# Patient Record
Sex: Male | Born: 1940 | Race: White | Hispanic: No | Marital: Married | Smoking: Former smoker
Health system: Southern US, Community
[De-identification: ages and names within clinical notes are randomized; demographics above are authoritative.]

## PROBLEM LIST (undated history)

## (undated) DIAGNOSIS — H5440 Blindness, one eye, unspecified eye: Secondary | ICD-10-CM

## (undated) DIAGNOSIS — R32 Unspecified urinary incontinence: Secondary | ICD-10-CM

## (undated) DIAGNOSIS — I639 Cerebral infarction, unspecified: Secondary | ICD-10-CM

## (undated) DIAGNOSIS — H269 Unspecified cataract: Secondary | ICD-10-CM

## (undated) DIAGNOSIS — H3530 Unspecified macular degeneration: Secondary | ICD-10-CM

## (undated) DIAGNOSIS — I701 Atherosclerosis of renal artery: Secondary | ICD-10-CM

## (undated) DIAGNOSIS — K219 Gastro-esophageal reflux disease without esophagitis: Secondary | ICD-10-CM

## (undated) DIAGNOSIS — I10 Essential (primary) hypertension: Secondary | ICD-10-CM

## (undated) DIAGNOSIS — C61 Malignant neoplasm of prostate: Secondary | ICD-10-CM

## (undated) DIAGNOSIS — I739 Peripheral vascular disease, unspecified: Secondary | ICD-10-CM

## (undated) DIAGNOSIS — H409 Unspecified glaucoma: Secondary | ICD-10-CM

## (undated) DIAGNOSIS — N189 Chronic kidney disease, unspecified: Secondary | ICD-10-CM

## (undated) DIAGNOSIS — N209 Urinary calculus, unspecified: Secondary | ICD-10-CM

## (undated) DIAGNOSIS — R011 Cardiac murmur, unspecified: Secondary | ICD-10-CM

## (undated) DIAGNOSIS — J449 Chronic obstructive pulmonary disease, unspecified: Secondary | ICD-10-CM

## (undated) DIAGNOSIS — Z923 Personal history of irradiation: Secondary | ICD-10-CM

## (undated) DIAGNOSIS — I671 Cerebral aneurysm, nonruptured: Principal | ICD-10-CM

## (undated) DIAGNOSIS — K635 Polyp of colon: Secondary | ICD-10-CM

## (undated) DIAGNOSIS — Z8614 Personal history of Methicillin resistant Staphylococcus aureus infection: Secondary | ICD-10-CM

## (undated) DIAGNOSIS — E7800 Pure hypercholesterolemia, unspecified: Secondary | ICD-10-CM

## (undated) DIAGNOSIS — D649 Anemia, unspecified: Secondary | ICD-10-CM

## (undated) DIAGNOSIS — I6529 Occlusion and stenosis of unspecified carotid artery: Secondary | ICD-10-CM

## (undated) DIAGNOSIS — I700 Atherosclerosis of aorta: Secondary | ICD-10-CM

## (undated) DIAGNOSIS — M1990 Unspecified osteoarthritis, unspecified site: Secondary | ICD-10-CM

## (undated) DIAGNOSIS — I708 Atherosclerosis of other arteries: Secondary | ICD-10-CM

## (undated) HISTORY — DX: Cerebral aneurysm, nonruptured: I67.1

## (undated) HISTORY — PX: PENILE PROSTHESIS  REMOVAL: SHX2202

## (undated) HISTORY — DX: Personal history of irradiation: Z92.3

## (undated) HISTORY — DX: Occlusion and stenosis of unspecified carotid artery: I65.29

## (undated) HISTORY — DX: Unspecified cataract: H26.9

## (undated) HISTORY — DX: Essential (primary) hypertension: I10

## (undated) HISTORY — PX: COLONOSCOPY W/ BIOPSIES: SHX1374

## (undated) HISTORY — DX: Pure hypercholesterolemia, unspecified: E78.00

## (undated) HISTORY — DX: Atherosclerosis of aorta: I70.0

## (undated) HISTORY — PX: PROSTATE BIOPSY: SHX241

## (undated) HISTORY — DX: Chronic obstructive pulmonary disease, unspecified: J44.9

## (undated) HISTORY — PX: OTHER SURGICAL HISTORY: SHX169

## (undated) HISTORY — PX: HERNIA REPAIR: SHX51

## (undated) HISTORY — DX: Polyp of colon: K63.5

## (undated) HISTORY — DX: Malignant neoplasm of prostate: C61

## (undated) HISTORY — PX: PENILE PROSTHESIS IMPLANT: SHX240

## (undated) HISTORY — PX: COLONOSCOPY: SHX174

## (undated) HISTORY — DX: Atherosclerosis of other arteries: I70.8

---

## 1997-07-02 ENCOUNTER — Emergency Department (HOSPITAL_COMMUNITY): Admission: EM | Admit: 1997-07-02 | Discharge: 1997-07-02 | Payer: Self-pay | Admitting: Emergency Medicine

## 2001-02-26 ENCOUNTER — Ambulatory Visit (HOSPITAL_COMMUNITY): Admission: RE | Admit: 2001-02-26 | Discharge: 2001-02-26 | Payer: Self-pay | Admitting: General Surgery

## 2002-01-30 DIAGNOSIS — C61 Malignant neoplasm of prostate: Secondary | ICD-10-CM

## 2002-01-30 HISTORY — DX: Malignant neoplasm of prostate: C61

## 2002-05-20 ENCOUNTER — Ambulatory Visit (HOSPITAL_COMMUNITY): Admission: RE | Admit: 2002-05-20 | Discharge: 2002-05-20 | Payer: Self-pay | Admitting: *Deleted

## 2002-05-20 ENCOUNTER — Encounter (INDEPENDENT_AMBULATORY_CARE_PROVIDER_SITE_OTHER): Payer: Self-pay | Admitting: *Deleted

## 2002-06-20 ENCOUNTER — Ambulatory Visit (HOSPITAL_COMMUNITY): Admission: RE | Admit: 2002-06-20 | Discharge: 2002-06-20 | Payer: Self-pay | Admitting: Urology

## 2002-06-20 ENCOUNTER — Encounter: Payer: Self-pay | Admitting: Urology

## 2002-07-07 ENCOUNTER — Encounter: Payer: Self-pay | Admitting: Urology

## 2002-07-15 ENCOUNTER — Inpatient Hospital Stay (HOSPITAL_COMMUNITY): Admission: RE | Admit: 2002-07-15 | Discharge: 2002-07-18 | Payer: Self-pay | Admitting: Urology

## 2002-07-15 ENCOUNTER — Encounter (INDEPENDENT_AMBULATORY_CARE_PROVIDER_SITE_OTHER): Payer: Self-pay | Admitting: Specialist

## 2002-07-15 HISTORY — PX: OTHER SURGICAL HISTORY: SHX169

## 2002-08-13 DIAGNOSIS — K635 Polyp of colon: Secondary | ICD-10-CM

## 2002-08-13 HISTORY — DX: Polyp of colon: K63.5

## 2002-10-10 ENCOUNTER — Observation Stay (HOSPITAL_COMMUNITY): Admission: RE | Admit: 2002-10-10 | Discharge: 2002-10-11 | Payer: Self-pay | Admitting: Urology

## 2002-10-10 ENCOUNTER — Encounter (INDEPENDENT_AMBULATORY_CARE_PROVIDER_SITE_OTHER): Payer: Self-pay | Admitting: *Deleted

## 2003-02-13 ENCOUNTER — Observation Stay (HOSPITAL_COMMUNITY): Admission: RE | Admit: 2003-02-13 | Discharge: 2003-02-14 | Payer: Self-pay | Admitting: Urology

## 2003-04-10 ENCOUNTER — Observation Stay (HOSPITAL_COMMUNITY): Admission: RE | Admit: 2003-04-10 | Discharge: 2003-04-11 | Payer: Self-pay | Admitting: Urology

## 2003-04-13 ENCOUNTER — Inpatient Hospital Stay (HOSPITAL_COMMUNITY): Admission: EM | Admit: 2003-04-13 | Discharge: 2003-04-25 | Payer: Self-pay | Admitting: *Deleted

## 2003-04-14 ENCOUNTER — Encounter (INDEPENDENT_AMBULATORY_CARE_PROVIDER_SITE_OTHER): Payer: Self-pay | Admitting: Specialist

## 2003-11-24 ENCOUNTER — Observation Stay (HOSPITAL_COMMUNITY): Admission: AD | Admit: 2003-11-24 | Discharge: 2003-11-25 | Payer: Self-pay | Admitting: Urology

## 2003-11-24 ENCOUNTER — Ambulatory Visit (HOSPITAL_BASED_OUTPATIENT_CLINIC_OR_DEPARTMENT_OTHER): Admission: RE | Admit: 2003-11-24 | Discharge: 2003-11-24 | Payer: Self-pay | Admitting: Urology

## 2003-12-15 ENCOUNTER — Inpatient Hospital Stay (HOSPITAL_COMMUNITY): Admission: EM | Admit: 2003-12-15 | Discharge: 2003-12-27 | Payer: Self-pay | Admitting: Emergency Medicine

## 2003-12-28 ENCOUNTER — Encounter (HOSPITAL_BASED_OUTPATIENT_CLINIC_OR_DEPARTMENT_OTHER): Admission: RE | Admit: 2003-12-28 | Discharge: 2004-01-01 | Payer: Self-pay | Admitting: Internal Medicine

## 2004-05-10 ENCOUNTER — Ambulatory Visit (HOSPITAL_COMMUNITY): Admission: RE | Admit: 2004-05-10 | Discharge: 2004-05-10 | Payer: Self-pay | Admitting: Urology

## 2004-05-10 ENCOUNTER — Ambulatory Visit (HOSPITAL_BASED_OUTPATIENT_CLINIC_OR_DEPARTMENT_OTHER): Admission: RE | Admit: 2004-05-10 | Discharge: 2004-05-10 | Payer: Self-pay | Admitting: Urology

## 2004-06-15 ENCOUNTER — Ambulatory Visit (HOSPITAL_COMMUNITY): Admission: RE | Admit: 2004-06-15 | Discharge: 2004-06-15 | Payer: Self-pay | Admitting: Urology

## 2004-06-15 ENCOUNTER — Ambulatory Visit (HOSPITAL_BASED_OUTPATIENT_CLINIC_OR_DEPARTMENT_OTHER): Admission: RE | Admit: 2004-06-15 | Discharge: 2004-06-15 | Payer: Self-pay | Admitting: Urology

## 2004-08-12 ENCOUNTER — Ambulatory Visit: Payer: Self-pay | Admitting: Internal Medicine

## 2004-09-06 ENCOUNTER — Ambulatory Visit: Payer: Self-pay | Admitting: Internal Medicine

## 2004-09-27 ENCOUNTER — Ambulatory Visit: Payer: Self-pay | Admitting: Internal Medicine

## 2005-08-25 ENCOUNTER — Encounter (INDEPENDENT_AMBULATORY_CARE_PROVIDER_SITE_OTHER): Payer: Self-pay | Admitting: *Deleted

## 2005-08-25 ENCOUNTER — Ambulatory Visit (HOSPITAL_COMMUNITY): Admission: RE | Admit: 2005-08-25 | Discharge: 2005-08-25 | Payer: Self-pay | Admitting: *Deleted

## 2005-10-25 ENCOUNTER — Encounter (INDEPENDENT_AMBULATORY_CARE_PROVIDER_SITE_OTHER): Payer: Self-pay | Admitting: Specialist

## 2005-10-25 ENCOUNTER — Ambulatory Visit (HOSPITAL_COMMUNITY): Admission: RE | Admit: 2005-10-25 | Discharge: 2005-10-25 | Payer: Self-pay | Admitting: *Deleted

## 2006-04-26 ENCOUNTER — Ambulatory Visit: Payer: Self-pay | Admitting: *Deleted

## 2006-08-02 ENCOUNTER — Encounter (HOSPITAL_COMMUNITY): Admission: RE | Admit: 2006-08-02 | Discharge: 2006-10-08 | Payer: Self-pay | Admitting: Urology

## 2006-10-22 ENCOUNTER — Ambulatory Visit: Payer: Self-pay | Admitting: Vascular Surgery

## 2007-04-18 ENCOUNTER — Ambulatory Visit: Payer: Self-pay | Admitting: *Deleted

## 2007-06-06 ENCOUNTER — Ambulatory Visit: Payer: Self-pay | Admitting: *Deleted

## 2007-09-20 ENCOUNTER — Ambulatory Visit (HOSPITAL_COMMUNITY): Admission: RE | Admit: 2007-09-20 | Discharge: 2007-09-20 | Payer: Self-pay | Admitting: *Deleted

## 2009-08-09 ENCOUNTER — Ambulatory Visit (HOSPITAL_COMMUNITY): Admission: RE | Admit: 2009-08-09 | Discharge: 2009-08-09 | Payer: Self-pay | Admitting: Internal Medicine

## 2009-09-07 ENCOUNTER — Ambulatory Visit: Payer: Self-pay | Admitting: Vascular Surgery

## 2010-02-23 HISTORY — PX: TRANSTHORACIC ECHOCARDIOGRAM: SHX275

## 2010-06-14 NOTE — Procedures (Signed)
CAROTID DUPLEX EXAM   INDICATION:  Followup of known carotid artery disease.  Patient has  known right ICA occlusion and known low flow state in left ICA.  He is  asymptomatic at this time.   HISTORY:  Diabetes:  No.  Cardiac:  No.  Hypertension:  Yes.  Smoking:  No.  Previous Surgery:  No.  CV History:  Amaurosis Fugax No, Paresthesias No, Hemiparesis No                                       RIGHT             LEFT  Brachial systolic pressure:         120               126  Brachial Doppler waveforms:         WNL               WNL  Vertebral direction of flow:        Antegrade         Antegrade  DUPLEX VELOCITIES (cm/sec)  CCA peak systolic                   50                74  ECA peak systolic                   72                57  ICA peak systolic                   Occluded          23  ICA end diastolic                   Occluded          7  PLAQUE MORPHOLOGY:                  Occluded          Mixed  PLAQUE AMOUNT:                      Occluded          Moderate-severe  PLAQUE LOCATION:                    ICA               Bifurcation, ICA   IMPRESSION:  1. Known right ICA occlusion.  2. Stable left ICA with known low flow state and low diastolic flow      consistent with distal occlusive arterial disease.  3. Patent ECAs.  4. Bilateral antegrade flow in vertebral arteries.  5. Study essentially unchanged from that on 10/22/2006.   ___________________________________________  P. Drucie Opitz, M.D.   PB/MEDQ  D:  04/18/2007  T:  04/18/2007  Job:  [id number]   cc:   Freeman Caldron. Pauline Aus, M.D.

## 2010-06-14 NOTE — Assessment & Plan Note (Signed)
OFFICE VISIT   Lance Dillon, Lance Dillon  DOB:  1940/07/19                                       09/07/2009  CHART#:[id number]   The patient was referred by Dr. Alyson Ingles for evaluation lower extremity  claudication symptoms.  He has been seen in the past by Dr. Amedeo Plenty, as  recently as 2 years ago, at which time his ABI was 0.91 on the right,  0.76 on the left.  He was  having no significant symptoms at that time.  Currently he has developed calf claudication symptoms primarily on the  right side which occur at about one block.  He is able to walk several  blocks before stopping.  He has no numbness, history of infection,  nonhealing ulcers or rest pain.  He states it is not particularly  limiting him at the present time and has minimal symptoms in the left  leg.   CHRONIC MEDICAL PROBLEMS:  1. Hypertension.  2. Hyperlipidemia.  3. Prostate cancer.  4. Known chronic right ICA occlusion, asymptomatic.  5. Negative for coronary artery disease, diabetes or stroke.   SOCIAL HISTORY:  He is married, has 2 children.  Has not smoked  cigarettes since 2004, does not use alcohol.   FAMILY HISTORY:  Positive for coronary artery disease in his mother,  stroke in his father.  Negative for diabetes.   REVIEW OF SYSTEMS:  Positive for joint pain, muscle pain, decrease in  visual acuity.  No chest pain, productive cough, asthma, wheezing.  No  neurologic symptoms.  All other systems are negative.   PHYSICAL EXAMINATION:  Blood pressure 137/82, heart rate 81, temperature  98.  General:  He is a well-developed, well-nourished male in no  apparent distress, alert and oriented x3.  HEENT:  Exam normal for age.  EOMs intact.  Lungs:  Clear to auscultation.  No rhonchi or wheezing.  Cardiovascular:  Regular rhythm, no murmurs.  No carotid bruits are  heard.  Abdomen:  Soft, nontender with no masses.  Musculoskeletal:  Free of major deformities.  Lower extremity exam reveals 3+  femoral  pulses bilaterally with a 1 to 2+ popliteal on the left.  No distal  pulses.  Right leg has absent popliteal and distal pulses.   Lower extremity arterial Dopplers were done at [facility] on June 29.  I have reviewed those.  ABI is 0.53 on the right  and 0.74 on the left, in the claudication range.   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  He does not have a limb loss situation at the present  time and his left leg is relatively asymptomatic.  I do not think any  further treatment should be done and I have encouraged him to walk as  much as possible help stimulate collateral flow.  He does have a known  right ICA occlusion and has not had his left carotid looked at in a  couple of years so I have scheduled that for 1 year from now unless he  develops any neurologic symptoms in the interim.     Nelda Severe Kellie Simmering, M.D.  Electronically Signed   JDL/MEDQ  D:  09/07/2009  T:  09/08/2009  Job:  [id number]

## 2010-06-14 NOTE — Procedures (Signed)
CAROTID DUPLEX EXAM   INDICATION:  Followup of known cerebrovascular disease.  He has a known  right ICA occlusion, which was indicated in March 2008.  The left ICA  has a known low flow state with possible distal occlusive disease.  He  is asymptomatic at this time.   HISTORY:  Diabetes:  No.  Cardiac:  No.  Hypertension:  Yes.  Smoking:  Quit 4 years ago.  Previous Surgery:  No.  CV History:  Amaurosis Fugax:  No, Paresthesias: No, Hemiparesis:  No.   INTERPRETING:  Dr. Amedeo Plenty.                                        RIGHT             LEFT  Brachial systolic pressure:         122               127  Brachial Doppler waveforms:         Within normal limits                Within normal limits  Vertebral direction of flow:        Antegrade         Antegrade  DUPLEX VELOCITIES (cm/sec)  CCA peak systolic                   56                63  ECA peak systolic                   87                45  ICA peak systolic                   Occluded          18  ICA end diastolic                   Occluded          5  PLAQUE MORPHOLOGY:                  Occlusion         Mixed  PLAQUE AMOUNT:                      Occlusion         Moderate-severe  PLAQUE LOCATION:                    ICA               Bifurcation/ICA   IMPRESSION:  Known right occluded internal carotid artery.  Stable left  internal carotid artery with know low flow state as well as low end-  diastolic flow, consistent with distal occlusive disease.  Bilateral  vertebral arteries demonstrate antegrade flow.  Bilateral external  carotid arteries within normal limits.   ___________________________________________  P. Cameron Sprang, M.D.   AR/MEDQ  D:  10/22/2006  T:  10/23/2006  Job:  [id number]

## 2010-06-14 NOTE — Assessment & Plan Note (Signed)
OFFICE VISIT   ARN, TREIBER  DOB:  1940-02-04                                       06/06/2007  CHART#:[id number]   The patient is seen today because of an abnormal lower extremity  arterial Doppler.  His ankle brachial indices measured 0.91 on the  right, 0.76 on the left.   On specific questioning the patient denies any claudication type  symptoms.  He is able to walk as much as desired.  Does occasionally get  some stumbling and discomfort which he attributes to his back.  No  history of night pain or rest pain.   His BP is 148/80, pulse 89 per minute, respirations 18 per minute.  His  lower extremities on the right reveal 1 to 2+ dorsalis pedis and  posterior tibial.  Left revealed no palpable dorsalis pedis or posterior  tibial but a well-perfused foot.   The patient is essentially asymptomatic from a vascular point of view  regarding his peripheral vascular disease.  This is mild on the right  and mild to moderate on the left.  No further workup for intervention  required at this time.   Dorothea Glassman, M.D.  Electronically Signed   PGH/MEDQ  D:  06/06/2007  T:  06/07/2007  Job:  [id number]   cc:   Freeman Caldron. Pauline Aus, M.D.

## 2010-06-14 NOTE — Op Note (Signed)
Lance Dillon, Lance Dillon                ACCOUNT NO.:  [id number]   MEDICAL RECORD NO.:  [id number]          PATIENT TYPE:  AMB   LOCATION:  ENDO                         FACILITY:  [facility]   PHYSICIAN:  Waverly Ferrari, M.D.    DATE OF BIRTH:  1940-04-08   DATE OF PROCEDURE:  09/20/2007  DATE OF DISCHARGE:                               OPERATIVE REPORT   PROCEDURE:  Colonoscopy.   INDICATIONS:  Colon polyps.   ANESTHESIA:  Fentanyl 50 mcg, Versed 5 mg.   PROCEDURE IN DETAIL:  With the patient mildly sedated in the left  lateral decubitus position a rectal examination was performed.  Prostate  was not present.  Subsequently the Pentax videoscopic colonoscope was  inserted in the rectum and passed under direct vision to the cecum,  identified by ileocecal valve and appendiceal orifice, both of which  were photographed.  From this point the colonoscope was slowly withdrawn  taking circumferential views of colonic mucosa stopping only in the  rectum which appeared normal on direct and showed hemorrhoids on  retroflexed view.  The endoscope was straightened and withdrawn.  The  patient's vital signs, pulse oximeter remained stable.  The patient  tolerated the procedure well without apparent complications.   FINDINGS:  Internal hemorrhoids otherwise unremarkable examination.   PLAN:  Repeat examination in 5 years.           ______________________________  Waverly Ferrari, M.D.     GMO/MEDQ  D:  09/20/2007  T:  09/20/2007  Job:  [id number]

## 2010-06-17 NOTE — Discharge Summary (Signed)
   NAME:  Lance Dillon, Lance Dillon                          ACCOUNT NO.:  [id number]   MEDICAL RECORD NO.:  [id number]                   PATIENT TYPE:  INP   LOCATION:  [id number]                                 FACILITY:  [facility]   PHYSICIAN:  Domingo Pulse, M.D.               DATE OF BIRTH:  [birth year]   DATE OF ADMISSION:  07/15/2002  DATE OF DISCHARGE:  07/18/2002                                 DISCHARGE SUMMARY   DISCHARGE DIAGNOSES:  1. Carcinoma of prostate.  2. Chronic obstructive pulmonary disease.  3. Left inguinal hernia.  4. Hypertension.  5. Macular degeneration.  6. History of tobacco use.   HISTORY:  This 70 year old male developed an elevated PSA and was found to  have a Gleason score of 7, involving 78% of the prostate on the right and a  Gleason score of 7, involving 40% of the biopsy on the left.  He also was  found to have a left inguinal hernia.  CT scan and bone scan were negative.  Patient was advised as to the possible benefits of surgery versus radiation  and elected to undergo a radical prostatectomy; he was admitted following  that procedure.  At the time of surgery, he also underwent a left inguinal  repair through a retropubic approach.   HISTORIES:  The patient's family history, social history, and review of  systems are all carefully __________ in the initial history and physical.   HOSPITAL COURSE:  The patient's postoperative course was unremarkable.  He  tolerated the procedure without any difficulty.  His postoperative  hematocrit was 27.4.  Electrolytes were all normal.  He had very minimal J-P  drainage.  He had fairly prompt return of his abdominal function.  At the  time of discharge, the patient's J-P drain was pulled back, and that did  really very nicely stop the drainage.  He was sent home with the J-P drain  in place as well as a Foley catheter and will return to see me in the office  in one week's time for staple removal.  Additionally, he was sent  home with  pain medications as well as antibiotic coverage.                                               Domingo Pulse, M.D.    RJE/MEDQ  D:  07/30/2002  T:  07/30/2002  Job:  [id number]   cc:   Freeman Caldron. Pauline Aus, M.D.  [address] [place]  [place] [address]  Fax: [place] Melvyn Novas, M.D. [facility]

## 2010-06-17 NOTE — Op Note (Signed)
[facility]  Patient:    Lance Dillon, Lance Dillon Visit Number: [id number] MRN: [id number]          Service Type: DSU Location: DAY Attending Physician:  Maxwell Marion Dictated by:   Lew Dawes Rosana Hoes, M.D. Proc. Date: 02/26/01 Admit Date:  02/26/2001                             Operative Report  PREOPERATIVE DIAGNOSIS:  Left inguinal hernia.  POSTOPERATIVE DIAGNOSIS:  Left direct inguinal hernia.  PROCEDURE:  Repair of left direct inguinal hernia with mesh.  SURGEON:  Timothy E. Rosana Hoes, M.D.  ASSISTANT:  Mr. Hinton Rao.  ANESTHESIA:  General.  INDICATION:  Mr. Sistare is 40 healthy, hard working, has developed an increasingly symptomatic left inguinal hernia, and he wishes to have a repair after careful discussion.  DESCRIPTION OF PROCEDURE:  He was evaluated by anesthesia, taken to the operating room and placed supine, general endotracheal anesthesia administered.  The left groin had been shaved, and it was now prepped and draped in the usual fashion.  Marcaine 0.25% with epinephrine was injected around and about the area prior incision and dissection.  A curvilinear incision made over the left groin, subcutaneous tissue dissected, bleeding points cauterized, external oblique identified, dissected free, opened in line with its fibers through the external ring, care taken to reflect the ilioinguinal nerve medially.  The cord structures were dissected from the floor of the canal, skeletonized, and no indirect component was seen.  The direct hernia was large.  The floor was redundant and weak.  This was dissected from the cord structures.  A piece of Prolene mesh was cut and fashioned to fit the floor of the canal and sewn in place with a 2-0 running Prolene up to and around the internal ring.  This completed the repair.  The cord structures exited through the mesh and were otherwise anatomic.  They were re-placed in their normal position.  The  external oblique was closed with a running 2-0 Vicryl.  The deep subcu closed with 3-0 Monocryl and the skin closed with 3-0 Monocryl subcuticular.  Steri-Strips applied.  Dry sterile dressing applied.  Sponge, sharp, and instrument counts correct.  He tolerated it well and was removed to the recovery room.  Written and verbal instructions were given him and his wife, and he will be seen and followed as an outpatient. Dictated by:   Lew Dawes Rosana Hoes, M.D. Attending Physician:  Maxwell Marion DD:  02/26/01 TD:  02/26/01 Job: [id number]

## 2010-06-17 NOTE — Op Note (Signed)
Lance, Dillon                ACCOUNT NO.:  [id number]   MEDICAL RECORD NO.:  [id number]          PATIENT TYPE:  AMB   LOCATION:  [facility]                         FACILITY:  [facility]   PHYSICIAN:  Domingo Pulse, M.D.  DATE OF BIRTH:  07-05-1940   DATE OF PROCEDURE:  11/24/2003  DATE OF DISCHARGE:                                 OPERATIVE REPORT   PREOPERATIVE DIAGNOSIS:  Malfunction of penile prosthesis; urinary  incontinence, status post placement of an artificial urinary sphincter.   POSTOPERATIVE DIAGNOSIS:  Malfunction of penile prosthesis; urinary  incontinence, status post placement of an artificial urinary sphincter.   PROCEDURE:  1.  Revision of artificial urinary sphincter.  2.  Replacement of penile prosthesis reservoir.   ATTENDING SURGEON:  Alona Bene, MD   RESIDENT SURGEON:  Westley Foots, MD   ANESTHESIA:  General endotracheal anesthesia.   COMPLICATIONS:  None.   INDICATIONS FOR PROCEDURE:  [name] is a 70 year old male, status post  placement of a three-piece penile prosthesis in the recent past as well as  placement of an artificial urinary sphincter.  Following placement of his  prosthesis, the patient had a bowel herniation with incarcerated intestine  requiring reoperation.  At the time of reoperation, his reservoir was  removed.  In addition, the patient had an artificial urinary sphincter  placed; however, he has continued to leak intermittently.  [name]  presents for revision of his urinary sphincter as well as replacement of the  penile prosthesis reservoir.   DESCRIPTION OF THE PROCEDURE IN DETAIL:  The patient was brought to the  operating room and following induction of general endotracheal anesthesia,  was placed in the dorsal lithotomy position and prepped and draped in the  usual sterile fashion.  A lower midline abdominal incision was performed and  the dissection carried down into the space of Retzius.  Once the space of  Retzius had  been entered, the tubing from the previously placed prosthesis  reservoir was identified and dissected free of all surrounding tissues.  A  rubber-shodded hemostat was subsequently placed on the tubing and the cut  portion of the tubing from the previous surgery removed.  At this point, the  tubing from the previously placed artificial urinary sphincter was  identified in the retropubic space to the left of the prosthesis tubing.  This tubing was followed down until the pump for the artificial urinary  sphincter was identified and brought into the suprapubic incision.  At this  point in the operation, dissection was carried down on the left side of the  wound to the level of the fascia and the fascia opened.  A new pocket was  created in the left lower quadrant for placement of a new prosthesis  reservoir.  A 65 mL reservoir was subsequently placed into the space of  Retzius and the fascia closed over the reservoir using a running 2-0 Vicryl  suture.  At this point, the suprapubic wound was packed with a lap sponge,  and attention was turned to the perineum.  A midline perineal incision was  made and  dissection carried down to the level of the bulbar urethra and the  previously placed artificial urinary sphincter.  The sphincter was  identified and repositioned slightly more distal than where it had  originally been placed.  The urethra proximal to the old sphincter was  subsequently dissected away from the overlying corporal bodies, and a second  4 cm artificial urinary sphincter was placed around the bulbar urethra.  The  tubing from this new sphincter was tunneled up through the subcutaneous  tissues and brought out into the suprapubic wound.  A test of the urethra  was made by injecting sterile irrigation, retrograde through the urethra to  rule out any leaks.  This test was negative.  At this point, the fascia was  closed over the posterior urethra with a running 2-0 Vicryl suture, and  the  skin reapproximated with a running 2-0 Vicryl suture.  Attention was again  turned to the suprapubic incision.  The lap sponge was removed and all  tubing tested and trimmed for the final connections.  The new reservoir  tubing was connected to the previously placed tubing for the penile  prosthesis.  The two pieces of tubing coming from the two separate  artificial urinary sphincters were then connected to the sphincter pump via  a Y connector.  Once all tubing had been connected and both prosthetic  devices tested, the fascia in the suprapubic incision was reapproximated  using a running 2-0 Vicryl suture and the skin reapproximated using staples.  At this point, the wounds were dressed and the patient allowed to awaken.  The patient tolerated the procedure well, and there no complications.  Please note that Dr. Alona Bene was present for the entire case and  participated in all aspects of the procedure.      JP/MEDQ  D:  11/24/2003  T:  11/24/2003  Job:  [id number]

## 2010-06-17 NOTE — Discharge Summary (Signed)
Lance Dillon, Lance Dillon                ACCOUNT NO.:  [id number]   MEDICAL RECORD NO.:  [id number]          PATIENT TYPE:  INP   LOCATION:  [id number]                         FACILITY:  [facility]   PHYSICIAN:  Aquilla Hacker, M.D. DATE OF BIRTH:  08/29/1940   DATE OF ADMISSION:  12/15/2003  DATE OF DISCHARGE:  12/27/2003                                 DISCHARGE SUMMARY   FINAL DIAGNOSES:  1.  Sepsis secondary to infected penile prostheses and infected artificial      genitourinary sphincter.  2.  Abscess with MSSA.  3.  Urinary tract infection with MSSA and Enterococcus.   PROCEDURES:  Removal of infected penile prostheses and artificial  genitourinary sphincter.   CHIEF COMPLAINT:  Fever, nausea, and emesis.   HISTORY OF PRESENT ILLNESS:  Lance Dillon is a 70 year old gentleman with a  past medical history of hypertension, COPD, radical prostatectomy, and  penile prostheses placement.  He was seen earlier in the day by his primary  care physician.  He was referred to the emergency room after it was  discovered in his primary care physician's office that he was febrile and  had leukocytosis.  He had complained of nausea and emesis which began the  previous day.  He denied having any other complaints at the time of  admission.   PAST MEDICAL HISTORY:  1.  Hypertension.  2.  COPD.   PAST SURGICAL HISTORY:  1.  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  2.  Insertion of an AMS 800 artificial GO sphincter and 700 CX penile      prostheses in March 2005.  3.  Incarcerated right inguinal hernia repair April 14, 2003.  4.  Repair of malfunction of the penile prostheses, urinary incontinence,      placement of artificial urinary sphincter November 24, 2003.   FAMILY HISTORY:  Positive for hypertension, CVA in the father.   SOCIAL HISTORY:  Cigarettes positive.  The patient stopped one year ago.  Alcohol:  The patient denies.  Street drugs:  The patient denies.   HOSPITAL COURSE:  #1 - FEVER:  The patient was admitted into the hospital  and placed on broad-spectrum antibiotics.  Initially he had some decrease in  his fever.  However, his spikes went up as high as 102 degrees.  The patient  was evaluated by Dr. Amalia Hailey in the emergency room who stated that he noted  that the patient's scrotal area appeared to be warm, but not fluctuant and  initially it was difficult to tell whether or not the patient had a true  abscess.  However, the patient did appear to have pustules in his legs and  arms etiology of which was not clear.  A CT scan was obtained which showed  that the patient had a 59 x 32 mm fluid collection anterior to the left  pubic bone.  Gas bubbles are concerning for an abscess.  The fluid was noted  to extend into the left scrotum which may be infected or reactive fluid.  There was also noted that there was no bowel obstruction or  free fluid in  the pelvis.  The patient became more toxic appearing requiring admission  into the intensive care unit.  Urology decided that the patient needed to  have his device removed and the abscess needed to be drained and cultures  sent.  Urology went on to state that given the patient's overall degree of  illness in their opinion the best option would be to remove the patient's  device and treat his erectile dysfunction and incontinence at a later date.  The patient's operation took place on December 17, 2003.  It was noted that  by the day following the operation the patient appeared to be less toxic,  although he still remained febrile.  He was placed on Zosyn and IV  vancomycin over the course of his hospitalization.  Cultures from his  surgical wound were sent off to the laboratory and they came back positive  for gram-positive cocci in pairs.  No anaerobes were isolated.  Likewise,  the patient had blood cultures sent off all of which were negative.  On  October 15 he had also a urine culture sent off  which came back positive for  Staphylococcus aureus and also for Enterococcus both of which were both  sensitive to vancomycin.  The following day as the patient's clinical  condition appeared to improve, it was noted by November 20 that the  patient's wound site to be looking better and that there was less drainage  from his wound site.  However, by the 20th the patient's liver enzymes were  noted also to have started to increase.  There was a question as to whether  or not Zosyn contributed to that increase in the liver function tests.  Therefore, the decision was made to discontinue Zosyn.  On November 21  urology noted that the patient's wound appeared to be dirty.  All the drains  were removed by the time and the wound had to be reopened.  As the patient's  condition continued to improve urology recommended that the patient start  whirlpool once a day and by November 22 it was stated by urology that the  patient was okay to be discharged home.  There appeared to be a problem with  regards to arrange the whirlpool and patient ended up receiving pulse  lavage.  During the patient's hospitalization he was noted to have what  appeared to be a fungal infection within his groin/intertriginous area and  Diflucan 100 mg IV q.24h. was subsequently started.  Likewise, when the  Zosyn was discontinued clindamycin 300 mg IV q.8h. was initiated.  By the  time of discharge the patient's groin was noted to be doing well and it was  noted by the urologist that this healing/wound site was granulating well.  The second issue was diarrhea.  Later during the course of the patient's  hospitalization he complained of diarrhea.  There was some concern that  because the patient was on antibiotics he may have developed C. difficile.  Stool was sent off for studies and it was negative for C. difficile.  It was  also negative for white blood cells and the Gram stain was reported as being negative.  By the time  the patient was discharged he stated that his  diarrhea had resolved.   #2 - CHRONIC OBSTRUCTIVE PULMONARY DISEASE:  This remained stable throughout  the course of the patient's hospitalization.  He received breathing  treatments p.r.n., however.   #3 - MALNUTRITION:  It was noted  that the patient had very little appetite  throughout the course of his hospitalization.  He was encouraged to increase  his p.o. intake.   #4 - URINARY TRACT INFECTION:  Again, the patient's urine sample sent off on  November 15 was reported on November 18 to have grown Staph aureus and  Enterococcus species.  He was started on IV antibiotics and the day prior to  his discharge a repeat UA was sent to the laboratory for analysis the final  results of which were no wbc's were seen, no squamous epithelial cells seen,  no organisms were seen and that was the final report.  Likewise, the  patient's nitrites and leukocytes were negative.   The decision was made to discharge the patient home.  The patient's  condition at the time of discharge was significantly improved.  At the time  of his discharge he had no complaints and stated that he was ready to go  home.  His vitals at the time of discharge, his temperature was 98.0, heart  rate 82, respirations 16, blood pressure 129/91, SPO2 96 on room air.  His  genitourinary examination consisted of observing his suprapubic surgical  wound site which appeared to be clean.  There did not appear to be any  purulent material/discharge coming from that area.  Likewise, there was no  erythema present.  His wound site was packed in gauze, however.  Likewise,  his intertriginous region appeared to be clean and there was no obvious sign  of a yeast infection at that time.  Decision was made to discharge the  patient home.  The patient was discharged home on the following medications.   DISCHARGE MEDICATIONS:  1.  Xopenex 0.63 mg MDI two puffs q.6h.  2.  Levofloxacin 750 mg  p.o. daily.  3.  Multivitamin one tablet daily.  4.  Norvasc 5 mg one tablet daily.  5.  Diflucan 100 mg one tablet p.o. daily.  6.  Atrovent 0.5 mg MDI two puffs q.6h.  7.  Altace 10 mg one tablet p.o. daily.  8.  Ambien 10 mg one tablet p.o. q.h.s. p.r.n. for sleeping.  9.  Phenergan 12.5 mg one tablet p.o. q.6h.  10. Darvocet 100/650 one tablet p.o. q.4h. p.r.n. for pain.  11. Dilaudid 2 mg one tablet q.6h. p.r.n. for pain.   The patient was instructed to schedule an appointment to see Dr. Amalia Hailey on  Monday, December 28, 2003 for follow-up evaluation.  He was also instructed  to call to make this appointment.  In addition, he was instructed to  schedule an appointment to see his primary care physician, Dr. Concepcion Elk  within one to two weeks for follow-up evaluation.  He was told to take all  of his medications as prescribed, to keep his wound site clean, and to make  sure that he drinks plenty of fluids and liquids.     Orla  OR/MEDQ  D:  12/27/2003  T:  12/27/2003  Job:  [id number]   cc:   Freeman Caldron. Pauline Aus, M.D.  [address] [place]  [place] [address]  Fax: [phone]   Domingo Pulse, M.D.  [address] [place]  [place] [address]  Fax: [phone]

## 2010-06-17 NOTE — H&P (Signed)
Lance Dillon, Lance Dillon                ACCOUNT NO.:  [id number]   MEDICAL RECORD NO.:  [id number]          PATIENT TYPE:  INP   LOCATION:  0101                         FACILITY:  [facility]   PHYSICIAN:  Felicita Gage, MD     DATE OF BIRTH:  06/10/1940   DATE OF ADMISSION:  12/15/2003  DATE OF DISCHARGE:                                HISTORY & PHYSICAL   PRIMARY CARE PHYSICIAN:  Freeman Caldron. Pauline Aus, M.D.   CHIEF COMPLAINT:  Fever, nausea and vomiting X1 day.   HISTORY OF PRESENT ILLNESS:  The patient is a 70 year old Caucasian  gentleman transferred from his primary care physician's office after being  seen for fever, found to have leukocytosis.  The patient had nausea and  vomiting that started yesterday evening about 6 P.M.  One episode of  vomiting brown colored vomiting with no blood in the vomitus.  The patient  has had several surgeries this year starting with a cystoscopy and  transurethral resection of bladder neck contracture in January of 2005  followed by insertion of an AMS 800 artificial Geo sphincter and 700 CX  penile prosthesis in March of 2005.  The patient also had an incarcerated  right inguinal hernia repair on April 14, 2003 and was discharged on May 08, 2003.  The patient also had a urological surgical procedure on November 24, 2003 by Dr. Amalia Hailey for a malfunction of the penile prosthesis, urinary  continence, status post placement of artificial urinary sphincter.  The  patient denies any other symptoms other than the fever, nausea and vomiting.  He was concerned that it may be an infectious process secondary to his  multiple surgeries.  The patient had a follow up appointment with Dr. Amalia Hailey  later this week.   The patient did not have any episodes of vomiting this morning.  The patient  was able to tolerate p.o. medications in the emergency department.   PAST MEDICAL HISTORY:  1.  Hypertension.  2.  Chronic obstructive pulmonary disease.  3.  Status post radical  prostatectomy.  4.  Status post incarcerated hernia repair.  5.  Status post penile prosthesis placement.   FAMILY HISTORY:  Significant for hypertension, cerebrovascular accident in  father.   SOCIAL HISTORY:  Patient former smoker, quit one year ago.  No alcohol or  drug use.   MEDICATIONS:  1.  Caduet 10/20 one tab p.o. daily.  2.  Altace 10 mg p.o. daily.  3.  Fish oil.  4.  Multivitamin.  5.  Calcium supplement.   REVIEW OF SYMPTOMS:  Other than history of present illness the patient  denies any headaches, changes in vision, dysphagia, shortness of breath,  chest pain, abdominal pain, or any pain in his extremities.   PHYSICAL EXAMINATION:  VITAL SIGNS:  Temperature on admission 102.7,  subsequently 100.0, pulse 111, respirations 21, blood pressure 135/77.  GENERAL:  Elderly Caucasian man lying in bed in no acute distress.  Patient  was asleep, easily arousable.  Patient is alert and oriented X3.  Cooperative with the examination.  HEENT:  Normocephalic, atraumatic.  Pupils equal, round, reactive to light.  NECK:  Supple.  No lymphadenopathy, no jugular venous distention.  Oropharynx is noninflammatory.  LUNGS:  Clear to auscultation bilaterally.  No wheezes and no rales.  CVS:  S1, +S2, tachycardic.  No murmurs, rubs or gallops.  ABDOMEN:  Positive bowel sounds, soft, nontender, no masses, midline  surgical scar below the umbilicus, healing well.  GENITOURINARY:  Evidence of surgery on the scrotum.  No obvious signs of  infection along the surgical scar.  EXTREMITIES:  No cyanosis, clubbing or edema.  NEUROLOGICAL:  Nonfocal.   LABORATORY DATA:  Labs from Dr. Gertie Gowda office white blood cell count 19.6,  hemoglobin 12.8, hematocrit 36.4, platelet count 303,000 with neutrophils  87%, lymphs 5.3. BUN 26, creatinine 1.3, total bilirubin 1.6, alkaline  phosphatase 60, SGOT 27, SGPT 20.  Sodium 136, potassium 3.6, chloride 104,  cO2 27, calcium 9.1, total protein 6.1.    Chest x-ray is pending.   ASSESSMENT/PLAN:  70 year old Caucasian gentleman with history of multiple  genitourinary surgeries, transferred from primary care physician's office  with fever, leukocytosis, nausea and vomiting x1 day.   The patient will be admitted to the floor.   PROBLEM #1:  FEVER, LEUKOCYTOSIS:  Unclear etiology.  The patient will be  given Tylenol 650 mg p.o. q.4h. PRN fever.  The patient is also empirically  started on Zosyn and 3.375 mg intravenous q.6h.  (Patient is ALLERGIC TO  AVELOX).  CBC's will be repeated in the A.M.  Blood cultures X2 are pending.  Urinalysis is negative.  Urine cultures are pending.   PROBLEM #2:  HYPERTENSION:  The patient's Caduet and Altace have been  resumed.   PROBLEM #3:  HISTORY OF RECENT PROSTATE SURGERY:  Dr. Alona Bene of  Urology has been notified and will evaluate the patient this evening and  will give his recommendations.   PROBLEM #4:  NAUSEA AND VOMITING:  The patient is on Phenergan PRN for  nausea and vomiting.  He will be started on a clear liquid diet which will  be advanced as tolerated.   PROBLEM #5:  PROPHYLAXIS:  Lovenox 40 mg subcutaneously daily, Protonix 40  mg intravenously daily.   DISPOSITION:  After resolution of acute issues the patient will be  discharged with follow up with Dr. Concepcion Elk.      GDK/MEDQ  D:  12/15/2003  T:  12/15/2003  Job:  [id number]

## 2010-06-17 NOTE — Op Note (Signed)
NAMEERICSON, EBRON                ACCOUNT NO.:  [id number]   MEDICAL RECORD NO.:  [id number]          PATIENT TYPE:  INP   LOCATION:  0158                         [facility]AC[facility]  [facility]   PHYSICIAN:  Domingo Pulse, M.D.  DATE OF BIRTH:  13-May-1940   DATE OF PROCEDURE:  12/17/2003  DATE OF DISCHARGE:                                 OPERATIVE REPORT   PREOPERATIVE DIAGNOSIS:  Sepsis secondary to infected penile prosthesis and  infected artificial genitourinary sphincter.   POSTOPERATIVE DIAGNOSIS:  Sepsis secondary to infected penile prosthesis and  infected artificial genitourinary sphincter.   PROCEDURE:  Removal of infected penile prosthesis and artificial GU  sphincter.   SURGEON:  Domingo Pulse, M.D.   ASSISTANTMarland Kitchen  Hardin Negus.   ANESTHESIA:  General anesthesia.   COMPLICATIONS:  None.   DRAIN:  Multiple Penrose drains.   HISTORY:  This 70 year old male underwent radical prostatectomy with good  results.  The patient had 0 PSA.  It was noted preoperatively that the  patient had erectile dysfunction unresponsive to oral therapy and the long-  term plan was to insert a penile prosthesis.  When it was noted in the  postoperative period that the patient had some mild stress incontinence that  was somewhat bothersome to him, it was felt that he could undergo placement  of an artificial sphincter at the time of the implantation of the penile  prosthesis.   The patient underwent successful implantation of both devices through a  penis scrotal incision.  The patient had the reservoir for the artificial  sphincter on the left and the reservoir for the penile prosthesis on the  right.  He stayed in the hospital for 23 hours, was discharged the following  morning by the on call physician.  Over that weekend, the patient developed  some problems with nausea and presented to the office on the following  Monday.  He was found to have a bulge in the right groin.  This was thought  to  possibly be erosion of the reservoir through the inguinal ring.  The  patient was admitted to the hospital but began to feel worse.  A scan of  that area was performed and it was clear that this was not the reservoir but  was a strangulated hernia.  General surgery consultation was obtained and  the patient was taken to the operating room.   At the time of the surgery, it was clear that the patient had developed an  acutely strangulated hernia secondary to nausea and vomiting and needed to  have a bowel resection and removal of the reservoir for the IPP.  He  subsequently had the artificial sphincter activated which worked  [employer].   After complete resolution of that problem, the patient asked that the IPP be  turned back on and he was taken back to the operating room about two weeks  ago where he implantation of a new reservoir and at the same time had some  revision work to the artificial GU sphincter.  A second cuff was placed  around the urethra to improve his  continence control.   The patient received the usual preoperative and postoperative antibiotics  and appeared to have an uneventful course.  He went home and had very little  swelling and within a week to 10 days, was actually able to go out and play  golf without any complication, problems or pain.   Earlier this week, the patient presented to his primary care physician.  Dr.  Pauline Aus was unavailable so the patient was seen, in his absence, by Dr. Gareth Eagle.  The patient complained of the acute onset of fever.  He really did  not describe pain or swelling in the operative area but noted that he felt  quite ill and was febrile.  He was sent to the emergency room where he was  admitted by the Hospitalists.  I saw the patient in the emergency room and  felt that he perhaps had a little bit of swelling but really had a difficult  time getting the patient to not whether he had pain or not in the scrotal  area.  The area  was somewhat warm but not fluctuant and it was difficult to  determine if there was a true abscess in that area because the patient was  quite warm throughout his entire body.  What was remarkable on the  examination was the fact that he had pustules noted in the legs and arms  almost suggestive of an impetigo and it really was not at all clear to me  whether this was an infection in the device that spread throughout the body  or if perhaps the patient had developed an infection in the skin from some  other source that had been spread to the prostheses.   The patient was admitted and placed on  broad spectrum antibiotics.  He had  some decrease in his fever but still had temperature spikes as high as 102  degrees.  His white count was elevated at 14.5.  A CT scan was obtained  which showed gas in the tissue planes of the left side of the groin and it  was felt that the patient would require removal of the device.  This was  discussed with the patient and he reluctantly agreed.   The patient became more toxic appearing today and it is now felt that he  will require intensive care unit observation as well as a central line.  He  is to undergo removal of the device with drainage and culture.  He will not  have an attempt at salvage of the device which might be possible if there  was simple cellulitis but given his overall degree of illness, it is felt  that the best option is to remove the device and treat his erectile  dysfunction and incontinence at a later date.  The patient and his wife  understand the risks and benefits of the procedure and gave full improved  informed consent.   DESCRIPTION OF PROCEDURE:  After successful induction of general anesthesia,  the patient was placed in the dorsal lithotomy position, prepped with  Betadine and draped in the usual sterile fashion.  The lower abdominal incision was opened with electrocautery and frank purulent material was  obtained.  This  was cultured for aerobes and anaerobes.  The tubing from the  artificial GU sphincter was identified first.  This was traced back to the  reservoir which was then removed.  The connections to the two sphincters  were identified and the pump mechanism was  identified and elevated out of  the left hemiscrotum.  The tubing to the two cuffs in the perineum were  individually cut.  The penile prosthesis had been placed through a penile  scrotal incision so an incision was made on the underside to allow access to  the tubing.  Electrocautery was used to cut down on the tubing until the  corporal  bodies were identified.  The penile tubes and the rear tip  extenders were removed as was the pump.  The tubing up to the reservoir was  cut.  The tubing was then dissected out of the retroperitoneal and removed.  Attention was then directed to the perineum.  The two cuffs were identified.  The tablets were cut and the sphincters were removed.  The urethra had not  been injured in any way.  All three incisions were then carefully irrigated.  The skin was loosely approximated on each one with Vicryl sutures.  The  patient had Penrose drains placed in each of the reservoir spots  as well as in the pump areas and all the wounds were then packed with  Iodoform gauze.  The patient's Foley catheter was left to straight drain.  He was given scrotal support and fluffs.  He tolerated the procedure and was  taken to the recovery room in good condition.      RJE/MEDQ  D:  12/17/2003  T:  12/17/2003  Job:  [id number]   cc:   Freeman Caldron. Pauline Aus, M.D.  [address] [place]  [place] [address]  Fax: [place] Melvyn Novas, M.D. [facility]

## 2010-06-17 NOTE — Op Note (Signed)
NAMEANIS, Lance                ACCOUNT NO.:  [id number]   MEDICAL RECORD NO.:  [id number]          PATIENT TYPE:  AMB   LOCATION:  [facility]                         FACILITY:  [facility]   PHYSICIAN:  Domingo Pulse, M.D.  DATE OF BIRTH:  1940-09-23   DATE OF PROCEDURE:  DATE OF DISCHARGE:                                 OPERATIVE REPORT   PREOPERATIVE DIAGNOSES:  1.  Stress urinary incontinence.  2.  Status post radical retropubic prostatectomy.   POSTOPERATIVE DIAGNOSES:  1.  Stress urinary incontinence.  2.  Status post radical retropubic prostatectomy.   PROCEDURE:  1.  Cystoscopy.  2.  __________ injection.   SURGEON:  Domingo Pulse, M.D.   ANESTHESIA:  General.   COMPLICATIONS:  None.   DRAINS:  None.   BRIEF HISTORY:  This 70 year old male had a successful radical retropubic  prostatectomy insofar as cancer control is concerned, but did develop  increasing issues with stress incontinence.  In the early postoperative  period the patient had excellent control, but as time went on this got  worse.  Decision was eventually made to place penile prosthesis and an  artificial sphincter.  It should be noted that the patient had preoperative  erectile dysfunction.   The patient had an exceptionally complicated postoperative course.  Several  days postop he developed nausea and ended up developing an acute  incarcerated hernia.  This hernia involved the reservoir of the penile  prosthesis necessitating removal of the reservoir at the time of the  correction of the hernia.  The patient did strangulate and had a piece of  small bowel removed.  He fully recovered from this and eventually had a  reservoir replaced with what appeared to be good results.  The GU sphincter  had been left along and did function nicely during this period.   Several weeks postoperatively the patient developed an unusual skin rash  with purulent lesions across the legs and arms. He presented to the  emergency room with a fever and pain.  At first it did not appear that there  was any associated infection within the penile prosthesis, but as the  patient became more and more ill, it was clear that he likely had developed  a secondary infection in the prosthesis.  It turns out that this was quite  possibly MRSA and that the patient may have seated the prosthesis when he  developed this widespread infection in the legs and arms.  He had to have  both implants removed and was then successfully treated with antibiotic  therapy.  His incisions healed by second intent.   The patient has had a return of his stress incontinence.  He is clearly  concerned about this and he is also very worried about having a GU sphincter  placed.  He realizes that it could become infected either at the time of  surgery or in a delayed fashion as it occurred in the first place, and he is  very worried about the possible recurrence of the widespread staph  infection.  For that reason he has elected  to undergo a __________  implant.  He understands that there is no guarantee that this will make him dry and  that he may require additional injections.  He gave full, informed consent  for the procedure.   DESCRIPTION OF PROCEDURE:  After successful induction of general anesthesia  the patient was placed in the dorsal lithotomy position and prepped with  Betadine and draped in the usual sterile fashion.  Cystoscopy was performed  and urethra was visualized in its entirety and was found to be normal.  The  patient's sphincter mechanism is actually fairly reasonably intact with  fairly good coaptation of the mucosa.  The bladder was carefully inspected.  It was free of any tumor or stones.  Both ureters were normal in  configuration and location.  The patient was then injected with 2 __________  implants, 1 at the 3 o'clock position and 1 at the 9 o'clock position on  each side; 1.4 mL was injected for over 1 minute,  and then was  held in  place for 2 minutes allowing the implant to set up. The patient had little  if any drainage of the implant material.  The scope was withdrawn.  The  patient tolerated the procedure well and was taken to the recovery room in  good condition.      RJE/MEDQ  D:  05/10/2004  T:  05/10/2004  Job:  [id number]   cc:   Domingo Pulse, M.D.  [address] [place]  [place] [address]  Fax: [phone]

## 2010-06-17 NOTE — Op Note (Signed)
NAME:  ALYJAH, HIRSCHMAN                          ACCOUNT NO.:  [id number]   MEDICAL RECORD NO.:  [id number]                   PATIENT TYPE:  AMB   LOCATION:  DAY                                  FACILITY:  [facility]   PHYSICIAN:  Domingo Pulse, M.D.               DATE OF BIRTH:  [birth year]   DATE OF PROCEDURE:  10/10/2002  DATE OF DISCHARGE:                                 OPERATIVE REPORT   PREOPERATIVE DIAGNOSES:  1. Bladder neck contracture.  2. Status post radical retropubic prostatectomy.   POSTOPERATIVE DIAGNOSES:  1. Bladder neck contracture.  2. Status post radical retropubic prostatectomy.  3. 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   OPERATION/PROCEDURE:  1. Cystoscopy.  2. Transurethral resection of bladder tumor.  3. Transurethral resection bladder neck contracture.   SURGEON:  Domingo Pulse, M.D.   ANESTHESIA:  General.   COMPLICATIONS:  None.   DRAINS:  18-French Silastic Foley catheter.   BRIEF HISTORY:  This 70 year old male underwent radical retropubic  prostatectomy.  The patient had extensive disease preoperatively and for  that reason was not offered nerve-sparing option.  He was given a wide  excision in order to insure that all cancer was removed.  It should be noted  that the patient had no erections prior to the procedure.   The patient has had undetectable PSA since the procedure and initially did  quite well, but then he began to develop some problems where he felt like  could not empty his bladder completely.  He underwent evaluation in the  office and urethroscopy demonstrated a tight bladder neck contracture.  A  guide wire was passed through that bladder neck and the urethra was dilated  up to 24-French.  First the patient could urinate after the dilation but  then he began to develop problems with retention and to have the Foley  catheter reinserted.  The patient is now to undergo laser incision or TUR of  the bladder neck  contracture to hopefully eliminate some of this problems  with dysfunctional voiding and retention.  The patient is aware of the fact  that this might expose him to higher risk for incontinence.  He gave full  and informed consent.   DESCRIPTION OF PROCEDURE:  After the successful induction of general  anesthesia, the patient was placed in the dorsal lithotomy position, prepped  with Betadine and draped in the usual sterile fashion.  Cystoscopy was  performed.  The cystoscope was inserted and it was easily passed down the  urethra and negotiated to the bladder.  This was somewhat surprising since  the patient had only a pin hole opening at the time of the urethra dilation.  When the scope was inserted and the bladder neck was visualized, it was  clear that it was more wide open than it had been prior to the dilation.  It  was still slightly  fixed in that area and somewhat rigid but the torn  standard cystoscope sheath did pass through this without much difficulty.  Inspection with the 12-degree lens demonstrated a mucosal abnormality.  At  first this was thought to simply be a little irritation from the Foley  catheter, but it was clear that this mass represented a TCC.  The patient  does have a strong smoking history but has stopped smoking after the radical  prostatectomy.   The remainder of the bladder was inspected.  Trabeculation could be seen but  no other areas of suspicion for TCC for identified.  The cystoscope was  removed.  The resectoscope was inserted.  It was tight getting that sheath  through the bladder but it was negotiated through.  The area in question was  carefully resected.  Hemostasis was taken with the electrocautery.  All  chips were irrigated from the bladder.  A little bit of the scarred area at  the bladder neck was carefully resected but this was very minimal and mostly  done to just slightly open that area and to obtain some tissue for  pathologic evaluation  in order to look for recurrence.  The resection was  certainly not pulled back anywhere near the area of the external sphincter.  The operative sites were inspected and there was no bleeding. All chips had  been irrigated from the bladder.  The resectoscope was removed. There was an  excellent flow of irrigant with Crede maneuver and certainly there appears  to be no evidence of any residual obstruction.  The 18-French catheter was  inserted and irrigates normally.   The patient will be given 23-hour observation status although he will be  given the option to go home later today if stable.  He will be sent home on  Lorcet, Septra and Pyridium.                                                 Domingo Pulse, M.D.    RJE/MEDQ  D:  10/10/2002  T:  10/10/2002  Job:  [id number]   cc:   Freeman Caldron. Pauline Aus, M.D.  [address] [place]  [place] [address]  Fax: [place] Melvyn Novas, M.D. [facility]

## 2010-06-17 NOTE — Discharge Summary (Signed)
NAMEBRAE, Lance Dillon                ACCOUNT NO.:  [id number]   MEDICAL RECORD NO.:  [id number]          PATIENT TYPE:  INP   LOCATION:  [place]                         FACILITY:  [facility]   PHYSICIAN:  Jonna L. Gwynneth Aliment, M.D.DATE OF BIRTH:  [birth year]   DATE OF ADMISSION:  12/15/2003  DATE OF DISCHARGE:  12/23/2003                                 DISCHARGE SUMMARY   PRIMARY CARE PHYSICIAN:  Dr. Minna Antis.   UROLOGIST:  Dr. Amalia Hailey   Critical care medicine consultation.   DIAGNOSES:  1.  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  2.  Chronic obstructive pulmonary disease.  3.  Hypertension.  4.  Zosyn-induced hepatitis.  5.  Protein calorie malnutrition.  6.  Hypokalemia.  7.  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  8.  Cancer of the prostate.  9.  Intertriginous Candidiasis.   HISTORY:  The patient is a 70 year old white male, who developed 102 fever,  swelling in the scrotum, vomiting, nausea, difficulty with passing urine.  The patient had had a procedure on October 25 by Dr. Amalia Hailey for malfunction  of a penile prosthesis with urinary incontinence and placement of an  artificial urinary sphincter.  On October 25, he had revision of the urinary  sphincter, replacement of a reservoir.   Physician examination on admission showed a very swollen left scrotum,  tender, erythematous, left inguinal bulge, pustule in the left groin, and  also pustules on the skin as well.   HOSPITAL COURSE:  The patient was admitted, put on Zosyn, and vancomycin was  added.  CT scan was done to rule out Fournier's, and that was negative.  The  patient continued to remain febrile with nausea, chills, and increased  swelling and distention, began to develop pustules on the left thigh and  upper right limb which were probably septic emboli.  The patient was  transferred to ICU, increased his IV fluids, and the patient was brought to  the operating room where the  prosthesis and sphincter were removed.  At that  point, the patient's white count was 14.5.  He was still maintaining his  blood pressure.  By the following day, the patient was much improved,  afebrile, much less pain.  His blood pressures were holding much better.  He  was less tachycardic.  He had a mild hypokalemia which was replaced, and his  white count had dropped from 15 to 11.7.  Albumin was 2.5.  Culture was  growing out gram-positive cocci in pairs.  Urine culture showed MSSA and  Enterococcus.  Critical care signed off on November 18.  The patient had a  normal cortisol, lactose.  BNP was normal.  The area was packed with wet-to-  dry sterile saline dressings.  On November 20, it was noted that the patient  began to increase is liver function tests to about twice normal.  Zosyn was  stopped, and the patient was left just on vancomycin, and the liver function  tests after 48 hours began to drop.  The patient had persistent low alkaline  phosphatase, and he was put on supplementation, vitamins, and encouraged to  eat extra protein.  He was started on packing pulse hydrotherapy.  On  November 23, he developed some diarrhea, nausea, and vomiting which may have  been due to some of the narcotics but if the diarrhea continues, may need to  be checked for C. difficile.   DISPOSITION:   DISCHARGE MEDICATIONS:  1.  Altace 10 daily.  2.  Multivitamin daily.  3.  Vitamin C 500 daily.  4.  Zinc sulfate 220 daily.  5.  Protonix 40 daily.  6.  Norvasc 5 daily.  7.  Topical Nystatin cream to intertriginous Candidiasis.  8.  Augmentin 875 b.i.d.  9.  Pulse therapy 3 times daily.   Physical therapy.      JLB/MEDQ  D:  12/23/2003  T:  12/24/2003  Job:  [id number]

## 2010-06-17 NOTE — Op Note (Signed)
NAME:  GRYFFIN, MAGGART                          ACCOUNT NO.:  [id number]   MEDICAL RECORD NO.:  [id number]                   PATIENT TYPE:  INP   LOCATION:  [id number]                                 FACILITY:  [facility]   PHYSICIAN:  Darrelyn Hillock, M.D.         DATE OF BIRTH:  January 22, 1941   DATE OF PROCEDURE:  04/14/2003  DATE OF DISCHARGE:                                 OPERATIVE REPORT   PREOPERATIVE DIAGNOSIS:  Incarcerated right inguinal hernia.   POSTOPERATIVE DIAGNOSIS:  Incarcerated, strangulated right inguinal hernia.   PROCEDURE:  Right groin exploration and right inguinal hernia repair.  Removal of penile prosthesis reservoir by Dr. Amalia Hailey.  Exploratory  laparotomy.  Small bowel resection.   SURGEON:  Darrelyn Hillock, M.D.   ASSISTANT:  Domingo Pulse, M.D.   DESCRIPTION OF PROCEDURE:  Patient was taken to the operating room and  placed in a supine position.  After adequate general endotracheal anesthesia  was induced, the perineum and abdomen were prepped and draped in the normal  sterile fashion.  A right inguinal incision was made.  I dissected down to  the external oblique fascia.  This had a bluish hue to it, and it was  carefully opened.  I immediately identified infarcted bowel with a small  microperforation without obvious contamination or succus.  The penile  reservoir was in the internal ring, which is where the bowel was herniated  through.  I am reducing the reservoir.  The bowel contents were able to be  reduced into the abdominal cavity.  The large internal ring was then closed  by approximating the inguinal ligament at the transversalis fascia with  multiple interrupted #1 Novofil sutures.  The external oblique fascia was  then closed with a running 3-0 Vicryl, and skin was closed loosely with  staples.   A lower vertical midline incision was made.  The peritoneum was entered.  A  significant amount of proximal dilated small bowel was identified at  the  area of strangulation.  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  This segment measured  about 8 inches long and was removed using GIA stapling device.  A side-to-  side enteroenterostomy was made in the standard fashion using a GIA and PA  stapling device.  Mesentery was closed with 2-0 silk sutures.  The abdomen  was copiously irrigated.  An NG tube was positioned and in good placement.  The fascia was closed with running #1 Novofil, and the skin was closed  loosely with staples.  The patient tolerated the procedure well and went to  the PACU in good condition.                                               Darrelyn Hillock, M.D.   KRH/MEDQ  D:  04/15/2003  T:  04/15/2003  Job:  [id number]   cc:   Domingo Pulse, M.D.  [address] [place]  [place] [address]  Fax: [phone]

## 2010-06-17 NOTE — Op Note (Signed)
NAME:  Lance Dillon, Lance Dillon                          ACCOUNT NO.:  [id number]   MEDICAL RECORD NO.:  [id number]                   PATIENT TYPE:  AMB   LOCATION:  DAY                                  [facility]ACILITY:  [facility]   PHYSICIAN:  Domingo Pulse, M.D.               DATE OF BIRTH:  1940-06-23   DATE OF PROCEDURE:  04/10/2003  DATE OF DISCHARGE:                                 OPERATIVE REPORT   PREOPERATIVE DIAGNOSES:  1. Stress urinary incontinence.  2. Organic erectile dysfunction.   POSTOPERATIVE DIAGNOSES:  1. Stress urinary incontinence.  2. Organic erectile dysfunction.   OPERATION/PROCEDURE:  1. Insertion of AMS 800 artificial GU sphincter.  2. Insertion of AMS 700 CX penile prosthesis.   SURGEON:  Domingo Pulse, M.D.   ANESTHESIA:  General.   COMPLICATIONS:  None.   DRAINS:  16-French Foley catheter per urethra.   BRIEF HISTORY:  This is a 70 year old male who initially presented to my  office for evaluation of erectile dysfunction.  The patient was found to  have an elevated PSA so he underwent a biopsy and then underwent a radical  prostatectomy.  The patient is considered cancer-free at this point.  However, redeveloped a bladder neck contracture that required opening on an  outpatient basis.  The patient now has free flow of urine but, of course,  has developed urinary incontinence.  The patient had planned all along to  have a penile prosthesis placed once he has recovered from this prostate  surgery.  He is now going to undergo a combined procedure with the insertion  of the penile prosthesis via an insertion of the artificial sphincter.  He  understands the risks and benefits of the procedure and gave full informed  consent.  The patient has received appropriate preoperative instructions in  terms of risks and benefits of the procedure.  He has received preoperative  antibiotics including Ancef and gentamicin.  He had a Hibiclens shower  preoperatively and will  receive a full 10 minutes scrub with Betadine scrub  and paint.   DESCRIPTION OF PROCEDURE:  After successful induction of general anesthesia,  the patient was placed in the supine position.  He received a full 10-minute  scrub and paint.  The drapes were applied and a Vi-Drape was placed over the  entire operative field.  A small hole was made in the Vi-Drape, bringing the  penis and scrotum out on to the field.  A Foley catheter was inserted.  This  passed easily with no evidence whatsoever of any obstruction.  The bladder  was drained.  The Wilson retractor was used to elevate the penis in the  usual fashion.  A transverse high scrotal incision was made and retractors  were used to expose the operative field.   Dissection proceeded down to the urethra and then out laterally to the  corporeal body on each side.  Corporotomies were made bilaterally and  holding sutures of 2-0 Vicryl were placed.  Additional sutures were  preplaced in preparation for the closure.  The corporeal body was dilated on  each side up to 14 with the [facility] dilators.  The penis was sized and it  appeared that a 22 cm device would be appropriate.  The 18 cm device with 4  cm rear-tip was then prepared in the usual fashion.  The device was inserted  on each side using the [facility] inserter tool and the device was implanted.  It appeared that it seated perfectly with no wrinkling at the corporotomy  site and excellent filling of the glans.  The preplaced sutures were then  tied down.  The area had been irrigated.  We visualized the cycle one more  time and it appeared to be in excellent position.   Attention was then directed to the urethra.  Posterior dissection exposed  the urethra and the corporeal bodies in the more proximal position.  The  urethra was elevated and a window was made behind the urethra.  A __________  was passed behind the urethra and the sizing tape was passed behind the  urethra.  A size of 3.5  cm was determined.  The sphincter was then passed  behind the urethra and the device was snapped in place.  A right-angle clamp  could be passed between the urethra and the cuff, indicating that it was not  excessively tight.  The Wilson retractor was then taken down and attention  was directed to the right inguinal area.  With blunt dissection, an opening  was made through the transversalis fascia on the right-hand side and the  reservoir for the penile prosthesis was placed.  A total of 65 cm was placed  within that reservoir and there was no back pressure.  This was connected to  the pump tubing and pump was then placed down in the right hemiscrotum.  In  the left inguinal area, a small opening was made in the transversalis fascia  and the 7180 reservoir was inserted into that opening.  The reservoir was  then connected to the pump with a straight connector and the pump was  connected to the cuff with an angle connection.  The entire area was  irrigated.  The penile prosthesis was pumped up one more time and was  checked and appeared to function normally.  The GU sphincter was then  deactivated and remained deactivated, indicating that all connections have  been made appropriately.  The area was irrigated one more time.  The  incision was then closed in multiple layers, separating the components of  the artificial sphincter from the penile prosthesis as much as possible.  The pump from the GU sphincter was deep within the left hemiscrotum and the  pump for the penile prosthesis was deep within the right hemiscrotum.  The  skin was then closed with a running suture of 3-0 Prolene.  The patient had  a Tegaderm applied.  Coban was placed around the penis.  The Foley catheter  was left in place.  The patient had a pressure dressing applied, bringing  the penis up on the lower abdomen.  The patient also had ice applied and a  scrotal support.   The patient tolerated the procedure well and  was taken to the recovery room  in good condition.  Domingo Pulse, M.D.    RJE/MEDQ  D:  04/10/2003  T:  04/10/2003  Job:  [id number]   cc:   Freeman Caldron. Pauline Aus, M.D.  [address] [place]  [place] [address]  Fax: [phone]

## 2010-06-17 NOTE — Op Note (Signed)
Lance Dillon, Lance Dillon                ACCOUNT NO.:  [id number]   MEDICAL RECORD NO.:  [id number]          PATIENT TYPE:  AMB   LOCATION:  [facility]                         FACILITY:  [facility]   PHYSICIAN:  Domingo Pulse, M.D.  DATE OF BIRTH:  August 10, 1940   DATE OF PROCEDURE:  06/15/2004  DATE OF DISCHARGE:                                 OPERATIVE REPORT   SERVICE:  Urology.   PREOPERATIVE DIAGNOSES:  Stress urinary incontinence.   POSTOPERATIVE DIAGNOSES:  Stress urinary incontinence.   PROCEDURE:  Cystoscopy, Tegress injection and Coaptite injection.   SURGEON:  Domingo Pulse, M.D.   ANESTHESIA:  General.   COMPLICATIONS:  None.   DRAINS:  None.   BRIEF HISTORY:  This 70 year old male has stress urinary incontinence. The  patient had a radical prostatectomy with no evidence of any residual cancer.  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 After the bladder neck contracture was opened, he has had  stress incontinence. He had a GU sphincter placed which worked very nicely  for him but at the same time had a penile prosthesis inserted. The patient  developed some problems with postoperative nausea and when he was coughing  and straining developed a hernia. This was not picked up over the next few  days and when the patient finally presented to the office it was clear he  had a strangulated hernia. This necessitated removal of some of the  hardware. The patient subsequently had that put back in and this worked  until he developed a problem with a postoperative infection. The infection  source appeared to be from the skin as he was covered by a miliary type of  infection with multiple small areas on both the upper and lower extremities.  This appeared to be consistent with possible MRSA but certainly was some  form a staph infection. The patient has had obvious incontinence since the  sphincter was removed. He has healed up nicely from all  of his incisions but  he is very very reluctant to consider replacement of the GU sphincter  because of this problem with infections. The patient has had one Tegress  injection and he does appear to have had at least some improvement but  clearly has a long way to go until he will be dry. He gave full and informed  consent.   DESCRIPTION OF PROCEDURE:  After successful induction of general anesthesia,  the patient was placed in the dorsal lithotomy position, prepped with  Betadine and draped in the usual sterile fashion. Cystoscopy was performed  and the urethra was visualized in its entirety and was found to be normal.  The patient did have what appeared to be a pretty good Tegress implant on  the left hand side, the right side hand of the implant just did not appear  to take as well. We attempted to inject the Coaptite product from bard but  this really did not inject particularly well, it was very difficult to go  and one of the syringes just would not empty since  this material had  actually coalesced within the syringe. It did not appear that we had much  success with that so the patient was subsequently injected with the Tegress.  He had a series of injections around the bladder neck and within that  prostate area with two fully syringes. In each case, a half syringe was  injected at each site and this was done over one minute and was held in  place for a minute in order to allow this to set up. Obviously this will  continue to expand and we hope that the patient may get some control from  this. It did appear, however, at the end despite the fact that there was  some definite improvement, I do not believe that he will really have  adequate continence and it is likely he is going to have to consider having  either another sphincter or a male sling inserted. We will certainly not do  this anytime soon. We are going to wait and see how he does with this. We  will give the patient  doxicycline for his skin lesions and have him return  to see [place] in followup in 3-4 weeks.     _______________    [id number]  D:  06/15/2004  T:  06/15/2004  Job:  [id number]

## 2010-06-17 NOTE — Consult Note (Signed)
NAME:  Lance Dillon, Lance Dillon                          ACCOUNT NO.:  [id number]   MEDICAL RECORD NO.:  [id number]                   PATIENT TYPE:  INP   LOCATION:  [id number]                                 FACILITY:  [facility]   PHYSICIAN:  Darrelyn Hillock, M.D.         DATE OF BIRTH:  [birth year]   DATE OF CONSULTATION:  04/14/2003  DATE OF DISCHARGE:                                   CONSULTATION   REASON FOR CONSULTATION:  Incarcerated right inguinal hernia.   REFERRING PHYSICIAN:  Dr. Alona Bene.   HISTORY OF PRESENT ILLNESS:  The patient is a 70 year old white male with a  history of impotence, urinary incontinence, and prostate cancer.  The  patient underwent radical prostatectomy in the summer of last year.  He  presented last week for placement of a prostatic sphincter and penile  prosthesis.  After being discharged home, the patient developed some nausea,  vomiting, and coughing and was noted to have some swelling in the right  groin.  He was admitted yesterday, NG tube was placed but the patient's  symptoms continued to worsen.  CT scan revealed the presence of incarcerated  right inguinal hernia.   PAST MEDICAL HISTORY:  Significant for hypertension.   PAST SURGICAL HISTORY:  As above.   PHYSICAL EXAMINATION:  GENERAL:  He is an age-appropriate white male and  looking acutely ill in the preop holding suite.  He is pale.  NG tube is  placed.  GENERAL:  Heart rate is 95, blood pressure is 118/70, respiratory rate 16.  HEENT:  Benign.  Normocephalic, atraumatic.  Pupils are reactive and equal.  LUNGS:  Clear to auscultation and percussion x2.  HEART:  Regular rate and rhythm without murmurs, rubs, or gallops.  ABDOMEN:  Slightly firm and distended.  He has a warm, tender right inguinal  hernia.  GENITOURINARY:  A well-healed scrotal incision.  The penis is in an erectile  state.  EXTREMITIES:  No clubbing, cyanosis, or edema.   IMPRESSION:  Incarcerated probable strangulated  right inguinal hernia.   PLAN:  Right groin exploration with hernia repair and possible laparotomy  with bowel resection.  I discussed this at length with the patient and his  wife and they understand the probability that this will require exploratory  laparotomy and bowel resection.  They wish to proceed.                                               Darrelyn Hillock, M.D.    KRH/MEDQ  D:  04/15/2003  T:  04/16/2003  Job:  [id number]

## 2010-06-17 NOTE — H&P (Signed)
NAME:  Lance Dillon, Lance Dillon                          ACCOUNT NO.:  [id number]   MEDICAL RECORD NO.:  [id number]                   PATIENT TYPE:  INP   LOCATION:  [id number]                                 FACILITY:  [facility]   PHYSICIAN:  Domingo Pulse, M.D.               DATE OF BIRTH:  1940/09/15   DATE OF ADMISSION:  04/13/2003  DATE OF DISCHARGE:                                HISTORY & PHYSICAL   ADMISSION DIAGNOSES:  1. Dehydration.  2. Intractable nausea.  3. Small bowel obstruction versus postoperative ileus.   HISTORY:  This 70 year old male is status post radical prostatectomy.  The  patient had originally presented to the office with erectile dysfunction. As  part of the dilation, was found to have an elevated PSA which lead to the  detection of his cancer.  The patient is now felt to be cancer-free.  He did  develop a problem with a bladder neck contracture but had that successfully  treated.  The patient has always wanted a penile prosthesis inserted and  because he had developed some problems with incontinence after the bladder  neck contracture was treated, he underwent combined surgery for placement of  a penile prosthesis and an artificial GU sphincter.   His procedure was performed on March 11.  On postoperative day #1, he ate a  normal breakfast and was discharged by Dr. Jeffie Pollock.  The patient subsequently  developed nausea over the next few hours and was given Phenergan to try and  stop his nausea.  The patient informed me that the nausea became intractable  on Saturday and Sunday.  It was thought that this might be due to pain  medications so we discontinued all pain medication, but he had significant  retching through the next 24 hours.  The patient presented to the office  early in the morning on April 13, 2003, where he clearly had an ileus or  possible small bowel obstruction.  During the initial evaluation, we noted  the patient did have some swelling of the right groin  of undetermined  etiology.  Given the severity of the patient's nausea, it is possible that  this could be a new hernia, or possible extrusion of the patient's reservoir  due to excessive intra-abdominal pressure during his vomiting or, of course,  it could be a small hematoma.  The patient is being admitted for IV fluids,  dehydration, and additional antibiotic therapy because he is so recently  passed prosthetic surgery.   PAST MEDICAL HISTORY:  1. Remarkable, of course, for the recent radical prostatectomy.  2. He is known to have hypertension.  3. Mild COPD.   MEDICATIONS:  1. Zocor 20 mg daily.  2. Altace 10 mg daily.  3. Norvasc 10 mg daily.  4. Fish oil.  5. Multivitamins for the eyes.  6. Centrum Silver.  7. Calcium.  8. He had some recent __________ interactions for  which he was started     amoxicillin.   When the patient left the hospital after his surgery for the artificial  sphincter and penile prosthesis, he was sent home with Keflex and pain  medication, both of which he discontinued when he began to have  such  problems with his nausea.   PAST SURGICAL HISTORY:  1. Teeth extraction on April 06, 2003.  2. Bladder neck resection on February 13, 2003.  3. Radical prostatectomy was done last year in June.  4. He had a left inguinal hernia repair done in 2003 as well as additional     repair done at the time of the radial prostatectomy.  5. He had a left a ganglion cyst removed many years ago.   SOCIAL HISTORY:  The patient's social history is unremarkable.  He does not  use alcohol.  He stopped smoking in June 2004 when he underwent surgery for  his prostate cancer.   Family history and review of systems are noncontributory.   PHYSICAL EXAMINATION:  VITAL SIGNS:  Temperature 98.1, pulse 76,  respirations 16, blood pressure 150/90.  GENERAL:  The patient is a well-developed, but very dehydration male.  HEENT:  He had clearcut changes in mucosal surfaces.  His  eyes look  shrunken.  His mouth is very dry and parched.  Otherwise unremarkable.  Cranial nerves II-XII appear grossly intact.  LUNGS:  His respiratory effort showed occasional rale but no rales and  nothing suggestive of congestive failure.  The patient is known to have  required an incentive spirometer following his recent surgery and was sent  with instructions how to use that device.  HEART:  Regular rate and rhythm.  No murmurs, thrills, gallops, rubs,  heaves.  ABDOMEN:  Distended with diminished bowel sounds.  GENITALIA:  There is some postoperative swelling but actually no significant  hematoma, no drainage from the incision at the penoscrotal junction.  The  patient, as noted above, did have some unexplained swelling in the right  groin.  The penis looked fine with minimal bruising.  EXTREMITIES:  No cyanosis, clubbing or edema.   IMPRESSION:  1. Dehydration.  2. Small bowel obstruction.   PLAN:  Admit for IV fluid, NG, as well as supportive therapy.                                               Domingo Pulse, M.D.    RJE/MEDQ  D:  04/14/2003  T:  04/14/2003  Job:  [id number]

## 2010-06-17 NOTE — Op Note (Signed)
Lance Dillon, Lance Dillon                ACCOUNT NO.:  [id number]   MEDICAL RECORD NO.:  [id number]          PATIENT TYPE:  AMB   LOCATION:  ENDO                         FACILITY:  [facility]   PHYSICIAN:  Waverly Ferrari, M.D.    DATE OF BIRTH:  [birth year]   DATE OF PROCEDURE:  10/25/2005  DATE OF DISCHARGE:                                 OPERATIVE REPORT   PROCEDURE:  Colonoscopy with polypectomy.   INDICATIONS:  Colon polyps.   ANESTHESIA:  Fentanyl 70 mcg, Versed 5 mg.   PROCEDURE:  With the patient mildly sedated in the left lateral decubitus  position, a rectal examination was performed which was unremarkable.  The  prostate was not palpable. Subsequently, the Olympus videoscopic colonoscope  was inserted in the rectum and passed under direct vision with pressure  applied to the abdomen and we reached the cecum identified by the ileocecal  valve and base of the cecum, both of which were photographed. From this  point, the colonoscope was slowly withdrawn taking circumferential views of  the colonic mucosa stopping at approximately 20 cm from the anal verge at  which point a polyp that had been previously seen and biopsied was  photographed and removed using snare cautery technique at a setting of  20/200 blended current.  The tissue was suctioned into the scope and  retrieved for pathology.  The endoscope was then withdrawn to the rectum  which appeared normal on direct and showed hemorrhoids on retroflexed view.  The endoscope was straightened and withdrawn.  The patient's vital signs and  pulse oximeter remained stable.  The patient tolerated the procedure well  without apparent complications.   FINDINGS:  Small polyp 20 cm from the anal verge removed by snare cautery  technique, otherwise, unremarkable colonoscopic examination to the cecum.   PLAN:  Await biopsy report of the entire polyp.  The patient will call me  for results and follow-up with me as needed as an  outpatient.           ______________________________  Waverly Ferrari, M.D.     GMO/MEDQ  D:  10/25/2005  T:  10/26/2005  Job:  [id number]   cc:   Alona Bene, M.D.

## 2010-06-17 NOTE — Op Note (Signed)
NAME:  Lance Dillon, Lance Dillon                          ACCOUNT NO.:  [id number]   MEDICAL RECORD NO.:  [id number]                   PATIENT TYPE:  AMB   LOCATION:  DAY                                  [facility]ACILITY:  [facility]   PHYSICIAN:  Domingo Pulse, M.D.               DATE OF BIRTH:  1940/11/02   DATE OF PROCEDURE:  02/13/2003  DATE OF DISCHARGE:                                 OPERATIVE REPORT   PREOPERATIVE DIAGNOSIS:  Bladder neck contracture, status post radical  retropubic prostatectomy.   POSTOPERATIVE DIAGNOSIS:  Bladder neck contracture, status post radical  retropubic prostatectomy.   OPERATION/PROCEDURE:  1. Cystoscopy.  2. Transurethral resection bladder neck contracture.   SURGEON:  Domingo Pulse, M.D.   ANESTHESIA:  General.   COMPLICATIONS:  None.   DRAINS:  24-French Silastic catheter.   HISTORY:  This 70 year old male is status post radical retropubic  prostatectomy.  The patient has had problems with a postoperative bladder  neck contracture.  He has had this dilated but it tends to recur.  He is now  to undergo TUR.  He understands the risks and benefits of the procedure and  gave full and informed consent.   DESCRIPTION OF PROCEDURE:  After successful induction of general anesthesia,  the patient was placed in the dorsal lithotomy position, prepped with  Betadine and draped in the usual sterile fashion.  Cystoscopy was performed.  The urethra visualized in its entirety and was found to be normal.  The  patient was found to have a narrowed bladder neck that could not be bypassed  by the telescope.  A wire was passed through this and the patient was  dilated up to 30-French.  The resectoscope was then inserted and a Otis Brace cut with three deep cuts were made.  This was down into the fat and  very nicely opened up the bladder neck.  Sphincter mechanism actually  appeared to be reasonably intact.  The bladder itself was then inspected and  was free of any  tumor or stone.  Both ureteral orifices were normal in  configuration and location.  The patient had moderate trabeculation.  The  resectoscope was removed.  There was excellent flow.  The patient had a 81-  [place] catheter placed.  Silastic catheter was selected to cut down on  inflammation.  This will be left in for 23 hours.  The patient will  __________ 23 hours.  The catheter will be removed tomorrow.  Once the  bladder neck has been stabilized, arrangements will be made for the patient  to undergo penile prosthesis since he had __________ erectile dysfunction  either predating his surgery and he will undergo simultaneous placement of  an artificial GU sphincter.  Domingo Pulse, M.D.    RJE/MEDQ  D:  02/13/2003  T:  02/13/2003  Job:  [id number]

## 2010-06-17 NOTE — Op Note (Signed)
NAME:  Lance Dillon, Lance Dillon                          ACCOUNT NO.:  [id number]   MEDICAL RECORD NO.:  [id number]                   PATIENT TYPE:  INP   LOCATION:  [id number]                                 FACILITY:  [facility]   PHYSICIAN:  Domingo Pulse, M.D.               DATE OF BIRTH:  [birth year]   DATE OF PROCEDURE:  07/15/2002  DATE OF DISCHARGE:                                 OPERATIVE REPORT   PREOPERATIVE DIAGNOSES:  1. Carcinoma of the prostate.  2. Left inguinal hernia.   POSTOPERATIVE DIAGNOSES:  1. Carcinoma of the prostate.  2. Left inguinal hernia.   OPERATION/PROCEDURE:  1. Bilateral pelvic lymph node dissection.  2. Radical retropubic prostatectomy.  3. Left inguinal hernia repair.   SURGEON:  Domingo Pulse, M.D.   ASSISTANT:  Thana Farr. Karsten Ro, M.D.   ANESTHESIA:  General.   COMPLICATIONS:  None.   DRAINS:  A 20-French Silastic Foley catheter and a #10 flat Jackson-Pratt  drain.   HISTORY:  This 70 year old male presented to the office with two urologic  problems.  The first was microscopic hematuria.  The patient underwent  cystoscopy and a CT scan.  This evaluation was unremarkable with the  exception of a small inguinal hernia.  In addition, the patient had an  elevated PSA and underwent a prostatic ultrasound-guided biopsy.  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  He was found to have 70% of the prostate involved on the right  and 40% on the left.  The patient was staged with both a CT scan and bone  scan and they were negative.  He was given options for therapy including  watchful waiting, surgery, radiation therapy which would likely consist of a  combination of external beam and radiation seeds as well as less plausible  options such as cryosurgery, early hormone therapy or chemotherapy.  The  patient elected to undergo surgery because he does have a history of COPD.  He had preoperatively clearance  and was felt to be an acceptable risk for  radical prostatectomy.  He would like to have his hernia repaired at the  same time.  The patient  gave full and informed consent for the procedure.  He is aware of the fact that he is at risk for positive margins because of  the bilateral disease and for that reason a nerve-sparing modification will  not be utilized.  The patient gave full and informed consent.   DESCRIPTION OF PROCEDURE:  After successful induction of general anesthesia,  the patient was placed in the supine position, prepped with Betadine and  draped in the usual fashion.  A slight bump was placed under the hips in  order to elevate the pelvic contents.  A lower midline incision was made and  was carried out through Scarpa's fascia until the rectus  abdominis was  identified.  The rectus was split in the midline exposing the space of  Retzius.  On the left-hand side, the hernia sac was tracked and this did  cause an opening in the perineum later on in the case.  The bowel contents  were retracted superiorly and this opening in the perineum was closed with a  running suture of 3-0 Vicryl.  The space of Retzius developed bilaterally  and the standard lymph node dissection was performed.  Dissection took the  packet off of the external iliac vein exposing the obturator nerve on each  side.  Hemostasis was obtained with surgical clips.  The endopelvic fascia  was opened on each side exposing the puboprostatic ligaments.  These were  taken down bilaterally.  A Hohenfellner clamp was placed behind the dorsal  vein complex and two separate ties of #1 Vicryl were placed on the dorsal  vein complex.  A 0 chromic suture was placed on the dorsal veins to prevent  backbleeding.  The Hohenfellner was placed underneath the dorsal vein one  additional time.  The dorsal vein was transected exposing the apex of the  prostate.  The Hohenfellner was passed behind the urethra which was then   transected, exposing the Foley catheter.  The catheter was elevated and  transected exposing the posterior aspect of the urethra which was then also  divided.  The rectourethralis was divided, allow Denonvilliers fascia to be  entered.  The prostate was elevated exposing the lateral pedicles.  The  pedicles were taken down on each side with Weck clips.  The seminal vessels  were dissected free of surrounding tissue.  The vas and the seminal vessel  tips were then clipped with Weck clips.  The entire process mobilized up to  the bladder neck.  The bladder neck was transected.  Great care was taken to  avoid any prostatic tissue with the bladder. The bladder was then tailored a  little bit with one suture of 2-0 Vicryl.  At that point tip of the fifth  finger could be introduced into the bladder.  The mucosa was then everted  with 4-0 Vicryl sutures.   Attention was then directed to the hernia repair.  As mentioned earlier, the  perineal opening was then closed.  The defect at the ring was then closed on  the inside with three sutures of Ethibond.  The patient was carefully  inspected.  Adequate hemostasis was obtained with additional electrocautery  and clips.  The [place] sound was placed within the urethra.  Five double-  arm sutures of Monocryl were then placed at 12 o'clock, 2 o'clock, 5  o'clock, 7 o'clock, and 10 o'clock positions.  A Foley catheter was placed  up into the bladder.  The double-arm sutures were then placed through the  bladder neck at the appropriate locations.  The sutures were then stitched  down completing the anastomosis.  The catheter was irrigated and there was  no leakage.  The outflow was clear.  The patient had a #10 Blake drain  placed within the pelvis.  This was sutured in place with an Ethibond  suture.  The incision was then closed with a running suture of #1 PDS.  The incision was irrigated and then closed with surgical clips.  The patient  tolerated  the procedure well and was taken to the recovery room in good  condition.  Domingo Pulse, M.D.    RJE/MEDQ  D:  07/15/2002  T:  07/15/2002  Job:  [id number]   cc:   Freeman Caldron. Pauline Aus, M.D.  [address] [place]  [place] [address]  Fax: [place] Melvyn Novas, M.D. [facility]

## 2010-06-17 NOTE — Op Note (Signed)
   NAME:  Lance Dillon, Lance Dillon                          ACCOUNT NO.:  [id number]   MEDICAL RECORD NO.:  [id number]                   PATIENT TYPE:  AMB   LOCATION:  ENDO                                 FACILITY:  [facility]   PHYSICIAN:  Waverly Ferrari, M.D.                 DATE OF BIRTH:  1941-01-02   DATE OF PROCEDURE:  05/20/2002  DATE OF DISCHARGE:                                 OPERATIVE REPORT   PROCEDURE:  Colonoscopy.   INDICATIONS:  Colon polyp.   ANESTHESIA:  Demerol 50, Versed 5 mg.   DESCRIPTION OF PROCEDURE:  With the patient mildly sedated in the left  lateral decubitus position, the Olympus videoscopic colonoscope was inserted  in the rectum and passed under direct vision to the cecum, identified by the  ileocecal valve and appendiceal orifice, both of which were photographed.  From this point, the colonoscope was slowly withdrawn taking circumferential  views of the entire colonic mucosa, pulling back all the way to the rectum,  stopping only about 2-3 folds removed from the cecum at which point a small  polyp was seen, photographed. and removed using hot biopsy forceps  technique, setting of 20:20 blended current until we reached the rectum  which appeared normal on direct and showed hemorrhoid on retroflexed view.  The endoscope was straightened and withdrawn.  The patient's vital signs and  pulse oximeter remained stable.  The patient tolerated the procedure well  with no apparent complications.   FINDINGS:  Unremarkable colonoscopic examination other than internal  hemorrhoids an a small polyp of the right colon removed.   PLAN:  Await biopsy report.  The patient will call me for results and follow  up with me as an outpatient.                                               Waverly Ferrari, M.D.    GMO/MEDQ  D:  05/20/2002  T:  05/20/2002  Job:  [id number]

## 2010-06-17 NOTE — Discharge Summary (Signed)
NAME:  Lance Dillon, Lance Dillon                          ACCOUNT NO.:  [id number]   MEDICAL RECORD NO.:  [id number]                   PATIENT TYPE:  INP   LOCATION:  [id number]                                 FACILITY:  [facility]   PHYSICIAN:  Domingo Pulse, M.D.               DATE OF BIRTH:  09/17/1940   DATE OF ADMISSION:  04/13/2003  DATE OF DISCHARGE:  04/25/2003                                 DISCHARGE SUMMARY   DISCHARGE DIAGNOSES:  1. Right inguinal hernia.  2. Bowel obstruction.  3. Hypovolemia.  4. Chronic obstructive pulmonary disease.  5. Hypertension.  6. Persistent vomiting.  7. Recent history of prostatic malignancy.   HISTORY:  This 70 year old male is status post radical prostatectomy for  treatment of carcinoma of the prostate. The patient was noted to have  problems with erectile dysfunction both before and after that procedure. 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 The patient had always wanted to have a penile prosthesis inserted  and because of incontinence as to how the bladder neck contracture was  treated, he underwent combined surgery for placement of penile prosthesis  and an artificial GU sphincter on April 10, 2003.  On postoperative day #1,  the patient ate a normal breakfast and was discharged by Dr. Jeffie Pollock, who was  on call. Over the next few hours the patient had some problems with nausea  and was given Phenergan on the assumption that this was due to his pain  medication. The patient subsequently informed me that the nausea became  severe on Saturday and Sunday, and he discontinued all pain medications and  continued on significant retching through the 24 hours and presented to my  office on the morning of April 13, 2003. The patient's initial evaluation  showed a swollen abdomen that we thought was due to an ileus and noted  swelling of the right groin of an undetermined etiology. It was thought that  this could   possibly be a new hernia, but more likely was felt to be  hematoma or possibly the reservoir itself protruding up into the groin. The  patient certainly had swelling throughout the scrotal area secondary to the  recent surgery. The decision was made to admit him to the hospital, place an  NG tube, and evaluate him.   The patient is known to have hypertension and COPD. He has had radical  prostatectomy and subsequent reconstructive surgery which are the principal  surgeries he has had. At the time of admission, the patient was taking  Zocor, Altace, Norvasc, multivitamins, and calcium. The patient had been  sent home on Keflex and pain medications after sphincter surgery, but he  discontinued this when he developed nausea. The patient's previous surgeries  include teeth extraction in 2005, bladder neck surgery and radical  prostatectomy, previous left inguinal hernia repair, and left ganglion cyst.  The  patient does not use alcohol. He has now stopped since 2004.   On physical examination today the patient is very dehydrated. He had  diminished bowel sounds, abdominal distention, and he had postoperative  swelling, right and left, but a little bit more on the right. The initial  impression was dehydration with probable postoperative ileus or possible  small bowel obstruction. The patient was admitted for IV fluids and NG tube  placement.   The patient felt better with NG tube placement and was noted on his initial  vital signs to be afebrile and to have a normal white count on his  laboratory studies. At first the patient felt that the swelling that was  present in the groin had been there since surgery, but eventually he told me  that he felt that the swelling came up acutely during one of his episodes of  nausea. This raised the possibility that the reservoir had protruded out  through the inguinal canal and that was what was palpable. The decision was  made to perform an imaging study  to determine that this was the reservoir,  hematoma, or possibly a bowel hernia. The patient was sent down for an  ultrasound, but the radiologist felt that a CT was better. Unfortunately  they did not get to the CT until late in the day. When I had checked to find  out why this was not happening I had been told that the patient had emesis  and could not get the bowel contrast down, and I asked that this be done  without waiting for bowel contrast. I spoke with Dr. Vernard Gambles and asked that  he contact me as soon as results are available.   I was contacted late in the evening on April 14, 2003, and immediately  called Dr. Truitt Leep who arranged to see the patient because it was clear  that the patient developed a hernia with possible incarceration. She and I  took the patient to the ER on April 14, 2003, where he underwent groin  exploration and reduction of the incarcerated hernia. It was thought that  the patient's bowel was ready to perforate, and to that reason a session was  resected and I made the decision to remove the reservoir from his sphincter.  I felt that there was no gross contamination and felt perhaps no  contamination and thought that the remainder of the prostatic implant,  including tubing as well as the entire GU sphincter could be left in place.   The patient was started on T&A in order to try and improve his wound  healing. He was maintained on broad-spectrum antibiotic coverage and really  did very nicely. He almost immediately defervesced. He felt significantly  better and never developed a wound infection. He certainly had a slow and  prolonged course so far as resumption of bowel function is concerned. The  patient's NG tube was actually removed four to five days postoperatively. A  T&A was maintained throughout that time.  The patient never had more than a  low grade temperature of 100 or so.  The patient's scrotum was carefully followed and there were never any  signs of any infection. It looked just  like fine. Neosporin was placed to the old suture line and once again there  was never any evidence of infection in that area. The T&A was turned down.  The patient was given a clear liquid diet on April 21, 2003, and was rapidly  advanced to a  regular diet, and really felt significantly better. The labs  remained stable, the patient remained afebrile, and it was felt that the  patient could be discharged home. The plan was to have the patient fully  recover from this procedure and then activate his artificial GU sphincter.  Once the patient is using that he can have his reservoir replaced so that he  can begin to use the penile prosthesis as well. Once the patient had a good  bowel movement and had good gas passage he is felt to be ready for  discharge. He was sent home on April 25, 2003.  He will return to see me in  one week's time as well as follow up with Dr. Truitt Leep.                                               Domingo Pulse, M.D.    RJE/MEDQ  D:  05/08/2003  T:  05/08/2003  Job:  [id number]

## 2010-06-17 NOTE — H&P (Signed)
NAME:  Lance Dillon, HENDREN                          ACCOUNT NO.:  [id number]   MEDICAL RECORD NO.:  [id number]                   PATIENT TYPE:  INP   LOCATION:  [id number]                                 FACILITY:  [facility]   PHYSICIAN:  Domingo Pulse, M.D.               DATE OF BIRTH:  Dec 09, 1940   DATE OF ADMISSION:  07/15/2002  DATE OF DISCHARGE:                                HISTORY & PHYSICAL   ADMITTING DIAGNOSES:  1. Carcinoma of the prostate.  2. Left inguinal hernia.  3. Chronic obstructive pulmonary disease.  4. Hypertension.   HISTORY:  This 70 year old male developed an elevated PSA and was referred  for urologic evaluation.  In addition, the patient had microscopic  hematuria.  His PSA was rechecked.  He was found to have an elevated PSA of  8.6 with a free PSA of 15.3.  The biopsies demonstrated Gleason score 7 in  70% of the prostate on the right and a Gleason score of 7 in 40% of the  biopsy on the left.  The patient was staged further with a CT scan and bone  scan.  The only positive finding was a left inguinal hernia.  The patient  was given options for therapy and he elected to undergo radical  prostatectomy.  He is aware of the fact that he may require additional  therapy with radiation or chemotherapy as well as hormone therapy, again,  depending upon the results of his procedure.  He was cleared for surgery  both from a cardiac and a pulmonary standpoint.  Is to be admitted following  the radical retropubic prostatectomy.  In addition, he will undergo  intraoperative repair of his hernia.   PAST MEDICAL HISTORY:  1. Hypertension.  2. COPD.  3. Kidney stones.  4. 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   CURRENT MEDICATIONS:  1. Norvasc.  2. Altace.  3. Zocor.  4. Multivitamin.   PAST SURGICAL HISTORY:  1. Hernia repair.  2. Distant surgeries for kidney stones.   SOCIAL HISTORY:  Pertinent for cigarette use of one to one and a half pack  per day which he has  done for many years.  The patient has been told that  his pulmonary status is equivalent of a 70 year old man.  The patient does  not use alcohol.   FAMILY HISTORY:  Pertinent for hypertension, heart disease, and kidney  stones.  Multiple family members have died either of heart disease,  cerebrovascular accidents, or emphysema.   REVIEW OF SYSTEMS:  Constipation, erectile problems, slow urinary stream,  difficulty emptying, but otherwise is negative.  He denies shortness of  breath or significant problems with cough, although he is known to have mild  to moderate COPD.   PHYSICAL EXAMINATION:  GENERAL:  He is a well-developed, well-nourished male  in no acute distress.  HEENT:  Normocephalic, atraumatic.  Cranial nerves II-XII are grossly  intact.  NECK:  Supple without adenopathy or thyromegaly.  LUNGS:  Clear.  The patient's lungs do have fairly good air movement.  There  is occasional rhonchi.  HEART:  Regular rate and rhythm with no murmurs, thrills, gallops, rubs, or  heaves.  ABDOMEN:  Soft and nontender with no palpable masses, rebound, or guarding.  EXTREMITIES:  No clubbing, cyanosis, edema.  NEUROLOGIC:  Grossly intact.  GENITOURINARY:  Normal.  RECTAL:  As noted above and shows an enlarged prostate.  It is smooth,  however, without any obvious extension outside of the prostate itself.   IMPRESSION:  1. Carcinoma of the prostate.  2. Left inguinal hernia.  3. Chronic obstructive pulmonary disease.  4. Hypertension.   PLAN:  Admit following radical retropubic prostatectomy.                                               Domingo Pulse, M.D.    RJE/MEDQ  D:  07/15/2002  T:  07/15/2002  Job:  [id number]   cc:   Freeman Caldron. Pauline Aus, M.D.  [address] [place]  [place] [address]  Fax: [place]fer Melvyn Novas, M.D. [facility]

## 2010-06-17 NOTE — Discharge Summary (Signed)
NAME:  [name], Lance Dillon                          ACCOUNT NO.:  [id number]   MEDICAL RECORD NO.:  [id number]                   PATIENT TYPE:  INP   LOCATION:  [id number]                                 FACILITY:  [facility]   PHYSICIAN:  Domingo Pulse, M.D.               DATE OF BIRTH:  [birth year]   DATE OF ADMISSION:  07/15/2002  DATE OF DISCHARGE:  07/18/2002                                 DISCHARGE SUMMARY   ADMISSION DIAGNOSES:  1. Carcinoma of the prostate.  2. Chronic obstructive pulmonary disease.  3. Left inguinal hernia.  4. Hypertension.  5. Macular degeneration.  6. Tobacco use.   POSTOPERATIVE DIAGNOSES:  1. Carcinoma of the prostate.  2. Chronic obstructive pulmonary disease.  3. Left inguinal hernia.  4. Hypertension.  5. Macular degeneration.  6. Tobacco use.   PRINCIPAL PROCEDURE:  Radical prostatectomy with associated inguinal hernia  repair and bilateral lymph node dissection.   HISTORY:  This 70 year old male developed an elevated PSA and on biopsy was  found to have Gleason score of 7 and 70% approximately on the right and a  Gleason score of 7 and 40% of the biopsy on the left. The patient underwent  staging which was negative but was found to have a left inguinal hernia. The  patient was appraised as to all the possible options of therapy and elected  to undergo radical prostatectomy.   PAST MEDICAL HISTORY:  Hypertension, COPD, kidney stones and macular  degeneration in the right eye.   MEDICATIONS ON ADMISSION:  Norvasc, Altace, Zocor and multivitamins.   HOSPITAL COURSE:  The patient was taken to the operating room where he  underwent bilateral pelvic lymph node dissection, radical retropubic  prostatectomy and an intraoperative left inguinal hernia repair. The  patient's postoperative course was unremarkable. He was unable to ambulate  on postoperative day #1. He was rapidly advanced to a regular diet. His  postoperative laboratory status was acceptable  with normal electrolytes and  hematocrit of 27.4. The JP was pulled back a little bit to try to decrease  the outflow. The patient was sent home on  postoperative day #3 with the Jackson-Pratt drain in place as well as the  Foley catheter and was given instructions on how to use both of these. He  was sent home with Septra DS one daily, pain medication as needed as well as  all of his home medications. He will return to the office in one week for  staple removal.                                               Domingo Pulse, M.D.    RJE/MEDQ  D:  08/19/2002  T:  08/20/2002  Job:  [id number]   cc:  Freeman Caldron. Pauline Aus, M.D.  [address] [place]  [place] [address]  Fax: [place] Melvyn Novas, M.D. [facility]

## 2010-06-17 NOTE — Op Note (Signed)
[name], Lance Dillon                ACCOUNT NO.:  [id number]   MEDICAL RECORD NO.:  [id number]          PATIENT TYPE:  AMB   LOCATION:  ENDO                         FACILITY:  [facility]   PHYSICIAN:  Waverly Ferrari, M.D.    DATE OF BIRTH:  10/20/1940   DATE OF PROCEDURE:  08/25/2005  DATE OF DISCHARGE:                                 OPERATIVE REPORT   PROCEDURE:  Colonoscopy.   ENDOSCOPIST:  Waverly Ferrari, M.D.   INDICATIONS:  Colon polyp.   ANESTHESIA:  None given.   PROCEDURE INDICATIONS:  Firmness felt on rectal examination.   PROCEDURE:  With the patient in the left lateral decubitus position, a  rectal examination was performed and I felt a bony hardness in the rectum.  Subsequently, the Olympus videoscopic colonoscope was inserted into the  rectum, which appeared normal on direct view and showed small hemorrhoids on  retroflexed view, but no other abnormalities noted.  The endoscope was then  straightened and a small polyp was noted at approximately 20 cm from anal  verge; this was photographed and biopsied.  We then advanced the endoscope,  taking circumferential views of the colonic mucosa, with an attempt to do  colonoscopy.  At this time, we went to the hepatic flexure, at which point  the prep gave out; it had been excellent to that point, but then solid stool  was encountered which precluded doing a full colonoscopy to the cecum, so  therefore the colonoscope was then slowly withdrawn, taking circumferential  views of the remaining colonic mucosa, until we withdrew the endoscope.  The  patient's vital signs and pulse oximeter remained stable.  The patient  tolerated the procedure well without apparent complications.   FINDINGS:  Polyp at 20 cm, probably an incidental finding, no other  abnormalities noted on a subtotal colonoscopy.   PLAN:  We will await biopsy report and give consideration for full  colonoscopy, depending on biopsy results.     ______________________________  Waverly Ferrari, M.D.     GMO/MEDQ  D:  08/25/2005  T:  08/25/2005  Job:  [id number]   cc:   Domingo Pulse, M.D.  Fax: [phone]

## 2010-09-06 ENCOUNTER — Other Ambulatory Visit (INDEPENDENT_AMBULATORY_CARE_PROVIDER_SITE_OTHER): Payer: Medicare Other

## 2010-09-06 DIAGNOSIS — I6529 Occlusion and stenosis of unspecified carotid artery: Secondary | ICD-10-CM

## 2010-09-13 NOTE — Procedures (Unsigned)
CAROTID DUPLEX EXAM  INDICATION:  Followup carotid stenosis.  HISTORY: Diabetes:  No. Cardiac:  No. Hypertension:  Yes. Smoking:  Previous. Previous Surgery:  No carotid intervention. CV History:  Asymptomatic. Amaurosis Fugax No, Paresthesias No, Hemiparesis No                                      RIGHT             LEFT Brachial systolic pressure:         116               124 Brachial Doppler waveforms:         WNL               WNL Vertebral direction of flow:        Antegrade         Antegrade DUPLEX VELOCITIES (cm/sec) CCA peak systolic                   75                79 ECA peak systolic                   75                64 ICA peak systolic                   Occluded-P/23-M   [id number] ICA end diastolic                   Occluded-P/12-M   6-P/10-M PLAQUE MORPHOLOGY:                  Heterogeneous     Heterogeneous PLAQUE AMOUNT:                      Occlusive         Minimal PLAQUE LOCATION:                    Internal carotid artery             CCA, ICA, ECA  IMPRESSION: 1. 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 2. Left internal carotid artery low velocities were noted which is     consistent with distal arterial occlusive disease. 3. Patent bilateral external carotid arteries. 4. Patent antegrade bilateral vertebral arteries. 5. Essentially unchanged since previous study in 03/2007.      ___________________________________________ Jessy Oto. Fields, MD  SH/MEDQ  D:  09/06/2010  T:  09/06/2010  Job:  [id number]

## 2010-12-09 DIAGNOSIS — I10 Essential (primary) hypertension: Secondary | ICD-10-CM | POA: Insufficient documentation

## 2011-02-15 ENCOUNTER — Encounter: Payer: Self-pay | Admitting: Radiation Oncology

## 2011-02-15 NOTE — Progress Notes (Signed)
71 year old male  S/P radical retropubic prostatectomy on 07/15/2002.    TNM stage: cT1c N0 Mx  Gleason 3+4+7  Positive surgical margins  He has stress incontinence since his prostatectomy. Developed a bladder neck contracture postoperatively requiring transurethral incision. 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 He currently manages incontinence with [facility] clamp. 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 Patient has remained disease free until recently when his PSA gradually increased.    PSA September 2012 0.2 PSA January 2013 0.26  AX: Avelox No indication of radiation therapy in the past No indication of a pacemaker  Dr. Valere Dross previously treated patient's wife with radiation therapy.

## 2011-02-16 ENCOUNTER — Encounter: Payer: Self-pay | Admitting: Radiation Oncology

## 2011-02-16 ENCOUNTER — Ambulatory Visit
Admission: RE | Admit: 2011-02-16 | Discharge: 2011-02-16 | Disposition: A | Discharge status: Home / Self Care | Payer: Medicare Other | Source: Ambulatory Visit | Attending: Radiation Oncology | Admitting: Radiation Oncology

## 2011-02-16 VITALS — BP 127/74 | HR 98 | Temp 97.1°F | Resp 18 | Ht 69.0 in | Wt 181.1 lb

## 2011-02-16 DIAGNOSIS — E7800 Pure hypercholesterolemia, unspecified: Secondary | ICD-10-CM | POA: Insufficient documentation

## 2011-02-16 DIAGNOSIS — Z510 Encounter for antineoplastic radiation therapy: Secondary | ICD-10-CM | POA: Insufficient documentation

## 2011-02-16 DIAGNOSIS — Z7982 Long term (current) use of aspirin: Secondary | ICD-10-CM | POA: Insufficient documentation

## 2011-02-16 DIAGNOSIS — Z79899 Other long term (current) drug therapy: Secondary | ICD-10-CM | POA: Insufficient documentation

## 2011-02-16 DIAGNOSIS — Z8042 Family history of malignant neoplasm of prostate: Secondary | ICD-10-CM | POA: Insufficient documentation

## 2011-02-16 DIAGNOSIS — Z87891 Personal history of nicotine dependence: Secondary | ICD-10-CM | POA: Insufficient documentation

## 2011-02-16 DIAGNOSIS — C61 Malignant neoplasm of prostate: Secondary | ICD-10-CM | POA: Insufficient documentation

## 2011-02-16 DIAGNOSIS — I10 Essential (primary) hypertension: Secondary | ICD-10-CM | POA: Insufficient documentation

## 2011-02-16 HISTORY — DX: Unspecified macular degeneration: H35.30

## 2011-02-16 NOTE — Progress Notes (Signed)
Please see the Nurse Progress Note in the MD Initial Consult Encounter for this patient. 

## 2011-02-16 NOTE — Progress Notes (Signed)
Complete NUTRITION RISK SCREEN worksheet without concerns submitted to Ernestene Kiel, RD. Also, complete PATIENT MEASURE OF DISTRESS worksheet with a score of 0 submitted to social work.

## 2011-02-16 NOTE — Progress Notes (Signed)
Patient presents to the clinic today accompanied by his wife for an initial consultation with Dr. Valere Dross reference prostate ca. Patient is alert and oriented to person, place, and time. No distress noted. Steady gait noted. Pleasant affect noted. Patient denies pain at this time. IPSS score of 3 noted. Patient reports that it is difficulty to accurately complete the IPSS worksheet because of his use of the clamp on a daily basis. Patient denies nausea, vomiting, diarrhea, headache, blurred vision or dizziness. Patient has no complaints at this time. Reported all findings to Dr. Valere Dross.

## 2011-02-16 NOTE — Progress Notes (Signed)
[facility] NEW PATIENT EVALUATION  Name: Lance Dillon MRN: [id number]  Date: 02/16/2011  DOB: 1940-08-12  Status: outpatient   CC: Thressa Sheller, MD, MD  Dutch Gray, MD    REFERRING PHYSICIAN: Dutch Gray, MD   DIAGNOSIS: PSA recurrent carcinoma the prostate    HISTORY OF PRESENT ILLNESS:  Lance Dillon is a 71 y.o. male who is seen today for the courtesy of Dr. Alinda Money for discussion of possible salvage radiation therapy in the management of his PSA recurrent carcinoma the prostate. He underwent a retropubic radical prostatectomy by Dr. Amalia Hailey on 07/15/2002. He had Gleason 7 (3+4) involving both lobes with the apical margin focally involved by carcinoma. Remaining margins were not involved. Approximately 20% of the gland contained carcinoma. There was perineural invasion by tumor. 4 lymph nodes were free of metastatic disease. Seminal vesicles were not involved. He was given a pathologic stage of pT3. 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 He uses a Cunningham clamp. His PSA was 0.12 in March of 2012, rising to 0.2 by September 2012, and most recently 0.26 on 02/03/2011. He is doing well from a GI standpoint. He's had erectile dysfunction ever since his prostatectomy. He remains active and works daily.    PREVIOUS RADIATION THERAPY: No   PAST MEDICAL HISTORY:  has a past medical history of Hypercholesterolemia; Hypertension; Prostate cancer; and Macular degeneration.     PAST SURGICAL HISTORY:  Past Surgical History  Procedure Date  . Prostatectomy retropubic radical     with nerve sparing  . Penile prosthesis implant   . Penile prosthesis  removal   . Hernia repair   . Prostate biopsy      FAMILY HISTORY: family history includes Cancer in his brother. His  brother was diagnosed with prostate cancer at age 64 and is currently undergoing radiation therapy. His father died of a brain aneurysm at 79. His mother died of a heart attack in her early 78s.   SOCIAL HISTORY:  reports that he quit smoking about 9 years ago. His smoking use included Cigarettes. He has a 60 pack-year smoking history. He has never used smokeless tobacco. He reports that he does not drink alcohol or use illicit drugs. Married, 2 children. He works in Geophysicist/field seismologist.   ALLERGIES: Avelox   MEDICATIONS:  Current Outpatient Prescriptions  Medication Sig Dispense Refill  . amLODipine (NORVASC) 10 MG tablet Take 10 mg by mouth daily.      Marland Kitchen aspirin 81 MG tablet Take 81 mg by mouth daily.      . calcium-vitamin D 250-100 MG-UNIT per tablet Take 1 tablet by mouth 2 (two) times daily.      . fish oil-omega-3 fatty acids 1000 MG capsule Take 2 g by mouth daily.      Marland Kitchen losartan-hydrochlorothiazide (HYZAAR) 100-12.5 MG per tablet Take 1 tablet by mouth daily.      . Multiple Vitamin (MULTIVITAMIN) tablet Take 1 tablet by mouth daily.      . Multiple Vitamins-Minerals (ICAPS MV PO) Take by mouth.      . rosuvastatin (CRESTOR) 20 MG tablet Take 20 mg by mouth daily.          REVIEW OF SYSTEMS:  Pertinent items are noted in HPI.    PHYSICAL EXAM:  height is 5\' 9"  (1.753 m) and weight  is 181 lb 1.6 oz (82.146 kg). His oral temperature is 97.1 F (36.2 C). His blood pressure is 127/74 and his pulse is 98. His respiration is 18.   Head and neck exam: Grossly unremarkable. Nodes: Without palpable cervical or supraclavicular lymphadenopathy. Chest: Lungs clear. Back: Without spinal or CVA tenderness. Heart: Regular rate and rhythm. Abdomen: Lower midline abdominal scar, without masses or organomegaly. Genitalia: He has a Cunningham clamp. Rectal: There is some nodular scarring along the prostate bed, particularly along the right apical region. Extremities: Without edema.  Neurologic examination: Grossly nonfocal.    LABORATORY DATA:  No results found for this basename: WBC, HGB, HCT, MCV, PLT   No results found for this basename: NA, K, CL, CO2   No results found for this basename: ALT, AST, GGT, ALKPHOS, BILITOT   PSA 0.26 from 02/02/2010    IMPRESSION: Patient recurrent carcinoma the prostate. I explained to the patient and his wife that there are clinical predictors for having a local recurrence alone. These include a positive margin, disease free interval, slow PSA doubling time and favorable initial PSA/Gleason score. For the most part, he has all the predictors for having a local recurrence. I would certainly offer him potential salvage radiation therapy considering his good performance status. I discussed the potential acute and late toxicities of radiation therapy and he wishes to proceed as outlined. Consent was signed today.    PLAN: He'll return for treatment planning prior to his scheduled vacation cruise in mid February and then begin his radiation therapy later in February.   I spent 60 minutes minutes face to face with the patient and more than 50% of that time was spent in counseling and/or coordination of care.

## 2011-02-17 NOTE — Progress Notes (Signed)
Encounter addended by: Levy Pupa, RN on: 02/17/2011 11:46 AM<BR>     Documentation filed: Inpatient Patient Education, Charges VN

## 2011-03-13 ENCOUNTER — Ambulatory Visit
Admission: RE | Admit: 2011-03-13 | Discharge: 2011-03-13 | Disposition: A | Discharge status: Home / Self Care | Payer: Medicare Other | Source: Ambulatory Visit | Attending: Radiation Oncology | Admitting: Radiation Oncology

## 2011-03-13 DIAGNOSIS — C61 Malignant neoplasm of prostate: Secondary | ICD-10-CM

## 2011-03-13 NOTE — Progress Notes (Signed)
Met with patient to discuss RO billing.   Patient and wife concerned about cost.  Wanted to know cost of treatment and their approximate cost.  Told them I would get their OOP/deductible information together for their next visit. 03/14/11 10:17am  Spoke to [employer].  Patient's plan has $0 deductible with $3400 OOP max (currently met $108.79).  Coinsurance of 20% for diagnostic/radiology procedures; $0 for therapeutic radiology.  Notified patient of this information.

## 2011-03-13 NOTE — Progress Notes (Signed)
Simulation/treatment planning note: Lance Dillon was taken to the CT simulator. An alpha cradle was constructed for immobilization. A red rubber tube and was placed within the rectal vault. He was catheterized and contrast instilled into the bladder and urethra. He was then scanned. I contoured his high risk prostate tumor bed, CTV 6 600 and also his low risk prostate tumor bed, CTV 5445. Each of these will be expanded by 0.5 cm to create respective PTV's I prescribing 6000 60 cGy in 33 sessions to his high-risk prostate tumor bed and 5445 cGy to his his prostate bed. His bladder, rectum, femoral heads her to be contoured as well. He'll be treated with 6 MV photons, helical IMRT Tomotherapy.

## 2011-03-15 ENCOUNTER — Encounter: Payer: Self-pay | Admitting: *Deleted

## 2011-03-15 NOTE — Progress Notes (Signed)
02/16/11 I-PSS=[id number] score=3

## 2011-03-16 ENCOUNTER — Encounter: Payer: Self-pay | Admitting: Radiation Oncology

## 2011-03-16 NOTE — Progress Notes (Signed)
IMRT simulation/treatment planning note  The patient underwent IMRT simulation/treatment planning in the management of his PSA recurrent carcinoma the prostate. IMRT was chosen compared to conventional or 3-D conformal radiation therapy to reduce the risk for both acute and late bladder and rectal toxicity. Dose volume histograms were obtained for our target structures including a high-risk prostate bed and low his prostate bed in addition to structures including the rectum, bladder, and femoral heads. I am prescribing 6600 and her centigray in 33 sessions utilizing helical IMRT Tomotherapy. Please see the electronic medical record for specific dose volume histograms.

## 2011-03-22 ENCOUNTER — Encounter: Payer: Self-pay | Admitting: Radiation Oncology

## 2011-03-22 ENCOUNTER — Ambulatory Visit
Admission: RE | Admit: 2011-03-22 | Discharge: 2011-03-22 | Disposition: A | Discharge status: Home / Self Care | Payer: Medicare Other | Source: Ambulatory Visit | Attending: Radiation Oncology | Admitting: Radiation Oncology

## 2011-03-22 NOTE — Progress Notes (Signed)
Chart note: The patient underwent Tomotherapy segmentation today and he is being treated to 5.6 delivered field widths corresponding to one set of IMRT treatment devices [phone]).

## 2011-03-23 ENCOUNTER — Ambulatory Visit
Admission: RE | Admit: 2011-03-23 | Discharge: 2011-03-23 | Disposition: A | Discharge status: Home / Self Care | Payer: Medicare Other | Source: Ambulatory Visit | Attending: Radiation Oncology | Admitting: Radiation Oncology

## 2011-03-24 ENCOUNTER — Ambulatory Visit
Admission: RE | Admit: 2011-03-24 | Discharge: 2011-03-24 | Disposition: A | Discharge status: Home / Self Care | Payer: Medicare Other | Source: Ambulatory Visit | Attending: Radiation Oncology | Admitting: Radiation Oncology

## 2011-03-24 VITALS — Wt 180.2 lb

## 2011-03-24 DIAGNOSIS — C61 Malignant neoplasm of prostate: Secondary | ICD-10-CM

## 2011-03-24 NOTE — Progress Notes (Signed)
Post sim ed completed. Gave pt "Radiation and You" booklet, all questions answered.

## 2011-03-27 ENCOUNTER — Ambulatory Visit
Admission: RE | Admit: 2011-03-27 | Discharge: 2011-03-27 | Disposition: A | Discharge status: Home / Self Care | Payer: Medicare Other | Source: Ambulatory Visit | Attending: Radiation Oncology | Admitting: Radiation Oncology

## 2011-03-27 DIAGNOSIS — C61 Malignant neoplasm of prostate: Secondary | ICD-10-CM

## 2011-03-27 NOTE — Progress Notes (Signed)
Here for routine weekly under treat visit for prostate ca. Has completed  4 of 33 treatments. Denies any side effects.  Nocturia unchanged at least 3 times.

## 2011-03-27 NOTE — Progress Notes (Signed)
Weekly Management Note:  Site:Prostate bed Current Dose:  800  cGy Projected Dose: 6600  cGy  Narrative: The patient is seen today for routine under treatment assessment. CBCT/MVCT images/port films were reviewed. The chart was reviewed.   Bladder filling is only approximately 50%. It is less than satisfactory. New GU or GI difficulties.  Physical Examination: There were no vitals filed for this visit..  Weight:  . No change  Impression: Tolerating radiation therapy well. I encouraged him to improve his bladder filling.  Plan: Continue radiation therapy as planned.

## 2011-03-28 ENCOUNTER — Ambulatory Visit
Admission: RE | Admit: 2011-03-28 | Discharge: 2011-03-28 | Disposition: A | Discharge status: Home / Self Care | Payer: Medicare Other | Source: Ambulatory Visit | Attending: Radiation Oncology | Admitting: Radiation Oncology

## 2011-03-29 ENCOUNTER — Ambulatory Visit
Admission: RE | Admit: 2011-03-29 | Discharge: 2011-03-29 | Disposition: A | Discharge status: Home / Self Care | Payer: Medicare Other | Source: Ambulatory Visit | Attending: Radiation Oncology | Admitting: Radiation Oncology

## 2011-03-30 ENCOUNTER — Ambulatory Visit
Admission: RE | Admit: 2011-03-30 | Discharge: 2011-03-30 | Disposition: A | Discharge status: Home / Self Care | Payer: Medicare Other | Source: Ambulatory Visit | Attending: Radiation Oncology | Admitting: Radiation Oncology

## 2011-03-31 ENCOUNTER — Ambulatory Visit
Admission: RE | Admit: 2011-03-31 | Discharge: 2011-03-31 | Disposition: A | Discharge status: Home / Self Care | Payer: Medicare Other | Source: Ambulatory Visit | Attending: Radiation Oncology | Admitting: Radiation Oncology

## 2011-04-03 ENCOUNTER — Ambulatory Visit
Admission: RE | Admit: 2011-04-03 | Discharge: 2011-04-03 | Disposition: A | Discharge status: Home / Self Care | Payer: Medicare Other | Source: Ambulatory Visit | Attending: Radiation Oncology | Admitting: Radiation Oncology

## 2011-04-03 VITALS — Wt 179.9 lb

## 2011-04-03 DIAGNOSIS — C61 Malignant neoplasm of prostate: Secondary | ICD-10-CM

## 2011-04-03 NOTE — Progress Notes (Signed)
Weekly Management Note:  Site:Prosate bed Current Dose:  1800  cGy Projected Dose: 6600  cGy  Narrative: The patient is seen today for routine under treatment assessment. CBCT/MVCT images/port films were reviewed. The chart was reviewed. Bladder filling is satisfactory.  No significant GU or GI difficulties. He does report nocturia x3 which is his baseline.  Physical Examination: There were no vitals filed for this visit..  Weight: 179 lb 14.4 oz (81.602 kg). No change.  Impression: Tolerating radiation therapy well.  Plan: Continue radiation therapy as planned.

## 2011-04-03 NOTE — Progress Notes (Signed)
Here for put today with no c/o.  States appetite is good, no burning with urination, has a little urgency now and then, gets up about 3x a night

## 2011-04-04 ENCOUNTER — Ambulatory Visit
Admission: RE | Admit: 2011-04-04 | Discharge: 2011-04-04 | Disposition: A | Discharge status: Home / Self Care | Payer: Medicare Other | Source: Ambulatory Visit | Attending: Radiation Oncology | Admitting: Radiation Oncology

## 2011-04-05 ENCOUNTER — Ambulatory Visit
Admission: RE | Admit: 2011-04-05 | Discharge: 2011-04-05 | Disposition: A | Discharge status: Home / Self Care | Payer: Medicare Other | Source: Ambulatory Visit | Attending: Radiation Oncology | Admitting: Radiation Oncology

## 2011-04-06 ENCOUNTER — Ambulatory Visit: Payer: Medicare Other

## 2011-04-07 ENCOUNTER — Ambulatory Visit
Admission: RE | Admit: 2011-04-07 | Discharge: 2011-04-07 | Disposition: A | Discharge status: Home / Self Care | Payer: Medicare Other | Source: Ambulatory Visit | Attending: Radiation Oncology | Admitting: Radiation Oncology

## 2011-04-10 ENCOUNTER — Ambulatory Visit
Admission: RE | Admit: 2011-04-10 | Discharge: 2011-04-10 | Disposition: A | Discharge status: Home / Self Care | Payer: Medicare Other | Source: Ambulatory Visit | Attending: Radiation Oncology | Admitting: Radiation Oncology

## 2011-04-10 VITALS — Wt 179.9 lb

## 2011-04-10 DIAGNOSIS — C61 Malignant neoplasm of prostate: Secondary | ICD-10-CM

## 2011-04-10 NOTE — Progress Notes (Signed)
Here for routine weekly md visit for prostate cancer. Has completed 13 of 33 treatments. Denies pain or burning on urination. Has some mild fatigue.

## 2011-04-10 NOTE — Progress Notes (Signed)
Weekly Management Note:  Site:Prostate bed Current Dose:  2600  cGy Projected Dose: 6600  cGy  Narrative: The patient is seen today for routine under treatment assessment. CBCT/MVCT images/port films were reviewed. The chart was reviewed.   Bladder filling is satisfactory, but I think he can improve. I believe that he is making a good effort. No GU or GI difficulties.  Physical Examination: There were no vitals filed for this visit..  Weight: 179 lb 14.4 oz (81.602 kg). No change.  Impression: Tolerating radiation therapy well.  Plan: Continue radiation therapy as planned.

## 2011-04-11 ENCOUNTER — Ambulatory Visit
Admission: RE | Admit: 2011-04-11 | Discharge: 2011-04-11 | Disposition: A | Discharge status: Home / Self Care | Payer: Medicare Other | Source: Ambulatory Visit | Attending: Radiation Oncology | Admitting: Radiation Oncology

## 2011-04-12 ENCOUNTER — Ambulatory Visit
Admission: RE | Admit: 2011-04-12 | Discharge: 2011-04-12 | Disposition: A | Discharge status: Home / Self Care | Payer: Medicare Other | Source: Ambulatory Visit | Attending: Radiation Oncology | Admitting: Radiation Oncology

## 2011-04-13 ENCOUNTER — Ambulatory Visit
Admission: RE | Admit: 2011-04-13 | Discharge: 2011-04-13 | Disposition: A | Discharge status: Home / Self Care | Payer: Medicare Other | Source: Ambulatory Visit | Attending: Radiation Oncology | Admitting: Radiation Oncology

## 2011-04-14 ENCOUNTER — Ambulatory Visit
Admission: RE | Admit: 2011-04-14 | Discharge: 2011-04-14 | Disposition: A | Discharge status: Home / Self Care | Payer: Medicare Other | Source: Ambulatory Visit | Attending: Radiation Oncology | Admitting: Radiation Oncology

## 2011-04-17 ENCOUNTER — Ambulatory Visit
Admission: RE | Admit: 2011-04-17 | Discharge: 2011-04-17 | Disposition: A | Discharge status: Home / Self Care | Payer: Medicare Other | Source: Ambulatory Visit | Attending: Radiation Oncology | Admitting: Radiation Oncology

## 2011-04-18 ENCOUNTER — Ambulatory Visit
Admission: RE | Admit: 2011-04-18 | Discharge: 2011-04-18 | Disposition: A | Discharge status: Home / Self Care | Payer: Medicare Other | Source: Ambulatory Visit | Attending: Radiation Oncology | Admitting: Radiation Oncology

## 2011-04-18 VITALS — Wt 179.2 lb

## 2011-04-18 DIAGNOSIS — C61 Malignant neoplasm of prostate: Secondary | ICD-10-CM

## 2011-04-18 NOTE — Progress Notes (Signed)
Here for routine weekly under treat visit with MD.Denies pain. Sensation of having to void again after voiding. No burning on urination. No changes in bowel patterns.Fatigue at end of day only.

## 2011-04-18 NOTE — Progress Notes (Signed)
Weekly Management Note:  Site:Prostate bed Current Dose:  3800  cGy Projected Dose: 6600  cGy  Narrative: The patient is seen today for routine under treatment assessment. CBCT/MVCT images/port films were reviewed. The chart was reviewed.   Good bladder filling. No significant GU or GI problems.  Physical Examination: There were no vitals filed for this visit..  Weight: 179 lb 3.2 oz (81.285 kg). No change.  Impression: Tolerating radiation therapy well.  Plan: Continue radiation therapy as planned.

## 2011-04-19 ENCOUNTER — Ambulatory Visit
Admission: RE | Admit: 2011-04-19 | Discharge: 2011-04-19 | Disposition: A | Discharge status: Home / Self Care | Payer: Medicare Other | Source: Ambulatory Visit | Attending: Radiation Oncology | Admitting: Radiation Oncology

## 2011-04-20 ENCOUNTER — Ambulatory Visit
Admission: RE | Admit: 2011-04-20 | Discharge: 2011-04-20 | Disposition: A | Discharge status: Home / Self Care | Payer: Medicare Other | Source: Ambulatory Visit | Attending: Radiation Oncology | Admitting: Radiation Oncology

## 2011-04-21 ENCOUNTER — Ambulatory Visit
Admission: RE | Admit: 2011-04-21 | Discharge: 2011-04-21 | Disposition: A | Discharge status: Home / Self Care | Payer: Medicare Other | Source: Ambulatory Visit | Attending: Radiation Oncology | Admitting: Radiation Oncology

## 2011-04-24 ENCOUNTER — Ambulatory Visit
Admission: RE | Admit: 2011-04-24 | Discharge: 2011-04-24 | Disposition: A | Discharge status: Home / Self Care | Payer: Medicare Other | Source: Ambulatory Visit | Attending: Radiation Oncology | Admitting: Radiation Oncology

## 2011-04-24 ENCOUNTER — Encounter: Payer: Self-pay | Admitting: Radiation Oncology

## 2011-04-24 VITALS — Wt 179.8 lb

## 2011-04-24 DIAGNOSIS — C61 Malignant neoplasm of prostate: Secondary | ICD-10-CM

## 2011-04-24 NOTE — Progress Notes (Signed)
Pt reports fatigue in afternoons; takes nap. States BMs ocass soft. Also has feeling of "needing to continue to urinate after he's finished." Denies dysuria.

## 2011-04-24 NOTE — Progress Notes (Signed)
Weekly Management Note:  Site:Prostate bed Current Dose:  4600  cGy Projected Dose: 6600  cGy  Narrative: The patient is seen today for routine under treatment assessment. CBCT/MVCT images/port films were reviewed. The chart was reviewed.   Satisfactory bladder filling. He does have a sensation of incomplete voiding. His force of stream is excellent. No GI difficulties.  Physical Examination: There were no vitals filed for this visit..  Weight: 179 lb 12.8 oz (81.557 kg). No change.  Impression: Tolerating radiation therapy well.  Plan: Continue radiation therapy as planned.

## 2011-04-25 ENCOUNTER — Ambulatory Visit
Admission: RE | Admit: 2011-04-25 | Discharge: 2011-04-25 | Disposition: A | Discharge status: Home / Self Care | Payer: Medicare Other | Source: Ambulatory Visit | Attending: Radiation Oncology | Admitting: Radiation Oncology

## 2011-04-26 ENCOUNTER — Ambulatory Visit
Admission: RE | Admit: 2011-04-26 | Discharge: 2011-04-26 | Disposition: A | Discharge status: Home / Self Care | Payer: Medicare Other | Source: Ambulatory Visit | Attending: Radiation Oncology | Admitting: Radiation Oncology

## 2011-04-27 ENCOUNTER — Ambulatory Visit
Admission: RE | Admit: 2011-04-27 | Discharge: 2011-04-27 | Disposition: A | Discharge status: Home / Self Care | Payer: Medicare Other | Source: Ambulatory Visit | Attending: Radiation Oncology | Admitting: Radiation Oncology

## 2011-04-28 ENCOUNTER — Ambulatory Visit
Admission: RE | Admit: 2011-04-28 | Discharge: 2011-04-28 | Disposition: A | Discharge status: Home / Self Care | Payer: Medicare Other | Source: Ambulatory Visit | Attending: Radiation Oncology | Admitting: Radiation Oncology

## 2011-05-01 ENCOUNTER — Encounter: Payer: Self-pay | Admitting: Radiation Oncology

## 2011-05-01 ENCOUNTER — Ambulatory Visit
Admission: RE | Admit: 2011-05-01 | Discharge: 2011-05-01 | Disposition: A | Discharge status: Home / Self Care | Payer: Medicare Other | Source: Ambulatory Visit | Attending: Radiation Oncology | Admitting: Radiation Oncology

## 2011-05-01 DIAGNOSIS — C61 Malignant neoplasm of prostate: Secondary | ICD-10-CM

## 2011-05-01 NOTE — Progress Notes (Signed)
   Weekly Management Note, prostate cancer Current Dose:   5600 cGy  Projected Dose: 6600 cGy   Narrative:  The patient presents for routine under treatment assessment.  CBCT/MVCT images/Port film x-rays were reviewed.  The chart was checked. He is doing fairly well. He is a little bit of burning at the end of urination. He has itching in the upper pelvic/lower area. Nocturia 2-3 times a night consistent with base line  Physical Findings: Weight:  . He is in no acute distress. Skin in the lower abdomen/upper pelvis is slightly erythematous. Unclear if this is actually from the radiotherapy and may be due to a tight belt line  Impression:  The patient is tolerating radiotherapy.  Plan:  Continue radiotherapy as planned. I offered radiaplex for his skin but he is not interested in this. He reports that the itching is not that bothersome

## 2011-05-01 NOTE — Progress Notes (Signed)
HERE FOR PUT TODAY, HAS SOME ITCHING IN UPPER PELVIC AREA.  ENERGY IS GOOD BUT SEEMS TO DECREASE IN EVENING.  HAS A LITTLE BURNING AT END OF URINATION BUT NOT ALL THE TIME

## 2011-05-02 ENCOUNTER — Ambulatory Visit
Admission: RE | Admit: 2011-05-02 | Discharge: 2011-05-02 | Disposition: A | Discharge status: Home / Self Care | Payer: Medicare Other | Source: Ambulatory Visit | Attending: Radiation Oncology | Admitting: Radiation Oncology

## 2011-05-03 ENCOUNTER — Ambulatory Visit
Admission: RE | Admit: 2011-05-03 | Discharge: 2011-05-03 | Disposition: A | Discharge status: Home / Self Care | Payer: Medicare Other | Source: Ambulatory Visit | Attending: Radiation Oncology | Admitting: Radiation Oncology

## 2011-05-03 ENCOUNTER — Ambulatory Visit: Admission: RE | Admit: 2011-05-03 | Payer: Medicare Other | Source: Ambulatory Visit

## 2011-05-04 ENCOUNTER — Ambulatory Visit
Admission: RE | Admit: 2011-05-04 | Discharge: 2011-05-04 | Disposition: A | Discharge status: Home / Self Care | Payer: Medicare Other | Source: Ambulatory Visit | Attending: Radiation Oncology | Admitting: Radiation Oncology

## 2011-05-05 ENCOUNTER — Ambulatory Visit
Admission: RE | Admit: 2011-05-05 | Discharge: 2011-05-05 | Disposition: A | Discharge status: Home / Self Care | Payer: Medicare Other | Source: Ambulatory Visit | Attending: Radiation Oncology | Admitting: Radiation Oncology

## 2011-05-08 ENCOUNTER — Encounter: Payer: Self-pay | Admitting: Radiation Oncology

## 2011-05-08 ENCOUNTER — Ambulatory Visit
Admission: RE | Admit: 2011-05-08 | Discharge: 2011-05-08 | Disposition: A | Discharge status: Home / Self Care | Payer: Medicare Other | Source: Ambulatory Visit | Attending: Radiation Oncology | Admitting: Radiation Oncology

## 2011-05-08 ENCOUNTER — Telehealth: Payer: Self-pay | Admitting: Radiation Oncology

## 2011-05-08 VITALS — Wt 176.6 lb

## 2011-05-08 DIAGNOSIS — C61 Malignant neoplasm of prostate: Secondary | ICD-10-CM

## 2011-05-08 NOTE — Telephone Encounter (Signed)
Returned call of Inez Catalina, patient's wife. Per Dr. Charlton Amor order instructed Inez Catalina that her husband could now have the "shots in his eyes" for macular degeneration. Inez Catalina verbalized understanding.

## 2011-05-08 NOTE — Progress Notes (Signed)
Weekly Management Note:  Site: Prostate bed Current Dose:  6600  cGy Projected Dose: 6600  cGy  Narrative: The patient is seen today for routine under treatment assessment. CBCT/MVCT images/port films were reviewed. The chart was reviewed. Satisfactory bladder filling. No significant GU or GI difficulties.  Physical Examination: There were no vitals filed for this visit..  Weight: 176 lb 9.6 oz (80.105 kg). No change.  Impression: Tolerated radiation therapy well.  Plan: Radiation therapy completed. Followup visit in one month. He'll maintain his followup visits with Dr. Alinda Money along with PSA determinations.

## 2011-05-08 NOTE — Progress Notes (Signed)
[facility] End of Treatment Note  Name:Lance Dillon  Date: 05/08/2011 [id number] DOB:March 21, 1940   Status:outpatient    CC: Thressa Sheller, MD, MD  Dr. Raynelle Bring  REFERRING PHYSICIAN: Dr. Raynelle Bring    DIAGNOSIS: PSA recurrent carcinoma of the prostate   INDICATION FOR TREATMENT: Curative   TREATMENT DATES: 03/22/2011 through 05/08/2011                          SITE/DOSE:   High risk prostate bed 6600 cGy 33 sessions, low-risk prostate bed 5445 cGy 33 sessions                         BEAMS/ENERGY:   6 MV photons helical IMRT Tomotherapy. Daily image guidance with MV CT               NARRATIVE: The patient tolerated treatment well with no significant GU or GI toxicity during his course of therapy.                           PLAN: Routine followup in one month. Patient instructed to call if questions or worsening complaints in interim. He'll maintain his followup with Dr. Raynelle Bring.

## 2011-05-08 NOTE — Progress Notes (Signed)
Pt weight 176.6, completed 33/33 prostate rad txs, pt alert and oriented x 3, slight itchiness around abdomen, no other c/o, 1 month f/u appt given

## 2011-05-15 ENCOUNTER — Other Ambulatory Visit: Payer: Self-pay

## 2011-05-15 DIAGNOSIS — I70219 Atherosclerosis of native arteries of extremities with intermittent claudication, unspecified extremity: Secondary | ICD-10-CM

## 2011-06-05 ENCOUNTER — Encounter: Payer: Self-pay | Admitting: Radiation Oncology

## 2011-06-07 ENCOUNTER — Encounter: Payer: Self-pay | Admitting: Radiation Oncology

## 2011-06-07 ENCOUNTER — Ambulatory Visit
Admission: RE | Admit: 2011-06-07 | Discharge: 2011-06-07 | Disposition: A | Discharge status: Home / Self Care | Payer: Medicare Other | Source: Ambulatory Visit | Attending: Radiation Oncology | Admitting: Radiation Oncology

## 2011-06-07 VITALS — BP 137/85 | HR 81 | Resp 20 | Wt 172.6 lb

## 2011-06-07 DIAGNOSIS — C61 Malignant neoplasm of prostate: Secondary | ICD-10-CM

## 2011-06-07 NOTE — Progress Notes (Signed)
Pt denies bladder, bowels issues; still has some lingering fatigue in afternoons.  Does report some slight  feeling of urinary "pressure" after he voids.

## 2011-06-07 NOTE — Progress Notes (Signed)
Followup note:  [name] returns today approximately 1 month following completion of IMRT in the management of his PSA recurrent carcinoma the prostate. He is doing well from a GU and GI standpoint. He still uses his Lance Dillon clamp. He tells me he'll see Dr. Alinda Dillon for a followup visit and late May.  Physical examination: Not performed.  Impression: Satisfactory progress with no significant symptomatology from his radiation therapy.  Plan: He'll have a PSA later this month and then see Dr. Alinda Dillon for a followup visit on May 31. I've not scheduled [name] for a formal followup visit and I ask that Dr. Alinda Dillon keep me posted on his progress.

## 2011-06-12 ENCOUNTER — Encounter: Payer: Self-pay | Admitting: Vascular Surgery

## 2011-06-13 ENCOUNTER — Other Ambulatory Visit (INDEPENDENT_AMBULATORY_CARE_PROVIDER_SITE_OTHER): Payer: Medicare Other | Admitting: *Deleted

## 2011-06-13 ENCOUNTER — Ambulatory Visit (INDEPENDENT_AMBULATORY_CARE_PROVIDER_SITE_OTHER): Payer: Medicare Other | Admitting: Vascular Surgery

## 2011-06-13 ENCOUNTER — Encounter: Payer: Self-pay | Admitting: Vascular Surgery

## 2011-06-13 ENCOUNTER — Encounter (INDEPENDENT_AMBULATORY_CARE_PROVIDER_SITE_OTHER): Payer: Medicare Other | Admitting: Vascular Surgery

## 2011-06-13 ENCOUNTER — Encounter (HOSPITAL_COMMUNITY): Payer: Self-pay | Admitting: Pharmacy Technician

## 2011-06-13 ENCOUNTER — Other Ambulatory Visit: Payer: Self-pay

## 2011-06-13 ENCOUNTER — Ambulatory Visit (INDEPENDENT_AMBULATORY_CARE_PROVIDER_SITE_OTHER): Payer: Medicare Other | Admitting: *Deleted

## 2011-06-13 VITALS — BP 131/81 | HR 86 | Resp 18 | Ht 68.0 in | Wt 171.0 lb

## 2011-06-13 DIAGNOSIS — I6529 Occlusion and stenosis of unspecified carotid artery: Secondary | ICD-10-CM

## 2011-06-13 DIAGNOSIS — I70219 Atherosclerosis of native arteries of extremities with intermittent claudication, unspecified extremity: Secondary | ICD-10-CM

## 2011-06-13 DIAGNOSIS — M79609 Pain in unspecified limb: Secondary | ICD-10-CM

## 2011-06-13 NOTE — Progress Notes (Signed)
Subjective:     Patient ID: Lance Dillon, male   DOB: [birth year], 71 y.o.   MRN: [id number]  HPI this 71 year old male is known to our practice from previous evaluation for claudication in the right leg. 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 He is able to walk a couple of blocks before having to stop and rest. He has no history of rest pain, nonhealing ulcers, gangrene, or infection. He also has a known right ICA occlusion but no history of CVA. His left ICA has been followed with no significant stenosis.  Past Medical History  Diagnosis Date  . Hypercholesterolemia   . Hypertension   . Prostate cancer   . Macular degeneration   . Hx of radiation therapy 03/22/11 to 05/08/11    PSA recurrent carcinoma of prostate    History  Substance Use Topics  . Smoking status: Former Smoker -- 3.0 packs/day for 20 years    Types: Cigarettes    Quit date: 01/30/2002  . Smokeless tobacco: Never Used  . Alcohol Use: No    Family History  Problem Relation Age of Onset  . Cancer Brother 40    prostate    Allergies  Allergen Reactions  . Avelox (Moxifloxacin Hcl In Nacl)     diarrhea    Current outpatient prescriptions:amLODipine (NORVASC) 10 MG tablet, Take 10 mg by mouth daily., Disp: , Rfl: ;  aspirin 81 MG tablet, Take 81 mg by mouth daily., Disp: , Rfl: ;  calcium-vitamin D 250-100 MG-UNIT per tablet, Take 1 tablet by mouth 2 (two) times daily., Disp: , Rfl: ;  fish oil-omega-3 fatty acids 1000 MG capsule, Take 2 g by mouth daily., Disp: , Rfl:  losartan-hydrochlorothiazide (HYZAAR) 100-12.5 MG per tablet, Take 1 tablet by mouth daily., Disp: , Rfl: ;  Multiple Vitamin (MULTIVITAMIN) tablet, Take 1 tablet by mouth daily., Disp: , Rfl: ;  rosuvastatin (CRESTOR) 20 MG tablet, Take 10 mg by mouth daily. , Disp: , Rfl: ;  Multiple Vitamins-Minerals (ICAPS MV PO), Take by mouth., Disp: , Rfl:   BP 131/81  Pulse 86  Resp 18  Ht 5\' 8"   (1.727 m)  Wt 171 lb (77.565 kg)  BMI 26.00 kg/m2  Body mass index is 26.00 kg/(m^2).          Review of Systems denies chest pain, dyspnea on exertion, PND, orthopnea, hemoptysis-all systems negative and complete review of systems     Objective:   Physical Exam blood pressure 131/81 heart rate 86 respirations 18 Gen.-alert and oriented x3 in no apparent distress HEENT normal for age Lungs no rhonchi or wheezing Cardiovascular regular rhythm no murmurs carotid pulses 3+ palpable no bruits audible Abdomen soft nontender no palpable masses Musculoskeletal free of  major deformities Skin clear -no rashes Neurologic normal Lower extremities 3+ femoral pulse on the left 2+ on the right-absent popliteal and distal pulses in right-1-2+ left popliteal pulse palpable Both the well-perfused  Today I ordered lower extremity arterial Dopplers which are reviewed and interpreted. ABI on the right is 0.61 similar to 2 years ago. ABI on the left is 0.80.      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 Trula Slade on Tuesday, May 21

## 2011-06-20 ENCOUNTER — Telehealth: Payer: Self-pay | Admitting: Vascular Surgery

## 2011-06-20 ENCOUNTER — Encounter (HOSPITAL_COMMUNITY)
Admission: RE | Disposition: A | Discharge status: Home / Self Care | Payer: Self-pay | Source: Ambulatory Visit | Attending: Surgery

## 2011-06-20 ENCOUNTER — Ambulatory Visit (HOSPITAL_COMMUNITY)
Admission: RE | Admit: 2011-06-20 | Discharge: 2011-06-20 | Disposition: A | Discharge status: Home / Self Care | Payer: Medicare Other | Source: Ambulatory Visit | Attending: Surgery | Admitting: Surgery

## 2011-06-20 DIAGNOSIS — I70219 Atherosclerosis of native arteries of extremities with intermittent claudication, unspecified extremity: Secondary | ICD-10-CM | POA: Insufficient documentation

## 2011-06-20 DIAGNOSIS — I10 Essential (primary) hypertension: Secondary | ICD-10-CM | POA: Insufficient documentation

## 2011-06-20 DIAGNOSIS — H3530 Unspecified macular degeneration: Secondary | ICD-10-CM | POA: Insufficient documentation

## 2011-06-20 DIAGNOSIS — I701 Atherosclerosis of renal artery: Secondary | ICD-10-CM | POA: Insufficient documentation

## 2011-06-20 DIAGNOSIS — E7800 Pure hypercholesterolemia, unspecified: Secondary | ICD-10-CM | POA: Insufficient documentation

## 2011-06-20 DIAGNOSIS — Z8546 Personal history of malignant neoplasm of prostate: Secondary | ICD-10-CM | POA: Insufficient documentation

## 2011-06-20 HISTORY — PX: ABDOMINAL AORTAGRAM: SHX5454

## 2011-06-20 LAB — POCT I-STAT, CHEM 8
Creatinine, Ser: 0.9 mg/dL (ref 0.50–1.35)
Glucose, Bld: 103 mg/dL — ABNORMAL HIGH (ref 70–99)
Hemoglobin: 12.6 g/dL — ABNORMAL LOW (ref 13.0–17.0)
TCO2: 26 mmol/L (ref 0–100)

## 2011-06-20 SURGERY — ABDOMINAL AORTAGRAM
Anesthesia: LOCAL

## 2011-06-20 MED ORDER — MIDAZOLAM HCL 2 MG/2ML IJ SOLN
INTRAMUSCULAR | Status: AC
  Filled 2011-06-20: qty 2

## 2011-06-20 MED ORDER — LIDOCAINE HCL (PF) 1 % IJ SOLN
INTRAMUSCULAR | Status: AC
  Filled 2011-06-20: qty 30

## 2011-06-20 MED ORDER — SODIUM CHLORIDE 0.9 % IV SOLN
INTRAVENOUS | Status: DC
  Administered 2011-06-20: 06:00:00 via INTRAVENOUS

## 2011-06-20 MED ORDER — FENTANYL CITRATE 0.05 MG/ML IJ SOLN
INTRAMUSCULAR | Status: AC
  Filled 2011-06-20: qty 2

## 2011-06-20 MED ORDER — HEPARIN (PORCINE) IN NACL 2-0.9 UNIT/ML-% IJ SOLN
INTRAMUSCULAR | Status: AC
  Filled 2011-06-20: qty 1000

## 2011-06-20 NOTE — Op Note (Signed)
[facility]  Patient name: Lance Dillon MRN: [id number] DOB: Aug 24, 1940 Sex: male  06/20/2011 Pre-operative Diagnosis: Bilateral claudication Post-operative diagnosis:  Same Surgeon:  Eldridge Abrahams Procedure Performed:  1.  ultrasound access left femoral artery  2.  abdominal aortogram  3.  bilateral lower extremity runoff  4.  second order catheterization   Indications:  The patient comes in with lifestyle limiting claudication, right greater than left. Duplex revealed significant decrease in his ankle brachial indices. He comes in today for diagnostic images and possible intervention.  Procedure:  The patient was identified in the holding area and taken to room 8.  The patient was then placed supine on the table and prepped and draped in the usual sterile fashion.  A time out was called.  Ultrasound was used to evaluate the left common femoral artery.  It was patent .  A digital ultrasound image was acquired.  A micropuncture needle was used to access the left common femoral artery under ultrasound guidance.  An 018 wire was advanced without resistance and a micropuncture sheath was placed.  The 018 wire was removed and a benson wire was placed.  The micropuncture sheath was exchanged for a 5 french sheath.  An omniflush catheter was advanced over the wire to the level of L-1.  An abdominal angiogram was obtained.  Next, using the omniflush catheter and a benson wire, the aortic bifurcation was crossed and the catheter was placed into theright external iliac artery and right runoff was obtained.  left runoff was performed via retrograde sheath injections.  Findings:   Aortogram:  The visualized portions of the suprarenal abdominal aorta showed no significant disease. There is a plaque at the origin of the left renal artery creating approximately 70% stenosis. The right renal artery ostium is not well visualized but appears to be widely patent. No  significant stenosis is identified within the infrarenal abdominal aorta. No significant stenosis is identified within the bilateral common external and internal iliac arteries.  Right Lower Extremity:  The right common femoral artery is heavily calcified. There is a high grade, greater than 95% stenosis at the distal common femoral artery. The profunda femoral artery is patent. The superficial femoral artery is diffusely diseased throughout and heavily calcified with several areas of subtotal occlusion. There is reconstitution with a relatively normal contour of contrast in the above-knee popliteal artery however, this remains heavily calcified. The below knee popliteal artery is patent throughout it's course with single-vessel runoff via the peroneal artery.  Left Lower Extremity:  The left common femoral artery is calcified. The profunda femoral artery is patent throughout it's course. Left superficial femoral artery has multiple areas of subtotal occlusion/occlusion. It is heavily calcified. There is reconstitution of the superficial femoral artery at the level of the adductor canal. The above-knee popliteal artery is patent but remains heavily calcified. The below knee popliteal artery is patent throughout it's course with single-vessel runoff via the peroneal artery.  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 There is reconstitution of the above-knee popliteal artery however this is heavily calcified. There is single vessel runoff via the peroneal artery  #2  occluded left superficial femoral artery with reconstitution of the above-knee popliteal artery which is heavily calcified. The below knee popliteal artery is widely patent. There single-vessel runoff  via the peroneal artery.  #3  high-grade left renal  artery stenosis    V. Annamarie Major, M.D. [facility] Office: [phone] Pager:  [phone]

## 2011-06-20 NOTE — Telephone Encounter (Signed)
Message copied by Gena Fray on Tue Jun 20, 2011 12:33 PM ------      Message from: Alfonso Patten      Created: Tue Jun 20, 2011 10:39 AM                   ----- Message -----         From: Serafina Mitchell, MD         Sent: 06/20/2011   7:51 AM           To: Milus Mallick, RN            06/20/2011, the patient had the following procedures             1.  ultrasound access left femoral artery       2.  abdominal aortogram       3.  bilateral lower extremity runoff       4.  second order catheterization                        Please schedule the patient to come by to see Dr. Kellie Simmering within the next one to 2 weeks. He will need lower extremity vein mapping prior to his visit

## 2011-06-20 NOTE — H&P (View-Only) (Signed)
Subjective:     Patient ID: LEA ASA, male   DOB: May 28, 1940, 71 y.o.   MRN: [id number]  HPI this 71 year old male is known to our practice from previous evaluation for claudication in the right leg. 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 He is able to walk a couple of blocks before having to stop and rest. He has no history of rest pain, nonhealing ulcers, gangrene, or infection. He also has a known right ICA occlusion but no history of CVA. His left ICA has been followed with no significant stenosis.  Past Medical History  Diagnosis Date  . Hypercholesterolemia   . Hypertension   . Prostate cancer   . Macular degeneration   . Hx of radiation therapy 03/22/11 to 05/08/11    PSA recurrent carcinoma of prostate    History  Substance Use Topics  . Smoking status: Former Smoker -- 3.0 packs/day for 20 years    Types: Cigarettes    Quit date: 01/30/2002  . Smokeless tobacco: Never Used  . Alcohol Use: No    Family History  Problem Relation Age of Onset  . Cancer Brother 64    prostate    Allergies  Allergen Reactions  . Avelox (Moxifloxacin Hcl In Nacl)     diarrhea    Current outpatient prescriptions:amLODipine (NORVASC) 10 MG tablet, Take 10 mg by mouth daily., Disp: , Rfl: ;  aspirin 81 MG tablet, Take 81 mg by mouth daily., Disp: , Rfl: ;  calcium-vitamin D 250-100 MG-UNIT per tablet, Take 1 tablet by mouth 2 (two) times daily., Disp: , Rfl: ;  fish oil-omega-3 fatty acids 1000 MG capsule, Take 2 g by mouth daily., Disp: , Rfl:  losartan-hydrochlorothiazide (HYZAAR) 100-12.5 MG per tablet, Take 1 tablet by mouth daily., Disp: , Rfl: ;  Multiple Vitamin (MULTIVITAMIN) tablet, Take 1 tablet by mouth daily., Disp: , Rfl: ;  rosuvastatin (CRESTOR) 20 MG tablet, Take 10 mg by mouth daily. , Disp: , Rfl: ;  Multiple Vitamins-Minerals (ICAPS MV PO), Take by mouth., Disp: , Rfl:   BP 131/81  Pulse 86  Resp 18  Ht 5\' 8"   (1.727 m)  Wt 171 lb (77.565 kg)  BMI 26.00 kg/m2  Body mass index is 26.00 kg/(m^2).          Review of Systems denies chest pain, dyspnea on exertion, PND, orthopnea, hemoptysis-all systems negative and complete review of systems     Objective:   Physical Exam blood pressure 131/81 heart rate 86 respirations 18 Gen.-alert and oriented x3 in no apparent distress HEENT normal for age Lungs no rhonchi or wheezing Cardiovascular regular rhythm no murmurs carotid pulses 3+ palpable no bruits audible Abdomen soft nontender no palpable masses Musculoskeletal free of  major deformities Skin clear -no rashes Neurologic normal Lower extremities 3+ femoral pulse on the left 2+ on the right-absent popliteal and distal pulses in right-1-2+ left popliteal pulse palpable Both the well-perfused  Today I ordered lower extremity arterial Dopplers which are reviewed and interpreted. ABI on the right is 0.61 similar to 2 years ago. ABI on the left is 0.80.      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 Trula Slade on Tuesday, May 21

## 2011-06-20 NOTE — Discharge Instructions (Signed)

## 2011-06-20 NOTE — Interval H&P Note (Signed)
History and Physical Interval Note:  06/20/2011 6:58 AM  Lance Dillon  has presented today for surgery, with the diagnosis of PVD  The various methods of treatment have been discussed with the patient and family. After consideration of risks, benefits and other options for treatment, the patient has consented to  Procedure(s) (LRB): ABDOMINAL AORTAGRAM (N/A) as a surgical intervention .  The patients' history has been reviewed, patient examined, no change in status, stable for surgery.  I have reviewed the patients' chart and labs.  Questions were answered to the patient's satisfaction.     Lance Dillon, V. WELLS

## 2011-06-20 NOTE — Telephone Encounter (Signed)
Left detailed message on pt home phone regarding appointment on 06/27/11 @ 330pm for vein mapping and to see JDL. Asked that pt call back to confirm appt with schedulers at [phone]

## 2011-06-21 ENCOUNTER — Other Ambulatory Visit: Payer: Self-pay | Admitting: *Deleted

## 2011-06-21 DIAGNOSIS — I70219 Atherosclerosis of native arteries of extremities with intermittent claudication, unspecified extremity: Secondary | ICD-10-CM

## 2011-06-21 DIAGNOSIS — Z01810 Encounter for preprocedural cardiovascular examination: Secondary | ICD-10-CM

## 2011-06-23 ENCOUNTER — Encounter: Payer: Self-pay | Admitting: Vascular Surgery

## 2011-06-27 ENCOUNTER — Encounter: Payer: Self-pay | Admitting: Vascular Surgery

## 2011-06-27 ENCOUNTER — Encounter (INDEPENDENT_AMBULATORY_CARE_PROVIDER_SITE_OTHER): Payer: Medicare Other | Admitting: *Deleted

## 2011-06-27 ENCOUNTER — Ambulatory Visit (INDEPENDENT_AMBULATORY_CARE_PROVIDER_SITE_OTHER): Payer: Medicare Other | Admitting: Vascular Surgery

## 2011-06-27 VITALS — BP 129/81 | HR 82 | Temp 98.1°F | Ht 68.0 in | Wt 170.0 lb

## 2011-06-27 DIAGNOSIS — I70219 Atherosclerosis of native arteries of extremities with intermittent claudication, unspecified extremity: Secondary | ICD-10-CM

## 2011-06-27 DIAGNOSIS — Z01810 Encounter for preprocedural cardiovascular examination: Secondary | ICD-10-CM

## 2011-06-27 NOTE — Progress Notes (Signed)
Subjective:     Patient ID: Lance Dillon, male   DOB: [birth year], 71 y.o.   MRN: [id number]  HPI this 71 year old male patient returns today to discuss findings of an angiogram performed by Dr. Trula Slade one week ago patient has severe claudication in the right leg limiting him to walking about one quarter of a block. At that time he has severe heavy aching throbbing discomfort in the right calf and the stopped ambulating. He does not have true rest pain. He does occasionally feel tightness in both feet. He has no history of coronary artery disease and denies any chest pain, dyspnea on exertion, PND, orthopnea, or hemoptysis. His wife raised the question of possible coronary artery disease which is undiagnosed. He has an appointment to see Dr. Rollene Fare later this week.  Past Medical History  Diagnosis Date  . Hypercholesterolemia   . Hypertension   . Prostate cancer   . Macular degeneration   . Hx of radiation therapy 03/22/11 to 05/08/11    PSA recurrent carcinoma of prostate    History  Substance Use Topics  . Smoking status: Former Smoker -- 3.0 packs/day for 20 years    Types: Cigarettes    Quit date: 01/30/2002  . Smokeless tobacco: Never Used  . Alcohol Use: No    Family History  Problem Relation Age of Onset  . Cancer Brother 57    prostate    Allergies  Allergen Reactions  . Avelox (Moxifloxacin Hcl In Nacl)     diarrhea    Current outpatient prescriptions:acetaminophen (TYLENOL) 325 MG tablet, Take 650 mg by mouth every 6 (six) hours as needed. For pain, Disp: , Rfl: ;  amLODipine (NORVASC) 10 MG tablet, Take 10 mg by mouth daily., Disp: , Rfl: ;  aspirin 81 MG tablet, Take 81 mg by mouth daily., Disp: , Rfl: ;  Calcium Carbonate-Vitamin D (CALCIUM + D PO), Take 1 tablet by mouth daily., Disp: , Rfl:  docusate sodium (COLACE) 100 MG capsule, Take 100 mg by mouth at bedtime., Disp: , Rfl: ;  fish oil-omega-3 fatty acids 1000 MG capsule, Take 3 g by mouth daily. , Disp: ,  Rfl: ;  ibuprofen (ADVIL,MOTRIN) 200 MG tablet, Take 200 mg by mouth every 6 (six) hours as needed. For pain, Disp: , Rfl: ;  losartan-hydrochlorothiazide (HYZAAR) 100-12.5 MG per tablet, Take 1 tablet by mouth daily., Disp: , Rfl:  LUTEIN PO, Take 1 tablet by mouth daily., Disp: , Rfl: ;  Multiple Vitamin (MULITIVITAMIN WITH MINERALS) TABS, Take 1 tablet by mouth daily., Disp: , Rfl: ;  polyethylene glycol (MIRALAX / GLYCOLAX) packet, Take 17 g by mouth at bedtime., Disp: , Rfl: ;  rosuvastatin (CRESTOR) 10 MG tablet, Take 10 mg by mouth daily., Disp: , Rfl:   BP 129/81  Pulse 82  Temp(Src) 98.1 F (36.7 C) (Oral)  Ht 5\' 8"  (1.727 m)  Wt 170 lb (77.111 kg)  BMI 25.85 kg/m2  SpO2 100%  Body mass index is 25.85 kg/(m^2).         Review of Systems     Objective:   Physical Exam blood pressure 129/81 heart rate 82 respirations 16 General well-developed well-nourished male no apparent distress alert and oriented x3 HEENT normal for age Lungs no rhonchi or wheezing Cardiovascular regular rhythm no murmurs carotid pulses 3+ no audible bruits Abdomen soft nontender no masses palpable Right leg with 1-2+ femoral pulse no distal pulses. Left leg with 2-3+ femoral pulse no distal pulses. No active  ulceration or gangrene is noted.  Today I ordered vein mapping of both great saphenous veins in both appear to be adequate right slightly larger than left.  I reviewed the angiogram performed Dr. 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 Rollene Fare later this week      Plan:     #1 we'll schedule for right femoral endarterectomy and femoral-popliteal bypass using saphenous vein to below knee popliteal artery on Wednesday, June 19 #2 patient will get cardiology clearance from Dr. Rollene Fare #3 risks and benefits of this procedure as well as possible graft failure were discussed with patient and his wife and they would like to proceed with cardiac status is satisfactory

## 2011-07-01 DIAGNOSIS — I739 Peripheral vascular disease, unspecified: Secondary | ICD-10-CM

## 2011-07-01 HISTORY — DX: Peripheral vascular disease, unspecified: I73.9

## 2011-07-01 HISTORY — PX: NM MYOVIEW LTD: HXRAD82

## 2011-07-04 ENCOUNTER — Other Ambulatory Visit: Payer: Self-pay

## 2011-07-04 NOTE — Procedures (Unsigned)
CAROTID DUPLEX EXAM  INDICATION:  Carotid disease/known right ICA occlusion.  HISTORY: Diabetes:  No. Cardiac:  No. Hypertension:  Yes. Smoking:  Previous. Previous Surgery:  No. CV History:  Currently asymptomatic. Amaurosis Fugax No, Paresthesias No, Hemiparesis No                                      RIGHT             LEFT Brachial systolic pressure:         132               129 Brachial Doppler waveforms:         Normal            Normal Vertebral direction of flow:                          Antegrade DUPLEX VELOCITIES (cm/sec) CCA peak systolic                                       57 ECA peak systolic                                     36 ICA peak systolic                                     20 ICA end diastolic                                     4 PLAQUE MORPHOLOGY:                                    Heterogeneous PLAQUE AMOUNT:                                        Mild PLAQUE LOCATION:                                      ECA/distal CCA  IMPRESSION: 1. Doppler velocities and waveform morphology noted in the left     internal carotid artery suggest occlusive disease distal to the     viewable area of ultrasound interrogation.  No hemodynamically     significant stenosis noted in the left carotid system. 2. No significant change noted when compared to the previous exam on     08/07/212.  ___________________________________________ Nelda Severe. Kellie Simmering, M.D.  CH/MEDQ  D:  06/16/2011  T:  06/16/2011  Job:  [id number]

## 2011-07-04 NOTE — Procedures (Unsigned)
VASCULAR LAB EXAM  INDICATION:  Vein mapping for peripheral artery disease.  HISTORY: Diabetes:  No. Cardiac:  No. Hypertension:  Yes.  EXAM:  Duplex imaging reveals the right greater saphenous to measure from 0.19 to 0.55 cm.  The left greater saphenous measures 0.27 to 0.59 cm.  IMPRESSION:  Patent greater saphenous vein bilaterally with measurements as described above.  ___________________________________________ Nelda Severe. Kellie Simmering, M.D.  SS/MEDQ  D:  06/27/2011  T:  06/27/2011  Job:  [id number]

## 2011-07-06 HISTORY — PX: CARDIOVASCULAR STRESS TEST: SHX262

## 2011-07-10 ENCOUNTER — Encounter (HOSPITAL_COMMUNITY): Payer: Self-pay | Admitting: *Deleted

## 2011-07-10 NOTE — Progress Notes (Signed)
Wife stated that depending on stress test results , will determine whether pt will have surgery for right fem-pop.

## 2011-07-12 ENCOUNTER — Encounter (HOSPITAL_COMMUNITY): Payer: Self-pay | Admitting: Respiratory Therapy

## 2011-07-13 ENCOUNTER — Encounter (HOSPITAL_COMMUNITY)
Admission: RE | Admit: 2011-07-13 | Discharge: 2011-07-13 | Disposition: A | Discharge status: Home / Self Care | Payer: Medicare Other | Source: Ambulatory Visit | Attending: Vascular Surgery | Admitting: Vascular Surgery

## 2011-07-13 ENCOUNTER — Encounter (HOSPITAL_COMMUNITY): Payer: Self-pay

## 2011-07-13 HISTORY — DX: Urinary calculus, unspecified: N20.9

## 2011-07-13 HISTORY — DX: Blindness, one eye, unspecified eye: H54.40

## 2011-07-13 HISTORY — DX: Unspecified macular degeneration: H35.30

## 2011-07-13 HISTORY — DX: Atherosclerosis of renal artery: I70.1

## 2011-07-13 HISTORY — DX: Unspecified osteoarthritis, unspecified site: M19.90

## 2011-07-13 HISTORY — DX: Peripheral vascular disease, unspecified: I73.9

## 2011-07-13 HISTORY — DX: Cardiac murmur, unspecified: R01.1

## 2011-07-13 LAB — URINALYSIS, ROUTINE W REFLEX MICROSCOPIC
Ketones, ur: NEGATIVE mg/dL
Leukocytes, UA: NEGATIVE
Nitrite: NEGATIVE
pH: 6 (ref 5.0–8.0)

## 2011-07-13 LAB — SURGICAL PCR SCREEN: MRSA, PCR: NEGATIVE

## 2011-07-13 LAB — COMPREHENSIVE METABOLIC PANEL
ALT: 20 U/L (ref 0–53)
AST: 21 U/L (ref 0–37)
Albumin: 4.1 g/dL (ref 3.5–5.2)
Alkaline Phosphatase: 52 U/L (ref 39–117)
Chloride: 100 mEq/L (ref 96–112)
Potassium: 3.6 mEq/L (ref 3.5–5.1)
Sodium: 141 mEq/L (ref 135–145)
Total Bilirubin: 1.5 mg/dL — ABNORMAL HIGH (ref 0.3–1.2)
Total Protein: 6.9 g/dL (ref 6.0–8.3)

## 2011-07-13 LAB — ABO/RH: ABO/RH(D): O POS

## 2011-07-13 LAB — CBC
MCHC: 35.3 g/dL (ref 30.0–36.0)
Platelets: 191 10*3/uL (ref 150–400)
RDW: 12.6 % (ref 11.5–15.5)
WBC: 4.2 10*3/uL (ref 4.0–10.5)

## 2011-07-13 LAB — URINE MICROSCOPIC-ADD ON

## 2011-07-13 LAB — PROTIME-INR
INR: 1.03 (ref 0.00–1.49)
Prothrombin Time: 13.7 seconds (ref 11.6–15.2)

## 2011-07-13 LAB — APTT: aPTT: 30 seconds (ref 24–37)

## 2011-07-13 NOTE — Pre-Procedure Instructions (Addendum)
Lance Dillon  07/13/2011   Your procedure is scheduled on:  07/19/11  Report to [facility] at 630 AM.  Call this number if you have problems the morning of surgery: [phone]   Remember:   Do not eat food:After Midnight.  May have clear liquids: up to 4 Hours before arrival 230 am.  Clear liquids include soda, tea, black coffee, apple or grape juice, broth.  Take these medicines the morning of surgery with A SIP OF WATER:Stop omega 3, ibuprofen now   Do not wear jewelry, make-up or nail polish.  Do not wear lotions, powders, or perfumes. You may wear deodorant.  Do not shave 48 hours prior to surgery. Men may shave face and neck.  Do not bring valuables to the hospital.  Contacts, dentures or bridgework may not be worn into surgery.  Leave suitcase in the car. After surgery it may be brought to your room.  For patients admitted to the hospital, checkout time is 11:00 AM the day of discharge.   Patients discharged the day of surgery will not be allowed to drive home.  Name and phone number of your driver:betty [phone] , [phone] wife  Special Instructions: CHG Shower Use Special Wash: 1/2 bottle night before surgery and 1/2 bottle morning of surgery.   Please read over the following fact sheets that you were given: Pain Booklet, Coughing and Deep Breathing, Blood Transfusion Information, MRSA Information and Surgical Site Infection Prevention

## 2011-07-13 NOTE — Progress Notes (Signed)
HAS CUN[place]AM CLAMP ON PENIS FOR URINE CONTROL

## 2011-07-14 NOTE — Consult Note (Signed)
Anesthesia Chart Review:  Patient is a 71 year old male scheduled for right femoral endarterectomy and right FPBG on 07/19/11.  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  He uses a Cunningham clamp on his penis for urine control.  Urologist is Dr. Alinda Money.  PCP is Dr. Thressa Sheller.       He was evaluated by Dr. Rollene Fare [facility]) for pre-operative evaluation on 06/30/11.  EKG then showed NSR, borderline LAD.  A Lexiscan Myoview was recommended and done on 07/06/11.  This showed evidence of mild ischemia in the basal inferior and mid inferior regions (the area of ischemia was felt small, 5% or less of LV myocardium), EF 64%, normal LV systolic function.  Ultimately, it was felt to be a low risk scan.  He did recommend prophylactic perioperative B-block therapy and was started on Toprol.  Echo on 02/23/10 showed mild LVH, normal LV systolic function, EF > [id number], trace MR, mild TR, mild AR, no AS.  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 No acute abnormalities.  Labs acceptable.  Plan to proceed.  Myra Gianotti, PA-C

## 2011-07-18 MED ORDER — DEXTROSE 5 % IV SOLN
1.5000 g | INTRAVENOUS | Status: AC
  Administered 2011-07-19: 1.5 g via INTRAVENOUS
  Filled 2011-07-18: qty 1.5

## 2011-07-19 ENCOUNTER — Encounter (HOSPITAL_COMMUNITY): Payer: Self-pay | Admitting: Vascular Surgery

## 2011-07-19 ENCOUNTER — Inpatient Hospital Stay (HOSPITAL_COMMUNITY): Payer: Medicare Other

## 2011-07-19 ENCOUNTER — Inpatient Hospital Stay (HOSPITAL_COMMUNITY)
Admission: RE | Admit: 2011-07-19 | Discharge: 2011-07-21 | DRG: 253 | Disposition: A | Discharge status: Home / Self Care | Payer: Medicare Other | Source: Ambulatory Visit | Attending: Vascular Surgery | Admitting: Vascular Surgery

## 2011-07-19 ENCOUNTER — Encounter (HOSPITAL_COMMUNITY)
Admission: RE | Disposition: A | Discharge status: Home / Self Care | Payer: Self-pay | Source: Ambulatory Visit | Attending: Vascular Surgery

## 2011-07-19 ENCOUNTER — Ambulatory Visit (HOSPITAL_COMMUNITY): Payer: Medicare Other | Admitting: Vascular Surgery

## 2011-07-19 DIAGNOSIS — I70219 Atherosclerosis of native arteries of extremities with intermittent claudication, unspecified extremity: Principal | ICD-10-CM

## 2011-07-19 DIAGNOSIS — D62 Acute posthemorrhagic anemia: Secondary | ICD-10-CM | POA: Diagnosis not present

## 2011-07-19 DIAGNOSIS — Z01812 Encounter for preprocedural laboratory examination: Secondary | ICD-10-CM

## 2011-07-19 DIAGNOSIS — H3530 Unspecified macular degeneration: Secondary | ICD-10-CM | POA: Diagnosis present

## 2011-07-19 DIAGNOSIS — E7800 Pure hypercholesterolemia, unspecified: Secondary | ICD-10-CM | POA: Diagnosis present

## 2011-07-19 DIAGNOSIS — Z8546 Personal history of malignant neoplasm of prostate: Secondary | ICD-10-CM

## 2011-07-19 DIAGNOSIS — I743 Embolism and thrombosis of arteries of the lower extremities: Secondary | ICD-10-CM

## 2011-07-19 DIAGNOSIS — Z87891 Personal history of nicotine dependence: Secondary | ICD-10-CM

## 2011-07-19 HISTORY — PX: PR VEIN BYPASS GRAFT,AORTO-FEM-POP: 35551

## 2011-07-19 HISTORY — PX: FEMORAL-POPLITEAL BYPASS GRAFT: SHX937

## 2011-07-19 HISTORY — PX: INTRAOPERATIVE ARTERIOGRAM: SHX5157

## 2011-07-19 LAB — CBC
Hemoglobin: 12.8 g/dL — ABNORMAL LOW (ref 13.0–17.0)
MCH: 32.8 pg (ref 26.0–34.0)
Platelets: 142 10*3/uL — ABNORMAL LOW (ref 150–400)
RBC: 3.9 MIL/uL — ABNORMAL LOW (ref 4.22–5.81)
WBC: 9.2 10*3/uL (ref 4.0–10.5)

## 2011-07-19 LAB — CREATININE, SERUM
Creatinine, Ser: 1.01 mg/dL (ref 0.50–1.35)
GFR calc Af Amer: 85 mL/min — ABNORMAL LOW (ref >90)
GFR calc non Af Amer: 73 mL/min — ABNORMAL LOW (ref >90)

## 2011-07-19 SURGERY — BYPASS GRAFT FEMORAL-POPLITEAL ARTERY
Anesthesia: General | Site: Leg Lower | Laterality: Right | Wound class: Clean

## 2011-07-19 MED ORDER — ALUM & MAG HYDROXIDE-SIMETH 200-200-20 MG/5ML PO SUSP: 15.0000 mL | ORAL | Status: DC | PRN

## 2011-07-19 MED ORDER — ASPIRIN 81 MG PO TABS: 81.0000 mg | ORAL_TABLET | Freq: Every day | ORAL | Status: DC

## 2011-07-19 MED ORDER — MIDAZOLAM HCL 5 MG/5ML IJ SOLN
INTRAMUSCULAR | Status: DC | PRN
  Administered 2011-07-19: 1 mg via INTRAVENOUS

## 2011-07-19 MED ORDER — LABETALOL HCL 5 MG/ML IV SOLN
10.0000 mg | INTRAVENOUS | Status: DC | PRN
  Filled 2011-07-19: qty 4

## 2011-07-19 MED ORDER — ROCURONIUM BROMIDE 100 MG/10ML IV SOLN
INTRAVENOUS | Status: DC | PRN
  Administered 2011-07-19 (×3): 10 mg via INTRAVENOUS
  Administered 2011-07-19: 50 mg via INTRAVENOUS

## 2011-07-19 MED ORDER — POTASSIUM CHLORIDE CRYS ER 20 MEQ PO TBCR: 20.0000 meq | EXTENDED_RELEASE_TABLET | Freq: Once | ORAL | Status: AC | PRN

## 2011-07-19 MED ORDER — NEOSTIGMINE METHYLSULFATE 1 MG/ML IJ SOLN
INTRAMUSCULAR | Status: DC | PRN
  Administered 2011-07-19: 3 mg via INTRAVENOUS

## 2011-07-19 MED ORDER — SODIUM CHLORIDE 0.9 % IV SOLN: 500.0000 mL | Freq: Once | INTRAVENOUS | Status: AC | PRN

## 2011-07-19 MED ORDER — LACTATED RINGERS IV SOLN
INTRAVENOUS | Status: DC | PRN
  Administered 2011-07-19 (×3): via INTRAVENOUS

## 2011-07-19 MED ORDER — DOPAMINE-DEXTROSE 3.2-5 MG/ML-% IV SOLN: 3.0000 ug/kg/min | INTRAVENOUS | Status: DC

## 2011-07-19 MED ORDER — DEXTROSE-NACL 5-0.9 % IV SOLN
INTRAVENOUS | Status: DC
  Administered 2011-07-19: 17:00:00 via INTRAVENOUS
  Administered 2011-07-20: 100 mL/h via INTRAVENOUS

## 2011-07-19 MED ORDER — MORPHINE SULFATE 2 MG/ML IJ SOLN: 2.0000 mg | INTRAMUSCULAR | Status: DC | PRN

## 2011-07-19 MED ORDER — ONDANSETRON HCL 4 MG/2ML IJ SOLN: 4.0000 mg | Freq: Four times a day (QID) | INTRAMUSCULAR | Status: DC | PRN

## 2011-07-19 MED ORDER — PHENYLEPHRINE HCL 10 MG/ML IJ SOLN
10.0000 mg | INTRAVENOUS | Status: DC | PRN
  Administered 2011-07-19: 50 ug/min via INTRAVENOUS

## 2011-07-19 MED ORDER — HYDROCHLOROTHIAZIDE 12.5 MG PO CAPS
12.5000 mg | ORAL_CAPSULE | Freq: Every day | ORAL | Status: DC
  Administered 2011-07-19 – 2011-07-20 (×2): 12.5 mg via ORAL
  Filled 2011-07-19 (×3): qty 1

## 2011-07-19 MED ORDER — OXYCODONE-ACETAMINOPHEN 5-325 MG PO TABS
1.0000 | ORAL_TABLET | ORAL | Status: DC | PRN
  Administered 2011-07-19: 1 via ORAL
  Administered 2011-07-20: 2 via ORAL
  Administered 2011-07-20: 1 via ORAL
  Filled 2011-07-19: qty 2
  Filled 2011-07-19 (×2): qty 1

## 2011-07-19 MED ORDER — ASPIRIN 81 MG PO CHEW
81.0000 mg | CHEWABLE_TABLET | Freq: Every day | ORAL | Status: DC
  Administered 2011-07-19 – 2011-07-21 (×3): 81 mg via ORAL
  Filled 2011-07-19 (×3): qty 1

## 2011-07-19 MED ORDER — EPHEDRINE SULFATE 50 MG/ML IJ SOLN
INTRAMUSCULAR | Status: DC | PRN
  Administered 2011-07-19 (×6): 5 mg via INTRAVENOUS

## 2011-07-19 MED ORDER — PROTAMINE SULFATE 10 MG/ML IV SOLN
INTRAVENOUS | Status: DC | PRN
  Administered 2011-07-19: 50 mg via INTRAVENOUS

## 2011-07-19 MED ORDER — PROPOFOL 10 MG/ML IV EMUL
INTRAVENOUS | Status: DC | PRN
  Administered 2011-07-19: 40 mg via INTRAVENOUS
  Administered 2011-07-19: 160 mg via INTRAVENOUS

## 2011-07-19 MED ORDER — METOPROLOL SUCCINATE ER 25 MG PO TB24
25.0000 mg | ORAL_TABLET | Freq: Every day | ORAL | Status: DC
Start: 2011-07-20 — End: 2011-07-21
  Administered 2011-07-21: 25 mg via ORAL
  Filled 2011-07-19 (×2): qty 1

## 2011-07-19 MED ORDER — LOSARTAN POTASSIUM-HCTZ 100-12.5 MG PO TABS: 1.0000 | ORAL_TABLET | Freq: Every day | ORAL | Status: DC

## 2011-07-19 MED ORDER — 0.9 % SODIUM CHLORIDE (POUR BTL) OPTIME
TOPICAL | Status: DC | PRN
  Administered 2011-07-19: 1000 mL

## 2011-07-19 MED ORDER — AMLODIPINE BESYLATE 10 MG PO TABS
10.0000 mg | ORAL_TABLET | Freq: Every day | ORAL | Status: DC
  Filled 2011-07-19 (×2): qty 1

## 2011-07-19 MED ORDER — METOPROLOL TARTRATE 1 MG/ML IV SOLN: 2.0000 mg | INTRAVENOUS | Status: DC | PRN

## 2011-07-19 MED ORDER — SODIUM CHLORIDE 0.9 % IV SOLN: INTRAVENOUS | Status: DC

## 2011-07-19 MED ORDER — DOCUSATE SODIUM 100 MG PO CAPS
100.0000 mg | ORAL_CAPSULE | Freq: Every day | ORAL | Status: DC
  Administered 2011-07-19 – 2011-07-20 (×2): 100 mg via ORAL
  Filled 2011-07-19 (×3): qty 1

## 2011-07-19 MED ORDER — PHENOL 1.4 % MT LIQD: 1.0000 | OROMUCOSAL | Status: DC | PRN

## 2011-07-19 MED ORDER — LIDOCAINE HCL (CARDIAC) 20 MG/ML IV SOLN
INTRAVENOUS | Status: DC | PRN
  Administered 2011-07-19: 80 mg via INTRAVENOUS

## 2011-07-19 MED ORDER — HYDROMORPHONE HCL PF 1 MG/ML IJ SOLN: 0.2500 mg | INTRAMUSCULAR | Status: DC | PRN

## 2011-07-19 MED ORDER — HEPARIN SODIUM (PORCINE) 1000 UNIT/ML IJ SOLN
INTRAMUSCULAR | Status: DC | PRN
  Administered 2011-07-19: 6000 [IU] via INTRAVENOUS
  Administered 2011-07-19: 2000 [IU] via INTRAVENOUS

## 2011-07-19 MED ORDER — IOHEXOL 300 MG/ML  SOLN
INTRAMUSCULAR | Status: DC | PRN
  Administered 2011-07-19: 48 mL

## 2011-07-19 MED ORDER — POLYETHYLENE GLYCOL 3350 17 G PO PACK
17.0000 g | PACK | Freq: Every day | ORAL | Status: DC
  Administered 2011-07-19 – 2011-07-20 (×2): 17 g via ORAL
  Filled 2011-07-19 (×3): qty 1

## 2011-07-19 MED ORDER — FENTANYL CITRATE 0.05 MG/ML IJ SOLN
INTRAMUSCULAR | Status: DC | PRN
  Administered 2011-07-19: 100 ug via INTRAVENOUS
  Administered 2011-07-19 (×3): 50 ug via INTRAVENOUS

## 2011-07-19 MED ORDER — SODIUM CHLORIDE 0.9 % IR SOLN
Status: DC | PRN
  Administered 2011-07-19: 09:00:00

## 2011-07-19 MED ORDER — PANTOPRAZOLE SODIUM 40 MG PO TBEC
40.0000 mg | DELAYED_RELEASE_TABLET | Freq: Every day | ORAL | Status: DC
  Administered 2011-07-19 – 2011-07-21 (×3): 40 mg via ORAL
  Filled 2011-07-19 (×3): qty 1

## 2011-07-19 MED ORDER — LOSARTAN POTASSIUM 50 MG PO TABS
100.0000 mg | ORAL_TABLET | Freq: Every day | ORAL | Status: DC
  Administered 2011-07-19: 100 mg via ORAL
  Filled 2011-07-19 (×3): qty 2

## 2011-07-19 MED ORDER — ONDANSETRON HCL 4 MG/2ML IJ SOLN
INTRAMUSCULAR | Status: DC | PRN
  Administered 2011-07-19: 4 mg via INTRAVENOUS

## 2011-07-19 MED ORDER — GLYCOPYRROLATE 0.2 MG/ML IJ SOLN
INTRAMUSCULAR | Status: DC | PRN
  Administered 2011-07-19: .6 mg via INTRAVENOUS
  Administered 2011-07-19: 0.2 mg via INTRAVENOUS

## 2011-07-19 MED ORDER — BISACODYL 10 MG RE SUPP: 10.0000 mg | Freq: Every day | RECTAL | Status: DC | PRN

## 2011-07-19 MED ORDER — ACETAMINOPHEN 325 MG PO TABS
325.0000 mg | ORAL_TABLET | ORAL | Status: DC | PRN
  Administered 2011-07-20 – 2011-07-21 (×2): 650 mg via ORAL
  Filled 2011-07-19 (×2): qty 2

## 2011-07-19 MED ORDER — DEXTROSE 5 % IV SOLN
1.5000 g | Freq: Two times a day (BID) | INTRAVENOUS | Status: AC
  Administered 2011-07-19 – 2011-07-20 (×2): 1.5 g via INTRAVENOUS
  Filled 2011-07-19 (×2): qty 1.5

## 2011-07-19 MED ORDER — ACETAMINOPHEN 650 MG RE SUPP: 325.0000 mg | RECTAL | Status: DC | PRN

## 2011-07-19 MED ORDER — GUAIFENESIN-DM 100-10 MG/5ML PO SYRP: 15.0000 mL | ORAL_SOLUTION | ORAL | Status: DC | PRN

## 2011-07-19 MED ORDER — ENOXAPARIN SODIUM 40 MG/0.4ML ~~LOC~~ SOLN
40.0000 mg | SUBCUTANEOUS | Status: DC
  Administered 2011-07-20 – 2011-07-21 (×2): 40 mg via SUBCUTANEOUS
  Filled 2011-07-19 (×3): qty 0.4

## 2011-07-19 MED ORDER — PHENYLEPHRINE HCL 10 MG/ML IJ SOLN
INTRAMUSCULAR | Status: DC | PRN
  Administered 2011-07-19: 80 ug via INTRAVENOUS
  Administered 2011-07-19: 40 ug via INTRAVENOUS

## 2011-07-19 MED ORDER — HYDRALAZINE HCL 20 MG/ML IJ SOLN
10.0000 mg | INTRAMUSCULAR | Status: DC | PRN
  Filled 2011-07-19: qty 0.5

## 2011-07-19 MED ORDER — ATORVASTATIN CALCIUM 20 MG PO TABS
20.0000 mg | ORAL_TABLET | Freq: Every day | ORAL | Status: DC
  Administered 2011-07-19: 20 mg via ORAL
  Filled 2011-07-19 (×3): qty 1

## 2011-07-19 SURGICAL SUPPLY — 64 items
BANDAGE ESMARK 6X9 LF (GAUZE/BANDAGES/DRESSINGS) IMPLANT
BNDG ESMARK 6X9 LF (GAUZE/BANDAGES/DRESSINGS)
BOOT SUTURE AID YELLOW STND (SUTURE) IMPLANT
CANISTER SUCTION 2500CC (MISCELLANEOUS) ×3 IMPLANT
CLIP TI MEDIUM 24 (CLIP) ×3 IMPLANT
CLIP TI WIDE RED SMALL 24 (CLIP) ×3 IMPLANT
CLOTH BEACON ORANGE TIMEOUT ST (SAFETY) ×3 IMPLANT
COVER SURGICAL LIGHT HANDLE (MISCELLANEOUS) ×3 IMPLANT
DECANTER SPIKE VIAL GLASS SM (MISCELLANEOUS) IMPLANT
DERMABOND ADHESIVE PROPEN (GAUZE/BANDAGES/DRESSINGS) ×2
DERMABOND ADVANCED (GAUZE/BANDAGES/DRESSINGS)
DERMABOND ADVANCED .7 DNX12 (GAUZE/BANDAGES/DRESSINGS) IMPLANT
DERMABOND ADVANCED .7 DNX6 (GAUZE/BANDAGES/DRESSINGS) ×4 IMPLANT
DRAIN SNY 10X20 3/4 PERF (WOUND CARE) IMPLANT
DRAPE WARM FLUID 44X44 (DRAPE) ×3 IMPLANT
DRAPE X-RAY CASS 24X20 (DRAPES) ×3 IMPLANT
ELECT REM PT RETURN 9FT ADLT (ELECTROSURGICAL) ×3
ELECTRODE REM PT RTRN 9FT ADLT (ELECTROSURGICAL) ×2 IMPLANT
EVACUATOR SILICONE 100CC (DRAIN) IMPLANT
GLOVE BIO SURGEON STRL SZ 6.5 (GLOVE) ×3 IMPLANT
GLOVE BIOGEL PI IND STRL 6.5 (GLOVE) ×4 IMPLANT
GLOVE BIOGEL PI IND STRL 7.0 (GLOVE) ×2 IMPLANT
GLOVE BIOGEL PI INDICATOR 6.5 (GLOVE) ×2
GLOVE BIOGEL PI INDICATOR 7.0 (GLOVE) ×1
GLOVE ECLIPSE 6.5 STRL STRAW (GLOVE) ×6 IMPLANT
GLOVE SS BIOGEL STRL SZ 7 (GLOVE) ×2 IMPLANT
GLOVE SUPERSENSE BIOGEL SZ 7 (GLOVE) ×1
GOWN STRL NON-REIN LRG LVL3 (GOWN DISPOSABLE) ×6 IMPLANT
INSERT FOGARTY SM (MISCELLANEOUS) ×3 IMPLANT
KIT BASIN OR (CUSTOM PROCEDURE TRAY) ×3 IMPLANT
KIT ROOM TURNOVER OR (KITS) ×3 IMPLANT
LOOP VESSEL MINI RED (MISCELLANEOUS) ×3 IMPLANT
NS IRRIG 1000ML POUR BTL (IV SOLUTION) ×6 IMPLANT
PACK PERIPHERAL VASCULAR (CUSTOM PROCEDURE TRAY) ×3 IMPLANT
PAD ARMBOARD 7.5X6 YLW CONV (MISCELLANEOUS) ×6 IMPLANT
PADDING CAST COTTON 6X4 STRL (CAST SUPPLIES) IMPLANT
PATCH HEMASHIELD 8X150 (Vascular Products) ×3 IMPLANT
SET COLLECT BLD 21X3/4 12 (NEEDLE) ×3 IMPLANT
SPONGE INTESTINAL PEANUT (DISPOSABLE) ×3 IMPLANT
SPONGE LAP 18X18 X RAY DECT (DISPOSABLE) ×3 IMPLANT
STOPCOCK 4 WAY LG BORE MALE ST (IV SETS) ×3 IMPLANT
SUT PROLENE 5 0 C 1 24 (SUTURE) ×3 IMPLANT
SUT PROLENE 5 0 C 1 36 (SUTURE) ×3 IMPLANT
SUT PROLENE 6 0 BV (SUTURE) IMPLANT
SUT PROLENE 6 0 C 1 24 (SUTURE) ×6 IMPLANT
SUT PROLENE 6 0 C 1 30 (SUTURE) ×3 IMPLANT
SUT PROLENE 6 0 CC (SUTURE) ×9 IMPLANT
SUT PROLENE 7 0 BV 1 (SUTURE) IMPLANT
SUT PROLENE 7 0 BV1 MDA (SUTURE) IMPLANT
SUT SILK 2 0 SH (SUTURE) ×3 IMPLANT
SUT SILK 3 0 (SUTURE)
SUT SILK 3-0 18XBRD TIE 12 (SUTURE) IMPLANT
SUT SILK 4 0 (SUTURE) ×2
SUT SILK 4-0 18XBRD TIE 12 (SUTURE) ×4 IMPLANT
SUT VIC AB 2-0 CTX 36 (SUTURE) ×6 IMPLANT
SUT VIC AB 3-0 SH 27 (SUTURE) ×4
SUT VIC AB 3-0 SH 27X BRD (SUTURE) ×8 IMPLANT
SUT VICRYL 4-0 PS2 18IN ABS (SUTURE) ×3 IMPLANT
TOWEL OR 17X24 6PK STRL BLUE (TOWEL DISPOSABLE) ×6 IMPLANT
TOWEL OR 17X26 10 PK STRL BLUE (TOWEL DISPOSABLE) ×6 IMPLANT
TRAY FOLEY CATH 14FRSI W/METER (CATHETERS) ×3 IMPLANT
TUBING EXTENTION W/L.L. (IV SETS) ×3 IMPLANT
UNDERPAD 30X30 INCONTINENT (UNDERPADS AND DIAPERS) ×3 IMPLANT
WATER STERILE IRR 1000ML POUR (IV SOLUTION) ×3 IMPLANT

## 2011-07-19 NOTE — Anesthesia Postprocedure Evaluation (Signed)
Anesthesia Post Note  Patient: Lance Dillon  Procedure(s) Performed: Procedure(s) (LRB): BYPASS GRAFT FEMORAL-POPLITEAL ARTERY (Right) ENDARTERECTOMY FEMORAL (Right) INTRA OPERATIVE ARTERIOGRAM (Right)  Anesthesia type: General  Patient location: PACU  Post pain: Pain level controlled and Adequate analgesia  Post assessment: Post-op Vital signs reviewed, Patient's Cardiovascular Status Stable, Respiratory Function Stable, Patent Airway and Pain level controlled  Last Vitals:  Filed Vitals:   07/19/11 1500  BP:   Pulse: 59  Temp:   Resp: 7    Post vital signs: Reviewed and stable  Level of consciousness: awake, alert  and oriented  Complications: No apparent anesthesia complications

## 2011-07-19 NOTE — Progress Notes (Signed)
Utilization review completed.  

## 2011-07-19 NOTE — Op Note (Signed)
OPERATIVE REPORT  Date of Surgery: 07/19/2011  Surgeon: [name], MD  Assistant: Johnette Abraham  Pre-op Diagnosis: Severe Claudication Right lower extremity secondary to near total occlusion right common femoral and profunda femoris artery and superficial femoral occlusive disease  Post-op Diagnosis: Side  Procedure: Procedure(s): #1 extensive endarterectomy of right external iliac, common femoral, profunda femoris, and origin of superficial femoral artery with Dacron patch angioplasty #2 right common femoral to popliteal (below knee) bypass using a nonreversible translocated saphenous vein graft from right leg INTRA OPERATIVE ARTERIOGRAM x2 Anesthesia: General  EBL: [id number] cc  Complications: None  Procedure Details: Patient was taken to the operating room placed in the supine position at which time satisfactory general endotracheal anesthesia was administered. The right leg was prepped with Betadine scrub and solution draped routine sterile manner. Incision was made in the right inguinal area the common superficial profunda femoris arteries were dissected free. Common femoral artery was totally occluded with calcific plaque which extended into the origin of the superficial femoral and the origin of the profunda. Plaque extended up proximally low the inguinal ligament and external iliac artery was dissected free for proximal control. Saphenous vein was then exposed from the saphenofemoral junction to the mid calf through multiple incisions along the medial aspect of the right leg. It was a reasonably good vein down to about the knee where it trifurcated. Popliteal artery was then exposed and the below-knee position where was a soft vessel. A subfascial anatomic tunnel was created and the patient was then heparinized. Femoral vessels were occluded with vascular clamp of the external iliac occluded proximally. Longitudinal opening made beginning in the superficial femoral artery extended up  through the totally occluded segment of common femoral into the external iliac artery. The plaque was severely calcified and totally occlusive in nature. Endarterectomy was performed from the proximal superficial femoral up to the distal external iliac and the profunda femoris was endarterectomized. The plaque extended about 2-3 cm down into the profunda was widely patent following endarterectomy. Following removal of all loose debris a Dacron patch was sewn into place with 5-0 Prolene. Following this the saphenous vein was used a non-reversed fashion. A longitudinal opening was made in the very distal end of the Dacron patch 15 blade extended with Potts scissors. Proximal and the vein was spatulated and anastomosed end-to-side with 6-0 Prolene. Clamps were then released there was an excellent pulse the first set of competent valves. Using the retrograde valvulotome the valves were rendered incompetent with resultant excellent flow out the distal end of the vein graft. The vein was carefully delivered through the polyp preserving its orientation popliteal artery opened with 15 blade extended with Potts scissors. It was a 3 mm to 3-1/2 mm vessel of good quality. The vein was carefully measured spatulated and anastomosed end to side with 6-0 Prolene. Vesseloops released there was excellent pulse in the vein graft and fairly good anterior tibial flow at the ankle. Intraoperative arteriogram was performed which revealed a widely patent distal anastomosis with runoff with anterior tibial and peroneal arteries. The  posterior tibial artery was occluded. Protamine was given to reverse the heparin following adequate hemostasis the wounds were all closed in layers with Vicryl in subcuticular fashion with Dermabond patient taken to recovery in stable condition   [name], MD 07/19/2011 12:45 PM

## 2011-07-19 NOTE — Transfer of Care (Signed)
Immediate Anesthesia Transfer of Care Note  Patient: Lance Dillon  Procedure(s) Performed: Procedure(s) (LRB): BYPASS GRAFT FEMORAL-POPLITEAL ARTERY (Right) ENDARTERECTOMY FEMORAL (Right) INTRA OPERATIVE ARTERIOGRAM (Right)  Patient Location: PACU  Anesthesia Type: General  Level of Consciousness: awake, alert  and oriented  Airway & Oxygen Therapy: Patient Spontanous Breathing and Patient connected to face mask oxygen  Post-op Assessment: Report given to PACU RN, Post -op Vital signs reviewed and stable and Patient moving all extremities  Post vital signs: Reviewed and stable  Complications: No apparent anesthesia complications

## 2011-07-19 NOTE — Anesthesia Procedure Notes (Signed)
Procedure Name: Intubation Date/Time: 07/19/2011 8:39 AM Performed by: Sabra Heck L Pre-anesthesia Checklist: Patient identified, Timeout performed, Emergency Drugs available, Suction available and Patient being monitored Patient Re-evaluated:Patient Re-evaluated prior to inductionOxygen Delivery Method: Circle system utilized Preoxygenation: Pre-oxygenation with 100% oxygen Intubation Type: IV induction Ventilation: Mask ventilation without difficulty and Oral airway inserted - appropriate to patient size Laryngoscope Size: Mac and 3 Grade View: Grade I Tube type: Oral Tube size: 8.0 mm Number of attempts: 1 Airway Equipment and Method: Stylet Placement Confirmation: ETT inserted through vocal cords under direct vision,  breath sounds checked- equal and bilateral and positive ETCO2 Secured at: 22 cm Tube secured with: Tape Dental Injury: Teeth and Oropharynx as per pre-operative assessment

## 2011-07-19 NOTE — Preoperative (Signed)
Beta Blockers   Reason not to administer Beta Blockers:B blocker taken 07/18/11 @ 1930

## 2011-07-19 NOTE — Anesthesia Preprocedure Evaluation (Signed)
Anesthesia Evaluation  Patient identified by MRN, date of birth, ID band Patient awake    Reviewed: Allergy & Precautions, H&P , NPO status , Patient's Chart, lab work & pertinent test results  Airway Mallampati: II  Neck ROM: full    Dental   Pulmonary former smoker         Cardiovascular hypertension, + Peripheral Vascular Disease     Neuro/Psych    GI/Hepatic   Endo/Other    Renal/GU      Musculoskeletal  (+) Arthritis -,   Abdominal   Peds  Hematology   Anesthesia Other Findings   Reproductive/Obstetrics                           Anesthesia Physical Anesthesia Plan  ASA: II  Anesthesia Plan: General   Post-op Pain Management:    Induction: Intravenous  Airway Management Planned: Oral ETT  Additional Equipment:   Intra-op Plan:   Post-operative Plan: Extubation in OR  Informed Consent: I have reviewed the patients History and Physical, chart, labs and discussed the procedure including the risks, benefits and alternatives for the proposed anesthesia with the patient or authorized representative who has indicated his/her understanding and acceptance.     Plan Discussed with: CRNA and Surgeon  Anesthesia Plan Comments:         Anesthesia Quick Evaluation

## 2011-07-19 NOTE — Interval H&P Note (Signed)
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 After consideration of risks, benefits and other options for treatment, the patient has consented to  Procedure(s) (LRB): BYPASS GRAFT FEMORAL-POPLITEAL ARTERY (Right) as a surgical intervention .  The patient's history has been reviewed, patient examined, no change in status, stable for surgery.  I have reviewed the patients' chart and labs.  Questions were answered to the patient's satisfaction.     [name]

## 2011-07-19 NOTE — H&P (View-Only) (Signed)
Subjective:     Patient ID: Lance Dillon, male   DOB: [birth year], 71 y.o.   MRN: [id number]  HPI this 71 year old male patient returns today to discuss findings of an angiogram performed by Dr. Trula Slade one week ago patient has severe claudication in the right leg limiting him to walking about one quarter of a block. At that time he has severe heavy aching throbbing discomfort in the right calf and the stopped ambulating. He does not have true rest pain. He does occasionally feel tightness in both feet. He has no history of coronary artery disease and denies any chest pain, dyspnea on exertion, PND, orthopnea, or hemoptysis. His wife raised the question of possible coronary artery disease which is undiagnosed. He has an appointment to see Dr. Rollene Fare later this week.  Past Medical History  Diagnosis Date  . Hypercholesterolemia   . Hypertension   . Prostate cancer   . Macular degeneration   . Hx of radiation therapy 03/22/11 to 05/08/11    PSA recurrent carcinoma of prostate    History  Substance Use Topics  . Smoking status: Former Smoker -- 3.0 packs/day for 20 years    Types: Cigarettes    Quit date: 01/30/2002  . Smokeless tobacco: Never Used  . Alcohol Use: No    Family History  Problem Relation Age of Onset  . Cancer Brother 28    prostate    Allergies  Allergen Reactions  . Avelox (Moxifloxacin Hcl In Nacl)     diarrhea    Current outpatient prescriptions:acetaminophen (TYLENOL) 325 MG tablet, Take 650 mg by mouth every 6 (six) hours as needed. For pain, Disp: , Rfl: ;  amLODipine (NORVASC) 10 MG tablet, Take 10 mg by mouth daily., Disp: , Rfl: ;  aspirin 81 MG tablet, Take 81 mg by mouth daily., Disp: , Rfl: ;  Calcium Carbonate-Vitamin D (CALCIUM + D PO), Take 1 tablet by mouth daily., Disp: , Rfl:  docusate sodium (COLACE) 100 MG capsule, Take 100 mg by mouth at bedtime., Disp: , Rfl: ;  fish oil-omega-3 fatty acids 1000 MG capsule, Take 3 g by mouth daily. , Disp: ,  Rfl: ;  ibuprofen (ADVIL,MOTRIN) 200 MG tablet, Take 200 mg by mouth every 6 (six) hours as needed. For pain, Disp: , Rfl: ;  losartan-hydrochlorothiazide (HYZAAR) 100-12.5 MG per tablet, Take 1 tablet by mouth daily., Disp: , Rfl:  LUTEIN PO, Take 1 tablet by mouth daily., Disp: , Rfl: ;  Multiple Vitamin (MULITIVITAMIN WITH MINERALS) TABS, Take 1 tablet by mouth daily., Disp: , Rfl: ;  polyethylene glycol (MIRALAX / GLYCOLAX) packet, Take 17 g by mouth at bedtime., Disp: , Rfl: ;  rosuvastatin (CRESTOR) 10 MG tablet, Take 10 mg by mouth daily., Disp: , Rfl:   BP 129/81  Pulse 82  Temp(Src) 98.1 F (36.7 C) (Oral)  Ht 5\' 8"  (1.727 m)  Wt 170 lb (77.111 kg)  BMI 25.85 kg/m2  SpO2 100%  Body mass index is 25.85 kg/(m^2).         Review of Systems     Objective:   Physical Exam blood pressure 129/81 heart rate 82 respirations 16 General well-developed well-nourished male no apparent distress alert and oriented x3 HEENT normal for age Lungs no rhonchi or wheezing Cardiovascular regular rhythm no murmurs carotid pulses 3+ no audible bruits Abdomen soft nontender no masses palpable Right leg with 1-2+ femoral pulse no distal pulses. Left leg with 2-3+ femoral pulse no distal pulses. No active  ulceration or gangrene is noted.  Today I ordered vein mapping of both great saphenous veins in both appear to be adequate right slightly larger than left.  I reviewed the angiogram performed Dr. 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 Rollene Fare later this week      Plan:     #1 we'll schedule for right femoral endarterectomy and femoral-popliteal bypass using saphenous vein to below knee popliteal artery on Wednesday, June 19 #2 patient will get cardiology clearance from Dr. Rollene Fare #3 risks and benefits of this procedure as well as possible graft failure were discussed with patient and his wife and they would like to proceed with cardiac status is satisfactory

## 2011-07-20 ENCOUNTER — Encounter (HOSPITAL_COMMUNITY): Payer: Self-pay | Admitting: Cardiology

## 2011-07-20 DIAGNOSIS — I739 Peripheral vascular disease, unspecified: Secondary | ICD-10-CM

## 2011-07-20 LAB — BASIC METABOLIC PANEL
Calcium: 8.2 mg/dL — ABNORMAL LOW (ref 8.4–10.5)
GFR calc Af Amer: 83 mL/min — ABNORMAL LOW (ref >90)
GFR calc non Af Amer: 72 mL/min — ABNORMAL LOW (ref >90)
Glucose, Bld: 118 mg/dL — ABNORMAL HIGH (ref 70–99)
Potassium: 3.7 mEq/L (ref 3.5–5.1)
Sodium: 135 mEq/L (ref 135–145)

## 2011-07-20 LAB — CBC
Hemoglobin: 11.2 g/dL — ABNORMAL LOW (ref 13.0–17.0)
MCH: 32.9 pg (ref 26.0–34.0)
MCHC: 35.7 g/dL (ref 30.0–36.0)
Platelets: 122 10*3/uL — ABNORMAL LOW (ref 150–400)

## 2011-07-20 MED ORDER — DIPHENHYDRAMINE HCL 25 MG PO CAPS
25.0000 mg | ORAL_CAPSULE | ORAL | Status: DC | PRN
  Administered 2011-07-20: 25 mg via ORAL
  Filled 2011-07-20: qty 1

## 2011-07-20 NOTE — Progress Notes (Signed)
Pt to [place] to 2002, VSS, has amb, voided, tol meal. Called report.

## 2011-07-20 NOTE — Evaluation (Signed)
Physical Therapy Evaluation Patient Details Name: Lance Dillon MRN: [id number] DOB: 1940/10/10 Today's Date: 07/20/2011 Time: [id number] PT Time Calculation (min): 27 min  PT Assessment / Plan / Recommendation Clinical Impression  Pt s/p R fem-pop bypass graft. Pt near baseline functional level, requiring assist for safety and supervision. Pt will benefit from skilled PT in the acute care setting in order to maximize functional mobility and return to PLOF prior to d/c    PT Assessment  Patient needs continued PT services    Follow Up Recommendations  No PT follow up;Supervision for mobility/OOB    Barriers to Discharge        lEquipment Recommendations  Rolling walker with 5" wheels    Recommendations for Other Services     Frequency Min 3X/week    Precautions / Restrictions Precautions Precautions: Fall Restrictions Weight Bearing Restrictions: Yes RLE Weight Bearing: Weight bearing as tolerated         Mobility  Bed Mobility Bed Mobility: Not assessed Transfers Transfers: Sit to Stand;Stand to Sit Sit to Stand: 4: Min guard;With upper extremity assist;From chair/3-in-1 Stand to Sit: 4: Min guard;With upper extremity assist;To chair/3-in-1 Details for Transfer Assistance: VC for hand placement and safety upon sitting. Minguard for safety Ambulation/Gait Ambulation/Gait Assistance: 4: Min guard Ambulation Distance (Feet): 100 Feet Assistive device: Rolling walker Ambulation/Gait Assistance Details: VC for proper sequencing and safety with RW. Pt able to tolerate increased weight bearing throughout ambulation Gait Pattern: Step-to pattern;Decreased step length - left;Decreased stance time - right;Decreased hip/knee flexion - right;Trunk flexed Gait velocity: ~1 ft/sec(26 ft/27 seconds)    Exercises     PT Diagnosis: Difficulty walking;Acute pain  PT Problem List: Decreased strength;Decreased activity tolerance;Decreased mobility;Decreased knowledge of use of  DME;Decreased safety awareness;Decreased knowledge of precautions;Pain PT Treatment Interventions: DME instruction;Gait training;Stair training;Functional mobility training;Therapeutic activities;Patient/family education   PT Goals Acute Rehab PT Goals PT Goal Formulation: With patient Time For Goal Achievement: 07/27/11 Potential to Achieve Goals: Good Pt will go Supine/Side to Sit: with modified independence PT Goal: Supine/Side to Sit - Progress: Goal set today Pt will go Sit to Supine/Side: with modified independence PT Goal: Sit to Supine/Side - Progress: Goal set today Pt will go Sit to Stand: with modified independence PT Goal: Sit to Stand - Progress: Goal set today Pt will go Stand to Sit: with modified independence PT Goal: Stand to Sit - Progress: Goal set today Pt will Transfer Bed to Chair/Chair to Bed: with modified independence PT Transfer Goal: Bed to Chair/Chair to Bed - Progress: Goal set today Pt will Ambulate: >150 feet;with modified independence;with least restrictive assistive device PT Goal: Ambulate - Progress: Goal set today Pt will Go Up / Down Stairs: 3-5 stairs;with modified independence;with least restrictive assistive device PT Goal: Up/Down Stairs - Progress: Goal set today  Visit Information  Last PT Received On: 07/20/11 Assistance Needed: +1 PT/OT Co-Evaluation/Treatment: Yes    Subjective Data      Prior Functioning  Home Living Lives With: Spouse Available Help at Discharge: Available 24 hours/day Type of Home: House Home Access: Stairs to enter Technical brewer of Steps: 3 Entrance Stairs-Rails: None Home Layout: One level Bathroom Shower/Tub: Product/process development scientist: Handicapped height Bathroom Accessibility: Yes How Accessible: Accessible via walker Home Adaptive Equipment: None Prior Function Level of Independence: Independent Able to Take Stairs?: Yes Driving: Yes Vocation: Full time  employment Communication Communication: No difficulties Dominant Hand: Right    Cognition  Overall Cognitive Status: Appears within functional limits for  tasks assessed/performed Arousal/Alertness: Awake/alert Orientation Level: Appears intact for tasks assessed Behavior During Session: [facility] for tasks performed    Extremity/Trunk Assessment Right Upper Extremity Assessment RUE ROM/Strength/Tone: Within functional levels RUE Coordination: WFL - gross motor Left Upper Extremity Assessment LUE ROM/Strength/Tone: Within functional levels LUE Coordination: WFL - gross motor Right Lower Extremity Assessment RLE ROM/Strength/Tone: Unable to fully assess;Due to pain (MMT 5/5, unable to test knee flexion) RLE Sensation: WFL - Light Touch Left Lower Extremity Assessment LLE ROM/Strength/Tone: Within functional levels LLE Sensation: WFL - Light Touch   Balance Balance Balance Assessed: Yes Static Standing Balance Static Standing - Balance Support: Right upper extremity supported;Left upper extremity supported;During functional activity Static Standing - Level of Assistance: 5: Stand by assistance Static Standing - Comment/# of Minutes: Pt stood at sink for ~ 5 minutes while brushing teeth/washing face with SBA only  End of Session PT - End of Session Equipment Utilized During Treatment: Gait belt Activity Tolerance: Patient tolerated treatment well Patient left: in chair;with call bell/phone within reach;with family/visitor present Nurse Communication: Mobility status   Ambrose Finland 07/20/2011, 9:35 AM  07/20/2011 Ambrose Finland DPT PAGER: [phone] OFFICE: [phone]

## 2011-07-20 NOTE — Care Management Note (Signed)
    Page 1 of 1   07/20/2011     10:59:51 AM   CARE MANAGEMENT NOTE 07/20/2011  Patient:  Lance Dillon, Lance Dillon   Account Number:  [id number]  Date Initiated:  07/19/2011  Documentation initiated by:  Marvetta Gibbons  Subjective/Objective Assessment:   Pt admitted s/p fem pop     Action/Plan:   PTA pt lived at home with spouse,   Anticipated DC Date:  07/22/2011   Anticipated DC Plan:  [place]  CM consult      Choice offered to / List presented to:             Status of service:  In process, will continue to follow Medicare Important Message given?   (If response is "NO", the following Medicare IM given date fields will be blank) Date Medicare IM given:   Date Additional Medicare IM given:    Discharge Disposition:    Per UR Regulation:  Reviewed for med. necessity/level of care/duration of stay  If discussed at [place] of Stay Meetings, dates discussed:    Comments:  PCP- Noah Delaine  07/20/11- 1000- Marvetta Gibbons RN, BSN [phone] Pt to tx to 2000 today, PT/OT ordered and pending- NCM to f/u on any recommendations  6/191/3- 1600- Marvetta Gibbons RN, BSN [phone] UR completed, pt admitted from PACU today, NCM to follow for post op progression,

## 2011-07-20 NOTE — Progress Notes (Signed)
Patient experienced some oozing (blood)  from his incision when he stood up this am.  After getting him seated and holding some gauze on it for a few minutes, the bleeding stopped.  Will continue to monitor.  [place] Cellar RN

## 2011-07-20 NOTE — Progress Notes (Addendum)
Occupational Therapy Evaluation Patient Details Name: Lance Dillon MRN: [id number] DOB: 07-23-1940 Today's Date: 07/20/2011 Time: [id number] OT Time Calculation (min): 31 min  OT Assessment / Plan / Recommendation Clinical Impression  71 y.o. pt. who had Rt. bypass graft femoral-popliteal artery, Rt. endarterectomy femoral, and Rt. Intra operative arteriogram. Pt. will benefit from OT to increase independence in ADLs and safety prior to returning home with wife. OT to follow acutely.     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 Pain reported 4/10.     ADL  Grooming: Performed;Teeth care;Wash/dry face;Min guard Where Assessed - Grooming: Supported standing Toilet Transfer: Simulated;Min Psychiatric nurse Method: Sit to Loss adjuster, chartered: Raised toilet seat with arms (or 3-in-1 over toilet) Equipment Used: Rolling walker ADL Comments: Pt. did well in session today. He was able to perform sit to stand transfer with Min G assist and ambulated to sink with RW. Pt. was able to perform grooming tasks at sink with Min G assist. Pt. reported pain 4/10.     OT Diagnosis: Acute pain  OT Problem List: Pain;Impaired balance (sitting and/or standing) OT Treatment Interventions: Self-care/ADL training;DME and/or AE instruction;Patient/family education;Balance training   OT Goals Acute Rehab OT Goals OT Goal Formulation: With patient Time For Goal Achievement: 08/03/11 Potential to Achieve Goals: Good ADL Goals Pt Will Perform  Grooming: with modified independence;Standing at sink ADL Goal: Grooming - Progress: Goal set today Pt Will Perform Upper Body Bathing: with modified independence;Sitting at sink ADL Goal: Upper Body Bathing - Progress: Goal set today Pt Will Perform Lower Body Bathing: with modified independence;Sit to stand from chair ADL Goal: Lower Body Bathing - Progress: Goal set today Pt Will Perform Upper Body Dressing: with modified independence;Sitting, chair ADL Goal: Upper Body Dressing - Progress: Goal set today Pt Will Perform Lower Body Dressing: with modified independence;Sit to stand from chair ADL Goal: Lower Body Dressing - Progress: Goal set today Pt Will Transfer to Toilet: with modified independence;Ambulation;Regular height toilet ADL Goal: Toilet Transfer - Progress: Goal set today Pt Will Perform Toileting - Clothing Manipulation: with modified independence;Standing ADL Goal: Toileting - Clothing Manipulation - Progress: Goal set today Pt Will Perform Toileting - Hygiene: with modified independence;Sit to stand from 3-in-1/toilet ADL Goal: Toileting - Hygiene - Progress: Goal set today Pt Will Perform Tub/Shower Transfer: Tub transfer;with modified independence;Ambulation ADL Goal: Tub/Shower Transfer - Progress: Goal set today  Visit Information  Last OT Received On: 07/20/11 Assistance Needed: +1 PT/OT Co-Evaluation/Treatment: Yes    Subjective Data   "Ya'll don't take me serious I like to joke a lot"   Prior Functioning  Home Living Lives With: Spouse Available Help at Discharge: Available 24 hours/day Type of Home: House Home Access: Stairs to enter [employer] of Steps: 3 Entrance Stairs-Rails: None Home Layout: One level Bathroom Shower/Tub: Product/process development scientist: Handicapped height Bathroom Accessibility: Yes How Accessible: Accessible via walker Home Adaptive Equipment: None Prior Function Level of Independence: Independent Able  to Take Stairs?: Yes Driving: Yes Vocation: Full time  employment Communication Communication: No difficulties Dominant Hand: Right    Cognition  Overall Cognitive Status: Appears within functional limits for tasks assessed/performed Arousal/Alertness: Awake/alert Orientation Level: Appears intact for tasks assessed Behavior During Session: [facility] for tasks performed    Extremity/Trunk Assessment Right Upper Extremity Assessment RUE ROM/Strength/Tone: Within functional levels RUE Coordination: WFL - gross motor Left Upper Extremity Assessment LUE ROM/Strength/Tone: Within functional levels LUE Coordination: WFL - gross motor Right Lower Extremity Assessment RLE ROM/Strength/Tone: Unable to fully assess;Due to pain (MMT 5/5, unable to test knee flexion) RLE Sensation: WFL - Light Touch Left Lower Extremity Assessment LLE ROM/Strength/Tone: Within functional levels LLE Sensation: WFL - Light Touch   Mobility Bed Mobility Bed Mobility: Not assessed Transfers Transfers: Sit to Stand;Stand to Sit Sit to Stand: 4: Min guard;With upper extremity assist;From chair/3-in-1 Stand to Sit: 4: Min guard;With upper extremity assist;To chair/3-in-1 Details for Transfer Assistance: VC for hand placement and safety upon sitting. Minguard for safety   Exercise    Balance Balance Balance Assessed: Yes Static Standing Balance Static Standing - Balance Support: Right upper extremity supported;Left upper extremity supported;During functional activity Static Standing - Level of Assistance: 5: Stand by assistance Static Standing - Comment/# of Minutes: Pt stood at sink for ~ 5 minutes while brushing teeth/washing face with SBA only  End of Session OT - End of Session Activity Tolerance: Patient tolerated treatment well Patient left: in chair;with call bell/phone within reach;with family/visitor present   Roseanne Reno 07/20/2011, 9:37 AM   [place] Melter   OTR/L Pager: [phone] Office:  [phone] .

## 2011-07-20 NOTE — Progress Notes (Signed)
VASCULAR LAB PRELIMINARY  PRELIMINARY  PRELIMINARY  PRELIMINARY  VASCULAR LAB PRELIMINARY  ARTERIAL  ABI completed:    RIGHT    LEFT    PRESSURE WAVEFORM  PRESSURE WAVEFORM  BRACHIAL 124 Triphasic BRACHIAL 119 Triphasic  DP 47 Dampened Monophasic DP 82 Dampened Monophasic         PT 58 Dampened Monophasic PT 93 Monophasic  PER 64 Dampened Monophasic       RIGHT LEFT  ABI 0.52 0.75    ABI on the right indicates a moderate to severe reduction in arterial flow. Post operative angiogram revealed an occluded PT. Value obtained may be a collateral. Left ABI indicates a mild to moderate reduction in arterial flow.  Jobin Montelongo, RVS 07/20/2011, 8:12 PM

## 2011-07-20 NOTE — Progress Notes (Addendum)
[facility] Progress Note  07/20/2011 7:35 AM POD 1  Subjective:  "Had some oozing from my lower incision this morning"  Afebrile x 24 hrs  VSS   99%2LO2NC Filed Vitals:   07/20/11 0400  BP:   Pulse:   Temp: 98.2 F (36.8 C)  Resp:     Physical Exam: Incisions:  All are c/d/i.  There was some minimal drainage from the proximal portion of the distal incision this am per report by RN.  This was not present on my exam. Extremities:  +doppler signal on the right PT/DP.  Right foot is warm and well perfused.  CBC    Component Value Date/Time   WBC 5.6 07/20/2011 0357   RBC 3.40* 07/20/2011 0357   HGB 11.2* 07/20/2011 0357   HCT 31.4* 07/20/2011 0357   PLT 122* 07/20/2011 0357   MCV 92.4 07/20/2011 0357   MCH 32.9 07/20/2011 0357   MCHC 35.7 07/20/2011 0357   RDW 12.7 07/20/2011 0357    BMET    Component Value Date/Time   NA 135 07/20/2011 0357   K 3.7 07/20/2011 0357   CL 100 07/20/2011 0357   CO2 28 07/20/2011 0357   GLUCOSE 118* 07/20/2011 0357   BUN 18 07/20/2011 0357   CREATININE 1.03 07/20/2011 0357   CALCIUM 8.2* 07/20/2011 0357   GFRNONAA 72* 07/20/2011 0357   GFRAA 83* 07/20/2011 0357    INR    Component Value Date/Time   INR 1.03 07/13/2011 0845     Intake/Output Summary (Last 24 hours) at 07/20/11 0735 Last data filed at 07/20/11 0600  Gross per 24 hour  Intake   3590 ml  Output   2650 ml  Net    940 ml     Assessment/Plan:  71 y.o. male is s/p  #1 extensive endarterectomy of right external iliac, common femoral, profunda femoris, and origin of superficial femoral artery with Dacron patch angioplasty  #2 right common femoral to popliteal (below knee) bypass using a nonreversible translocated saphenous vein graft from right leg  INTRA OPERATIVE ARTERIOGRAM x2   POD 1  -acute surgical blood loss anemia -doing well.  Will transfer to 2000 -ambulate and mobilize.  Leontine Locket, PA-C [facility] [phone] 07/20/2011 7:35 AM    Agree with above Good Doppler flow in a AT and PT Patient voiding post removal of Foley Sitting in chair Wounds look good  Plan transfer to 2000, begin ambulation, DC home in a.m.

## 2011-07-21 ENCOUNTER — Encounter (HOSPITAL_COMMUNITY): Payer: Self-pay | Admitting: Vascular Surgery

## 2011-07-21 ENCOUNTER — Telehealth: Payer: Self-pay | Admitting: Vascular Surgery

## 2011-07-21 LAB — TYPE AND SCREEN
ABO/RH(D): O POS
Unit division: 0
Unit division: 0

## 2011-07-21 MED ORDER — ATORVASTATIN CALCIUM 20 MG PO TABS: 20.0000 mg | ORAL_TABLET | Freq: Every day | ORAL | Status: DC

## 2011-07-21 MED ORDER — OXYCODONE HCL 5 MG PO TABS: 5.0000 mg | ORAL_TABLET | Freq: Four times a day (QID) | ORAL | Status: DC | PRN

## 2011-07-21 MED ORDER — OXYCODONE HCL 5 MG PO TABS: 5.0000 mg | ORAL_TABLET | Freq: Four times a day (QID) | ORAL | Status: AC | PRN

## 2011-07-21 NOTE — Telephone Encounter (Signed)
Sent letter, patient not available by phone, dpm

## 2011-07-21 NOTE — Progress Notes (Signed)
Physical Therapy Treatment Patient Details Name: Lance Dillon MRN: [id number] DOB: 1940/10/08 Today's Date: 07/21/2011 Time: [id number] PT Time Calculation (min): 17 min  PT Assessment / Plan / Recommendation Comments on Treatment Session  Pt s/p R fempop and endarterectomy progressing well with ambulation and mobility and educated for ROM RLE as well as progression of activity.     Follow Up Recommendations       Barriers to Discharge        Equipment Recommendations  Rolling walker with 5" wheels    Recommendations for Other Services    Frequency     Plan Discharge plan remains appropriate    Precautions / Restrictions Precautions Precautions: None Restrictions Weight Bearing Restrictions: Yes RLE Weight Bearing: Weight bearing as tolerated   Pertinent Vitals/Pain No pain    Mobility  Bed Mobility Bed Mobility: Not assessed Supine to Sit: 6: Modified independent (Device/Increase time);HOB flat;With rails Transfers Transfers: Sit to Stand;Stand to Sit Sit to Stand: 6: Modified independent (Device/Increase time);From chair/3-in-1;With armrests Stand to Sit: 6: Modified independent (Device/Increase time);To chair/3-in-1;With armrests Details for Transfer Assistance: min VCs for safe manipulation of RW, hand placement. Pt has tendency to keep RW far from body. Ambulation/Gait Ambulation/Gait Assistance: 5: Supervision Ambulation Distance (Feet): 350 Feet Assistive device: Rolling walker Ambulation/Gait Assistance Details: cueing to increase stride and heel strike on RLE, pt able to ambulate 15' in room without AD with minguard Gait Pattern: Step-through pattern;Decreased stance time - right Gait velocity: decreased Stairs: Yes Stairs Assistance: 4: Min assist Stairs Assistance Details (indicate cue type and reason): hand held assist with cues to sequence for safety Stair Management Technique: No rails Number of Stairs: 2     Exercises General Exercises - Lower  Extremity Long Arc Quad: AROM;Right;10 reps;Seated   PT Diagnosis:    PT Problem List:   PT Treatment Interventions:     PT Goals Acute Rehab PT Goals PT Goal: Sit to Stand - Progress: Met PT Goal: Stand to Sit - Progress: Met PT Goal: Ambulate - Progress: Progressing toward goal PT Goal: Up/Down Stairs - Progress: Progressing toward goal  Visit Information  Last PT Received On: 07/21/11 Assistance Needed: +1    Subjective Data  Subjective: I think i'm doing pretty good Patient Stated Goal: get back to work and my garden   Cognition  Overall Cognitive Status: Appears within functional limits for tasks assessed/performed Arousal/Alertness: Awake/alert Orientation Level: Appears intact for tasks assessed Behavior During Session: [facility] for tasks performed    Balance  Static Standing Balance Static Standing - Balance Support: No upper extremity supported;During functional activity Static Standing - Level of Assistance: 5: Stand by assistance  End of Session PT - End of Session Activity Tolerance: Patient tolerated treatment well Patient left: in chair;with call bell/phone within reach;with family/visitor present Nurse Communication: Mobility status    Melford Aase 07/21/2011, 11:56 AM Elwyn Reach, [place]

## 2011-07-21 NOTE — Telephone Encounter (Signed)
Message copied by Gena Fray on Fri Jul 21, 2011  3:41 PM ------      Message from: Alfonso Patten      Created: Fri Jul 21, 2011  3:28 PM      Regarding: RE: schedule       EXCELLENT!      ----- Message -----         From: Gena Fray         Sent: 07/21/2011   2:20 PM           To: Alfonso Patten, RN      Subject: RE: schedule                                             Ok, I scheduled with Rusty on 07/08- JDL is in office, but overbooked. At least JDL will be here when she sees [place]. Is that ok?      ----- Message -----         From: Alfonso Patten, RN         Sent: 07/21/2011   9:59 AM           To: Gena Fray      Subject: RE: schedule                                             Can he see it on Monday 7/8? [employer] will have to come out in 2 weeks so I guess Rusty?      ----- Message -----         From: Gena Fray         Sent: 07/21/2011   9:46 AM           To: Alfonso Patten, RN      Subject: FW: schedule                                             Lance Dillon,             JDL is already @ 28 patients on 08/08/11 and is on vacation the next week. What should I do with this 2 wk f/u?            Lance Dillon      ----- Message -----         From: Mena Goes, CMA         Sent: 07/21/2011   9:25 AM           To: [facility]      Subject: schedule                                                             ----- Message -----         From: Gabriel Earing, PA         Sent: 07/21/2011   9:12 AM  To: Mena Goes, CMA            S/p      #1 extensive endarterectomy of right external iliac, common femoral, profunda femoris, and origin of superficial femoral artery with Dacron patch angioplasty      #2 right common femoral to popliteal (below knee) bypass using a nonreversible translocated saphenous vein graft from right leg      INTRA OPERATIVE ARTERIOGRAM x2            By JDL            F/u with him in 2 weeks.            Thanks!  Aldona Bar

## 2011-07-21 NOTE — Progress Notes (Signed)
Pt discharge instructions and patient education complete. IV site d.c. Site WNL. No further questions. Pt d/c home with family. Marko Plume

## 2011-07-21 NOTE — Progress Notes (Addendum)
[facility] Progress Note  07/21/2011 8:00 AM POD 2  Subjective:  C/o nausea this am.  Also states he had a rash with raised red bumps last pm.  Was given benadryl and it is improved.  Tm 99.3 now 99.2  Otherwise VSS  91%RA Filed Vitals:   07/21/11 0503  BP: 116/75  Pulse: 86  Temp: 99.2 F (37.3 C)  Resp: 18    Physical Exam: Incisions:  All are c/d/i there is some ecchymosis around the groin incision, but no hematoma  Extremities:  right foot is warm and well perfused.  CBC    Component Value Date/Time   WBC 5.6 07/20/2011 0357   RBC 3.40* 07/20/2011 0357   HGB 11.2* 07/20/2011 0357   HCT 31.4* 07/20/2011 0357   PLT 122* 07/20/2011 0357   MCV 92.4 07/20/2011 0357   MCH 32.9 07/20/2011 0357   MCHC 35.7 07/20/2011 0357   RDW 12.7 07/20/2011 0357    BMET    Component Value Date/Time   NA 135 07/20/2011 0357   K 3.7 07/20/2011 0357   CL 100 07/20/2011 0357   CO2 28 07/20/2011 0357   GLUCOSE 118* 07/20/2011 0357   BUN 18 07/20/2011 0357   CREATININE 1.03 07/20/2011 0357   CALCIUM 8.2* 07/20/2011 0357   GFRNONAA 72* 07/20/2011 0357   GFRAA 83* 07/20/2011 0357    INR    Component Value Date/Time   INR 1.03 07/13/2011 0845     Intake/Output Summary (Last 24 hours) at 07/21/11 0800 Last data filed at 07/20/11 1700  Gross per 24 hour  Intake    720 ml  Output    225 ml  Net    495 ml    ABIs 07/20/11  RIGHT    LEFT     PRESSURE  WAVEFORM   PRESSURE  WAVEFORM   BRACHIAL  124  Triphasic  BRACHIAL  119  Triphasic   DP  47  Dampened Monophasic  DP  82  Dampened Monophasic          PT  58  Dampened Monophasic  PT  93  Monophasic   PER  64  Dampened Monophasic        RIGHT  LEFT   ABI  0.52  0.75      Assessment/Plan:  71 y.o. male is s/p:   #1 extensive endarterectomy of right external iliac, common femoral, profunda femoris, and origin of superficial femoral artery with Dacron patch angioplasty  #2 right common femoral to popliteal (below knee)  bypass using a nonreversible translocated saphenous vein graft from right leg  INTRA OPERATIVE ARTERIOGRAM x2   POD 2  -ABI on the right 0.52 and was 0.61 pre op. -rash may be due to hospital linens.  Told pt and wife he may wear his own clothes.  Continue benadryl as needed.  -pt with nausea-possibly home later today if nausea resolves. -will not give pt lipitor, which is substitute for crestor-he has had it in the past and it caused him muscle aches.  Leontine Locket, PA-C [facility] [phone] 07/21/2011 8:00 AM   Agree with above Nausea resolved.  Ambulating, tolerating PO.  No pain Stable for D/C  Wells Sharonna Vinje

## 2011-07-21 NOTE — Discharge Summary (Signed)
[facility] Discharge Summary  Lance Dillon April 15, 1940 71 y.o. 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 male patient returns today to discuss findings of an angiogram performed by Dr. Trula Slade one week ago patient has severe claudication in the right leg limiting him to walking about one quarter of a block. At that time he has severe heavy aching throbbing discomfort in the right calf and the stopped ambulating. He does not have true rest pain. He does occasionally feel tightness in both feet. He has no history of coronary artery disease and denies any chest pain, dyspnea on exertion, PND, orthopnea, or hemoptysis. His wife raised the question of possible coronary artery disease which is undiagnosed. He has an appointment to see Dr. Rollene Fare later this week.  Hospital Course:  The patient was admitted to the hospital and taken to the operating room on 07/19/2011 and underwent  #1 extensive endarterectomy of right external iliac, common femoral, profunda femoris, and origin of superficial femoral artery with Dacron patch angioplasty  #2 right common femoral to popliteal (below knee) bypass using a nonreversible translocated saphenous vein graft from right leg  INTRA OPERATIVE ARTERIOGRAM x2  The pt tolerated the procedure well and was transported to the PACU in good condition. By POD 1, he was doing well.  He did have minimal bloody drainage from his distal incision.  By POD 2, he states he had a rash overnight.  He was given benadryl and this did improve the rash and the itching.  He also had some nausea on POD 2.  This did resolve and he was discharged home.  The remainder of the hospital course consisted of increasing mobilization and increasing intake of solids without difficulty.  CBC      Component Value Date/Time   WBC 5.6 07/20/2011 0357   RBC 3.40* 07/20/2011 0357   HGB 11.2* 07/20/2011 0357   HCT 31.4* 07/20/2011 0357   PLT 122* 07/20/2011 0357   MCV 92.4 07/20/2011 0357   MCH 32.9 07/20/2011 0357   MCHC 35.7 07/20/2011 0357   RDW 12.7 07/20/2011 0357    BMET    Component Value Date/Time   NA 135 07/20/2011 0357   K 3.7 07/20/2011 0357   CL 100 07/20/2011 0357   CO2 28 07/20/2011 0357   GLUCOSE 118* 07/20/2011 0357   BUN 18 07/20/2011 0357   CREATININE 1.03 07/20/2011 0357   CALCIUM 8.2* 07/20/2011 0357   GFRNONAA 72* 07/20/2011 0357   GFRAA 83* 07/20/2011 0357     Discharge Instructions:   The patient is discharged to home with extensive instructions on wound care and progressive ambulation.  They are instructed not to drive or perform any heavy lifting until returning to see the physician in his office.  Discharge Orders    Future Orders Please Complete By Expires   Resume previous diet      Driving Restrictions      Comments:   No driving for 2 weeks   Lifting restrictions      Comments:   No lifting for 6 weeks   Call MD for:  temperature >100.5      Call MD for:  redness, tenderness, or signs of infection (pain, swelling, bleeding, redness, odor or green/yellow discharge around incision site)      Call MD for:  severe or increased pain, loss or decreased feeling  in affected limb(s)      Discharge wound care:      Comments:   Shower daily with soap and water starting 07/22/11      Discharge Diagnosis:  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 Prostate cancer  . Atherosclerosis of native arteries of the extremities with intermittent claudication   Past Medical History  Diagnosis Date  . Hypercholesterolemia   . Hypertension   . Macular degeneration   . Hx of radiation therapy 03/22/11 to 05/08/11    PSA recurrent carcinoma of prostate  . Heart murmur   . Peripheral  vascular disease   . Stones in the urinary tract   . Renal artery stenosis     partial blockage  . Prostate cancer   . Arthritis   . Macular degeneration of both eyes   . Blind right eye       Amadeo, Pihl  Home Medication Instructions [id number]   Printed on:07/21/11 0910  Medication Information                    aspirin 81 MG tablet Take 81 mg by mouth daily.           amLODipine (NORVASC) 10 MG tablet Take 10 mg by mouth daily.           losartan-hydrochlorothiazide (HYZAAR) 100-12.5 MG per tablet Take 1 tablet by mouth daily.           rosuvastatin (CRESTOR) 10 MG tablet Take 10 mg by mouth daily.           LUTEIN PO Take 1 tablet by mouth daily.           polyethylene glycol (MIRALAX / GLYCOLAX) packet Take 17 g by mouth at bedtime.           docusate sodium (COLACE) 100 MG capsule Take 100 mg by mouth at bedtime.           ibuprofen (ADVIL,MOTRIN) 200 MG tablet Take 200 mg by mouth every 6 (six) hours as needed. For pain           acetaminophen (TYLENOL) 325 MG tablet Take 650 mg by mouth every 6 (six) hours as needed. For pain           metoprolol succinate (TOPROL-XL) 25 MG 24 hr tablet Take 25 mg by mouth daily.           Omega-3 Fatty Acids (FISH OIL) 1200 MG CAPS Take 3,600 mg by mouth daily.           Calcium Carbonate-Vitamin D (CALTRATE 600+D PO) Take 1 tablet by mouth daily.           oxyCODONE (ROXICODONE) 5 MG immediate release tablet Take 1 tablet (5 mg total) by mouth every 6 (six) hours as needed for pain. #30 NR            Disposition: home  Patient's condition: is Good  Follow up: 1. Dr. Kellie Simmering in 2 weeks   Leontine Locket, PA-C [facility] [phone] 07/21/2011  9:10 AM

## 2011-07-21 NOTE — Progress Notes (Signed)
Occupational Therapy Treatment Patient Details Name: Lance Dillon MRN: [id number] DOB: Sep 18, 1940 Today's Date: 07/21/2011 Time: [id number] OT Time Calculation (min): 14 min  OT Assessment / Plan / Recommendation Comments on Treatment Session Pt progressing toward goals s/p Rt. bypass graft femoral-popliteal artery, Rt. endarterectomy femoral, and Rt. Intra operative arteriogram. Pt should likely not need f/u OT at d/c.    Follow Up Recommendations  No OT follow up    Barriers to Discharge       Equipment Recommendations  Rolling walker with 5" wheels    Recommendations for Other Services    Frequency Min 2X/week   Plan Discharge plan remains appropriate    Precautions / Restrictions Precautions Precautions: Fall Restrictions Weight Bearing Restrictions: Yes RLE Weight Bearing: Weight bearing as tolerated   Pertinent Vitals/Pain     ADL  Grooming: Performed;Wash/dry hands;Supervision/safety Where Assessed - Grooming: Supported standing Lower Body Dressing: Performed;Minimal assistance Where Assessed - Lower Body Dressing: Unsupported sitting Toilet Transfer: Chartered loss adjuster Method: Sit to Loss adjuster, chartered: Grab bars;Regular height toilet ADL Comments: Educated pt how to prop R foot up on to a chair or stool to don/doff sock and thread pant leg into LE. Pt was able to rise from std toilet using grab bar. Pt has a vanity beside toilet at home. Recommended pt obtain a tub chair or bench for home use.    OT Diagnosis:    OT Problem List:   OT Treatment Interventions:     OT Goals ADL Goals ADL Goal: Grooming - Progress: Progressing toward goals ADL Goal: Lower Body Dressing - Progress: Progressing toward goals ADL Goal: Toileting - Clothing Manipulation - Progress: Progressing toward goals  Visit Information  Last OT Received On: 07/21/11 Assistance Needed: +1    Subjective Data  Subjective: My wife helped me put my  clothes on.   Prior Functioning       Cognition  Overall Cognitive Status: Appears within functional limits for tasks assessed/performed Arousal/Alertness: Awake/alert Orientation Level: Appears intact for tasks assessed Behavior During Session: [facility] for tasks performed    Mobility Bed Mobility Bed Mobility: Supine to Sit Supine to Sit: 6: Modified independent (Device/Increase time);HOB flat;With rails Transfers Sit to Stand: 5: Supervision;With upper extremity assist;From bed;From toilet Stand to Sit: 5: Supervision;With upper extremity assist;To chair/3-in-1;To toilet Details for Transfer Assistance: min VCs for safe manipulation of RW, hand placement. Pt has tendency to keep RW far from body.   Exercises    Balance Static Standing Balance Static Standing - Balance Support: No upper extremity supported;During functional activity Static Standing - Level of Assistance: 5: Stand by assistance  End of Session OT - End of Session Activity Tolerance: Patient tolerated treatment well Patient left: in chair;with call bell/phone within reach;with family/visitor present   Nijae Doyel A OTR/L [phone] 07/21/2011, 11:25 AM

## 2011-07-21 NOTE — Consult Note (Signed)
Pt quit more than over one yea ago. Congratulated and encouraged pt to remain tobacco free.

## 2011-07-27 NOTE — Discharge Summary (Signed)
Already done

## 2011-08-04 ENCOUNTER — Encounter: Payer: Self-pay | Admitting: Neurosurgery

## 2011-08-07 ENCOUNTER — Ambulatory Visit (INDEPENDENT_AMBULATORY_CARE_PROVIDER_SITE_OTHER): Payer: Medicare Other | Admitting: Neurosurgery

## 2011-08-07 ENCOUNTER — Encounter: Payer: Self-pay | Admitting: Neurosurgery

## 2011-08-07 VITALS — BP 115/73 | HR 69 | Temp 97.1°F | Resp 16 | Ht 68.0 in | Wt 167.3 lb

## 2011-08-07 DIAGNOSIS — I739 Peripheral vascular disease, unspecified: Secondary | ICD-10-CM

## 2011-08-07 DIAGNOSIS — Z48812 Encounter for surgical aftercare following surgery on the circulatory system: Secondary | ICD-10-CM

## 2011-08-07 NOTE — Addendum Note (Signed)
Addended by: Mena Goes on: 08/07/2011 09:58 AM   Modules accepted: Orders

## 2011-08-07 NOTE — Progress Notes (Signed)
[facility] Office Note  CC: 2 weeks status post right lower extremity femoral popliteal bypass Referring Physician: Kellie Simmering  History of Present Illness: 71 year old male patient that is 2 weeks status post a right bypass graft femoral to popliteal artery with endarterectomy of the right femoral artery. The patient states that he can walk now without pain in his right lower extremity. He does have a slight bit of edema which is normal 2 weeks postop. The wound is well-healed, the patient is asking to go back to work and did speak with Dr. Kellie Simmering today. Past Medical History  Diagnosis Date  . Hypercholesterolemia   . Hypertension   . Macular degeneration   . Hx of radiation therapy 03/22/11 to 05/08/11    PSA recurrent carcinoma of prostate  . Heart murmur   . Peripheral vascular disease   . Stones in the urinary tract   . Renal artery stenosis     partial blockage  . Prostate cancer   . Arthritis   . Macular degeneration of both eyes   . Blind right eye     ROS: [x]  Positive   [ ]  Denies    General: [ ]  Weight loss, [ ]  Fever, [ ]  chills Neurologic: [ ]  Dizziness, [ ]  Blackouts, [ ]  Seizure [ ]  Stroke, [ ]  "Mini stroke", [ ]  Slurred speech, [ ]  Temporary blindness; [ ]  weakness in arms or legs, [ ]  Hoarseness Cardiac: [ ]  Chest pain/pressure, [ ]  Shortness of breath at rest [ ]  Shortness of breath with exertion, [ ]  Atrial fibrillation or irregular heartbeat Vascular: [ ]  Pain in legs with walking, [ ]  Pain in legs at rest, [ ]  Pain in legs at night,  [ ]  Non-healing ulcer, [ ]  Blood clot in vein/DVT,   Pulmonary: [ ]  Home oxygen, [ ]  Productive cough, [ ]  Coughing up blood, [ ]  Asthma,  [ ]  Wheezing Musculoskeletal:  [ ]  Arthritis, [ ]  Low back pain, [ ]  Joint pain Hematologic: [ ]  Easy Bruising, [ ]  Anemia; [ ]  Hepatitis Gastrointestinal: [ ]  Blood in stool, [ ]  Gastroesophageal Reflux/heartburn, [ ]  Trouble swallowing Urinary: [ ]  chronic  Kidney disease, [ ]  on HD - [ ]  MWF or [ ]  TTHS, [ ]  Burning with urination, [ ]  Difficulty urinating Skin: [ ]  Rashes, [ ]  Wounds Psychological: [ ]  Anxiety, [ ]  Depression   Social History History  Substance Use Topics  . Smoking status: Former Smoker -- 3.0 packs/day for 20 years    Types: Cigarettes    Quit date: 01/30/2002  . Smokeless tobacco: Never Used  . Alcohol Use: No    Family History Family History  Problem Relation Age of Onset  . Cancer Brother 44    prostate    Allergies  Allergen Reactions  . Avelox (Moxifloxacin Hcl In Nacl)     diarrhea    Current Outpatient Prescriptions  Medication Sig Dispense Refill  . acetaminophen (TYLENOL) 325 MG tablet Take 650 mg by mouth every 6 (six) hours as needed. For pain      . amLODipine (NORVASC) 10 MG tablet Take 10 mg by mouth daily.      Marland Kitchen aspirin 81 MG tablet Take 81 mg by mouth daily.      . Calcium Carbonate-Vitamin D (CALTRATE 600+D PO) Take 1 tablet by mouth daily.      Marland Kitchen docusate sodium (COLACE) 100 MG capsule Take 100 mg by mouth at bedtime.      Marland Kitchen  ibuprofen (ADVIL,MOTRIN) 200 MG tablet Take 200 mg by mouth every 6 (six) hours as needed. For pain      . losartan-hydrochlorothiazide (HYZAAR) 100-12.5 MG per tablet Take 1 tablet by mouth daily.      . LUTEIN PO Take 1 tablet by mouth daily.      . metoprolol succinate (TOPROL-XL) 25 MG 24 hr tablet Take 25 mg by mouth daily.      . Omega-3 Fatty Acids (FISH OIL) 1200 MG CAPS Take 3,600 mg by mouth daily.      . polyethylene glycol (MIRALAX / GLYCOLAX) packet Take 17 g by mouth at bedtime.      . rosuvastatin (CRESTOR) 10 MG tablet Take 10 mg by mouth daily.        Physical Examination  Filed Vitals:   08/07/11 0923  BP: 115/73  Pulse: 69  Temp:   Resp:     Body mass index is 25.44 kg/(m^2).  General:  WDWN in NAD Gait: Normal HEENT: WNL Eyes: Pupils equal Pulmonary: normal non-labored breathing , without Rales, rhonchi,  wheezing Cardiac: RRR,  without  Murmurs, rubs or gallops; No carotid bruits Abdomen: soft, NT, no masses Skin: no rashes, ulcers noted Vascular Exam/Pulses: The patient has palpable femoral pulses bilaterally and a palpable right dorsalis pedis.  Extremities without ischemic changes, no Gangrene , no cellulitis; no open wounds;  Musculoskeletal: no muscle wasting or atrophy  Neurologic: A&O X 3; Appropriate Affect ; SENSATION: normal; MOTOR FUNCTION:  moving all extremities equally. Speech is fluent/normal  Non-Invasive Vascular Imaging: None  ASSESSMENT/PLAN: Patient 2 weeks status post above procedure and doing well. The patient was told by Dr. Kellie Simmering he may return to work in the next one to 2 weeks if he still in up to it. No heavy lifting until he follows up. 3 months. The patient will followup in 3 months with repeat ABIs and a bypass graft duplex on the right lower extremity. He and his wife's questions were encouraged and answered.  Beatris Ship ANP  Clinic M.D.: Kellie Simmering

## 2011-11-13 ENCOUNTER — Encounter: Payer: Self-pay | Admitting: Vascular Surgery

## 2011-11-14 ENCOUNTER — Encounter (INDEPENDENT_AMBULATORY_CARE_PROVIDER_SITE_OTHER): Payer: Medicare Other | Admitting: *Deleted

## 2011-11-14 ENCOUNTER — Ambulatory Visit (INDEPENDENT_AMBULATORY_CARE_PROVIDER_SITE_OTHER): Payer: Medicare Other | Admitting: Vascular Surgery

## 2011-11-14 ENCOUNTER — Encounter: Payer: Self-pay | Admitting: Vascular Surgery

## 2011-11-14 VITALS — BP 143/85 | HR 65 | Ht 68.0 in | Wt 166.0 lb

## 2011-11-14 DIAGNOSIS — I70219 Atherosclerosis of native arteries of extremities with intermittent claudication, unspecified extremity: Secondary | ICD-10-CM

## 2011-11-14 DIAGNOSIS — Z48812 Encounter for surgical aftercare following surgery on the circulatory system: Secondary | ICD-10-CM

## 2011-11-14 DIAGNOSIS — I739 Peripheral vascular disease, unspecified: Secondary | ICD-10-CM

## 2011-11-14 NOTE — Progress Notes (Signed)
Subjective:     Patient ID: Lance Dillon, male   DOB: 1940-12-27, 72 y.o.   MRN: [id number]  HPI this 71 year old male returns 5 months post right femoral-popliteal bypass graft with extensive right external iliac and common femoral endarterectomy. His claudication symptoms are essentially relieved in the right leg. He is able to walk several blocks without difficulty. He has no rest pain or history of nonhealing ulcers.  Past Medical History  Diagnosis Date  . Hypercholesterolemia   . Hypertension   . Macular degeneration   . Hx of radiation therapy 03/22/11 to 05/08/11    PSA recurrent carcinoma of prostate  . Heart murmur   . Peripheral vascular disease   . Stones in the urinary tract   . Renal artery stenosis     partial blockage  . Prostate cancer   . Arthritis   . Macular degeneration of both eyes   . Blind right eye     History  Substance Use Topics  . Smoking status: Former Smoker -- 3.0 packs/day for 20 years    Types: Cigarettes    Quit date: 01/30/2002  . Smokeless tobacco: Never Used  . Alcohol Use: No    Family History  Problem Relation Age of Onset  . Cancer Brother 60    prostate    Allergies  Allergen Reactions  . Avelox (Moxifloxacin Hcl In Nacl)     diarrhea    Current outpatient prescriptions:acetaminophen (TYLENOL) 325 MG tablet, Take 650 mg by mouth every 6 (six) hours as needed. For pain, Disp: , Rfl: ;  amLODipine (NORVASC) 10 MG tablet, Take 10 mg by mouth daily., Disp: , Rfl: ;  aspirin 81 MG tablet, Take 81 mg by mouth daily., Disp: , Rfl: ;  Calcium Carbonate-Vitamin D (CALTRATE 600+D PO), Take 1 tablet by mouth daily., Disp: , Rfl:  docusate sodium (COLACE) 100 MG capsule, Take 100 mg by mouth at bedtime., Disp: , Rfl: ;  ibuprofen (ADVIL,MOTRIN) 200 MG tablet, Take 200 mg by mouth every 6 (six) hours as needed. For pain, Disp: , Rfl: ;  losartan-hydrochlorothiazide (HYZAAR) 100-12.5 MG per tablet, Take 1 tablet by mouth daily., Disp: , Rfl: ;   LUTEIN PO, Take 1 tablet by mouth daily., Disp: , Rfl:  metoprolol succinate (TOPROL-XL) 25 MG 24 hr tablet, Take 25 mg by mouth daily., Disp: , Rfl: ;  Omega-3 Fatty Acids (FISH OIL) 1200 MG CAPS, Take 3,600 mg by mouth daily., Disp: , Rfl: ;  polyethylene glycol (MIRALAX / GLYCOLAX) packet, Take 17 g by mouth at bedtime., Disp: , Rfl: ;  rosuvastatin (CRESTOR) 10 MG tablet, Take 10 mg by mouth daily., Disp: , Rfl:   BP 143/85  Pulse 65  Ht 5\' 8"  (1.727 m)  Wt 166 lb (75.297 kg)  BMI 25.24 kg/m2  SpO2 100%  Body mass index is 25.24 kg/(m^2).          Review of Systems denies chest pain, dyspnea on exertion, PND, orthopnea, hemoptysis    Objective:   Physical Exam blood pressure 143, rate 60 respirations 18 Gen.-alert and oriented x3 in no apparent distress HEENT normal for age Lungs no rhonchi or wheezing Cardiovascular regular rhythm no murmurs carotid pulses 3+ palpable no bruits audible Abdomen soft nontender no palpable masses Musculoskeletal free of  major deformities Skin clear -no rashes Neurologic normal Lower extremities 3+ femoral  pulse on the right    2+ popliteal pulse palpable right Left leg with 2+ femoral 2+ popliteal pulse  palpable. Both feet well perfused.  Today I ordered lower extremity arterial Dopplers with ABI of 0.82 on the right and 0.88 on the left. This is quite stable. Vein graft was difficult to visualize but is clearly widely patent with excellent popliteal graft pulse. Assessment:     Widely patent right femoral-popliteal graft with ABI 0.82    Plan:     Return in 3 months with repeat ABIs and duplex scan of right femoral-popliteal graft and see nurse practitioner

## 2011-11-14 NOTE — Addendum Note (Signed)
Addended by: Thresa Ross C on: 11/14/2011 02:56 PM   Modules accepted: Orders

## 2011-12-19 ENCOUNTER — Other Ambulatory Visit (HOSPITAL_COMMUNITY): Payer: Self-pay | Admitting: Internal Medicine

## 2011-12-21 ENCOUNTER — Ambulatory Visit (HOSPITAL_COMMUNITY)
Admission: RE | Admit: 2011-12-21 | Discharge: 2011-12-21 | Disposition: A | Discharge status: Home / Self Care | Payer: Medicare Other | Source: Ambulatory Visit | Attending: Internal Medicine | Admitting: Internal Medicine

## 2011-12-21 ENCOUNTER — Encounter (HOSPITAL_COMMUNITY): Payer: Self-pay

## 2011-12-21 DIAGNOSIS — M810 Age-related osteoporosis without current pathological fracture: Secondary | ICD-10-CM | POA: Insufficient documentation

## 2011-12-21 DIAGNOSIS — Z5189 Encounter for other specified aftercare: Secondary | ICD-10-CM | POA: Insufficient documentation

## 2011-12-21 MED ORDER — ZOLEDRONIC ACID 5 MG/100ML IV SOLN
5.0000 mg | Freq: Once | INTRAVENOUS | Status: AC
  Administered 2011-12-21: 5 mg via INTRAVENOUS
  Filled 2011-12-21: qty 100

## 2011-12-21 MED ORDER — SODIUM CHLORIDE 0.9 % IV SOLN
INTRAVENOUS | Status: AC
  Administered 2011-12-21: 10:00:00 via INTRAVENOUS

## 2012-02-12 ENCOUNTER — Encounter: Payer: Self-pay | Admitting: Neurosurgery

## 2012-02-13 ENCOUNTER — Encounter: Payer: Self-pay | Admitting: Neurosurgery

## 2012-02-13 ENCOUNTER — Encounter (INDEPENDENT_AMBULATORY_CARE_PROVIDER_SITE_OTHER): Payer: Medicare Other | Admitting: *Deleted

## 2012-02-13 ENCOUNTER — Ambulatory Visit (INDEPENDENT_AMBULATORY_CARE_PROVIDER_SITE_OTHER): Payer: Medicare Other | Admitting: Neurosurgery

## 2012-02-13 VITALS — BP 124/79 | HR 72 | Resp 16 | Ht 68.0 in | Wt 168.0 lb

## 2012-02-13 DIAGNOSIS — M79609 Pain in unspecified limb: Secondary | ICD-10-CM | POA: Insufficient documentation

## 2012-02-13 DIAGNOSIS — I70219 Atherosclerosis of native arteries of extremities with intermittent claudication, unspecified extremity: Secondary | ICD-10-CM

## 2012-02-13 DIAGNOSIS — Z48812 Encounter for surgical aftercare following surgery on the circulatory system: Secondary | ICD-10-CM

## 2012-02-13 NOTE — Progress Notes (Signed)
[facility] [place] PAD/PVD Office Note  CC: PAD surveillance Referring Physician: Kellie Simmering  History of Present Illness: 72 year old male patient of Dr. Kellie Simmering status post right common femoral to below the knee popliteal artery bypass graft done and June of 2013. The patient denies claudication or rest pain he states he is able to walk up to 3 miles without difficulty. The patient denies any new medical diagnoses or recent surgery.  Past Medical History  Diagnosis Date  . Hypercholesterolemia   . Hypertension   . Macular degeneration   . Hx of radiation therapy 03/22/11 to 05/08/11    PSA recurrent carcinoma of prostate  . Heart murmur   . Peripheral vascular disease   . Stones in the urinary tract   . Renal artery stenosis     partial blockage  . Prostate cancer   . Arthritis   . Macular degeneration of both eyes   . Blind right eye     ROS: [x]  Positive   [ ]  Denies    General: [ ]  Weight loss, [ ]  Fever, [ ]  chills Neurologic: [ ]  Dizziness, [ ]  Blackouts, [ ]  Seizure [ ]  Stroke, [ ]  "Mini stroke", [ ]  Slurred speech, [ ]  Temporary blindness; [ ]  weakness in arms or legs, [ ]  Hoarseness Cardiac: [ ]  Chest pain/pressure, [ ]  Shortness of breath at rest [ ]  Shortness of breath with exertion, [ ]  Atrial fibrillation or irregular heartbeat Vascular: [ ]  Pain in legs with walking, [ ]  Pain in legs at rest, [ ]  Pain in legs at night,  [ ]  Non-healing ulcer, [ ]  Blood clot in vein/DVT,   Pulmonary: [ ]  Home oxygen, [ ]  Productive cough, [ ]  Coughing up blood, [ ]  Asthma,  [ ]  Wheezing Musculoskeletal:  [ ]  Arthritis, [ ]  Low back pain, [ ]  Joint pain Hematologic: [ ]  Easy Bruising, [ ]  Anemia; [ ]  Hepatitis Gastrointestinal: [ ]  Blood in stool, [ ]  Gastroesophageal Reflux/heartburn, [ ]  Trouble swallowing Urinary: [ ]  chronic Kidney disease, [ ]  on HD - [ ]  MWF or [ ]  TTHS, [ ]  Burning with urination, [ ]  Difficulty urinating Skin: [ ]  Rashes, [ ]   Wounds Psychological: [ ]  Anxiety, [ ]  Depression   Social History History  Substance Use Topics  . Smoking status: Former Smoker -- 3.0 packs/day for 20 years    Types: Cigarettes    Quit date: 07/01/2002  . Smokeless tobacco: Never Used  . Alcohol Use: No    Family History Family History  Problem Relation Age of Onset  . Cancer Brother 89    prostate    Allergies  Allergen Reactions  . Avelox (Moxifloxacin Hcl In Nacl)     diarrhea    Current Outpatient Prescriptions  Medication Sig Dispense Refill  . acetaminophen (TYLENOL) 325 MG tablet Take 650 mg by mouth every 6 (six) hours as needed. For pain      . amLODipine (NORVASC) 10 MG tablet Take 10 mg by mouth daily.      Marland Kitchen aspirin 81 MG tablet Take 81 mg by mouth daily.      . Calcium Carbonate-Vitamin D (CALTRATE 600+D PO) Take 1 tablet by mouth daily.      Marland Kitchen docusate sodium (COLACE) 100 MG capsule Take 100 mg by mouth at bedtime.      Marland Kitchen ibuprofen (ADVIL,MOTRIN) 200 MG tablet Take 200 mg by mouth every 6 (six) hours as needed. For pain      .  losartan-hydrochlorothiazide (HYZAAR) 100-12.5 MG per tablet Take 1 tablet by mouth daily.      . LUTEIN PO Take 1 tablet by mouth daily.      . metoprolol succinate (TOPROL-XL) 25 MG 24 hr tablet Take 25 mg by mouth daily.      . Omega-3 Fatty Acids (FISH OIL) 1200 MG CAPS Take 3,600 mg by mouth daily.      . polyethylene glycol (MIRALAX / GLYCOLAX) packet Take 17 g by mouth at bedtime.      . rosuvastatin (CRESTOR) 10 MG tablet Take 10 mg by mouth daily.        Physical Examination  Filed Vitals:   02/13/12 1044  BP: 124/79  Pulse: 72  Resp: 16    Body mass index is 25.54 kg/(m^2).  General:  WDWN in NAD Gait: Normal HEENT: WNL Eyes: Pupils equal Pulmonary: normal non-labored breathing , without Rales, rhonchi,  wheezing Cardiac: RRR, without  Murmurs, rubs or gallops; No carotid bruits Abdomen: soft, NT, no masses Skin: no rashes, ulcers noted Vascular  Exam/Pulses: Lower extremity PT and DP pulses are heard with Doppler, Dr. Kellie Simmering examined the patient's popliteal pulses which are positive.  Extremities without ischemic changes, no Gangrene , no cellulitis; no open wounds;  Musculoskeletal: no muscle wasting or atrophy  Neurologic: A&O X 3; Appropriate Affect ; SENSATION: normal; MOTOR FUNCTION:  moving all extremities equally. Speech is fluent/normal  Non-Invasive Vascular Imaging: ABIs today are 0.72 and monophasic on the right, 0.7 on the left which is a decline from 0.82 on the right and 0.88 on the left in October 2013.  ASSESSMENT/PLAN: Dr. Kellie Simmering explained to the patient at the bypass graft is difficult to visualize on the duplex but he feels it is still patent since the patient is asymptomatic. The patient will followup in 3 months with repeat graft duplex and ABIs. The patient's questions were encouraged and answered, he is in agreement with this plan.  Beatris Ship ANP  Clinic M.D.: Kellie Simmering

## 2012-02-13 NOTE — Addendum Note (Signed)
Addended by: Reola Calkins on: 02/13/2012 12:38 PM   Modules accepted: Orders

## 2012-05-20 ENCOUNTER — Encounter: Payer: Self-pay | Admitting: Vascular Surgery

## 2012-05-21 ENCOUNTER — Encounter (INDEPENDENT_AMBULATORY_CARE_PROVIDER_SITE_OTHER): Payer: Medicare Other | Admitting: *Deleted

## 2012-05-21 ENCOUNTER — Encounter: Payer: Self-pay | Admitting: Vascular Surgery

## 2012-05-21 ENCOUNTER — Ambulatory Visit: Payer: Medicare Other | Admitting: Neurosurgery

## 2012-05-21 ENCOUNTER — Ambulatory Visit (INDEPENDENT_AMBULATORY_CARE_PROVIDER_SITE_OTHER): Payer: Medicare Other | Admitting: Vascular Surgery

## 2012-05-21 VITALS — BP 143/80 | HR 68 | Resp 18 | Ht 68.0 in | Wt 165.0 lb

## 2012-05-21 DIAGNOSIS — I739 Peripheral vascular disease, unspecified: Secondary | ICD-10-CM

## 2012-05-21 DIAGNOSIS — Z48812 Encounter for surgical aftercare following surgery on the circulatory system: Secondary | ICD-10-CM

## 2012-05-21 DIAGNOSIS — M79609 Pain in unspecified limb: Secondary | ICD-10-CM

## 2012-05-21 DIAGNOSIS — I70219 Atherosclerosis of native arteries of extremities with intermittent claudication, unspecified extremity: Secondary | ICD-10-CM

## 2012-05-21 NOTE — Progress Notes (Signed)
Subjective:     Patient ID: Lance Dillon, male   DOB: 1941/01/01, 72 y.o.   MRN: [id number]  HPI this 72 year old male returns for continued followup regarding his right femoral popliteal bypass graft with right external iliac and common femoral and arterectomy performed June 20 13th. He has had no claudication symptoms. He is able to ambulate 3 miles without difficulty. Most recent study in January revealed some difficulty duplex in the bypass graft. He has had no change in his symptoms however.  Past Medical History  Diagnosis Date  . Hypercholesterolemia   . Hypertension   . Macular degeneration   . Hx of radiation therapy 03/22/11 to 05/08/11    PSA recurrent carcinoma of prostate  . Heart murmur   . Peripheral vascular disease   . Stones in the urinary tract   . Renal artery stenosis     partial blockage  . Prostate cancer   . Arthritis   . Macular degeneration of both eyes   . Blind right eye     History  Substance Use Topics  . Smoking status: Former Smoker -- 3.00 packs/day for 20 years    Types: Cigarettes    Quit date: 07/01/2002  . Smokeless tobacco: Never Used  . Alcohol Use: No    Family History  Problem Relation Age of Onset  . Cancer Brother 36    prostate    Allergies  Allergen Reactions  . Avelox (Moxifloxacin Hcl In Nacl)     diarrhea    Current outpatient prescriptions:acetaminophen (TYLENOL) 325 MG tablet, Take 650 mg by mouth every 6 (six) hours as needed. For pain, Disp: , Rfl: ;  amLODipine (NORVASC) 10 MG tablet, Take 10 mg by mouth daily., Disp: , Rfl: ;  aspirin 81 MG tablet, Take 81 mg by mouth daily., Disp: , Rfl: ;  Calcium Carbonate-Vitamin D (CALTRATE 600+D PO), Take 1 tablet by mouth daily., Disp: , Rfl:  docusate sodium (COLACE) 100 MG capsule, Take 100 mg by mouth at bedtime., Disp: , Rfl: ;  ibuprofen (ADVIL,MOTRIN) 200 MG tablet, Take 200 mg by mouth every 6 (six) hours as needed. For pain, Disp: , Rfl: ;  losartan-hydrochlorothiazide  (HYZAAR) 100-12.5 MG per tablet, Take 1 tablet by mouth daily., Disp: , Rfl: ;  LUTEIN PO, Take 1 tablet by mouth daily., Disp: , Rfl:  metoprolol succinate (TOPROL-XL) 25 MG 24 hr tablet, Take 25 mg by mouth daily., Disp: , Rfl: ;  Omega-3 Fatty Acids (FISH OIL) 1200 MG CAPS, Take 3,600 mg by mouth daily., Disp: , Rfl: ;  polyethylene glycol (MIRALAX / GLYCOLAX) packet, Take 17 g by mouth at bedtime., Disp: , Rfl: ;  rosuvastatin (CRESTOR) 10 MG tablet, Take 10 mg by mouth daily., Disp: , Rfl:   BP 143/80  Pulse 68  Resp 18  Ht 5\' 8"  (1.727 m)  Wt 165 lb (74.844 kg)  BMI 25.09 kg/m2  Body mass index is 25.09 kg/(m^2).           Review of Systems denies chest pain, dyspnea on exertion, PND, orthopnea, lateralizing weakness, aphasia, amaurosis fugax, syncope.    Objective:   Physical Exam blood pressure 143/80 heart rate 68 respirations 18 General well-developed well-nourished male in no apparent stress alert and oriented x3 Lungs no rhonchi or wheezing Abdomen soft nontender no masses palpable Right lower extremity with 3+ femoral 2+ popliteal pulse palpable. Left leg 3+ femoral pulse palpable. The feet are well-perfused.  Today I ordered ABIs and duplex  scan of right lower extremity. ABI is unchanged from last visit 0.74 on the right and 0.75 on the left. Once again the technologist had difficulty imaging the bypass graft which clearly is patent since the patient's symptoms are dramatically improved and there is a palpable popliteal pulse.    Assessment:     Nicely functioning right femoral popliteal vein graft with external iliac and common femoral and arterectomy performed June 20 13th Difficulty duplex scanning right femoral popliteal vein graft-stable ABI 0.74    Plan:     Return in 3 months for duplex scan of right leg and recheck ABIs

## 2012-07-01 ENCOUNTER — Ambulatory Visit (HOSPITAL_COMMUNITY)
Admission: RE | Admit: 2012-07-01 | Discharge: 2012-07-01 | Disposition: A | Discharge status: Home / Self Care | Payer: Medicare Other | Source: Ambulatory Visit | Attending: Cardiovascular Disease | Admitting: Cardiovascular Disease

## 2012-07-01 ENCOUNTER — Other Ambulatory Visit (HOSPITAL_COMMUNITY): Payer: Self-pay | Admitting: Cardiovascular Disease

## 2012-07-01 DIAGNOSIS — I701 Atherosclerosis of renal artery: Secondary | ICD-10-CM | POA: Insufficient documentation

## 2012-07-01 HISTORY — PX: OTHER SURGICAL HISTORY: SHX169

## 2012-07-01 NOTE — Progress Notes (Signed)
Renal Duplex Completed. LRA Stenosis Oda Cogan, RDMS, RVT

## 2012-09-09 ENCOUNTER — Encounter: Payer: Self-pay | Admitting: Vascular Surgery

## 2012-09-10 ENCOUNTER — Ambulatory Visit (INDEPENDENT_AMBULATORY_CARE_PROVIDER_SITE_OTHER): Payer: Medicare Other | Admitting: Vascular Surgery

## 2012-09-10 ENCOUNTER — Encounter (INDEPENDENT_AMBULATORY_CARE_PROVIDER_SITE_OTHER): Payer: Medicare Other | Admitting: *Deleted

## 2012-09-10 ENCOUNTER — Encounter: Payer: Self-pay | Admitting: Vascular Surgery

## 2012-09-10 VITALS — BP 147/86 | HR 60 | Resp 18 | Ht 68.0 in | Wt 163.0 lb

## 2012-09-10 DIAGNOSIS — Z48812 Encounter for surgical aftercare following surgery on the circulatory system: Secondary | ICD-10-CM

## 2012-09-10 DIAGNOSIS — I739 Peripheral vascular disease, unspecified: Secondary | ICD-10-CM

## 2012-09-10 DIAGNOSIS — I70219 Atherosclerosis of native arteries of extremities with intermittent claudication, unspecified extremity: Secondary | ICD-10-CM

## 2012-09-10 NOTE — Progress Notes (Signed)
Subjective:     Patient ID: Lance Dillon, male   DOB: Jun 12, 1940, 72 y.o.   MRN: [id number]  HPI this 72 year old male returns for continued followup regarding his lower extremity occlusive disease. He had a right femoral popliteal bypass graft and extensive right external iliac and femoral and arterectomy performed by me in 2013. He denies any claudication symptoms. He is able to walk up to 3 miles at times without symptoms. He has no history of rest pain or nonhealing ulcers.  Past Medical History  Diagnosis Date  . Hypercholesterolemia   . Hypertension   . Macular degeneration   . Hx of radiation therapy 03/22/11 to 05/08/11    PSA recurrent carcinoma of prostate  . Heart murmur   . Peripheral vascular disease   . Stones in the urinary tract   . Renal artery stenosis     partial blockage  . Prostate cancer   . Arthritis   . Macular degeneration of both eyes   . Blind right eye     History  Substance Use Topics  . Smoking status: Former Smoker -- 3.00 packs/day for 20 years    Types: Cigarettes    Quit date: 07/01/2002  . Smokeless tobacco: Never Used  . Alcohol Use: No    Family History  Problem Relation Age of Onset  . Cancer Brother 39    prostate    Allergies  Allergen Reactions  . Avelox (Moxifloxacin Hcl In Nacl)     diarrhea    Current outpatient prescriptions:acetaminophen (TYLENOL) 325 MG tablet, Take 650 mg by mouth every 6 (six) hours as needed. For pain, Disp: , Rfl: ;  amLODipine (NORVASC) 10 MG tablet, Take 10 mg by mouth daily., Disp: , Rfl: ;  aspirin 81 MG tablet, Take 81 mg by mouth daily., Disp: , Rfl: ;  Calcium Carbonate-Vitamin D (CALTRATE 600+D PO), Take 1 tablet by mouth daily., Disp: , Rfl:  docusate sodium (COLACE) 100 MG capsule, Take 100 mg by mouth at bedtime., Disp: , Rfl: ;  ibuprofen (ADVIL,MOTRIN) 200 MG tablet, Take 200 mg by mouth every 6 (six) hours as needed. For pain, Disp: , Rfl: ;  losartan-hydrochlorothiazide (HYZAAR) 100-12.5 MG  per tablet, Take 1 tablet by mouth daily., Disp: , Rfl: ;  LUTEIN PO, Take 1 tablet by mouth daily., Disp: , Rfl:  metoprolol succinate (TOPROL-XL) 25 MG 24 hr tablet, Take 25 mg by mouth daily., Disp: , Rfl: ;  Omega-3 Fatty Acids (FISH OIL) 1200 MG CAPS, Take 3,600 mg by mouth daily., Disp: , Rfl: ;  polyethylene glycol (MIRALAX / GLYCOLAX) packet, Take 17 g by mouth at bedtime., Disp: , Rfl: ;  rosuvastatin (CRESTOR) 10 MG tablet, Take 10 mg by mouth daily., Disp: , Rfl:   BP 147/86  Pulse 60  Resp 18  Ht 5\' 8"  (1.727 m)  Wt 163 lb (73.936 kg)  BMI 24.79 kg/m2  Body mass index is 24.79 kg/(m^2).          Review of Systems denies chest pain, dyspnea on exertion, PND, orthopnea, hemoptysis. Does have bilateral decreased vision due to macular degeneration. Occasionally complains of hip discomfort. Other systems negative and complete review of systems.     Objective:   Physical Exam BP 147/86  Pulse 60  Resp 18  Ht 5\' 8"  (1.727 m)  Wt 163 lb (73.936 kg)  BMI 24.79 kg/m2  Gen.-alert and oriented x3 in no apparent distress HEENT normal for age Lungs no rhonchi or wheezing  Cardiovascular regular rhythm no murmurs carotid pulses 3+ palpable no bruits audible Abdomen soft nontender no palpable masses Musculoskeletal free of  major deformities Skin clear -no rashes Neurologic normal Lower extremities 3+ femoral and 2+ popliteal pulse palpable on the right no pedal pulses palpable 2+ femoral and 2+ popliteal pulse palpable on the left with no pedal pulses palpable. Both feet well perfused  Today I ordered bilateral lower extremity ABIs in duplex scan of his right lower extremity bypass which are reviewed and interpreted. ABI on the right is 0.71 on the left is 0.74 both unchanged from previous exam. It is difficult to visualize the bypass graft and total occlusion cannot be ruled out although very doubtful because patient's symptoms have been completely relieved.        Assessment:     Stable lower extremity occlusive disease with probable patent right femoral-popliteal vein graft-difficult to visualize on duplex scanning but stable ABI-asymptomatic    Plan:     Return in one year with ABIs in duplex scan of right lower extremity unless symptoms worsen in the interim

## 2012-09-11 NOTE — Addendum Note (Signed)
Addended by: Mena Goes on: 09/11/2012 08:42 AM   Modules accepted: Orders

## 2012-09-18 ENCOUNTER — Encounter: Payer: Self-pay | Admitting: Internal Medicine

## 2012-10-11 ENCOUNTER — Encounter: Payer: Self-pay | Admitting: *Deleted

## 2012-10-21 ENCOUNTER — Ambulatory Visit: Payer: Medicare Other | Admitting: Internal Medicine

## 2012-10-23 ENCOUNTER — Encounter: Payer: Self-pay | Admitting: Internal Medicine

## 2012-10-23 ENCOUNTER — Ambulatory Visit (INDEPENDENT_AMBULATORY_CARE_PROVIDER_SITE_OTHER): Payer: Medicare Other | Admitting: Internal Medicine

## 2012-10-23 VITALS — BP 126/74 | HR 68 | Ht 68.0 in | Wt 162.8 lb

## 2012-10-23 DIAGNOSIS — Z86010 Personal history of colonic polyps: Secondary | ICD-10-CM

## 2012-10-23 MED ORDER — NA SULFATE-K SULFATE-MG SULF 17.5-3.13-1.6 GM/177ML PO SOLN: ORAL | Status: DC

## 2012-10-23 NOTE — Progress Notes (Signed)
Subjective:  Referred by: Thressa Sheller, MD   Patient ID: Lance Dillon, male    DOB: 26-May-1940, 72 y.o.   MRN: [id number]  HPI The patient is a very nice elderly white man with a history of previous colonoscopy and polyp removal by Dr. Lajoyce Corners. In 2004 he had a small cecal serrated polyp call hyperplastic polyp removed. In 2007 he had a diminutive adenoma removed in the sigmoid colon. He had a colonoscopy in 2009 which was negative. He did not have any problems with his bowels at this time. No bleeding.  Allergies  Allergen Reactions  . Avelox [Moxifloxacin Hcl In Nacl]     diarrhea   Outpatient Prescriptions Prior to Visit  Medication Sig Dispense Refill  . acetaminophen (TYLENOL) 325 MG tablet Take 650 mg by mouth every 6 (six) hours as needed. For pain      . amLODipine (NORVASC) 10 MG tablet Take 10 mg by mouth daily.      Marland Kitchen aspirin 81 MG tablet Take 81 mg by mouth daily.      . Calcium Carbonate-Vitamin D (CALTRATE 600+D PO) Take 1 tablet by mouth daily.      Marland Kitchen docusate sodium (COLACE) 100 MG capsule Take 100 mg by mouth at bedtime.      Marland Kitchen ibuprofen (ADVIL,MOTRIN) 200 MG tablet Take 200 mg by mouth every 6 (six) hours as needed. For pain      . losartan-hydrochlorothiazide (HYZAAR) 100-12.5 MG per tablet Take 1 tablet by mouth daily.      . LUTEIN PO Take 1 tablet by mouth daily.      . metoprolol succinate (TOPROL-XL) 25 MG 24 hr tablet Take 25 mg by mouth daily.      . Omega-3 Fatty Acids (FISH OIL) 1200 MG CAPS Take 3,600 mg by mouth daily.      . polyethylene glycol (MIRALAX / GLYCOLAX) packet Take 17 g by mouth at bedtime.      . rosuvastatin (CRESTOR) 10 MG tablet Take 10 mg by mouth daily.       No facility-administered medications prior to visit.   Past Medical History  Diagnosis Date  . Hypercholesterolemia   . Hypertension   . Macular degeneration   . Hx of radiation therapy 03/22/11 to 05/08/11    PSA recurrent carcinoma of prostate  . Heart murmur   . Peripheral  vascular disease   . Stones in the urinary tract   . Renal artery stenosis     partial blockage  . Prostate cancer   . Arthritis   . Macular degeneration of both eyes   . Blind right eye   . Adenomatous polyp 2007    Dr. Lajoyce Corners   Past Surgical History  Procedure Laterality Date  . Prostatectomy retropubic radical  07/15/2002    with nerve sparing, Gleason 3+4=7  . Penile prosthesis implant    . Penile prosthesis  removal    . Prostate biopsy    . Hernia repair      right side,lft  . Femoral-popliteal bypass graft  07/19/2011    Procedure: BYPASS GRAFT FEMORAL-POPLITEAL ARTERY;  Surgeon: Mal Misty, MD;  Location: [facility] OR;  Service: Vascular;  Laterality: Right;  Right Femoral - Popliteal  Bypss with saphenous vein  . Intraoperative arteriogram  07/19/2011    Procedure: INTRA OPERATIVE ARTERIOGRAM;  Surgeon: Mal Misty, MD;  Location: [facility] OR;  Service: Vascular;  Laterality: Right;  . Pr vein bypass graft,aorto-fem-pop  07/19/11    Right  Fem/Pop BPG  . Colonoscopy w/ biopsies      multiple    History   Social History  . Marital Status: Married    Spouse Name: N/A    Number of Children: N/A  . Years of Education: N/A   Occupational History  . Boiler Tech    Social History Main Topics  . Smoking status: Former Smoker -- 3.00 packs/day for 20 years    Types: Cigarettes    Quit date: 07/01/2002  . Smokeless tobacco: Never Used  . Alcohol Use: No  . Drug Use: No  . Sexual Activity: None   Other Topics Concern  . None   Social History Narrative   Married, 2 children , Estate manager/land agent sales/service   Family History  Problem Relation Age of Onset  . Prostate cancer Brother 57  . Colon cancer Neg Hx   . Aneurysm Father    Review of Systems As above. He has some decreased vision with macular degeneration. 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    Objective:   Physical Exam General:  NAD Eyes:   anicteric Lungs:  clear Heart:  S1S2  no rubs, murmurs or gallops Abdomen:  soft and nontender, BS+  Data Reviewed:  Prior colonoscopies and pathology reports     Assessment & Plan:   1. Personal history of colonic polyps    His last colonoscopy in 2009 was negative. He said one diminutive adenoma in 2007, and a diminutive hyperplastic cecal polyp. I think even a gray zone, it could be that he could go longer, however that hyperplastic cecal polyp could have been a serrated adenoma which has malignant potential, he would benefit had 2 precancerous polyps in his past. So, I would proceed with a colonoscopy now 5 years after the last. If this and is free of lesions I would not recommend routine repeat colonoscopy in the future.  The risks and benefits as well as alternatives of endoscopic procedure(s) have been discussed and reviewed. All questions answered. The patient agrees to proceed.  CC: Thressa Sheller, MD I appreciate the opportunity to care for this patient.

## 2012-10-23 NOTE — Patient Instructions (Addendum)
You have been scheduled for a colonoscopy with propofol. Please follow written instructions given to you at your visit today.  Please use the prep kit you have been given today. If you use inhalers (even only as needed), please bring them with you on the day of your procedure. Your physician has requested that you go to www.startemmi.com and enter the access code given to you at your visit today. This web site gives a general overview about your procedure. However, you should still follow specific instructions given to you by our office regarding your preparation for the procedure.  I appreciate the opportunity to care for you.  

## 2012-10-24 ENCOUNTER — Encounter: Payer: Self-pay | Admitting: Internal Medicine

## 2012-10-28 ENCOUNTER — Encounter: Payer: Self-pay | Admitting: Internal Medicine

## 2012-11-05 ENCOUNTER — Ambulatory Visit (AMBULATORY_SURGERY_CENTER): Payer: Medicare Other | Admitting: Internal Medicine

## 2012-11-05 ENCOUNTER — Encounter: Payer: Self-pay | Admitting: Internal Medicine

## 2012-11-05 VITALS — BP 134/85 | HR 69 | Temp 96.5°F | Resp 28 | Ht 68.0 in | Wt 162.0 lb

## 2012-11-05 DIAGNOSIS — D126 Benign neoplasm of colon, unspecified: Secondary | ICD-10-CM

## 2012-11-05 DIAGNOSIS — Z86010 Personal history of colonic polyps: Secondary | ICD-10-CM

## 2012-11-05 MED ORDER — SODIUM CHLORIDE 0.9 % IV SOLN: 500.0000 mL | INTRAVENOUS | Status: DC

## 2012-11-05 NOTE — Op Note (Signed)
[place]  [address] [place], [address]   COLONOSCOPY PROCEDURE REPORT  PATIENT: Lance Dillon, Lance Dillon.  MR#: [id number] BIRTHDATE: 1940-11-24 , 71  yrs. old GENDER: Male ENDOSCOPIST: [name], MD, [facility] REFERRED AT:[id number] Noah Delaine, M.D. PROCEDURE DATE:  11/05/2012 PROCEDURE:   Colonoscopy with biopsy and snare polypectomy First Screening Colonoscopy - Avg.  risk and is 50 yrs.  old or older - No.  Prior Negative Screening - Now for repeat screening. N/A  History of Adenoma - Now for follow-up colonoscopy & has been > or = to 3 yrs.  Yes hx of adenoma.  Has been 3 or more years since last colonoscopy.  Polyps Removed Today? Yes. ASA CLASS:   Class III INDICATIONS:Patient's personal history of adenomatous colon polyps.  MEDICATIONS: propofol (Diprivan) 300mg  IV, MAC sedation, administered by CRNA, and These medications were titrated to patient response per physician's verbal order  DESCRIPTION OF PROCEDURE:   After the risks benefits and alternatives of the procedure were thoroughly explained, informed consent was obtained.  A digital rectal exam revealed a surgically absent prostate.   The [id number]  endoscope was introduced through the anus and advanced to the cecum, which was identified by both the appendix and ileocecal valve. No adverse events experienced.   The quality of the prep was Suprep good  The instrument was then slowly withdrawn as the colon was fully examined.   COLON FINDINGS: Three sessile polyps measuring 2-5 mm in size were found.  A polypectomy was performed with a cold snare (ascending colon) and with cold forceps (cecum and ascending colon).  The resection was complete and the polyp tissue was completely retrieved.   The colon mucosa was otherwise normal.   A right colon retroflexion was performed.  Retroflexed views revealed no abnormalities. The time to cecum=3 minutes 35 seconds.  Withdrawal time=15 minutes 16 seconds.   The scope was withdrawn and the procedure completed. COMPLICATIONS: There were no complications.  ENDOSCOPIC IMPRESSION: 1.   Three sessile polyps measuring 2-5 mm in size were found; polypectomy was performed with a cold snare and with cold forceps 2.   The colon mucosa was otherwise normal - good prep - prior hx polyps  RECOMMENDATIONS: Timing of repeat colonoscopy will be determined by pathology findings.   eSigned:  [name], MD, [facility] 11/05/2012 10:09 AM  cc: Thressa Sheller, MD and The Patient

## 2012-11-05 NOTE — Patient Instructions (Addendum)
I found and removed 3 small polyps. Everything else looked ok and the prep was good.  I will let you know pathology results and when to have another routine colonoscopy by mail.  I appreciate the opportunity to care for you. [name], MD, FACG  YOU HAD AN ENDOSCOPIC PROCEDURE TODAY AT [facility]: Refer to the procedure report that was given to you for any specific questions about what was found during the examination.  If the procedure report does not answer your questions, please call your gastroenterologist to clarify.  If you requested that your care partner not be given the details of your procedure findings, then the procedure report has been included in a sealed envelope for you to review at your convenience later.  YOU SHOULD EXPECT: Some feelings of bloating in the abdomen. Passage of more gas than usual.  Walking can help get rid of the air that was put into your GI tract during the procedure and reduce the bloating. If you had a lower endoscopy (such as a colonoscopy or flexible sigmoidoscopy) you may notice spotting of blood in your stool or on the toilet paper. If you underwent a bowel prep for your procedure, then you may not have a normal bowel movement for a few days.  DIET: Your first meal following the procedure should be a light meal and then it is ok to progress to your normal diet.  A half-sandwich or bowl of soup is an example of a good first meal.  Heavy or fried foods are harder to digest and may make you feel nauseous or bloated.  Likewise meals heavy in dairy and vegetables can cause extra gas to form and this can also increase the bloating.  Drink plenty of fluids but you should avoid alcoholic beverages for 24 hours.  ACTIVITY: Your care partner should take you home directly after the procedure.  You should plan to take it easy, moving slowly for the rest of the day.  You can resume normal activity the day after the procedure however you should NOT  DRIVE or use heavy machinery for 24 hours (because of the sedation medicines used during the test).    SYMPTOMS TO REPORT IMMEDIATELY: A gastroenterologist can be reached at any hour.  During normal business hours, 8:30 AM to 5:00 PM Monday through Friday, call [phone].  After hours and on weekends, please call the GI answering service at [phone]  Emergency number who will take a message and have the physician on call contact you.   Following lower endoscopy (colonoscopy or flexible sigmoidoscopy):  Excessive amounts of blood in the stool  Significant tenderness or worsening of abdominal pains  Swelling of the abdomen that is new, acute  Fever of 100F or higher  FOLLOW UP: If any biopsies were taken you will be contacted by phone or by letter within the next 1-3 weeks.  Call your gastroenterologist if you have not heard about the biopsies in 3 weeks.  Our staff will call the home number listed on your records the next business day following your procedure to check on you and address any questions or concerns that you may have at that time regarding the information given to you following your procedure. This is a courtesy call and so if there is no answer at the home number and we have not heard from you through the emergency physician on call, we will assume that you have returned to your regular daily activities without  incident.  SIGNATURES/CONFIDENTIALITY: You and/or your care partner have signed paperwork which will be entered into your electronic medical record.  These signatures attest to the fact that that the information above on your After Visit Summary has been reviewed and is understood.  Full responsibility of the confidentiality of this discharge information lies with you and/or your care-partner.   Handout on polyps

## 2012-11-05 NOTE — Progress Notes (Signed)
Patient did not experience any of the following events: a burn prior to discharge; a fall within the facility; wrong site/side/patient/procedure/implant event; or a hospital transfer or hospital admission upon discharge from the facility. (G8907)Patient did not have preoperative order for IV antibiotic SSI prophylaxis. (G8918) ewm 

## 2012-11-05 NOTE — Progress Notes (Signed)
Called to room to assist during endoscopic procedure.  Patient ID and intended procedure confirmed with present staff. Received instructions for my participation in the procedure from the performing physician.  

## 2012-11-06 ENCOUNTER — Telehealth: Payer: Self-pay | Admitting: *Deleted

## 2012-11-06 NOTE — Telephone Encounter (Signed)
  Follow up Call-  Call back number 11/05/2012  Post procedure Call Back phone  # [phone] or [phone]  Permission to leave phone message Yes     Patient questions:  Do you have a fever, pain , or abdominal swelling? no Pain Score  0 *  Have you tolerated food without any problems? yes  Have you been able to return to your normal activities? yes  Do you have any questions about your discharge instructions: Diet   no Medications  no Follow up visit  no  Do you have questions or concerns about your Care? no  Actions: * If pain score is 4 or above: No action needed, pain <4.

## 2012-11-13 ENCOUNTER — Encounter: Payer: Self-pay | Admitting: Internal Medicine

## 2012-11-15 ENCOUNTER — Encounter: Payer: Self-pay | Admitting: Internal Medicine

## 2012-11-15 DIAGNOSIS — Z860100 Personal history of colon polyps, unspecified: Secondary | ICD-10-CM | POA: Insufficient documentation

## 2012-11-15 NOTE — Progress Notes (Signed)
Quick Note:  1-2 diminutive adenomas repeat colonoscopy 2019 ______

## 2012-12-05 ENCOUNTER — Other Ambulatory Visit: Payer: Self-pay

## 2012-12-06 ENCOUNTER — Encounter: Payer: Self-pay | Admitting: Cardiology

## 2012-12-06 ENCOUNTER — Ambulatory Visit (INDEPENDENT_AMBULATORY_CARE_PROVIDER_SITE_OTHER): Payer: Medicare Other | Admitting: Cardiology

## 2012-12-06 VITALS — BP 132/78 | HR 68 | Ht 68.0 in | Wt 167.1 lb

## 2012-12-06 DIAGNOSIS — I10 Essential (primary) hypertension: Secondary | ICD-10-CM

## 2012-12-06 DIAGNOSIS — I70219 Atherosclerosis of native arteries of extremities with intermittent claudication, unspecified extremity: Secondary | ICD-10-CM

## 2012-12-06 DIAGNOSIS — I739 Peripheral vascular disease, unspecified: Secondary | ICD-10-CM

## 2012-12-06 DIAGNOSIS — I701 Atherosclerosis of renal artery: Secondary | ICD-10-CM

## 2012-12-06 DIAGNOSIS — E785 Hyperlipidemia, unspecified: Secondary | ICD-10-CM

## 2012-12-06 NOTE — Progress Notes (Signed)
PATIENT: Lance Dillon MRN: [id number]  DOB: 1940-06-08   DOV:12/08/2012 PCP: Thressa Sheller, MD  Clinic Note: Chief Complaint  Patient presents with  . ROV 11 months RAW-DH    No complaints.   HPI: Lance Dillon is a 72 y.o. male with a PMH below who presents today for 9 month followup. He is a former patient Dr. Rollene Fare. He was last seen in January of this year. He has known PAD (with left renal artery stenosis of 70%, status post right femoropopliteal bypass graft in June of 2013) as well as a strong family history of CAD. To date, his cardiac evaluation has been benign. He was actually seen by Dr. Rollene Fare for the first time as part of preoperative evaluation for his vascular surgery.  Interval History: He returns today continuing to be free of any significant cardiac symptoms. He denies any chest tightness or pressure at rest or exertion. No dyspnea at rest or exertion. He does note some mild claudication symptoms in his right leg where he says "it tightens up "with walking. He does say that if he keeps on walking to it it seemed to get better.  The remainder of Cardiovascular ROS: no chest pain or dyspnea on exertion negative for - edema, irregular heartbeat, loss of consciousness, murmur, orthopnea, palpitations, paroxysmal nocturnal dyspnea, rapid heart rate or shortness of breath: Additional cardiac review of systems: Lightheadedness - no, dizziness - no, syncope/near-syncope - no; TIA/amaurosis fugax - no Melena - no, hematochezia no; hematuria - no; nosebleeds - no; claudication - yes  Past Medical History  Diagnosis Date  . Hypercholesterolemia   . Hypertension   . Macular degeneration   . Hx of radiation therapy 03/22/11 to 05/08/11    PSA recurrent carcinoma of prostate  . Heart murmur   . Peripheral vascular disease   . Stones in the urinary tract   . 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 Prostate cancer   . Arthritis   . Macular degeneration of both eyes   .  Blind right eye   . Colon polyps 2004, 07, 14  . Cataract     Prior Cardiac Evaluation and Past Surgical History: Past Surgical History  Procedure Laterality Date  . Prostatectomy retropubic radical  07/15/2002    with nerve sparing, Gleason 3+4=7  . Penile prosthesis implant    . Penile prosthesis  removal    . Prostate biopsy    . Hernia repair      right side,lft  . Femoral-popliteal bypass graft  07/19/2011    Procedure: BYPASS GRAFT FEMORAL-POPLITEAL ARTERY;  Surgeon: Mal Misty, MD;  Location: [facility] OR;  Service: Vascular;  Laterality: Right;  Right Femoral - Popliteal  Bypss with saphenous vein  . Intraoperative arteriogram  07/19/2011    Procedure: INTRA OPERATIVE ARTERIOGRAM;  Surgeon: Mal Misty, MD;  Location: [facility];  Service: Vascular;  Laterality: Right;  . Pr vein bypass graft,aorto-fem-pop  07/19/11    Right  Fem/Pop BPG  . Colonoscopy w/ biopsies      multiple   . Transthoracic echocardiogram  January    Normal EF and overall function  . Nm myoview ltd  June 2013    Normal EF, very small area of basal-mid inferior ischemia; LOW RISK    Allergies  Allergen Reactions  . Avelox [Moxifloxacin Hcl In Nacl]     diarrhea    Current Outpatient Prescriptions  Medication Sig Dispense Refill  . acetaminophen (TYLENOL) 325  MG tablet Take 650 mg by mouth every 6 (six) hours as needed. For pain      . amLODipine (NORVASC) 10 MG tablet Take 10 mg by mouth daily.      Marland Kitchen aspirin 81 MG tablet Take 81 mg by mouth daily.      . Calcium Carbonate-Vitamin D (CALTRATE 600+D PO) Take 1 tablet by mouth daily.      Marland Kitchen docusate sodium (COLACE) 100 MG capsule Take 100 mg by mouth at bedtime.      . fluticasone (FLONASE) 50 MCG/ACT nasal spray Place 2 sprays into both nostrils as needed.       Marland Kitchen ibuprofen (ADVIL,MOTRIN) 200 MG tablet Take 200 mg by mouth every 6 (six) hours as needed. For pain      . losartan-hydrochlorothiazide (HYZAAR) 100-12.5 MG per tablet Take 1 tablet by mouth  daily.      . LUTEIN PO Take 1 tablet by mouth daily.      . metoprolol succinate (TOPROL-XL) 25 MG 24 hr tablet Take 25 mg by mouth daily.      . Omega-3 Fatty Acids (FISH OIL) 1200 MG CAPS Take 3,600 mg by mouth daily.      . polyethylene glycol (MIRALAX / GLYCOLAX) packet Take 17 g by mouth at bedtime.      . rosuvastatin (CRESTOR) 10 MG tablet Take 10 mg by mouth daily.       No current facility-administered medications for this visit.    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   Usually walks 2 miles maybe 2-3 days a week. Just not a cold weather.   He is a former smoker, quit "cold [place] "after 3 pack per day for 60 pack years in 2004   ROS: A comprehensive Review of Systems - Negative except Mild claudication symptoms the notes above.  PHYSICAL EXAM BP 132/78  Pulse 68  Ht 5\' 8"  (1.727 m)  Wt 167 lb 1.6 oz (75.796 kg)  BMI 25.41 kg/m2 General appearance: alert, cooperative, appears stated age, no distress and Pleasant mood and affect. Answers questions appropriately. Well-nourished well-groomed. Neck: no adenopathy, no carotid bruit, no JVD and supple, symmetrical, trachea midline Lungs: clear to auscultation bilaterally, normal percussion bilaterally and Nonlabored, good air movement Heart: regular rate and rhythm, S1, S2 normal and Possible soft S4, 2/6 systolic murmur at LUSB Abdomen: soft, non-tender; bowel sounds normal; no masses,  no organomegaly Extremities: extremities normal, atraumatic, no cyanosis or edema Pulses: Weak/faint DP and PT of the left - trace to 1+. Also faint DP and PT on the right. No femoral bruit. Neurologic: Grossly normal HEENT: [place]/AT, EOMI, MMM, anicteric sclera  [id number] today: Yes Rate:68 , Rhythm: NSR-Sinus Arrhythmia; Otherwise normal EKG.  Recent Labs: Not currently - due to be checked by PCP soon.  ASSESSMENT /  PLAN: Left renal artery stenosis - 70% by angiography; increased velocity on recent Dopplers (60-99%) With increased Doppler velocities on his most recent followup duplex in June, recommendation we did recheck in 6 months. Continue to abnormal renal function and no significant hypertension. Our review the renal Doppler studies with Dr. Gwenlyn Found to determine when and if he would recommend relook renal angiography and possible PTA stenting.  Peripheral vascular disease, unspecified Lower extremity disease, followed by Dr. Kellie Simmering.  Atherosclerosis of native arteries of the extremities with intermittent claudication With his history of peripheral vascular disease, continue  to monitor risk factors such as hypertension, and hyperlipidemia. Thankfully, so far his cardiac evaluations have been negative with a normal nuclear stress test in June of 2013 normal echo in January 2013. For cardiac risk, he remains on aspirin, and statin as well as beta blocker and ARB.  Essential hypertension Very well-controlled with current management.    Orders Placed This Encounter  Procedures  . EKG 12-Lead  . Renal Artery Duplex Bilateral    Has known left renal stenosis    Standing Status: Future     Number of Occurrences:      Standing Expiration Date: 12/06/2013    Order Specific Question:  Laterality    Answer:  Bilateral    Order Specific Question:  Where should this test be performed:    Answer:  [facility]   No orders of the defined types were placed in this encounter.    Followup: One year  DAVID W. Ellyn Hack, M.D., M.S. [facility] [address] [place], [place]  [address]  [phone] Pager # [phone]

## 2012-12-06 NOTE — Patient Instructions (Signed)
Your physician has requested that you have a renal artery duplex. During this test, an ultrasound is used to evaluate blood flow to the kidneys. Allow one hour for this exam. Do not eat after midnight the day before and avoid carbonated beverages. Take your medications as you usually do.  Your physician wants you to follow-up in one year with Dr Ellyn Hack. You will receive a reminder letter in the mail two months in advance. If you don't receive a letter, please call our office to schedule the follow-up appointment.

## 2012-12-08 ENCOUNTER — Encounter: Payer: Self-pay | Admitting: Cardiology

## 2012-12-08 DIAGNOSIS — E785 Hyperlipidemia, unspecified: Secondary | ICD-10-CM | POA: Insufficient documentation

## 2012-12-08 DIAGNOSIS — I701 Atherosclerosis of renal artery: Secondary | ICD-10-CM | POA: Insufficient documentation

## 2012-12-08 NOTE — Assessment & Plan Note (Signed)
Lower extremity disease, followed by Dr. Kellie Simmering.

## 2012-12-08 NOTE — Assessment & Plan Note (Signed)
With increased Doppler velocities on his most recent followup duplex in June, recommendation we did recheck in 6 months. Continue to abnormal renal function and no significant hypertension. Our review the renal Doppler studies with Dr. Gwenlyn Found to determine when and if he would recommend relook renal angiography and possible PTA stenting.

## 2012-12-08 NOTE — Assessment & Plan Note (Addendum)
With his history of peripheral vascular disease, continue to monitor risk factors such as hypertension, and hyperlipidemia. Thankfully, so far his cardiac evaluations have been negative with a normal nuclear stress test in June of 2013 normal echo in January 2013. For cardiac risk, he remains on aspirin, and statin as well as beta blocker and ARB.

## 2012-12-08 NOTE — Assessment & Plan Note (Signed)
Very well-controlled with current management.

## 2012-12-18 ENCOUNTER — Ambulatory Visit (HOSPITAL_COMMUNITY)
Admission: RE | Admit: 2012-12-18 | Discharge: 2012-12-18 | Disposition: A | Discharge status: Home / Self Care | Payer: Medicare Other | Source: Ambulatory Visit | Attending: Cardiovascular Disease | Admitting: Cardiovascular Disease

## 2012-12-18 DIAGNOSIS — I701 Atherosclerosis of renal artery: Secondary | ICD-10-CM | POA: Insufficient documentation

## 2012-12-18 DIAGNOSIS — I739 Peripheral vascular disease, unspecified: Secondary | ICD-10-CM

## 2012-12-18 NOTE — Progress Notes (Signed)
Renal Artery Duplex Completed. °Brianna L Mazza,RVT °

## 2012-12-23 ENCOUNTER — Telehealth: Payer: Self-pay | Admitting: *Deleted

## 2012-12-23 ENCOUNTER — Encounter: Payer: Self-pay | Admitting: *Deleted

## 2012-12-23 DIAGNOSIS — I701 Atherosclerosis of renal artery: Secondary | ICD-10-CM

## 2012-12-23 NOTE — Telephone Encounter (Signed)
Order placed for repeat renal dopplers

## 2012-12-23 NOTE — Telephone Encounter (Signed)
Message copied by Chauncy Lean on Mon Dec 23, 2012  3:00 PM ------      Message from: Leonie Man      Created: Fri Dec 20, 2012  4:54 PM       There continues to be mild progression of L Renal A stenosis. Not yet at a stage to consider intervention.            Plan is routine f/u in 6 months            HARDING,DAVID W, MD       ------

## 2012-12-25 ENCOUNTER — Encounter: Payer: Self-pay | Admitting: *Deleted

## 2012-12-31 ENCOUNTER — Telehealth: Payer: Self-pay | Admitting: Cardiology

## 2012-12-31 NOTE — Telephone Encounter (Signed)
Returned call and pt verified x 2 w/ wife, Inez Catalina.  Informed pt was sent results in MyChart and test showed blockage has progressed and needs to repeat in 6 mos.  Advised she/pt log-in to see the full message.  Verbalized understanding.

## 2012-12-31 NOTE — Telephone Encounter (Signed)
Calling to get results of doppler from nov 19.  Please call

## 2013-03-25 ENCOUNTER — Other Ambulatory Visit: Payer: Self-pay | Admitting: *Deleted

## 2013-03-25 MED ORDER — METOPROLOL SUCCINATE ER 25 MG PO TB24: 25.0000 mg | ORAL_TABLET | Freq: Every day | ORAL | Status: DC

## 2013-06-02 ENCOUNTER — Telehealth (HOSPITAL_COMMUNITY): Payer: Self-pay | Admitting: *Deleted

## 2013-06-09 ENCOUNTER — Ambulatory Visit (HOSPITAL_COMMUNITY)
Admission: RE | Admit: 2013-06-09 | Discharge: 2013-06-09 | Disposition: A | Discharge status: Home / Self Care | Payer: Medicare Other | Source: Ambulatory Visit | Attending: Cardiology | Admitting: Cardiology

## 2013-06-09 DIAGNOSIS — I701 Atherosclerosis of renal artery: Secondary | ICD-10-CM | POA: Insufficient documentation

## 2013-06-09 DIAGNOSIS — I10 Essential (primary) hypertension: Secondary | ICD-10-CM

## 2013-06-09 NOTE — Progress Notes (Signed)
Renal Duplex Completed. Danielle Lento, BS, RDMS, RVT  

## 2013-06-13 ENCOUNTER — Telehealth: Payer: Self-pay | Admitting: *Deleted

## 2013-06-13 NOTE — Telephone Encounter (Signed)
Message copied by Raiford Simmonds on Fri Jun 13, 2013  9:55 AM ------      Message from: Ellyn Hack, DAVID W      Created: Wed Jun 11, 2013  9:54 PM       Stable renal artery doppler findings.      R renal A - ~1-59%      L renal A - 60-99%; upper range      Kidneys appear normal. --            No notable change.            BP has been relatively stable -- if BP starts to climb - would consider referring to Dr. Gwenlyn Found for L Renal A Stent.            Leonie Man, MD       ------

## 2013-06-13 NOTE — Telephone Encounter (Signed)
Spoke to wife. Renal Result given. Verbalized understanding Per [facility] schedule appt- will recheck in 6 months wife aware.

## 2013-07-01 ENCOUNTER — Telehealth: Payer: Self-pay | Admitting: Cardiology

## 2013-07-01 MED ORDER — METOPROLOL SUCCINATE ER 25 MG PO TB24: 25.0000 mg | ORAL_TABLET | Freq: Every day | ORAL | Status: DC

## 2013-07-01 NOTE — Telephone Encounter (Signed)
Pt need you to fax his prescription for Metoprolol 25 mg #90 to [facility] please.Pt is changing pharmacy.

## 2013-07-01 NOTE — Telephone Encounter (Signed)
Spoke with patient wife and let her know that the Rx refill was sent into the Mail order pharmacy. Patient wife voiced  Understanding.

## 2013-07-09 ENCOUNTER — Telehealth: Payer: Self-pay | Admitting: Cardiology

## 2013-07-09 ENCOUNTER — Encounter: Payer: Self-pay | Admitting: *Deleted

## 2013-07-09 NOTE — Telephone Encounter (Signed)
Do you have the blue medicare PA for this pt's metoprolol

## 2013-07-09 NOTE — Telephone Encounter (Signed)
Did we receive the prior authorization from [facility] for this patient's Metoprolol Succ. 25 mg?

## 2013-07-10 NOTE — Telephone Encounter (Signed)
Spoke to rep. TED -AT [employer]   WILL BE SENDING PRIOR AUTHORIZATION ON METOPROLOL SUCC. REFILL. AWAITING FOR FORM - NEW FAX # --[phone]

## 2013-07-10 NOTE — Telephone Encounter (Signed)
When you call ask for Part D nurse. This is the this attempt io get clinica infol for the Metoprolol.

## 2013-07-11 NOTE — Telephone Encounter (Signed)
FORWARD TO [name]

## 2013-07-14 NOTE — Telephone Encounter (Signed)
Please ask for a Part D Nurse. Wants to know if he have tried anything else for his blood pressure except the Metoprolol, They need an answer before 9 tomorrow morning,if not  It ill be denied.

## 2013-07-16 ENCOUNTER — Telehealth: Payer: Self-pay | Admitting: Cardiology

## 2013-07-16 NOTE — Telephone Encounter (Signed)
She just wanted you to know that his Metoprolol have been approved for a year.She said you will also receive an apptoval letter in the mail.

## 2013-07-16 NOTE — Telephone Encounter (Signed)
Noted and message sent to [facility].

## 2013-07-17 NOTE — Telephone Encounter (Signed)
Medication was approved for a 1 year

## 2013-07-25 ENCOUNTER — Other Ambulatory Visit (HOSPITAL_COMMUNITY): Payer: Self-pay | Admitting: Internal Medicine

## 2013-07-25 ENCOUNTER — Ambulatory Visit (HOSPITAL_COMMUNITY)
Admission: RE | Admit: 2013-07-25 | Discharge: 2013-07-25 | Disposition: A | Discharge status: Home / Self Care | Payer: Medicare Other | Source: Ambulatory Visit | Attending: Internal Medicine | Admitting: Internal Medicine

## 2013-07-25 ENCOUNTER — Encounter (HOSPITAL_COMMUNITY): Payer: Self-pay

## 2013-07-25 DIAGNOSIS — M810 Age-related osteoporosis without current pathological fracture: Secondary | ICD-10-CM | POA: Insufficient documentation

## 2013-07-25 HISTORY — PX: OTHER SURGICAL HISTORY: SHX169

## 2013-07-25 MED ORDER — ZOLEDRONIC ACID 5 MG/100ML IV SOLN
5.0000 mg | Freq: Once | INTRAVENOUS | Status: AC
  Administered 2013-07-25: 5 mg via INTRAVENOUS
  Filled 2013-07-25: qty 100

## 2013-07-25 MED ORDER — SODIUM CHLORIDE 0.9 % IV SOLN
Freq: Once | INTRAVENOUS | Status: AC
  Administered 2013-07-25: 11:00:00 via INTRAVENOUS

## 2013-07-25 NOTE — Discharge Instructions (Signed)

## 2013-09-17 ENCOUNTER — Ambulatory Visit: Payer: Medicare Other | Admitting: Family

## 2013-09-17 ENCOUNTER — Ambulatory Visit (HOSPITAL_COMMUNITY)
Admission: RE | Admit: 2013-09-17 | Discharge: 2013-09-17 | Disposition: A | Discharge status: Home / Self Care | Payer: Medicare Other | Source: Ambulatory Visit | Attending: Vascular Surgery | Admitting: Vascular Surgery

## 2013-09-17 ENCOUNTER — Ambulatory Visit (INDEPENDENT_AMBULATORY_CARE_PROVIDER_SITE_OTHER)
Admission: RE | Admit: 2013-09-17 | Discharge: 2013-09-17 | Disposition: A | Discharge status: Home / Self Care | Payer: Medicare Other | Source: Ambulatory Visit | Attending: Vascular Surgery | Admitting: Vascular Surgery

## 2013-09-17 DIAGNOSIS — I739 Peripheral vascular disease, unspecified: Secondary | ICD-10-CM

## 2013-09-17 DIAGNOSIS — Z48812 Encounter for surgical aftercare following surgery on the circulatory system: Secondary | ICD-10-CM

## 2013-09-24 ENCOUNTER — Encounter: Payer: Self-pay | Admitting: Family

## 2013-09-25 ENCOUNTER — Encounter: Payer: Self-pay | Admitting: Family

## 2013-09-25 ENCOUNTER — Ambulatory Visit (INDEPENDENT_AMBULATORY_CARE_PROVIDER_SITE_OTHER): Payer: Medicare Other | Admitting: Family

## 2013-09-25 ENCOUNTER — Other Ambulatory Visit: Payer: Self-pay | Admitting: *Deleted

## 2013-09-25 VITALS — BP 116/71 | HR 74 | Resp 16 | Ht 68.0 in | Wt 161.0 lb

## 2013-09-25 DIAGNOSIS — I739 Peripheral vascular disease, unspecified: Secondary | ICD-10-CM

## 2013-09-25 DIAGNOSIS — Z48812 Encounter for surgical aftercare following surgery on the circulatory system: Secondary | ICD-10-CM

## 2013-09-25 NOTE — Progress Notes (Signed)
[facility] [place] HISTORY AND PHYSICAL -PAD  History of Present Illness Lance Dillon is a 73 y.o. male patient of Dr. Kellie Simmering who is s/p right common femoral to below knee popliteal artery bypass graft 07/19/2011 with right external iliac and common femoral artery endarterectomies. He returns today for follow up. He no longer has the claudication symptoms that he had before revascularization. After walking about 1/4 mile his right hip starts to hurt, relieved with rest, actually improves with more walking, no claudication symptoms in legs. He walks a couple of miles daily. He has had injections in both hips which helped his pain. Pt denies non healing wounds. Pt denies any history of stroke or TIA. His cardiologist, Dr. Ellyn Hack, checks his renal artery stenosis.  The patient denies New Medical or Surgical History.  Pt Diabetic: No Pt smoker: former smoker, quit in 2004  Pt meds include: Statin :Yes Betablocker: Yes ASA: Yes Other anticoagulants/antiplatelets: no  Past Medical History  Diagnosis Date  . Hypercholesterolemia   . Hypertension   . Macular degeneration   . Hx of radiation therapy 03/22/11 to 05/08/11    PSA recurrent carcinoma of prostate  . Heart murmur   . Peripheral vascular disease   . Stones in the urinary tract   . 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 Prostate cancer   . Arthritis   . Macular degeneration of both eyes   . Blind right eye   . Colon polyps 2004, 07, 14  . Cataract     Social History History  Substance Use Topics  . Smoking status: Former Smoker -- 3.00 packs/day for 20 years    Types: Cigarettes    Quit date: 07/01/2002  . Smokeless tobacco: Never Used  . Alcohol Use: No    Family History Family History  Problem Relation Age of Onset  . Prostate cancer Brother 63  . Colon cancer Neg Hx   . Aneurysm Father     Past Surgical History  Procedure Laterality Date  . Prostatectomy retropubic radical   07/15/2002    with nerve sparing, Gleason 3+4=7  . Penile prosthesis implant    . Penile prosthesis  removal    . Prostate biopsy    . Hernia repair      right side,lft  . Femoral-popliteal bypass graft  07/19/2011    Procedure: BYPASS GRAFT FEMORAL-POPLITEAL ARTERY;  Surgeon: Mal Misty, MD;  Location: [facility] OR;  Service: Vascular;  Laterality: Right;  Right Femoral - Popliteal  Bypss with saphenous vein  . Intraoperative arteriogram  07/19/2011    Procedure: INTRA OPERATIVE ARTERIOGRAM;  Surgeon: Mal Misty, MD;  Location: [facility];  Service: Vascular;  Laterality: Right;  . Pr vein bypass graft,aorto-fem-pop  07/19/11    Right  Fem/Pop BPG  . Colonoscopy w/ biopsies      multiple   . Transthoracic echocardiogram  January    Normal EF and overall function  . Nm myoview ltd  June 2013    Normal EF, very small area of basal-mid inferior ischemia; LOW RISK  . Reclast  July 25, 2013    Allergies  Allergen Reactions  . Avelox [Moxifloxacin Hcl In Nacl]     diarrhea    Current Outpatient Prescriptions  Medication Sig Dispense Refill  . acetaminophen (TYLENOL) 325 MG tablet Take 650 mg by mouth every 6 (six) hours as needed. For pain      . amLODipine (NORVASC) 10 MG  tablet Take 10 mg by mouth daily.      Marland Kitchen aspirin 81 MG tablet Take 81 mg by mouth daily.      . Calcium Carbonate-Vitamin D (CALTRATE 600+D PO) Take 1 tablet by mouth daily.      Marland Kitchen docusate sodium (COLACE) 100 MG capsule Take 100 mg by mouth at bedtime.      . fluticasone (FLONASE) 50 MCG/ACT nasal spray Place 2 sprays into both nostrils as needed.       Marland Kitchen ibuprofen (ADVIL,MOTRIN) 200 MG tablet Take 200 mg by mouth every 6 (six) hours as needed. For pain      . latanoprost (XALATAN) 0.005 % ophthalmic solution daily. One drop Left eye every day      . losartan-hydrochlorothiazide (HYZAAR) 100-12.5 MG per tablet Take 1 tablet by mouth daily.      . LUTEIN PO Take 1 tablet by mouth daily.      . metoprolol succinate  (TOPROL-XL) 25 MG 24 hr tablet Take 1 tablet (25 mg total) by mouth daily.  30 tablet  0  . Omega-3 Fatty Acids (FISH OIL) 1200 MG CAPS Take 3,600 mg by mouth daily.      . polyethylene glycol (MIRALAX / GLYCOLAX) packet Take 17 g by mouth at bedtime.      . rosuvastatin (CRESTOR) 10 MG tablet Take 10 mg by mouth daily.       No current facility-administered medications for this visit.    ROS: See HPI for pertinent positives and negatives.   Physical Examination  Filed Vitals:   09/25/13 1220  BP: 116/71  Pulse: 74  Resp: 16  Height: 5\' 8"  (1.727 m)  Weight: 161 lb (73.029 kg)  SpO2: 98%   Body mass index is 24.49 kg/(m^2).  General: A&O x 3, WDWN. Gait: normal Eyes: PERRLA. Pulmonary: CTAB, without wheezes , rales or rhonchi. Cardiac: regular Rythm , without detected murmur.         Carotid Bruits Right Left   Negative Negative  Aorta is not palpable. Radial pulses: are 2+ palpable and =                           VASCULAR EXAM: Extremities without ischemic changes  without Gangrene; without open wounds. No edema.                                                                                                          LE Pulses Right Left       FEMORAL  3+ palpable  1+ palpable        POPLITEAL  not palpable   not palpable       POSTERIOR TIBIAL  biphasic by Doppler    monophasic by Doppler        DORSALIS PEDIS      ANTERIOR TIBIAL monophasic by Doppler  monophasic by Doppler    Abdomen: soft, NT, no masses. Skin: no rashes, no ulcers noted. Musculoskeletal: no muscle wasting or atrophy.  Neurologic: A&O X 3;  Appropriate Affect ; SENSATION: normal; MOTOR FUNCTION:  moving all extremities equally, motor strength 5/5 throughout. Speech is fluent/normal. CN 2-12 intact.    Non-Invasive Vascular Imaging: DATE:  (09/17/13) LOWER EXTREMITY ARTERIAL DUPLEX EVALUATION    INDICATION: PVD    PREVIOUS INTERVENTION(S): Right common femoral to below knee popliteal  artery bypass graft 07/19/2011 with right external iliac and common femoral artery endarterectomies.    DUPLEX EXAM: Right bypass evaluation    RIGHT  LEFT   Peak Systolic Velocity (cm/s) Ratio (if abnormal) Waveform  Peak Systolic Velocity (cm/s) Ratio (if abnormal) Waveform  30  B Inflow Artery     54  T Proximal Anastomosis     -  - Proximal Graft     -  - Mid Graft     -  -  Distal Graft     -  - Distal Anastomosis     9  DM Outflow Artery     .34 Today's ABI / TBI .75  .71 Previous ABI / TBI (  09/10/12) .74    Waveform:    M - Monophasic       B - Biphasic       T - Triphasic  If Ankle Brachial Index (ABI) or Toe Brachial Index (TBI) performed, please see complete report     ADDITIONAL FINDINGS:     IMPRESSION: 1 .Biphasic flow in the right proximal femoral artery. 2. Unable to visualize the femoral to popliteal bypass graft with dampened monophasic flow noted in the popliteal artery suggestive of occlusion. 3. Technically difficult exam due to patients inability to cooperate due to jerking of legs.    Compared to the previous exam:  No change    ASSESSMENT: Lance Dillon is a 73 y.o. male who is s/p right common femoral to below knee popliteal artery bypass graft 07/19/2011 with right external iliac and common femoral artery endarterectomies. He seem to have no claudication symptoms, the right hip pain improves with more walking. He walks a couple of miles daily. ABI's remain stable from a year ago, both legs with evidence of moderate arterial occlusive disease. He was a 3 PPD smoker for many years until 2004 when he quit. Fortunately he does not have DM.    PLAN:  I discussed in depth with the patient the nature of atherosclerosis, and emphasized the importance of maximal medical management including strict control of blood pressure, blood glucose, and lipid levels, obtaining regular exercise, and continued cessation of smoking.  The patient is aware that without  maximal medical management the underlying atherosclerotic disease process will progress, limiting the benefit of any interventions.  Based on the patient's vascular studies and examination, pt will return to clinic in 1 year for ABI's and right LE arterial Duplex.    The patient was given information about PAD including signs, symptoms, treatment, what symptoms should prompt the patient to seek immediate medical care, and risk reduction measures to take.  Clemon Chambers, RN, MSN, FNP-C [facility] Phone: [phone]  Clinic MD: Oneida Alar  09/25/2013 12:22 PM

## 2013-09-25 NOTE — Patient Instructions (Signed)

## 2013-11-30 DIAGNOSIS — I671 Cerebral aneurysm, nonruptured: Secondary | ICD-10-CM

## 2013-11-30 DIAGNOSIS — I6529 Occlusion and stenosis of unspecified carotid artery: Secondary | ICD-10-CM

## 2013-11-30 HISTORY — DX: Cerebral aneurysm, nonruptured: I67.1

## 2013-11-30 HISTORY — DX: Occlusion and stenosis of unspecified carotid artery: I65.29

## 2013-12-04 ENCOUNTER — Other Ambulatory Visit (HOSPITAL_COMMUNITY): Payer: Self-pay | Admitting: Cardiology

## 2013-12-04 DIAGNOSIS — I701 Atherosclerosis of renal artery: Secondary | ICD-10-CM

## 2013-12-12 ENCOUNTER — Ambulatory Visit (HOSPITAL_COMMUNITY)
Admission: RE | Admit: 2013-12-12 | Discharge: 2013-12-12 | Disposition: A | Discharge status: Home / Self Care | Payer: Medicare Other | Source: Ambulatory Visit | Attending: Internal Medicine | Admitting: Internal Medicine

## 2013-12-12 DIAGNOSIS — I701 Atherosclerosis of renal artery: Secondary | ICD-10-CM

## 2013-12-12 NOTE — Progress Notes (Signed)
Renal Duplex Completed. Jolleen Seman, BS, RDMS, RVT  

## 2013-12-15 ENCOUNTER — Emergency Department (HOSPITAL_COMMUNITY): Payer: Medicare Other

## 2013-12-15 ENCOUNTER — Encounter (HOSPITAL_COMMUNITY): Payer: Self-pay | Admitting: Emergency Medicine

## 2013-12-15 ENCOUNTER — Inpatient Hospital Stay (HOSPITAL_COMMUNITY)
Admission: EM | Admit: 2013-12-15 | Discharge: 2013-12-16 | DRG: 149 | Disposition: A | Discharge status: Home / Self Care | Payer: Medicare Other | Attending: Internal Medicine | Admitting: Internal Medicine

## 2013-12-15 DIAGNOSIS — R42 Dizziness and giddiness: Secondary | ICD-10-CM | POA: Diagnosis not present

## 2013-12-15 DIAGNOSIS — H81399 Other peripheral vertigo, unspecified ear: Secondary | ICD-10-CM | POA: Diagnosis not present

## 2013-12-15 DIAGNOSIS — E785 Hyperlipidemia, unspecified: Secondary | ICD-10-CM | POA: Diagnosis present

## 2013-12-15 DIAGNOSIS — Z923 Personal history of irradiation: Secondary | ICD-10-CM

## 2013-12-15 DIAGNOSIS — I10 Essential (primary) hypertension: Secondary | ICD-10-CM | POA: Diagnosis present

## 2013-12-15 DIAGNOSIS — Z79899 Other long term (current) drug therapy: Secondary | ICD-10-CM

## 2013-12-15 DIAGNOSIS — Z86010 Personal history of colonic polyps: Secondary | ICD-10-CM

## 2013-12-15 DIAGNOSIS — H8309 Labyrinthitis, unspecified ear: Secondary | ICD-10-CM | POA: Diagnosis present

## 2013-12-15 DIAGNOSIS — I739 Peripheral vascular disease, unspecified: Secondary | ICD-10-CM | POA: Diagnosis present

## 2013-12-15 DIAGNOSIS — H3530 Unspecified macular degeneration: Secondary | ICD-10-CM | POA: Diagnosis present

## 2013-12-15 DIAGNOSIS — I701 Atherosclerosis of renal artery: Secondary | ICD-10-CM | POA: Diagnosis present

## 2013-12-15 DIAGNOSIS — Z87891 Personal history of nicotine dependence: Secondary | ICD-10-CM

## 2013-12-15 DIAGNOSIS — Z791 Long term (current) use of non-steroidal anti-inflammatories (NSAID): Secondary | ICD-10-CM

## 2013-12-15 DIAGNOSIS — Z8546 Personal history of malignant neoplasm of prostate: Secondary | ICD-10-CM

## 2013-12-15 DIAGNOSIS — Z881 Allergy status to other antibiotic agents status: Secondary | ICD-10-CM

## 2013-12-15 DIAGNOSIS — Z8042 Family history of malignant neoplasm of prostate: Secondary | ICD-10-CM

## 2013-12-15 DIAGNOSIS — H5441 Blindness, right eye, normal vision left eye: Secondary | ICD-10-CM | POA: Diagnosis present

## 2013-12-15 DIAGNOSIS — Z7982 Long term (current) use of aspirin: Secondary | ICD-10-CM

## 2013-12-15 DIAGNOSIS — M1990 Unspecified osteoarthritis, unspecified site: Secondary | ICD-10-CM | POA: Diagnosis present

## 2013-12-15 DIAGNOSIS — D649 Anemia, unspecified: Secondary | ICD-10-CM | POA: Diagnosis present

## 2013-12-15 DIAGNOSIS — I6523 Occlusion and stenosis of bilateral carotid arteries: Secondary | ICD-10-CM | POA: Diagnosis present

## 2013-12-15 DIAGNOSIS — E780 Pure hypercholesterolemia: Secondary | ICD-10-CM | POA: Diagnosis present

## 2013-12-15 DIAGNOSIS — I671 Cerebral aneurysm, nonruptured: Secondary | ICD-10-CM | POA: Diagnosis present

## 2013-12-15 LAB — DIFFERENTIAL
Basophils Absolute: 0 10*3/uL (ref 0.0–0.1)
Basophils Relative: 1 % (ref 0–1)
EOS ABS: 0.2 10*3/uL (ref 0.0–0.7)
Eosinophils Relative: 5 % (ref 0–5)
LYMPHS ABS: 1.3 10*3/uL (ref 0.7–4.0)
Lymphocytes Relative: 31 % (ref 12–46)
Monocytes Absolute: 0.5 10*3/uL (ref 0.1–1.0)
Monocytes Relative: 11 % (ref 3–12)
NEUTROS ABS: 2.2 10*3/uL (ref 1.7–7.7)
NEUTROS PCT: 52 % (ref 43–77)

## 2013-12-15 LAB — CBC
HCT: 35.3 % — ABNORMAL LOW (ref 39.0–52.0)
Hemoglobin: 12.6 g/dL — ABNORMAL LOW (ref 13.0–17.0)
MCH: 32.5 pg (ref 26.0–34.0)
MCHC: 35.7 g/dL (ref 30.0–36.0)
MCV: 91 fL (ref 78.0–100.0)
Platelets: 173 10*3/uL (ref 150–400)
RBC: 3.88 MIL/uL — AB (ref 4.22–5.81)
RDW: 12.6 % (ref 11.5–15.5)
WBC: 4.3 10*3/uL (ref 4.0–10.5)

## 2013-12-15 LAB — COMPREHENSIVE METABOLIC PANEL
ALBUMIN: 3.7 g/dL (ref 3.5–5.2)
ALK PHOS: 44 U/L (ref 39–117)
ALT: 15 U/L (ref 0–53)
AST: 19 U/L (ref 0–37)
Anion gap: 11 (ref 5–15)
BUN: 30 mg/dL — ABNORMAL HIGH (ref 6–23)
CO2: 29 mEq/L (ref 19–32)
Calcium: 9.4 mg/dL (ref 8.4–10.5)
Chloride: 99 mEq/L (ref 96–112)
Creatinine, Ser: 1.3 mg/dL (ref 0.50–1.35)
GFR calc Af Amer: 61 mL/min — ABNORMAL LOW (ref >90)
GFR calc non Af Amer: 53 mL/min — ABNORMAL LOW (ref >90)
Glucose, Bld: 103 mg/dL — ABNORMAL HIGH (ref 70–99)
POTASSIUM: 3.7 meq/L (ref 3.7–5.3)
Sodium: 139 mEq/L (ref 137–147)
TOTAL PROTEIN: 6.9 g/dL (ref 6.0–8.3)
Total Bilirubin: 0.8 mg/dL (ref 0.3–1.2)

## 2013-12-15 LAB — CBG MONITORING, ED: Glucose-Capillary: 79 mg/dL (ref 70–99)

## 2013-12-15 LAB — APTT: APTT: 28 s (ref 24–37)

## 2013-12-15 LAB — I-STAT TROPONIN, ED: Troponin i, poc: 0.02 ng/mL (ref 0.00–0.08)

## 2013-12-15 LAB — PROTIME-INR
INR: 1.02 (ref 0.00–1.49)
PROTHROMBIN TIME: 13.5 s (ref 11.6–15.2)

## 2013-12-15 MED ORDER — ONDANSETRON HCL 4 MG/2ML IJ SOLN
4.0000 mg | Freq: Once | INTRAMUSCULAR | Status: AC
  Administered 2013-12-16: 4 mg via INTRAVENOUS
  Filled 2013-12-15: qty 2

## 2013-12-15 NOTE — ED Notes (Signed)
Per EMS, Pt sts that after he got home from dinner he stood up and felt dizzy and has felt dizzy ever since. Pt has vomited 3 times since dinner. Pt sts he no longer feels nauseous. Dizziness upon standing, no orthostatic BP changes. A&Ox4.

## 2013-12-15 NOTE — ED Notes (Signed)
Bed: RESB Expected date:  Expected time:  Means of arrival:  Comments: EMS 73 yo male with dizziness

## 2013-12-16 ENCOUNTER — Encounter (HOSPITAL_COMMUNITY): Payer: Self-pay | Admitting: Internal Medicine

## 2013-12-16 ENCOUNTER — Inpatient Hospital Stay (HOSPITAL_COMMUNITY): Payer: Medicare Other

## 2013-12-16 DIAGNOSIS — H5441 Blindness, right eye, normal vision left eye: Secondary | ICD-10-CM | POA: Diagnosis present

## 2013-12-16 DIAGNOSIS — I10 Essential (primary) hypertension: Secondary | ICD-10-CM

## 2013-12-16 DIAGNOSIS — E785 Hyperlipidemia, unspecified: Secondary | ICD-10-CM | POA: Diagnosis present

## 2013-12-16 DIAGNOSIS — I739 Peripheral vascular disease, unspecified: Secondary | ICD-10-CM

## 2013-12-16 DIAGNOSIS — H81399 Other peripheral vertigo, unspecified ear: Secondary | ICD-10-CM | POA: Diagnosis present

## 2013-12-16 DIAGNOSIS — R42 Dizziness and giddiness: Secondary | ICD-10-CM | POA: Diagnosis present

## 2013-12-16 DIAGNOSIS — Z881 Allergy status to other antibiotic agents status: Secondary | ICD-10-CM | POA: Diagnosis not present

## 2013-12-16 DIAGNOSIS — H8309 Labyrinthitis, unspecified ear: Secondary | ICD-10-CM | POA: Diagnosis present

## 2013-12-16 DIAGNOSIS — I6523 Occlusion and stenosis of bilateral carotid arteries: Secondary | ICD-10-CM | POA: Diagnosis present

## 2013-12-16 DIAGNOSIS — I701 Atherosclerosis of renal artery: Secondary | ICD-10-CM | POA: Diagnosis present

## 2013-12-16 DIAGNOSIS — Z86010 Personal history of colonic polyps: Secondary | ICD-10-CM | POA: Diagnosis not present

## 2013-12-16 DIAGNOSIS — E780 Pure hypercholesterolemia: Secondary | ICD-10-CM | POA: Diagnosis present

## 2013-12-16 DIAGNOSIS — Z79899 Other long term (current) drug therapy: Secondary | ICD-10-CM | POA: Diagnosis not present

## 2013-12-16 DIAGNOSIS — Z791 Long term (current) use of non-steroidal anti-inflammatories (NSAID): Secondary | ICD-10-CM | POA: Diagnosis not present

## 2013-12-16 DIAGNOSIS — D649 Anemia, unspecified: Secondary | ICD-10-CM | POA: Diagnosis present

## 2013-12-16 DIAGNOSIS — Z8042 Family history of malignant neoplasm of prostate: Secondary | ICD-10-CM | POA: Diagnosis not present

## 2013-12-16 DIAGNOSIS — M1990 Unspecified osteoarthritis, unspecified site: Secondary | ICD-10-CM | POA: Diagnosis present

## 2013-12-16 DIAGNOSIS — Z87891 Personal history of nicotine dependence: Secondary | ICD-10-CM | POA: Diagnosis not present

## 2013-12-16 DIAGNOSIS — I671 Cerebral aneurysm, nonruptured: Secondary | ICD-10-CM | POA: Diagnosis present

## 2013-12-16 DIAGNOSIS — Z923 Personal history of irradiation: Secondary | ICD-10-CM | POA: Diagnosis not present

## 2013-12-16 DIAGNOSIS — Z7982 Long term (current) use of aspirin: Secondary | ICD-10-CM | POA: Diagnosis not present

## 2013-12-16 DIAGNOSIS — Z8546 Personal history of malignant neoplasm of prostate: Secondary | ICD-10-CM | POA: Diagnosis not present

## 2013-12-16 DIAGNOSIS — H3530 Unspecified macular degeneration: Secondary | ICD-10-CM | POA: Diagnosis present

## 2013-12-16 LAB — CBC WITH DIFFERENTIAL/PLATELET
Basophils Absolute: 0.1 10*3/uL (ref 0.0–0.1)
Basophils Relative: 2 % — ABNORMAL HIGH (ref 0–1)
Eosinophils Absolute: 0.2 10*3/uL (ref 0.0–0.7)
Eosinophils Relative: 4 % (ref 0–5)
HCT: 35.2 % — ABNORMAL LOW (ref 39.0–52.0)
HEMOGLOBIN: 12.3 g/dL — AB (ref 13.0–17.0)
LYMPHS PCT: 29 % (ref 12–46)
Lymphs Abs: 1.3 10*3/uL (ref 0.7–4.0)
MCH: 31.9 pg (ref 26.0–34.0)
MCHC: 34.9 g/dL (ref 30.0–36.0)
MCV: 91.4 fL (ref 78.0–100.0)
Monocytes Absolute: 0.5 10*3/uL (ref 0.1–1.0)
Monocytes Relative: 11 % (ref 3–12)
NEUTROS ABS: 2.5 10*3/uL (ref 1.7–7.7)
Neutrophils Relative %: 54 % (ref 43–77)
PLATELETS: 172 10*3/uL (ref 150–400)
RBC: 3.85 MIL/uL — ABNORMAL LOW (ref 4.22–5.81)
RDW: 12.7 % (ref 11.5–15.5)
WBC: 4.6 10*3/uL (ref 4.0–10.5)

## 2013-12-16 LAB — COMPREHENSIVE METABOLIC PANEL
ALK PHOS: 43 U/L (ref 39–117)
ALT: 15 U/L (ref 0–53)
AST: 20 U/L (ref 0–37)
Albumin: 3.6 g/dL (ref 3.5–5.2)
Anion gap: 14 (ref 5–15)
BUN: 28 mg/dL — ABNORMAL HIGH (ref 6–23)
CO2: 26 mEq/L (ref 19–32)
Calcium: 9.2 mg/dL (ref 8.4–10.5)
Chloride: 100 mEq/L (ref 96–112)
Creatinine, Ser: 1.17 mg/dL (ref 0.50–1.35)
GFR calc non Af Amer: 60 mL/min — ABNORMAL LOW (ref >90)
GFR, EST AFRICAN AMERICAN: 70 mL/min — AB (ref >90)
GLUCOSE: 105 mg/dL — AB (ref 70–99)
Potassium: 3.9 mEq/L (ref 3.7–5.3)
SODIUM: 140 meq/L (ref 137–147)
TOTAL PROTEIN: 6.6 g/dL (ref 6.0–8.3)
Total Bilirubin: 1 mg/dL (ref 0.3–1.2)

## 2013-12-16 LAB — GLUCOSE, CAPILLARY: Glucose-Capillary: 86 mg/dL (ref 70–99)

## 2013-12-16 MED ORDER — METOPROLOL SUCCINATE ER 25 MG PO TB24
25.0000 mg | ORAL_TABLET | Freq: Every day | ORAL | Status: DC
  Filled 2013-12-16: qty 1

## 2013-12-16 MED ORDER — SODIUM CHLORIDE 0.9 % IV SOLN
INTRAVENOUS | Status: DC
  Administered 2013-12-16: 06:00:00 via INTRAVENOUS

## 2013-12-16 MED ORDER — POLYETHYLENE GLYCOL 3350 17 G PO PACK: 17.0000 g | PACK | Freq: Every day | ORAL | Status: DC

## 2013-12-16 MED ORDER — MECLIZINE HCL 25 MG PO TABS
25.0000 mg | ORAL_TABLET | Freq: Once | ORAL | Status: AC
  Administered 2013-12-16: 25 mg via ORAL
  Filled 2013-12-16: qty 1

## 2013-12-16 MED ORDER — ASPIRIN 81 MG PO CHEW
81.0000 mg | CHEWABLE_TABLET | Freq: Every day | ORAL | Status: DC
  Filled 2013-12-16: qty 1

## 2013-12-16 MED ORDER — AMLODIPINE BESYLATE 10 MG PO TABS
10.0000 mg | ORAL_TABLET | Freq: Every day | ORAL | Status: DC
  Filled 2013-12-16: qty 1

## 2013-12-16 MED ORDER — ROSUVASTATIN CALCIUM 10 MG PO TABS
10.0000 mg | ORAL_TABLET | Freq: Every day | ORAL | Status: DC
  Filled 2013-12-16: qty 1

## 2013-12-16 MED ORDER — LATANOPROST 0.005 % OP SOLN
1.0000 [drp] | Freq: Every day | OPHTHALMIC | Status: DC
  Filled 2013-12-16: qty 2.5

## 2013-12-16 MED ORDER — LOSARTAN POTASSIUM 50 MG PO TABS
100.0000 mg | ORAL_TABLET | Freq: Every day | ORAL | Status: DC
  Filled 2013-12-16: qty 2

## 2013-12-16 MED ORDER — OMEGA-3-ACID ETHYL ESTERS 1 G PO CAPS
1.0000 g | ORAL_CAPSULE | Freq: Two times a day (BID) | ORAL | Status: DC
  Administered 2013-12-16: 1 g via ORAL
  Filled 2013-12-16: qty 1

## 2013-12-16 MED ORDER — DOCUSATE SODIUM 100 MG PO CAPS: 100.0000 mg | ORAL_CAPSULE | Freq: Every day | ORAL | Status: DC

## 2013-12-16 MED ORDER — FISH OIL 1200 MG PO CAPS: 3600.0000 mg | ORAL_CAPSULE | Freq: Every day | ORAL | Status: DC

## 2013-12-16 MED ORDER — FLUTICASONE PROPIONATE 50 MCG/ACT NA SUSP
2.0000 | NASAL | Status: DC | PRN
  Filled 2013-12-16: qty 16

## 2013-12-16 NOTE — Evaluation (Signed)
Physical Therapy Evaluation Patient Details Name: Lance Dillon MRN: [id number] DOB: 07/19/1940 Today's Date: 12/16/2013   History of Present Illness  73 yo male admitted with dizziness. MRI+ for aneurysm. Hx of htn, prostate cancer, macular degeneration, R sided occluded ICA.   Clinical Impression  On eval pt was supervision level for mobility-able to walk ~175 feet without assistive device. Pt reports no further dizziness. Challenged pt with head turns, 360 degree turns, picking up objects, starting/stopping ambulation, changes in direction-pt denied dizziness with all tasks. Vestibular evaluation no longer warranted at this time. Educated pt on changing positions slowly and to stop and rest/sit if dizziness should return. No further PT/OT needs at this time. Will sign off.     Follow Up Recommendations No PT follow up    Equipment Recommendations  None recommended by PT    Recommendations for Other Services       Precautions / Restrictions Precautions Precautions: Fall Restrictions Weight Bearing Restrictions: No      Mobility  Bed Mobility Overal bed mobility: Modified Independent                Transfers Overall transfer level: Modified independent                  Ambulation/Gait Ambulation/Gait assistance: Supervision Ambulation Distance (Feet): 175 Feet Assistive device: None Gait Pattern/deviations: Step-through pattern     General Gait Details: slow gait speed but steady. No LOB. No dizziness.  Stairs Stairs: Yes Stairs assistance: Supervision Stair Management: One rail Right;Forwards;Step to pattern Number of Stairs: 2    Wheelchair Mobility    Modified Rankin (Stroke Patients Only)       Balance Overall balance assessment: Needs assistance   Sitting balance-Leahy Scale: Normal     Standing balance support: No upper extremity supported Standing balance-Leahy Scale: Good               High level balance activites:  Direction changes;Turns;Sudden stops;Head turns High Level Balance Comments: Pt performed tasks well. Denied dizziness.              Pertinent Vitals/Pain Pain Assessment: No/denies pain    Home Living Family/patient expects to be discharged to:: Private residence Living Arrangements: Spouse/significant other Available Help at Discharge: Family Type of Home: House Home Access: Stairs to enter Entrance Stairs-Rails: None Technical brewer of Steps: 2 Home Layout: One level Home Equipment: None      Prior Function Level of Independence: Independent               Hand Dominance        Extremity/Trunk Assessment   Upper Extremity Assessment: Overall WFL for tasks assessed           Lower Extremity Assessment: Overall WFL for tasks assessed      Cervical / Trunk Assessment: Normal  Communication   Communication: No difficulties  Cognition Arousal/Alertness: Awake/alert Behavior During Therapy: WFL for tasks assessed/performed Overall Cognitive Status: Within Functional Limits for tasks assessed                      General Comments      Exercises        Assessment/Plan    PT Assessment Patent does not need any further PT services  PT Diagnosis     PT Problem List    PT Treatment Interventions     PT Goals (Current goals can be found in the Care Plan section) Acute Rehab PT Goals  Patient Stated Goal: home hopefully today PT Goal Formulation: All assessment and education complete, DC therapy    Frequency     Barriers to discharge        Co-evaluation               End of Session Equipment Utilized During Treatment: Gait belt Activity Tolerance: Patient tolerated treatment well Patient left: in bed;with call bell/phone within reach;with family/visitor present           Time: 1400-1411 PT Time Calculation (min) (ACUTE ONLY): 11 min   Charges:   PT Evaluation $Initial PT Evaluation Tier I: 1 Procedure PT  Treatments $Gait Training: 8-22 mins   PT G Codes:          Weston Anna, MPT Pager: [phone]

## 2013-12-16 NOTE — Care Management Note (Signed)
    Page 1 of 1   12/16/2013     12:18:20 PM CARE MANAGEMENT NOTE 12/16/2013  Patient:  Lance Dillon, Lance Dillon   Account Number:  [id number]  Date Initiated:  12/16/2013  Documentation initiated by:  Dessa Phi  Subjective/Objective Assessment:   73 Y/O M ADMITTED W/DIZZINESS/VERTIGO.ANEURYSM     Action/Plan:   FROM HOME W/SPOUSE.   Anticipated DC Date:  12/19/2013   Anticipated DC Plan:  [place]  CM consult      Choice offered to / List presented to:             Status of service:  In process, will continue to follow Medicare Important Message given?   (If response is "NO", the following Medicare IM given date fields will be blank) Date Medicare IM given:   Medicare IM given by:   Date Additional Medicare IM given:   Additional Medicare IM given by:    Discharge Disposition:    Per UR Regulation:  Reviewed for med. necessity/level of care/duration of stay  If discussed at [place] of Stay Meetings, dates discussed:    Comments:  12/16/13 Trudy Kory RN,BSN NCM [phone] AWAIT PT RECOMMENDATIONS.

## 2013-12-16 NOTE — Discharge Summary (Signed)
Physician Discharge Summary  Lance Dillon [id number] DOB: [birth year] DOA: 12/15/2013  PCP: Thressa Sheller, MD  Admit date: 12/15/2013 Discharge date: 12/16/2013  Time spent: 45 minutes  Recommendations for Outpatient Follow-up:  -Will be discharged home today. -Advised to follow up with PCP in 2 weeks.   Discharge Diagnoses:  Principal Problem:   Dizziness Active Problems:   Peripheral vascular disease   Essential hypertension   Hyperlipidemia LDL goal <100   Vertigo   Discharge Condition: Stable and improved  Filed Weights   12/15/13 2145 12/16/13 0200  Weight: 71.668 kg (158 lb) 73.074 kg (161 lb 1.6 oz)    History of present illness:  Lance Dillon is a 72 y.o. male with history of hypertension, hyperlipidemia and peripheral vascular disease and right sided occluded ICA started experiencing dizziness last evening after having dinner from a restaurant. Patient also had at least episode of nausea and vomiting. Since his dizziness persisted patient came to the ER. CT head was done which showed possible aneurysm in the basilar artery. On-call neurologist was consulted and patient has been admitted for further management. Patient denies any weakness of upper or lower extremities. Patient did not have any headache or visual symptoms. Patient's dizziness is markedly improved but still persists. Patient denies any palpitations chest pain or shortness of breath. Denies any new medications recently. Denies any abdominal pain or diarrhea.   Hospital Course:   Dizziness -Appears to be peripheral vertigo, likely acute laberynthitis as he is recovering from a recent URI. -MRI negative for CVA. -Has been seen by neurology and vestibular PT without further input.  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 -Has bilateral ICA stenosis and all of his brain circulation is coming from this artery. Correcting this aneurysm (if this is even possible) could have catastrophic  effects. -Agree with neurology that no further interventions are required.  Procedures:  None   Consultations:  Neurology  Discharge Instructions  Discharge Instructions    Diet - low sodium heart healthy    Complete by:  As directed      Increase activity slowly    Complete by:  As directed             Medication List    TAKE these medications        acetaminophen 325 MG tablet  Commonly known as:  TYLENOL  Take 650 mg by mouth every 6 (six) hours as needed. For pain     amLODipine 10 MG tablet  Commonly known as:  NORVASC  Take 10 mg by mouth daily.     aspirin 81 MG tablet  Take 81 mg by mouth daily.     CALTRATE 600+D PO  Take 1 tablet by mouth 2 (two) times daily.     docusate sodium 100 MG capsule  Commonly known as:  COLACE  Take 100 mg by mouth at bedtime.     Fish Oil 1200 MG Caps  Take 3,600 mg by mouth daily.     fluticasone 50 MCG/ACT nasal spray  Commonly known as:  FLONASE  Place 2 sprays into both nostrils as needed (for nasal congestion).     ibuprofen 200 MG tablet  Commonly known as:  ADVIL,MOTRIN  Take 200 mg by mouth every 6 (six) hours as needed. For pain     latanoprost 0.005 % ophthalmic solution  Commonly known as:  XALATAN  daily. One drop Left eye every day     losartan-hydrochlorothiazide 100-12.5 MG per tablet  Commonly known as:  HYZAAR  Take 1 tablet by mouth daily.     LUTEIN PO  Take 1 tablet by mouth daily.     metoprolol succinate 25 MG 24 hr tablet  Commonly known as:  TOPROL-XL  Take 1 tablet (25 mg total) by mouth daily.     polyethylene glycol packet  Commonly known as:  MIRALAX / GLYCOLAX  Take 17 g by mouth at bedtime.     rosuvastatin 10 MG tablet  Commonly known as:  CRESTOR  Take 10 mg by mouth daily.       Allergies  Allergen Reactions  . Avelox [Moxifloxacin Hcl In Nacl]     diarrhea       Follow-up Information    Follow up with Thressa Sheller, MD. Schedule an appointment as soon as  possible for a visit in 2 weeks.   Specialty:  Internal Medicine   Contact information:   [place], [place] [address] [phone]        The results of significant diagnostics from this hospitalization (including imaging, microbiology, ancillary and laboratory) are listed below for reference.    Significant Diagnostic Studies: Ct Head (brain) Wo Contrast  12/15/2013   CLINICAL DATA:  Dizziness and vomiting. Dizziness on standing. History of prostate cancer.  EXAM: CT HEAD WITHOUT CONTRAST  TECHNIQUE: Contiguous axial images were obtained from the base of the skull through the vertex without intravenous contrast.  COMPARISON:  None.  FINDINGS: Mild diffuse cerebral atrophy. Patchy low-attenuation changes in the deep white matter consistent with small vessel ischemia. 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 No mass effect or midline shift. No abnormal extra-axial fluid collections. Gray-white matter junctions are distinct. Basal cisterns are not effaced. No evidence of acute intracranial hemorrhage. No depressed skull fractures. Opacification of some of the left ethmoid air cells. Mastoid air cells are not opacified. 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 This measures about 7 x 20 mm. Vascular calcifications.  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 No evidence of acute intracranial hemorrhage. CT angiography is suggested for additional characterization. Chronic atrophy and small vessel ischemic changes. 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   Electronically Signed   By: Lucienne Capers M.D.   On: 12/15/2013 22:57   Mr Jodene Nam Head Wo Contrast  12/16/2013   CLINICAL DATA:  Evaluate basilar tip aneurysm. Abnormal CT scan. Dizziness with vomiting.  EXAM: MRI HEAD WITHOUT CONTRAST  MRA HEAD WITHOUT CONTRAST  TECHNIQUE:  Multiplanar, multiecho pulse sequences of the brain and surrounding structures were obtained without intravenous contrast. Angiographic images of the head were obtained using MRA technique without contrast.  COMPARISON:  CT head 12/15/2013.  FINDINGS: MRI HEAD FINDINGS  No restricted diffusion to suggest acute stroke. No mass lesion, hydrocephalus, acute hemorrhage, or extra-axial fluid. Generalized atrophy. Mild cerebral and cerebellar atrophy. Mild subcortical and periventricular T2 and FLAIR hyperintensities, likely chronic microvascular ischemic change.  There is a large fusiform aneurysm involving the basilar tip extending into the RIGHT posterior cerebral artery, described on the MRA section of the report. Both internal carotid arteries are occluded in the skull base segments, described below. Prominent perivascular space seen in the RIGHT thalamus. No areas of chronic hemorrhage. The pituitary stalk is stretched by the a dolichoectatic vertebrobasilar system. There is no tonsillar herniation. There is moderate cervical  spondylosis with anterolisthesis at C3-4, incompletely evaluated. There is no acute sinus or mastoid disease. Grossly negative orbits.  MRA HEAD FINDINGS  The RIGHT internal carotid artery is occluded in its upper cervical, skullbase and cavernous segments, with retrograde filling of the RIGHT ICA terminus.  The LEFT internal carotid artery is also occluded, and is reconstituted in its cavernous segment by ascending pharyngeal and middle meningeal collaterals with a diminutive supraclinoid segment.  The basilar artery is dolichoectatic with both vertebrals contributing, LEFT greater than RIGHT. 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 No evidence for vertebrobasilar dissection.  Hypertrophied RIGHT posterior communicating artery reconstitutes the RIGHT anterior circulation. There is a rudimentary  connection of the LEFT posterior communicating artery with the LEFT ICA, assisting the external collaterals in reconstituting in the LEFT MCA territory. Azygos A1 ACA on the RIGHT supplies both distal anterior cerebrals. No proximal M1 stenosis. No visible M2 or M3 flow limiting lesion. No cerebellar branch occlusion. No intracranial berry aneurysm.  IMPRESSION: No acute stroke. Mild atrophy with chronic microvascular ischemic change.  BILATERAL internal carotid artery occlusion. Collateral flow from the posterior circulation and from external collaterals reconstitute the anterior circulation as described above.  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 No evidence for dissection.   Electronically Signed   By: Rolla Flatten M.D.   On: 12/16/2013 08:36   Mri Brain Without Contrast  12/16/2013   CLINICAL DATA:  Evaluate basilar tip aneurysm. Abnormal CT scan. Dizziness with vomiting.  EXAM: MRI HEAD WITHOUT CONTRAST  MRA HEAD WITHOUT CONTRAST  TECHNIQUE: Multiplanar, multiecho pulse sequences of the brain and surrounding structures were obtained without intravenous contrast. Angiographic images of the head were obtained using MRA technique without contrast.  COMPARISON:  CT head 12/15/2013.  FINDINGS: MRI HEAD FINDINGS  No restricted diffusion to suggest acute stroke. No mass lesion, hydrocephalus, acute hemorrhage, or extra-axial fluid. Generalized atrophy. Mild cerebral and cerebellar atrophy. Mild subcortical and periventricular T2 and FLAIR hyperintensities, likely chronic microvascular ischemic change.  There is a large fusiform aneurysm involving the basilar tip extending into the RIGHT posterior cerebral artery, described on the MRA section of the report. Both internal carotid arteries are occluded in the skull base segments, described below. Prominent perivascular space seen in the RIGHT thalamus. No areas of chronic hemorrhage. The pituitary stalk is stretched by the a  dolichoectatic vertebrobasilar system. There is no tonsillar herniation. There is moderate cervical spondylosis with anterolisthesis at C3-4, incompletely evaluated. There is no acute sinus or mastoid disease. Grossly negative orbits.  MRA HEAD FINDINGS  The RIGHT internal carotid artery is occluded in its upper cervical, skullbase and cavernous segments, with retrograde filling of the RIGHT ICA terminus.  The LEFT internal carotid artery is also occluded, and is reconstituted in its cavernous segment by ascending pharyngeal and middle meningeal collaterals with a diminutive supraclinoid segment.  The basilar artery is dolichoectatic with both vertebrals contributing, LEFT greater than RIGHT. 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 No evidence for vertebrobasilar dissection.  Hypertrophied RIGHT posterior communicating artery reconstitutes the RIGHT anterior circulation. There is a rudimentary connection of the LEFT posterior communicating artery with the LEFT ICA, assisting the external collaterals in reconstituting in the LEFT MCA territory. Azygos A1 ACA on the RIGHT supplies both  distal anterior cerebrals. No proximal M1 stenosis. No visible M2 or M3 flow limiting lesion. No cerebellar branch occlusion. No intracranial berry aneurysm.  IMPRESSION: No acute stroke. Mild atrophy with chronic microvascular ischemic change.  BILATERAL internal carotid artery occlusion. Collateral flow from the posterior circulation and from external collaterals reconstitute the anterior circulation as described above.  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 No evidence for dissection.   Electronically Signed   By: Rolla Flatten M.D.   On: 12/16/2013 08:36    Microbiology: No results found for this or any previous visit (from the past 240 hour(s)).   Labs: Basic Metabolic Panel:  Recent  Labs Lab 12/15/13 2155 12/16/13 0611  NA 139 140  K 3.7 3.9  CL 99 100  CO2 29 26  GLUCOSE 103* 105*  BUN 30* 28*  CREATININE 1.30 1.17  CALCIUM 9.4 9.2   Liver Function Tests:  Recent Labs Lab 12/15/13 2155 12/16/13 0611  AST 19 20  ALT 15 15  ALKPHOS 44 43  BILITOT 0.8 1.0  PROT 6.9 6.6  ALBUMIN 3.7 3.6   No results for input(s): LIPASE, AMYLASE in the last 168 hours. No results for input(s): AMMONIA in the last 168 hours. CBC:  Recent Labs Lab 12/15/13 2155 12/16/13 0611  WBC 4.3 4.6  NEUTROABS 2.2 2.5  HGB 12.6* 12.3*  HCT 35.3* 35.2*  MCV 91.0 91.4  PLT 173 172   Cardiac Enzymes: No results for input(s): CKTOTAL, CKMB, CKMBINDEX, TROPONINI in the last 168 hours. BNP: BNP (last 3 results) No results for input(s): PROBNP in the last 8760 hours. CBG:  Recent Labs Lab 12/15/13 2150 12/16/13 1219  GLUCAP 79 86       Signed:  HERNANDEZ ACOSTA,ESTELA  [facility] Pager: [phone] 12/16/2013, 5:13 PM

## 2013-12-16 NOTE — Progress Notes (Signed)
Pt admitted to the unit and ambulated to bed with standby assist. Oriented to room, call light, and staff. VSS at this time. Wife, Inez Catalina, at bedside, no distress noted. Will continue to monitor pt and carry out POC. Carnella Guadalajara I

## 2013-12-16 NOTE — H&P (Signed)
[facility] History and Physical  Lance Dillon [id number] DOB: [birth year] DOA: 12/15/2013  Referring physician:ER physician. PCP: Thressa Sheller, MD  Chief Complaint: Dizziness.  HPI: Lance Dillon is a 73 y.o. male with history of hypertension, hyperlipidemia and peripheral vascular disease and right sided occluded ICA started experiencing dizziness last evening after having dinner from a restaurant. Patient also had at least episode of nausea and vomiting. Since his dizziness persisted patient came to the ER. CT head was done which showed possible aneurysm in the basilar artery. On-call neurologist was consulted and patient has been admitted for further management. Patient denies any weakness of upper or lower extremities. Patient did not have any headache or visual symptoms. Patient's dizziness is markedly improved but still persists. Patient denies any palpitations chest pain or shortness of breath. Denies any new medications recently. Denies any abdominal pain or diarrhea.   Review of Systems: As presented in the history of presenting illness, rest negative.  Past Medical History  Diagnosis Date  . Hypercholesterolemia   . Hypertension   . Macular degeneration   . Hx of radiation therapy 03/22/11 to 05/08/11    PSA recurrent carcinoma of prostate  . Heart murmur   . Peripheral vascular disease   . Stones in the urinary tract   . 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 Prostate cancer   . Arthritis   . Macular degeneration of both eyes   . Blind right eye   . Colon polyps 2004, 07, 14  . Cataract    Past Surgical History  Procedure Laterality Date  . Prostatectomy retropubic radical  07/15/2002    with nerve sparing, Gleason 3+4=7  . Penile prosthesis implant    . Penile prosthesis  removal    . Prostate biopsy    . Hernia repair      right side,lft  . Femoral-popliteal bypass graft  07/19/2011    Procedure: BYPASS GRAFT FEMORAL-POPLITEAL ARTERY;  Surgeon: Mal Misty, MD;  Location: [facility] OR;  Service: Vascular;  Laterality: Right;  Right Femoral - Popliteal  Bypss with saphenous vein  . Intraoperative arteriogram  07/19/2011    Procedure: INTRA OPERATIVE ARTERIOGRAM;  Surgeon: Mal Misty, MD;  Location: [facility];  Service: Vascular;  Laterality: Right;  . Pr vein bypass graft,aorto-fem-pop  07/19/11    Right  Fem/Pop BPG  . Colonoscopy w/ biopsies      multiple   . Transthoracic echocardiogram  January    Normal EF and overall function  . Nm myoview ltd  June 2013    Normal EF, very small area of basal-mid inferior ischemia; LOW RISK  . Reclast  July 25, 2013   Social History:  reports that he quit smoking about 11 years ago. His smoking use included Cigarettes. He has a 60 pack-year smoking history. He has never used smokeless tobacco. He reports that he does not drink alcohol or use illicit drugs. Where does patient live home. Can patient participate in ADLs? Yes.  Allergies  Allergen Reactions  . Avelox [Moxifloxacin Hcl In Nacl]     diarrhea    Family History:  Family History  Problem Relation Age of Onset  . Prostate cancer Brother 58  . Colon cancer Neg Hx   . Aneurysm Father       Prior to Admission medications   Medication Sig Start Date End Date Taking? Authorizing Provider  acetaminophen (TYLENOL) 325 MG tablet Take 650 mg by mouth every  6 (six) hours as needed. For pain   Yes Historical Provider, MD  amLODipine (NORVASC) 10 MG tablet Take 10 mg by mouth daily.   Yes Historical Provider, MD  aspirin 81 MG tablet Take 81 mg by mouth daily.   Yes Historical Provider, MD  Calcium Carbonate-Vitamin D (CALTRATE 600+D PO) Take 1 tablet by mouth 2 (two) times daily.    Yes Historical Provider, MD  docusate sodium (COLACE) 100 MG capsule Take 100 mg by mouth at bedtime.   Yes Historical Provider, MD  latanoprost (XALATAN) 0.005 % ophthalmic solution daily. One drop Left eye every day 09/18/13  Yes Historical Provider, MD   losartan-hydrochlorothiazide (HYZAAR) 100-12.5 MG per tablet Take 1 tablet by mouth daily.   Yes Historical Provider, MD  LUTEIN PO Take 1 tablet by mouth daily.   Yes Historical Provider, MD  metoprolol succinate (TOPROL-XL) 25 MG 24 hr tablet Take 1 tablet (25 mg total) by mouth daily. 07/01/13  Yes Leonie Man, MD  Omega-3 Fatty Acids (FISH OIL) 1200 MG CAPS Take 3,600 mg by mouth daily.   Yes Historical Provider, MD  polyethylene glycol (MIRALAX / GLYCOLAX) packet Take 17 g by mouth at bedtime.   Yes Historical Provider, MD  rosuvastatin (CRESTOR) 10 MG tablet Take 10 mg by mouth daily.   Yes Historical Provider, MD  fluticasone (FLONASE) 50 MCG/ACT nasal spray Place 2 sprays into both nostrils as needed (for nasal congestion).  09/02/12   Historical Provider, MD  ibuprofen (ADVIL,MOTRIN) 200 MG tablet Take 200 mg by mouth every 6 (six) hours as needed. For pain    Historical Provider, MD    Physical Exam: Filed Vitals:   12/15/13 2207 12/16/13 0000 12/16/13 0147 12/16/13 0200  BP:  137/77  125/72  Pulse:  66  67  Temp: 97.8 F (36.6 C)  97.8 F (36.6 C) 97.7 F (36.5 C)  TempSrc:   Oral Oral  Resp:  20  16  Height:    5\' 8"  (1.727 m)  Weight:    73.074 kg (161 lb 1.6 oz)  SpO2:  100%  97%     General:  Well-developed and nourished.  Eyes: anicteric no pallor.  ENT: no discharge from the ears eyes nose and mouth.  Neck: no mass felt.  Cardiovascular: S1-S2 heard.  Respiratory: no rhonchi or crepitations.  Abdomen: soft nontender bowel sounds present.  Skin: no rash.  Musculoskeletal: no edema.  Psychiatric: appears normal.  Neurologic: alert awake oriented to time place and person. Moves all extremities 5 x 5. No facial asymmetry. Tongue is midline. No nystagmus.  Labs on Admission:  Basic Metabolic Panel:  Recent Labs Lab 12/15/13 2155  NA 139  K 3.7  CL 99  CO2 29  GLUCOSE 103*  BUN 30*  CREATININE 1.30  CALCIUM 9.4   Liver Function  Tests:  Recent Labs Lab 12/15/13 2155  AST 19  ALT 15  ALKPHOS 44  BILITOT 0.8  PROT 6.9  ALBUMIN 3.7   No results for input(s): LIPASE, AMYLASE in the last 168 hours. No results for input(s): AMMONIA in the last 168 hours. CBC:  Recent Labs Lab 12/15/13 2155  WBC 4.3  NEUTROABS 2.2  HGB 12.6*  HCT 35.3*  MCV 91.0  PLT 173   Cardiac Enzymes: No results for input(s): CKTOTAL, CKMB, CKMBINDEX, TROPONINI in the last 168 hours.  BNP (last 3 results) No results for input(s): PROBNP in the last 8760 hours. CBG:  Recent Labs Lab 12/15/13 2150  GLUCAP 79    Radiological Exams on Admission: Ct Head (brain) Wo Contrast  12/15/2013   CLINICAL DATA:  Dizziness and vomiting. Dizziness on standing. History of prostate cancer.  EXAM: CT HEAD WITHOUT CONTRAST  TECHNIQUE: Contiguous axial images were obtained from the base of the skull through the vertex without intravenous contrast.  COMPARISON:  None.  FINDINGS: Mild diffuse cerebral atrophy. Patchy low-attenuation changes in the deep white matter consistent with small vessel ischemia. 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 No mass effect or midline shift. No abnormal extra-axial fluid collections. Gray-white matter junctions are distinct. Basal cisterns are not effaced. No evidence of acute intracranial hemorrhage. No depressed skull fractures. Opacification of some of the left ethmoid air cells. Mastoid air cells are not opacified. 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 This measures about 7 x 20 mm. Vascular calcifications.  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 No evidence of acute intracranial hemorrhage. CT angiography is suggested for additional characterization. Chronic atrophy and small vessel ischemic changes. 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   Electronically Signed    By: Lucienne Capers M.D.   On: 12/15/2013 22:57    EKG: Independently reviewed. Normal sinus rhythm.  Assessment/Plan Principal Problem:   Dizziness Active Problems:   Peripheral vascular disease   Essential hypertension   Hyperlipidemia LDL goal <100   1. Dizziness/vertigo - and discussed with on-call neurologist Dr. Janann Colonel who at this time feels patient's symptoms are probably peripheral but also get MRI to rule out stroke and also MRA to further study on patient's possible basilar aneurysm. If MRI shows basilar aneurysm patient will need neurosurgical consult. Patient has been placed on neuro checks and swallow evaluation. 2. Hypertension - continue present medications except holding of diuretics for now. 3. Hyperlipidemia - continue statins. 4. History of peripheral vascular disease on aspirin. 5. Mild anemia - closely follow CBC will need further workup as outpatient.    Code Status: full code.  Family Communication: patient's wife at the bedside.  Disposition Plan: admit to inpatient.    KAKRAKANDY,ARSHAD N. [facility] Pager [phone].  If 7PM-7AM, please contact night-coverage [email or web address] Password [email or web address] 12/16/2013, 4:28 AM

## 2013-12-16 NOTE — Consult Note (Signed)
Consult Reason for Consult:vertigo Referring Physician: Dr Willaim Rayas ED  CC: "I feel off balance"  HPI: Lance Dillon is an 73 y.o. male histotory of HTN, HLD, PVD  presents with acute onset dizziness starting around 6:30 PM this evening after dinner. Describes the sensation as spinning vertigo type sensation. Worse with sitting upright or turning his head from right to left. Associated nausea with vomiting 3. Vertigo episodes last 30 to 60 seconds. States he feels better when lying flat. No previously similar symptoms. Denies any vision changes. No focal weakness, no sensory changes. No hearing changes, tinnitus or pain. No fever or chills. Patient states he has been ambulatory since onset of symptoms. No prior similar symptoms, no prior TIA or stroke.  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   History obtained via patient and his family.  Past Medical History     Past Medical History  Diagnosis Date  . Hypercholesterolemia   . Hypertension   . Macular degeneration   . Hx of radiation therapy 03/22/11 to 05/08/11    PSA recurrent carcinoma of prostate  . Heart murmur   . Peripheral vascular disease   . Stones in the urinary tract   . 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 Prostate cancer   . Arthritis   . Macular degeneration of both eyes   . Blind right eye   . Colon polyps 2004, 07, 14  . Cataract     Past Surgical History  Procedure Laterality Date  . Prostatectomy retropubic radical  07/15/2002    with nerve sparing, Gleason 3+4=7  . Penile prosthesis implant    . Penile prosthesis  removal    . Prostate biopsy    . Hernia repair      right side,lft  . Femoral-popliteal bypass graft  07/19/2011    Procedure: BYPASS GRAFT FEMORAL-POPLITEAL ARTERY;  Surgeon: Mal Misty, MD;  Location: [facility] OR;  Service: Vascular;  Laterality: Right;  Right Femoral - Popliteal  Bypss with saphenous vein  . Intraoperative arteriogram   07/19/2011    Procedure: INTRA OPERATIVE ARTERIOGRAM;  Surgeon: Mal Misty, MD;  Location: [place];  Service: Vascular;  Laterality: Right;  . Pr vein bypass graft,aorto-fem-pop  07/19/11    Right  Fem/Pop BPG  . Colonoscopy w/ biopsies      multiple   . Transthoracic echocardiogram  January    Normal EF and overall function  . Nm myoview ltd  June 2013    Normal EF, very small area of basal-mid inferior ischemia; LOW RISK  . Reclast  July 25, 2013    Family History  Problem Relation Age of Onset  . Prostate cancer Brother 36  . Colon cancer Neg Hx   . Aneurysm Father     Social History:  reports that he quit smoking about 11 years ago. His smoking use included Cigarettes. He has a 60 pack-year smoking history. He has never used smokeless tobacco. He reports that he does not drink alcohol or use illicit drugs.  Allergies  Allergen Reactions  . Avelox [Moxifloxacin Hcl In Nacl]     diarrhea    Medications: Scheduled:  ROS: Out of a complete 14 system review, the patient complains of only the following symptoms, and all other reviewed systems are negative.  Physical Examination: Blood pressure 137/77, pulse 66, temperature 97.8 F (36.6 C), temperature source Oral, resp. rate 20, height 5\' 8"  (1.727  m), weight 71.668 kg (158 lb), SpO2 100 %.  Neurologic Examination Mental Status: Alert, oriented, thought content appropriate.  Speech fluent without evidence of aphasia.  Able to follow 3 step commands without difficulty. Cranial Nerves: II: funduscopic exam wnl bilaterally, visual fields grossly normal, pupils equal, round, reactive to light III,IV, VI: ptosis not present, extra-ocular motions intact bilaterally, 2-3 beats horizontal clonus with horizontal gaze V,VII: smile symmetric, facial light touch sensation normal bilaterally VIII: hearing normal bilaterally IX,X: gag reflex present XI: trapezius strength/neck flexion strength normal bilaterally XII: tongue strength  normal  Motor: Right : Upper extremity    Left:     Upper extremity 5/5 deltoid       5/5 deltoid 5/5 biceps      5/5 biceps  5/5 triceps      5/5 triceps 5/5 hand grip      5/5 hand grip  Lower extremity     Lower extremity 5/5 hip flexor      5/5 hip flexor 5/5 quadricep      5/5 quadriceps  5/5 hamstrings     5/5 hamstrings 5/5 plantar flexion       5/5 plantar flexion 5/5 plantar extension     5/5 plantar extension Tone and bulk:normal tone throughout; no atrophy noted Sensory: Pinprick and light touch intact throughout, bilaterally Deep Tendon Reflexes: 2+ and symmetric throughout Plantars: Right: downgoing   Left: downgoing Cerebellar: normal finger-to-nose, normal rapid alternating movements and normal heel-to-shin test Gait: deferred due to multiple leads  Laboratory Studies:   Basic Metabolic Panel:  Recent Labs Lab 12/15/13 2155  NA 139  K 3.7  CL 99  CO2 29  GLUCOSE 103*  BUN 30*  CREATININE 1.30  CALCIUM 9.4    Liver Function Tests:  Recent Labs Lab 12/15/13 2155  AST 19  ALT 15  ALKPHOS 44  BILITOT 0.8  PROT 6.9  ALBUMIN 3.7   No results for input(s): LIPASE, AMYLASE in the last 168 hours. No results for input(s): AMMONIA in the last 168 hours.  CBC:  Recent Labs Lab 12/15/13 2155  WBC 4.3  NEUTROABS 2.2  HGB 12.6*  HCT 35.3*  MCV 91.0  PLT 173    Cardiac Enzymes: No results for input(s): CKTOTAL, CKMB, CKMBINDEX, TROPONINI in the last 168 hours.  BNP: Invalid input(s): POCBNP  CBG:  Recent Labs Lab 12/15/13 2150  GLUCAP 35    Microbiology: Results for orders placed or performed during the hospital encounter of 07/13/11  Surgical pcr screen     Status: Abnormal   Collection Time: 07/13/11  8:44 AM  Result Value Ref Range Status   MRSA, PCR NEGATIVE NEGATIVE Final   Staphylococcus aureus POSITIVE (A) NEGATIVE Final    Comment:        The Xpert SA Assay (FDA approved for NASAL specimens only), is one component of a  comprehensive surveillance program.  It is not intended to diagnose infection nor to guide or monitor treatment.    Coagulation Studies:  Recent Labs  12/15/13 2155  LABPROT 13.5  INR 1.02    Urinalysis: No results for input(s): COLORURINE, LABSPEC, PHURINE, GLUCOSEU, HGBUR, BILIRUBINUR, KETONESUR, PROTEINUR, UROBILINOGEN, NITRITE, LEUKOCYTESUR in the last 168 hours.  Invalid input(s): APPERANCEUR  Lipid Panel:  No results found for: CHOL, TRIG, HDL, CHOLHDL, VLDL, LDLCALC  HgbA1C: No results found for: HGBA1C  Urine Drug Screen:  No results found for: LABOPIA, COCAINSCRNUR, LABBENZ, AMPHETMU, THCU, LABBARB  Alcohol Level: No results for input(s): ETH in  the last 168 hours.  Other results:  Imaging: Ct Head (brain) Wo Contrast  12/15/2013   CLINICAL DATA:  Dizziness and vomiting. Dizziness on standing. History of prostate cancer.  EXAM: CT HEAD WITHOUT CONTRAST  TECHNIQUE: Contiguous axial images were obtained from the base of the skull through the vertex without intravenous contrast.  COMPARISON:  None.  FINDINGS: Mild diffuse cerebral atrophy. Patchy low-attenuation changes in the deep white matter consistent with small vessel ischemia. 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 No mass effect or midline shift. No abnormal extra-axial fluid collections. Gray-white matter junctions are distinct. Basal cisterns are not effaced. No evidence of acute intracranial hemorrhage. No depressed skull fractures. Opacification of some of the left ethmoid air cells. Mastoid air cells are not opacified. 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 This measures about 7 x 20 mm. Vascular calcifications.  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 No evidence of acute intracranial hemorrhage. CT angiography is suggested for additional characterization. Chronic  atrophy and small vessel ischemic changes. 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   Electronically Signed   By: Lucienne Capers M.D.   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 Physical exam is unremarkable. Based on history and exam this is most consistent with a peripheral vestibular process. Will check MRI brain to rule out stroke and MRA to better characterize possible aneurysm. May require neurosurgical evaluation depending on MRA results   Jim Like, DO [facility] [phone]  If 7pm- 7am, please page neurology on call as listed in [place]. 12/16/2013, 12:52 AM

## 2013-12-16 NOTE — Progress Notes (Signed)
Subjective: Patient had no complaints. Vertigo has resolved. He's been out of bed several times ambulating with no return of symptoms of vertigo.  Objective: Current vital signs: BP 117/66 mmHg  Pulse 72  Temp(Src) 98.2 F (36.8 C) (Oral)  Resp 16  Ht 5\' 8"  (1.727 m)  Wt 73.074 kg (161 lb 1.6 oz)  BMI 24.50 kg/m2  SpO2 97%  Neurologic Exam: Patient was alert and in no acute distress. Mental status was normal. Speech was normal.  MRI showed no signs of an acute stroke. 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 There was no evidence of vertebrobasilar dissection. Right and left ICAs were occluded. Anterior circulation is provided by way of posterior communicating arteries and extra cranial circulation.  Medications: I have reviewed the patient's current medications.  Assessment/Plan: 73 year old man presenting with acute vertigo, most likely of peripheral labyrinth origin. He has no signs of an acute stroke. There were no focal deficits to indicate posterior circulation focal ischemia. Vertigo has resolved at this point.  No further neurodiagnostic studies are indicated regarding cerebrovascular changes described above, which are chronic and for which patient is asymptomatic. As well, a significant portion of patient's anterior circulation is derived from posterior circulation, and procedures involving vertebrobasilar system could potentially be very risky.  No further neurological intervention is indicated at this point. Recommend he continue taking aspirin 81 mg per day.  C.R. Nicole Kindred, MD [facility] [phone]  12/16/2013  12:01 PM

## 2013-12-16 NOTE — ED Provider Notes (Signed)
CSN: [id number]     Arrival date & time 12/15/13  2141 History   First MD Initiated Contact with Patient 12/15/13 2313     Chief Complaint  Patient presents with  . Dizziness  . Nausea     (Consider location/radiation/quality/duration/timing/severity/associated sxs/prior Treatment) HPI Patient presents with acute onset dizziness starting around 6:30 PM this evening after dinner. Describes the sensation as spinning. Associated nausea with vomiting 3. States he feels better when lying flat. No previously similar symptoms. Denies any vision changes. Denies headache, chest pain, shortness of breath, abdominal pain or diarrhea. Denies blood in vomit or in stool. No hearing changes, tinnitus or pain. No fever or chills. Patient denies voice change, focal weakness or numbness. Patient states he has been ambulatory since onset of symptoms. Past Medical History  Diagnosis Date  . Hypercholesterolemia   . Hypertension   . Macular degeneration   . Hx of radiation therapy 03/22/11 to 05/08/11    PSA recurrent carcinoma of prostate  . Heart murmur   . Peripheral vascular disease   . Stones in the urinary tract   . 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 Prostate cancer   . Arthritis   . Macular degeneration of both eyes   . Blind right eye   . Colon polyps 2004, 07, 14  . Cataract    Past Surgical History  Procedure Laterality Date  . Prostatectomy retropubic radical  07/15/2002    with nerve sparing, Gleason 3+4=7  . Penile prosthesis implant    . Penile prosthesis  removal    . Prostate biopsy    . Hernia repair      right side,lft  . Femoral-popliteal bypass graft  07/19/2011    Procedure: BYPASS GRAFT FEMORAL-POPLITEAL ARTERY;  Surgeon: Mal Misty, MD;  Location: [facility] OR;  Service: Vascular;  Laterality: Right;  Right Femoral - Popliteal  Bypss with saphenous vein  . Intraoperative arteriogram  07/19/2011    Procedure: INTRA OPERATIVE ARTERIOGRAM;  Surgeon: Mal Misty, MD;   Location: [place];  Service: Vascular;  Laterality: Right;  . Pr vein bypass graft,aorto-fem-pop  07/19/11    Right  Fem/Pop BPG  . Colonoscopy w/ biopsies      multiple   . Transthoracic echocardiogram  January    Normal EF and overall function  . Nm myoview ltd  June 2013    Normal EF, very small area of basal-mid inferior ischemia; LOW RISK  . Reclast  July 25, 2013   Family History  Problem Relation Age of Onset  . Prostate cancer Brother 23  . Colon cancer Neg Hx   . Aneurysm Father    History  Substance Use Topics  . Smoking status: Former Smoker -- 3.00 packs/day for 20 years    Types: Cigarettes    Quit date: 07/01/2002  . Smokeless tobacco: Never Used  . Alcohol Use: No    Review of Systems  Constitutional: Negative for fever, chills and fatigue.  HENT: Negative for ear discharge, ear pain, hearing loss and rhinorrhea.   Eyes: Negative for visual disturbance.  Respiratory: Negative for cough and shortness of breath.   Cardiovascular: Negative for chest pain, palpitations and leg swelling.  Gastrointestinal: Positive for nausea and vomiting. Negative for abdominal pain, diarrhea, constipation and blood in stool.  Genitourinary: Negative for dysuria.  Musculoskeletal: Negative for myalgias, back pain, neck pain and neck stiffness.  Skin: Negative for rash and wound.  Neurological: Positive for dizziness.  Negative for syncope, weakness, light-headedness, numbness and headaches.  All other systems reviewed and are negative.     Allergies  Avelox  Home Medications   Prior to Admission medications   Medication Sig Start Date End Date Taking? Authorizing Provider  acetaminophen (TYLENOL) 325 MG tablet Take 650 mg by mouth every 6 (six) hours as needed. For pain   Yes Historical Provider, MD  amLODipine (NORVASC) 10 MG tablet Take 10 mg by mouth daily.   Yes Historical Provider, MD  aspirin 81 MG tablet Take 81 mg by mouth daily.   Yes Historical Provider, MD   Calcium Carbonate-Vitamin D (CALTRATE 600+D PO) Take 1 tablet by mouth 2 (two) times daily.    Yes Historical Provider, MD  docusate sodium (COLACE) 100 MG capsule Take 100 mg by mouth at bedtime.   Yes Historical Provider, MD  latanoprost (XALATAN) 0.005 % ophthalmic solution daily. One drop Left eye every day 09/18/13  Yes Historical Provider, MD  losartan-hydrochlorothiazide (HYZAAR) 100-12.5 MG per tablet Take 1 tablet by mouth daily.   Yes Historical Provider, MD  LUTEIN PO Take 1 tablet by mouth daily.   Yes Historical Provider, MD  metoprolol succinate (TOPROL-XL) 25 MG 24 hr tablet Take 1 tablet (25 mg total) by mouth daily. 07/01/13  Yes Leonie Man, MD  Omega-3 Fatty Acids (FISH OIL) 1200 MG CAPS Take 3,600 mg by mouth daily.   Yes Historical Provider, MD  polyethylene glycol (MIRALAX / GLYCOLAX) packet Take 17 g by mouth at bedtime.   Yes Historical Provider, MD  rosuvastatin (CRESTOR) 10 MG tablet Take 10 mg by mouth daily.   Yes Historical Provider, MD  fluticasone (FLONASE) 50 MCG/ACT nasal spray Place 2 sprays into both nostrils as needed (for nasal congestion).  09/02/12   Historical Provider, MD  ibuprofen (ADVIL,MOTRIN) 200 MG tablet Take 200 mg by mouth every 6 (six) hours as needed. For pain    Historical Provider, MD   BP 137/77 mmHg  Pulse 66  Temp(Src) 97.8 F (36.6 C) (Oral)  Resp 20  Ht 5\' 8"  (1.727 m)  Wt 158 lb (71.668 kg)  BMI 24.03 kg/m2  SpO2 100% Physical Exam  Constitutional: He is oriented to person, place, and time. He appears well-developed and well-nourished. No distress.  HENT:  Head: Normocephalic and atraumatic.  Right Ear: External ear normal.  Left Ear: External ear normal.  Mouth/Throat: Oropharynx is clear and moist. No oropharyngeal exudate.  Eyes: EOM are normal. Pupils are equal, round, and reactive to light.  Fatigable rotary nystagmus  Neck: Normal range of motion. Neck supple.  Cardiovascular: Normal rate and regular rhythm.    Pulmonary/Chest: Effort normal and breath sounds normal. No respiratory distress. He has no wheezes. He has no rales. He exhibits no tenderness.  Abdominal: Soft. Bowel sounds are normal. He exhibits no distension and no mass. There is no tenderness. There is no rebound and no guarding.  Musculoskeletal: Normal range of motion. He exhibits no edema or tenderness.  Neurological: He is alert and oriented to person, place, and time.  Patient is alert and oriented x3 with clear, goal oriented speech. Patient has 5/5 motor in all extremities. Sensation is intact to light touch. Bilateral finger-to-nose is normal with no signs of dysmetria.   Skin: Skin is warm and dry. No rash noted. No erythema.  Psychiatric: He has a normal mood and affect. His behavior is normal.  Nursing note and vitals reviewed.   ED Course  Procedures (including critical  care time) Labs Review Labs Reviewed  CBC - Abnormal; Notable for the following:    RBC 3.88 (*)    Hemoglobin 12.6 (*)    HCT 35.3 (*)    All other components within normal limits  COMPREHENSIVE METABOLIC PANEL - Abnormal; Notable for the following:    Glucose, Bld 103 (*)    BUN 30 (*)    GFR calc non Af Amer 53 (*)    GFR calc Af Amer 61 (*)    All other components within normal limits  PROTIME-INR  APTT  DIFFERENTIAL  CBG MONITORING, ED  I-STAT TROPOININ, ED    Imaging Review Ct Head (brain) Wo Contrast  12/15/2013   CLINICAL DATA:  Dizziness and vomiting. Dizziness on standing. History of prostate cancer.  EXAM: CT HEAD WITHOUT CONTRAST  TECHNIQUE: Contiguous axial images were obtained from the base of the skull through the vertex without intravenous contrast.  COMPARISON:  None.  FINDINGS: Mild diffuse cerebral atrophy. Patchy low-attenuation changes in the deep white matter consistent with small vessel ischemia. 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 No mass effect or midline shift. No abnormal extra-axial fluid collections.  Gray-white matter junctions are distinct. Basal cisterns are not effaced. No evidence of acute intracranial hemorrhage. No depressed skull fractures. Opacification of some of the left ethmoid air cells. Mastoid air cells are not opacified. 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 This measures about 7 x 20 mm. Vascular calcifications.  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 No evidence of acute intracranial hemorrhage. CT angiography is suggested for additional characterization. Chronic atrophy and small vessel ischemic changes. 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   Electronically Signed   By: Lucienne Capers M.D.   On: 12/15/2013 22:57     EKG Interpretation None      Date: 12/16/2013  Rate: 62  Rhythm: normal sinus rhythm  QRS Axis: normal  Intervals: normal  ST/T Wave abnormalities: normal  Conduction Disutrbances:none  Narrative Interpretation:   Old EKG Reviewed: none available   MDM   Final diagnoses:  None    Discussed with neurology. Will evaluate the patient in the emergency department.  Neurology recommends admission for nonemergent MRI/MRA. Discussed with [facility]. Will admit to telemetry bed.  Julianne Rice, MD 12/16/13 [phone]

## 2013-12-16 NOTE — Plan of Care (Signed)
Problem: Phase I Progression Outcomes Goal: Pain controlled with appropriate interventions Outcome: Completed/Met Date Met:  12/16/13 Goal: OOB as tolerated unless otherwise ordered Outcome: Completed/Met Date Met:  12/16/13 Goal: Initial discharge plan identified Outcome: Completed/Met Date Met:  12/16/13 Goal: Voiding-avoid urinary catheter unless indicated Outcome: Completed/Met Date Met:  12/16/13 Goal: Hemodynamically stable Outcome: Completed/Met Date Met:  12/16/13 Goal: Other Phase I Outcomes/Goals Outcome: Completed/Met Date Met:  12/16/13  Problem: Phase II Progression Outcomes Goal: Progress activity as tolerated unless otherwise ordered Outcome: Completed/Met Date Met:  12/16/13 Goal: Discharge plan established Outcome: Completed/Met Date Met:  12/16/13 Goal: Vital signs remain stable Outcome: Completed/Met Date Met:  12/16/13

## 2013-12-19 ENCOUNTER — Telehealth: Payer: Self-pay | Admitting: *Deleted

## 2013-12-19 NOTE — Telephone Encounter (Signed)
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 I would recommend referral to Dr. Quay Burow to discuss the possibility of renal artery angiography plus or minus stent placement.  The main reason for not doing so would be that blood pressure is well controlled.  The concern however is that the left kidney itself seems to have become smaller in size suggesting that it is not getting enough blood flow.  Leonie Man, MD

## 2013-12-19 NOTE — Telephone Encounter (Signed)
Spoke to patient. Result given . Verbalized understanding Patient is aware an appointment is needed with Dr Gwenlyn Found. To discuss possible renal artery angiography

## 2013-12-22 DIAGNOSIS — I671 Cerebral aneurysm, nonruptured: Secondary | ICD-10-CM | POA: Insufficient documentation

## 2013-12-23 NOTE — Telephone Encounter (Signed)
Patient scheduled to see Dr Gwenlyn Found 11/30

## 2013-12-29 ENCOUNTER — Encounter: Payer: Self-pay | Admitting: Cardiovascular Disease

## 2013-12-29 ENCOUNTER — Ambulatory Visit (INDEPENDENT_AMBULATORY_CARE_PROVIDER_SITE_OTHER): Payer: Medicare Other | Admitting: Cardiovascular Disease

## 2013-12-29 VITALS — BP 136/68 | HR 88 | Ht 68.0 in | Wt 168.0 lb

## 2013-12-29 DIAGNOSIS — I701 Atherosclerosis of renal artery: Secondary | ICD-10-CM | POA: Diagnosis not present

## 2013-12-29 NOTE — Progress Notes (Signed)
12/29/2013 Lance Dillon   14-Dec-1940  [id number]  Primary Physician Thressa Sheller, MD Primary Cardiologist: Lorretta Harp MD Renae Gloss   HPI:  Lance Dillon is a very pleasant 73 year old married Caucasian male formally a patient of Dr. Georgiann Mccoy and currently of Dr. Shanon Brow Harding's. He has no prior cardiac history. He does have a history of hypertension and hyperlipidemia. He had angiography performed by Dr. Trula Slade 06/20/11 the setting of right lower extremely claudication. At the time of angiography he had demonstrated 70% left renal artery stenosis. He ultimately underwent right femoropopliteal bypass grafting by Dr. Kellie Simmering. Following his renal Doppler studies semiannually which have demonstrated a mild progressive decline in renal dimensions as well as increase in velocities. His left renal dimension is still 10.9 cm. 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 I'm somewhat hesitant to put him on Plavix in the setting discuss this with the Neuro  hospitalist saw him in consultation.   Current Outpatient Prescriptions  Medication Sig Dispense Refill  . acetaminophen (TYLENOL) 325 MG tablet Take 650 mg by mouth every 6 (six) hours as needed. For pain    . amLODipine (NORVASC) 10 MG tablet Take 10 mg by mouth daily.    Marland Kitchen aspirin 81 MG tablet Take 81 mg by mouth daily.    . Calcium Carbonate-Vitamin D (CALTRATE 600+D PO) Take 1 tablet by mouth 2 (two) times daily.     Marland Kitchen docusate sodium (COLACE) 100 MG capsule Take 100 mg by mouth at bedtime.    . fluticasone (FLONASE) 50 MCG/ACT nasal spray Place 2 sprays into both nostrils as needed (for nasal congestion).     Marland Kitchen ibuprofen (ADVIL,MOTRIN) 200 MG tablet Take 200 mg by mouth every 6 (six) hours as needed. For pain    . latanoprost (XALATAN) 0.005 % ophthalmic solution daily. One drop  Left eye every day    . losartan-hydrochlorothiazide (HYZAAR) 100-12.5 MG per tablet Take 1 tablet by mouth daily.    . LUTEIN PO Take 1 tablet by mouth daily.    . metoprolol succinate (TOPROL-XL) 25 MG 24 hr tablet Take 1 tablet (25 mg total) by mouth daily. 30 tablet 0  . Omega-3 Fatty Acids (FISH OIL) 1200 MG CAPS Take 3,600 mg by mouth daily.    . polyethylene glycol (MIRALAX / GLYCOLAX) packet Take 17 g by mouth at bedtime.    . rosuvastatin (CRESTOR) 10 MG tablet Take 10 mg by mouth daily.     No current facility-administered medications for this visit.    Allergies  Allergen Reactions  . Avelox [Moxifloxacin Hcl In Nacl]     diarrhea    History   Social History  . Marital Status: Married    Spouse Name: N/A    Number of Children: N/A  . Years of Education: N/A   Occupational History  . Boiler Tech    Social History Main Topics  . Smoking status: Former Smoker -- 3.00 packs/day for 20 years    Types: Cigarettes    Quit date: 07/01/2002  . Smokeless tobacco: Never Used  . Alcohol Use: No  . Drug Use: No  . Sexual Activity: Not on file   Other Topics Concern  . 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  Usually walks 2 miles maybe 2-3 days a week. Just not a cold weather.   He is a former smoker, quit "cold [place] "after 3 pack per day for 60 pack years in 2004     Review of Systems: General: negative for chills, fever, night sweats or weight changes.  Cardiovascular: negative for chest pain, dyspnea on exertion, edema, orthopnea, palpitations, paroxysmal nocturnal dyspnea or shortness of breath Dermatological: negative for rash Respiratory: negative for cough or wheezing Urologic: negative for hematuria Abdominal: negative for nausea, vomiting, diarrhea, bright red blood per rectum, melena, or hematemesis Neurologic: negative for visual  changes, syncope, or dizziness All other systems reviewed and are otherwise negative except as noted above.    Blood pressure 136/68, pulse 88, height 5\' 8"  (1.727 m), weight 168 lb (76.204 kg).  General appearance: alert and no distress Neck: no adenopathy, no carotid bruit, no JVD, supple, symmetrical, trachea midline and thyroid not enlarged, symmetric, no tenderness/mass/nodules Lungs: clear to auscultation bilaterally Heart: regular rate and rhythm, S1, S2 normal, no murmur, click, rub or gallop Extremities: extremities normal, atraumatic, no cyanosis or edema  EKG not performed today  ASSESSMENT AND PLAN:   Left renal artery stenosis - 70% by angiography; increased velocity on recent Dopplers (60-99%) History of left renal artery stenosis demonstrated by angiography performed by Dr. Trula Slade 06/20/11.he has hypertension as well. We have been following his renal arteries by difficult ultrasound which over the last 18 months has shown progressive decrease in left the reduction in size but 0.7 cm. His systolic velocities and renal aortic ratios have also suggested worsening of this stenosis. His blood pressure is under good control and his renal function is stable. 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 At this point the indication for intervention on his left renal artery would be renal preservation Concerned about having him on antiplatelet with his posterior circulation aneurysm. I will discuss this with one of the neurologists prior to making a decision. In any event, that this we have decided to repeat renal Doppler studies in 3 months and I will see him back after that further evaluation.      Lorretta Harp MD FACP,FACC,FAHA, [facility] 12/29/2013 2:34 PM

## 2013-12-29 NOTE — Patient Instructions (Signed)
You need to make an appointment with your primary cardiologist Dr. Roni Bread at his NEXT AVAILABLE appointment.  (In 3 months) Renal Artery Doppler- During this test, an ultrasound is used to evaluate blood flow to the kidneys. Allow one hour for this exam. Do not eat after midnight the day before and avoid carbonated beverages. Take your medications as you usually do.  Your physician wants you to follow-up in 1 year with Dr. Gwenlyn Found. You will receive a reminder letter in the mail 2 months in advance. If you do not receive a letter, please call our office to schedule the follow-up appointment.

## 2013-12-29 NOTE — Assessment & Plan Note (Addendum)
History of left renal artery stenosis demonstrated by angiography performed by Dr. Trula Slade 06/20/11.he has hypertension as well. We have been following his renal arteries by difficult ultrasound which over the last 18 months has shown progressive decrease in left the reduction in size but 0.7 cm. His systolic velocities and renal aortic ratios have also suggested worsening of this stenosis. His blood pressure is under good control and his renal function is stable. 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 At this point the indication for intervention on his left renal artery would be renal preservation Concerned about having him on antiplatelet with his posterior circulation aneurysm. I will discuss this with one of the neurologists prior to making a decision. In any event, that this we have decided to repeat renal Doppler studies in 3 months and I will see him back after that further evaluation.  NB I spoke to Dr. Leonie Man about the risk of putting the patient on Plavix in addition to aspirin with his basilar artery aneurysm and he felt that this was low risk with regards to rupture and would not the adverse to short-term dual antibiotic therapy should we decide to proceed with renal artery stenting.

## 2014-01-08 ENCOUNTER — Encounter (HOSPITAL_COMMUNITY): Payer: Self-pay | Admitting: Surgery

## 2014-02-13 ENCOUNTER — Ambulatory Visit: Payer: Medicare Other | Admitting: Cardiovascular Disease

## 2014-02-18 ENCOUNTER — Ambulatory Visit (INDEPENDENT_AMBULATORY_CARE_PROVIDER_SITE_OTHER): Payer: Medicare Other | Admitting: Cardiology

## 2014-02-18 ENCOUNTER — Encounter: Payer: Self-pay | Admitting: Cardiology

## 2014-02-18 VITALS — BP 104/60 | HR 88 | Ht 68.0 in | Wt 167.2 lb

## 2014-02-18 DIAGNOSIS — E785 Hyperlipidemia, unspecified: Secondary | ICD-10-CM

## 2014-02-18 DIAGNOSIS — I671 Cerebral aneurysm, nonruptured: Secondary | ICD-10-CM

## 2014-02-18 DIAGNOSIS — I739 Peripheral vascular disease, unspecified: Secondary | ICD-10-CM

## 2014-02-18 DIAGNOSIS — I10 Essential (primary) hypertension: Secondary | ICD-10-CM

## 2014-02-18 DIAGNOSIS — I701 Atherosclerosis of renal artery: Secondary | ICD-10-CM

## 2014-02-18 NOTE — Patient Instructions (Addendum)
No changes with medications  Your physician wants you to follow-up in 12 months Dr Glenetta Hew.  You will receive a reminder letter in the mail two months in advance. If you don't receive a letter, please call our office to schedule the follow-up appointment. ( patient does not need to see both doctors Gwenlyn Found or [place] in same month)

## 2014-02-20 ENCOUNTER — Encounter: Payer: Self-pay | Admitting: Cardiology

## 2014-02-20 DIAGNOSIS — I671 Cerebral aneurysm, nonruptured: Secondary | ICD-10-CM | POA: Insufficient documentation

## 2014-02-20 NOTE — Assessment & Plan Note (Addendum)
Stable claudication. Followed by surgery (Dr. Kellie Simmering)

## 2014-02-20 NOTE — Progress Notes (Signed)
PATIENT: Lance Dillon MRN: [id number]  DOB: Jul 24, 1940   DOV:02/20/2014 PCP: Thressa Sheller, MD  Clinic Note: Chief Complaint  Patient presents with  . Follow-up    eps. of dizziness in December due to possible Vertigo   HPI: Lance Dillon is a 74 y.o. male with a PMH below who presents today for ~1 year followup. He is a former patient Dr. Rollene Fare. He was last seen in January of this year. He has known PAD (with left renal artery stenosis of 70%, status post right femoropopliteal bypass graft in June of 2013) as well as a strong family history of CAD. To date, his cardiac evaluation has been benign. He was actually seen by Dr. Rollene Fare for the first time as part of preoperative evaluation for his vascular surgery.  His carotid disease being followed Dr. Kellie Simmering. He saw Dr. Gwenlyn Found in November 2015 after Renal Dopplers indicated more significant RAS.  At that time retrograde was best to continue expectant/conservative management because he was concerned about a posterior circulation aneurysm noted on a CT scan of the head during evaluation for near syncope. Based on discussions with neurology, they would be fine with the patient being on antiplatelet agents.  Interval History: He returns today continuing to be free of any significant cardiac symptoms. He denies any chest tightness or pressure at rest or exertion. No dyspnea at rest or exertion. He does note some mild claudication symptoms in his right leg where he says "it tightens up "with walking. He does say that if he keeps on walking to it it seemed to get better.  An episode of dizziness as a for Christmas. During his evaluation was performed and was thought to be related to vertigo.he does note intermittent claudication symptoms that are not yet limiting, but more limiting than his dyspnea would be.  The remainder of Cardiovascular ROS: no chest pain or dyspnea on exertion negative for - edema, irregular heartbeat, loss of consciousness,  murmur, orthopnea, palpitations, paroxysmal nocturnal dyspnea, rapid heart rate or shortness of breath, syncope/near-syncope, or  TIA/amaurosis fugax   Past Medical History  Diagnosis Date  . Hypercholesterolemia   . Hypertension   . Macular degeneration   . Hx of radiation therapy 03/22/11 to 05/08/11    PSA recurrent carcinoma of prostate  . Heart murmur     No evidence of valvular lesion noted on Echo 01/2010  . Peripheral vascular disease   . Stones in the urinary tract   . 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 Prostate cancer   . Arthritis   . Macular degeneration of both eyes   . Blind right eye   . Colon polyps 2004, 07, 14  . Cataract     Prior Cardiac Evaluation and Past Surgical History: Procedure Laterality Date  . Prostatectomy retropubic radical  07/15/2002    with nerve sparing, Gleason 3+4=7  . Femoral-popliteal bypass graft  07/19/2011    Procedure: BYPASS GRAFT FEMORAL-POPLITEAL ARTERY;  Surgeon: Mal Misty, MD;  Location: [facility] OR;  Service: Vascular;  Laterality: Right;  Right Femoral - Popliteal  Bypss with saphenous vein  . Intraoperative arteriogram  07/19/2011    Procedure: INTRA OPERATIVE ARTERIOGRAM;  Surgeon: Mal Misty, MD;  Location: [place];  Service: Vascular;  Laterality: Right;  . Pr vein bypass graft,aorto-fem-pop  07/19/11    Right  Fem/Pop BPG    Normal EF and overall function  . Nm myoview ltd  June 2013  Normal EF, very small area of basal-mid inferior ischemia; LOW RISK  . Abdominal aortagram N/A 06/20/2011    Procedure: ABDOMINAL Maxcine Ham;  Surgeon: Serafina Mitchell, MD;  Location: [facility] CATH LAB;  Service: Cardiovascular;  Laterality: N/A;  . Renal duplex  07/01/2012    Left renal artery 60-99% diameter reduction  . Cardiovascular stress test  07/06/2011    Evidence of mild ischemia in Basal Inferior and Mid Inferior regions, no significant wall motion abnormalities noted.  . Doppler echocardiography  02/23/2010    EF 123456, lv systolic function  normal    Allergies  Allergen Reactions  . Avelox [Moxifloxacin Hcl In Nacl]     diarrhea    Current Outpatient Prescriptions  Medication Sig Dispense Refill  . acetaminophen (TYLENOL) 325 MG tablet Take 650 mg by mouth every 6 (six) hours as needed. For pain    . amLODipine (NORVASC) 10 MG tablet Take 10 mg by mouth daily.    Marland Kitchen aspirin 81 MG tablet Take 81 mg by mouth daily.    . Calcium Carbonate-Vitamin D (CALTRATE 600+D PO) Take 1 tablet by mouth 2 (two) times daily.     Marland Kitchen docusate sodium (COLACE) 100 MG capsule Take 100 mg by mouth at bedtime.    . fluticasone (FLONASE) 50 MCG/ACT nasal spray Place 2 sprays into both nostrils as needed (for nasal congestion).     Marland Kitchen ibuprofen (ADVIL,MOTRIN) 200 MG tablet Take 200 mg by mouth every 6 (six) hours as needed. For pain    . latanoprost (XALATAN) 0.005 % ophthalmic solution daily. One drop Left eye every day    . losartan-hydrochlorothiazide (HYZAAR) 100-12.5 MG per tablet Take 1 tablet by mouth daily.    . LUTEIN PO Take 1 tablet by mouth daily.    . metoprolol succinate (TOPROL-XL) 25 MG 24 hr tablet Take 1 tablet (25 mg total) by mouth daily. 30 tablet 0  . Omega-3 Fatty Acids (FISH OIL) 1200 MG CAPS Take 3,600 mg by mouth daily.    . polyethylene glycol (MIRALAX / GLYCOLAX) packet Take 17 g by mouth at bedtime.    . rosuvastatin (CRESTOR) 10 MG tablet Take 10 mg by mouth daily.     No current facility-administered medications for this visit.    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   Usually walks 2 miles maybe 2-3 days a week. Just not a cold weather.   He is a former smoker, quit "cold [place] "after 3 pack per day for 60 pack years in 2004   ROS: A comprehensive Review of Systems - Negative except Mild/nonunion claudication symptoms the notes above.he also has worsening eyesight secondary to macular  degeneration  PHYSICAL EXAM BP 104/60 mmHg  Pulse 88  Ht 5\' 8"  (1.727 m)  Wt 167 lb 3.2 oz (75.841 kg)  BMI 25.43 kg/m2 General appearance: alert, cooperative, appears stated age, no distress and Pleasant mood and affect. Answers questions appropriately. Well-nourished well-groomed. Neck: no adenopathy, no carotid bruit, no JVD and supple, symmetrical, trachea midline Lungs:CTAB, normal percussion bilaterally and Nonlabored, good air movement Heart:RRR, S1&S2 normal and Possible soft S4, 2/6 systolic murmur at LUSB Abdomen: soft, non-tender; bowel sounds normal; no masses,  no organomegaly Extremities: extremities normal, atraumatic, no cyanosis or edema Pulses: Weak/faint DP and PT of the left - trace to 1+. Also faint DP and PT on the right. No femoral bruit. Neurologic:  Grossly normal HEENT: [place]/AT, EOMI, MMM, anicteric sclera  [id number] today: Yes Rate:68 , Rhythm: NSR-Sinus Arrhythmia; Otherwise normal EKG.  Recent Labs: Not currently - due to be checked by PCP soon.  ASSESSMENT / PLAN: Left renal artery stenosis - 70% by angiography; increased velocity on recent Dopplers (60-99%) Plan per Dr. Gwenlyn Found was to repeat Doppler ultrasounds of the renal arteries in the next month or so to determine if he would potentially need to have intervention. Dr. Gwenlyn Found did discuss with the neurology stroke team and that but it would be acceptable to place him on antiplatelet agents would be posterior circulation aneurysm.   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 Somewhat concerning as far as knowing how to best treat his other conditions. For now would continue risk factor modification including hypertension reduction. Less aggressive treatment as far as any anticoagulation. We'll refer her back to Dr. Kellie Simmering to assess.   Essential hypertension Well-controlled on current regimen   Hyperlipidemia LDL goal <100 On statin and omega-3 fatty acids -- followed by  PCP   Peripheral vascular disease c disease, bypass surgery (Dr. Kellie Simmering)     No orders of the defined types were placed in this encounter.   No orders of the defined types were placed in this encounter.    Followup: One year  DAVID W. Ellyn Hack, M.D., M.S. [facility] [address] [place], [place]  [address]  [phone] Pager # [phone]

## 2014-02-20 NOTE — Assessment & Plan Note (Signed)
Somewhat concerning as far as knowing how to best treat his other conditions. For now would continue risk factor modification including hypertension reduction. Less aggressive treatment as far as any anticoagulation. We'll refer her back to Dr. Kellie Simmering to assess.

## 2014-02-20 NOTE — Assessment & Plan Note (Signed)
On statin and omega-3 fatty acids -- followed by PCP

## 2014-02-20 NOTE — Assessment & Plan Note (Signed)
Well-controlled on current regimen. ?

## 2014-02-20 NOTE — Assessment & Plan Note (Signed)
Plan per Dr. Gwenlyn Found was to repeat Doppler ultrasounds of the renal arteries in the next month or so to determine if he would potentially need to have intervention. Dr. Gwenlyn Found did discuss with the neurology stroke team and that but it would be acceptable to place him on antiplatelet agents would be posterior circulation aneurysm.

## 2014-03-18 ENCOUNTER — Ambulatory Visit (HOSPITAL_COMMUNITY)
Admission: RE | Admit: 2014-03-18 | Discharge: 2014-03-18 | Disposition: A | Discharge status: Home / Self Care | Payer: Medicare Other | Source: Ambulatory Visit | Attending: Cardiovascular Disease | Admitting: Cardiovascular Disease

## 2014-03-18 DIAGNOSIS — I701 Atherosclerosis of renal artery: Secondary | ICD-10-CM | POA: Diagnosis not present

## 2014-03-18 NOTE — Progress Notes (Signed)
Renal Duplex Completed.  Demonstrated a known 60-99% LRA stenosis. Oda Cogan, BS, RDMS, RVT'

## 2014-04-02 ENCOUNTER — Telehealth: Payer: Self-pay | Admitting: *Deleted

## 2014-04-02 NOTE — Telephone Encounter (Signed)
Spoke to patient. Result given . Verbalized understanding  

## 2014-04-02 NOTE — Telephone Encounter (Signed)
-----   Message from Leonie Man, MD sent at 03/30/2014  7:26 PM EST ----- Stable Renal Artery stenosis.    Follow-up as scheduled.  [place]

## 2014-07-29 ENCOUNTER — Other Ambulatory Visit: Payer: Self-pay | Admitting: Rheumatology

## 2014-07-29 DIAGNOSIS — M25551 Pain in right hip: Secondary | ICD-10-CM

## 2014-08-31 HISTORY — PX: EYE SURGERY: SHX253

## 2014-09-28 ENCOUNTER — Encounter: Payer: Self-pay | Admitting: Family

## 2014-09-29 ENCOUNTER — Ambulatory Visit (INDEPENDENT_AMBULATORY_CARE_PROVIDER_SITE_OTHER): Payer: Medicare Other | Admitting: Family

## 2014-09-29 ENCOUNTER — Other Ambulatory Visit: Payer: Self-pay | Admitting: Family

## 2014-09-29 ENCOUNTER — Ambulatory Visit (INDEPENDENT_AMBULATORY_CARE_PROVIDER_SITE_OTHER)
Admission: RE | Admit: 2014-09-29 | Discharge: 2014-09-29 | Disposition: A | Discharge status: Home / Self Care | Payer: Medicare Other | Source: Ambulatory Visit | Attending: Family | Admitting: Family

## 2014-09-29 ENCOUNTER — Ambulatory Visit (HOSPITAL_COMMUNITY)
Admission: RE | Admit: 2014-09-29 | Discharge: 2014-09-29 | Disposition: A | Discharge status: Home / Self Care | Payer: Medicare Other | Source: Ambulatory Visit | Attending: Family | Admitting: Family

## 2014-09-29 ENCOUNTER — Encounter: Payer: Self-pay | Admitting: Family

## 2014-09-29 VITALS — BP 128/79 | HR 63 | Temp 97.7°F | Resp 16 | Ht 68.0 in | Wt 159.0 lb

## 2014-09-29 DIAGNOSIS — Z87891 Personal history of nicotine dependence: Secondary | ICD-10-CM

## 2014-09-29 DIAGNOSIS — I739 Peripheral vascular disease, unspecified: Secondary | ICD-10-CM | POA: Insufficient documentation

## 2014-09-29 DIAGNOSIS — Z9889 Other specified postprocedural states: Secondary | ICD-10-CM | POA: Diagnosis not present

## 2014-09-29 DIAGNOSIS — Z95828 Presence of other vascular implants and grafts: Secondary | ICD-10-CM

## 2014-09-29 DIAGNOSIS — I10 Essential (primary) hypertension: Secondary | ICD-10-CM | POA: Diagnosis not present

## 2014-09-29 DIAGNOSIS — I779 Disorder of arteries and arterioles, unspecified: Secondary | ICD-10-CM

## 2014-09-29 DIAGNOSIS — E780 Pure hypercholesterolemia: Secondary | ICD-10-CM | POA: Insufficient documentation

## 2014-09-29 NOTE — Patient Instructions (Signed)

## 2014-09-29 NOTE — Progress Notes (Signed)
[facility] [place] HISTORY AND PHYSICAL -PAD  History of Present Illness Lance Dillon is a 74 y.o. male patient of Dr. Kellie Simmering who is s/p right common femoral to below knee popliteal artery bypass graft 07/19/2011 with right external iliac and common femoral artery endarterectomies. He returns today for follow up. He has less claudication symptoms than he had before revascularization. After walking about 1/4 mile both calves are moderately painful, relieved with rest. He walks a couple of miles 2-3 days/week. He has had injections in both hips which helped his pain. 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 Pt denies non healing wounds. Pt denies any history of stroke or TIA. His cardiologist, Dr. Ellyn Hack, checks his renal artery stenosis.  The patient reports New Medical or Surgical History: cataract removed from right eye. Lumbar spine injection. 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 Wife states she will ask pt's PCP who to follow up with re this.  Pt Diabetic: No Pt smoker: former smoker, quit in 2004  Pt meds include: Statin :Yes Betablocker: Yes ASA: Yes Other anticoagulants/antiplatelets: no     Past Medical History  Diagnosis Date  . Hypercholesterolemia   . Hypertension   . Macular degeneration   . Hx of radiation therapy 03/22/11 to 05/08/11    PSA recurrent carcinoma of prostate  . Heart murmur     No evidence of valvular lesion noted on Echo 01/2010  . Peripheral vascular disease   . Stones in the urinary tract   . 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 Arthritis   . Macular degeneration of both eyes   . Blind right eye   . Colon polyps 2004, 07, 14  . Cataract   . Prostate cancer 2014    Social History Social History  Substance Use Topics  . Smoking status: Former Smoker -- 3.00  packs/day for 20 years    Types: Cigarettes    Quit date: 07/01/2002  . Smokeless tobacco: Never Used  . Alcohol Use: No    Family History Family History  Problem Relation Age of Onset  . Prostate cancer Brother 47  . Heart disease Brother     Before age 65  . Hypertension Brother   . Heart attack Brother   . Colon cancer Neg Hx   . Aneurysm Father   . Heart disease Father   . Hypertension Father   . Heart disease Mother     Before age 54  . Hypertension Mother   . Heart attack Mother   . Heart disease Sister   . Hypertension Sister   . Heart attack Sister     Past Surgical History  Procedure Laterality Date  . Prostatectomy retropubic radical  07/15/2002    with nerve sparing, Gleason 3+4=7  . Penile prosthesis implant    . Penile prosthesis  removal    . Prostate biopsy    . Hernia repair      right side,lft  . Femoral-popliteal bypass graft  07/19/2011    Procedure: BYPASS GRAFT FEMORAL-POPLITEAL ARTERY;  Surgeon: Mal Misty, MD;  Location: [facility] OR;  Service: Vascular;  Laterality: Right;  Right Femoral - Popliteal  Bypss with saphenous vein  . Intraoperative arteriogram  07/19/2011    Procedure: INTRA OPERATIVE ARTERIOGRAM;  Surgeon: Mal Misty, MD;  Location: [place];  Service: Vascular;  Laterality: Right;  . Pr vein bypass graft,aorto-fem-pop  07/19/11    Right  Fem/Pop BPG  . Colonoscopy w/ biopsies      multiple   . Transthoracic echocardiogram  January    Normal EF and overall function  . Nm myoview ltd  June 2013    Normal EF, very small area of basal-mid inferior ischemia; LOW RISK  . Reclast  July 25, 2013  . Abdominal aortagram N/A 06/20/2011    Procedure: ABDOMINAL Maxcine Ham;  Surgeon: Serafina Mitchell, MD;  Location: [facility] CATH LAB;  Service: Cardiovascular;  Laterality: N/A;  . Renal duplex  07/01/2012    Left renal artery 60-99% diameter reduction  . Cardiovascular stress test  07/06/2011    Evidence of mild ischemia in Basal Inferior and Mid Inferior  regions, no significant wall motion abnormalities noted.  . Transthoracic echocardiogram  02/23/2010    EF 123456, lv systolic function normal  . Eye surgery Right Aug. 2016    Cataract    Allergies  Allergen Reactions  . Avelox [Moxifloxacin Hcl In Nacl] Diarrhea    diarrhea    Current Outpatient Prescriptions  Medication Sig Dispense Refill  . acetaminophen (TYLENOL) 325 MG tablet Take 650 mg by mouth every 6 (six) hours as needed. For pain    . amLODipine (NORVASC) 10 MG tablet Take 10 mg by mouth daily.    Marland Kitchen aspirin 81 MG tablet Take 81 mg by mouth daily.    . Calcium Carbonate-Vitamin D (CALTRATE 600+D PO) Take 1 tablet by mouth 2 (two) times daily.     Marland Kitchen docusate sodium (COLACE) 100 MG capsule Take 100 mg by mouth at bedtime.    . fluticasone (FLONASE) 50 MCG/ACT nasal spray Place 2 sprays into both nostrils as needed (for nasal congestion).     Marland Kitchen ibuprofen (ADVIL,MOTRIN) 200 MG tablet Take 200 mg by mouth every 6 (six) hours as needed. For pain    . ketorolac (ACULAR) 0.4 % SOLN   0  . latanoprost (XALATAN) 0.005 % ophthalmic solution daily. One drop Left eye every day    . losartan-hydrochlorothiazide (HYZAAR) 100-12.5 MG per tablet Take 1 tablet by mouth daily.    . LUTEIN PO Take 1 tablet by mouth daily.    . metoprolol succinate (TOPROL-XL) 25 MG 24 hr tablet Take 1 tablet (25 mg total) by mouth daily. 30 tablet 0  . Omega-3 Fatty Acids (FISH OIL) 1200 MG CAPS Take 3,600 mg by mouth daily.    . polyethylene glycol (MIRALAX / GLYCOLAX) packet Take 17 g by mouth at bedtime.    . rosuvastatin (CRESTOR) 10 MG tablet Take 10 mg by mouth daily.    Marland Kitchen tobramycin (TOBREX) 0.3 % ophthalmic solution   0   No current facility-administered medications for this visit.    ROS: See HPI for pertinent positives and negatives.   Physical Examination  Filed Vitals:   09/29/14 1018  BP: 128/79  Pulse: 63  Temp: 97.7 F (36.5 C)  TempSrc: Oral  Resp: 16  Height: 5\' 8"  (1.727 m)   Weight: 159 lb (72.122 kg)  SpO2: 98%   Body mass index is 24.18 kg/(m^2).  General: A&O x 3, WDWN. Gait: normal Eyes: PERRLA. Pulmonary: CTAB, without wheezes , rales or rhonchi. Cardiac: regular Rythm , without detected murmur.     Carotid Bruits Right Left   Negative Negative  Aorta is not palpable. Radial  pulses: are 2+ palpable and =   VASCULAR EXAM: Extremities without ischemic changes  without Gangrene; without open wounds. No edema. Toes are pink with brisk capillary refill.      LE Pulses Right Left   FEMORAL 3+ palpable 1+ palpable    POPLITEAL not palpable  not palpable   POSTERIOR TIBIAL biphasic by Doppler   monophasic by Doppler    DORSALIS PEDIS  ANTERIOR TIBIAL monophasic by Doppler  monophasic by Doppler    Abdomen: soft, NT, no palapble masses. Skin: no rashes, no ulcers. Musculoskeletal: no muscle wasting or atrophy. Neurologic: A&O X 3; Appropriate Affect ; SENSATION: normal; MOTOR FUNCTION: moving all extremities equally, motor strength 5/5 throughout. Speech is fluent/normal. CN 2-12 intact.             Non-Invasive Vascular Imaging: DATE: 09/29/2014 LOWER EXTREMITY ARTERIAL DUPLEX EVALUATION    INDICATION: Right femoropopliteal arterial bypass graft     PREVIOUS INTERVENTION(S): Right femoropopliteal arterial bypass graft placed 07/09/2011 Previous 4 duplex exams have reported likely occlusion of graft.    DUPLEX EXAM:     RIGHT  LEFT   Peak Systolic Velocity (cm/s) Ratio (if abnormal) Waveform  Peak Systolic Velocity (cm/s) Ratio (if abnormal) Waveform  56  T Inflow Artery     NV   Proximal Anastomosis     NV   Proximal Graft     NV   Mid Graft     NV    Distal Graft     NV   Distal Anastomosis     15   Dampened Outflow Artery     0.67 Today's ABI / TBI 0.55  0.77 Previous ABI / TBI (10/18/2013 ) 0.75    Waveform:    M - Monophasic       B - Biphasic       T - Triphasic  If Ankle Brachial Index (ABI) or Toe Brachial Index (TBI) performed, please see complete report  ADDITIONAL FINDINGS:     IMPRESSION: 1. Inflow common femoral artery is triphasic. Dense calcific plaque prevents evaluation of the superficial femoral artery.  2. Right lower extremity bypass graft cannot be identified. 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     ASSESSMENT: FENN BAYS is a 74 y.o. male who is s/p right common femoral to below knee popliteal artery bypass graft 07/19/2011 with right external iliac and common femoral artery endarterectomies. He seem to have no claudication symptoms, the right hip pain improves with more walking. He walks a couple of miles 2-3 days/week. ABI's are slightly worse compared to a year ago, both legs with evidence of moderate arterial occlusive disease. He was a 3 PPD smoker for many years until 2004 when he quit. Fortunately he does not have DM.  I discussed with Dr. Kellie Simmering that the ultrasound technician finds that the last several arterial Duplex of the right leg indicate occlusion of the graft with development of collateral perfusion. Will therefore check only ABI's on his follow up visit.   He takes a daily ASA and a statin.   PLAN:  Continue graduated walking program.  Based on the patient's vascular studies and examination, pt will return to clinic in 1 year with ABI's.  I discussed in depth with the patient the nature of atherosclerosis, and emphasized the importance of maximal medical management including  strict control of blood pressure, blood glucose, and lipid levels, obtaining  regular exercise, and continued cessation of smoking.  The patient is aware that without maximal medical management the underlying atherosclerotic disease process will progress, limiting the benefit of any interventions.  The patient was given information about PAD including signs, symptoms, treatment, what symptoms should prompt the patient to seek immediate medical care, and risk reduction measures to take.  Clemon Chambers, RN, MSN, FNP-C [facility] Phone: [phone]  Clinic MD: Kellie Simmering  09/29/2014 10:35 AM

## 2014-10-19 ENCOUNTER — Other Ambulatory Visit: Payer: Self-pay | Admitting: *Deleted

## 2014-10-19 MED ORDER — METOPROLOL SUCCINATE ER 25 MG PO TB24: 25.0000 mg | ORAL_TABLET | Freq: Every day | ORAL | Status: DC

## 2014-10-21 ENCOUNTER — Other Ambulatory Visit: Payer: Self-pay | Admitting: *Deleted

## 2014-10-21 MED ORDER — METOPROLOL SUCCINATE ER 25 MG PO TB24: 25.0000 mg | ORAL_TABLET | Freq: Every day | ORAL | Status: DC

## 2014-11-13 ENCOUNTER — Other Ambulatory Visit: Payer: Self-pay | Admitting: Cardiology

## 2014-11-13 MED ORDER — METOPROLOL SUCCINATE ER 25 MG PO TB24: 25.0000 mg | ORAL_TABLET | Freq: Every day | ORAL | Status: DC

## 2015-02-19 ENCOUNTER — Ambulatory Visit (INDEPENDENT_AMBULATORY_CARE_PROVIDER_SITE_OTHER): Payer: Medicare Other | Admitting: Cardiovascular Disease

## 2015-02-19 ENCOUNTER — Encounter: Payer: Self-pay | Admitting: Cardiovascular Disease

## 2015-02-19 VITALS — BP 118/78 | HR 69 | Ht 68.0 in | Wt 168.1 lb

## 2015-02-19 DIAGNOSIS — I701 Atherosclerosis of renal artery: Secondary | ICD-10-CM | POA: Diagnosis not present

## 2015-02-19 MED ORDER — METOPROLOL SUCCINATE ER 25 MG PO TB24: 25.0000 mg | ORAL_TABLET | Freq: Every day | ORAL | Status: DC

## 2015-02-19 NOTE — Patient Instructions (Signed)
Medication Instructions:  Your physician recommends that you continue on your current medications as directed. Please refer to the Current Medication list given to you today.   Labwork: none  Testing/Procedures: Your physician has requested that you have a renal artery duplex. During this test, an ultrasound is used to evaluate blood flow to the kidneys. Allow one hour for this exam. Do not eat after midnight the day before and avoid carbonated beverages. Take your medications as you usually do.    Follow-Up: Your physician wants you to follow-up in: 12 months with Dr. Gwenlyn Found. You will receive a reminder letter in the mail two months in advance. If you don't receive a letter, please call our office to schedule the follow-up appointment.   Any Other Special Instructions Will Be Listed Below (If Applicable).     If you need a refill on your cardiac medications before your next appointment, please call your pharmacy.

## 2015-02-19 NOTE — Progress Notes (Signed)
02/19/2015 Lance Dillon   1941/01/14  [id number]  Primary Physician Thressa Sheller, MD Primary Cardiologist: Lorretta Harp MD Renae Gloss   HPI:  Mr. Askin is a very pleasant 75 year old married Caucasian male formally a patient of Dr. Georgiann Mccoy and currently of Dr. Shanon Brow Harding's. I last saw him in the office 12/29/13.He has no prior cardiac history. He does have a history of hypertension and hyperlipidemia. He had angiography performed by Dr. Trula Slade 06/20/11 the setting of right lower extremely claudication. At the time of angiography he had demonstrated 70% left renal artery stenosis. He ultimately underwent right femoropopliteal bypass grafting by Dr. Kellie Simmering. Following his renal Doppler studies semiannually which have demonstrated a mild progressive decline in renal dimensions as well as increase in velocities. His left renal dimension is still 10.9 cm. 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 His last renal Doppler studies performed 03/18/14 revealed a left renal aortic ratio of 8.45 with a left renal dimension that was stable. He denies chest pain or shortness of breath since I saw him last.   Current Outpatient Prescriptions  Medication Sig Dispense Refill  . acetaminophen (TYLENOL) 325 MG tablet Take 650 mg by mouth every 6 (six) hours as needed. For pain    . amLODipine (NORVASC) 10 MG tablet Take 10 mg by mouth daily.    Marland Kitchen aspirin 81 MG tablet Take 81 mg by mouth daily.    . Calcium Carbonate-Vitamin D (CALTRATE 600+D PO) Take 1 tablet by mouth 2 (two) times daily.     Marland Kitchen docusate sodium (COLACE) 100 MG capsule Take 100 mg by mouth at bedtime.    . fluticasone (FLONASE) 50 MCG/ACT nasal spray Place 2 sprays into both nostrils as needed (for nasal congestion).     Marland Kitchen ibuprofen (ADVIL,MOTRIN) 200 MG tablet Take 200 mg by  mouth every 6 (six) hours as needed. For pain    . ketorolac (ACULAR) 0.4 % SOLN   0  . latanoprost (XALATAN) 0.005 % ophthalmic solution daily. One drop Left eye every day    . losartan-hydrochlorothiazide (HYZAAR) 100-12.5 MG per tablet Take 1 tablet by mouth daily.    . LUTEIN PO Take 1 tablet by mouth daily.    . metoprolol succinate (TOPROL-XL) 25 MG 24 hr tablet Take 1 tablet (25 mg total) by mouth daily. 90 tablet 3  . Omega-3 Fatty Acids (FISH OIL) 1200 MG CAPS Take 3,600 mg by mouth daily.    . polyethylene glycol (MIRALAX / GLYCOLAX) packet Take 17 g by mouth at bedtime.    . rosuvastatin (CRESTOR) 10 MG tablet Take 10 mg by mouth daily.    Marland Kitchen tobramycin (TOBREX) 0.3 % ophthalmic solution Reported on 02/19/2015  0   No current facility-administered medications for this visit.    Allergies  Allergen Reactions  . Avelox [Moxifloxacin Hcl In Nacl] Diarrhea    diarrhea    Social History   Social History  . Marital Status: Married    Spouse Name: N/A  . Number of Children: N/A  . Years of Education: N/A   Occupational History  . Boiler Tech    Social History Main Topics  . Smoking status: Former Smoker -- 3.00 packs/day for 20 years    Types: Cigarettes    Quit date: 07/01/2002  . Smokeless tobacco: Never Used  . Alcohol Use: No  . Drug Use:  No  . Sexual Activity: Not on file   Other Topics Concern  . 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   Usually walks 2 miles maybe 2-3 days a week. Just not a cold weather.   He is a former smoker, quit "cold [place] "after 3 pack per day for 60 pack years in 2004     Review of Systems: General: negative for chills, fever, night sweats or weight changes.  Cardiovascular: negative for chest pain, dyspnea on exertion, edema, orthopnea, palpitations, paroxysmal nocturnal dyspnea or shortness of  breath Dermatological: negative for rash Respiratory: negative for cough or wheezing Urologic: negative for hematuria Abdominal: negative for nausea, vomiting, diarrhea, bright red blood per rectum, melena, or hematemesis Neurologic: negative for visual changes, syncope, or dizziness All other systems reviewed and are otherwise negative except as noted above.    Blood pressure 118/78, pulse 69, height 5\' 8"  (1.727 m), weight 168 lb 1.6 oz (76.25 kg).  General appearance: alert and no distress Neck: no adenopathy, no carotid bruit, no JVD, supple, symmetrical, trachea midline and thyroid not enlarged, symmetric, no tenderness/mass/nodules Lungs: clear to auscultation bilaterally Heart: regular rate and rhythm, S1, S2 normal, no murmur, click, rub or gallop Extremities: extremities normal, atraumatic, no cyanosis or edema  EKG normal sinus rhythm at 69 without ST or T-wave changes. I personally reviewed this EKG  ASSESSMENT AND PLAN:   Left renal artery stenosis - 70% by angiography; increased velocity on recent Dopplers (60-99%) Mr. Amesquita returns today for his annual follow-up. Follow his renal artery stenosis which was 70% angiographically on the left. Left Doppler performed 03/18/14 revealed a left renal aortic ratio of 8.45 with stable dimensions. His blood pressure is under good control and his renal function has been normal in the past. At this point I'm going to continue to follow him and get follow-up Doppler studies. If these show progression of disease and/or decrease in his renal dimension we will consider PTA and stent for renal preservation.      Lorretta Harp MD FACP,FACC,FAHA, [facility] 02/19/2015 11:55 AM

## 2015-02-19 NOTE — Assessment & Plan Note (Signed)
Mr. Romo returns today for his annual follow-up. Follow his renal artery stenosis which was 70% angiographically on the left. Left Doppler performed 03/18/14 revealed a left renal aortic ratio of 8.45 with stable dimensions. His blood pressure is under good control and his renal function has been normal in the past. At this point I'm going to continue to follow him and get follow-up Doppler studies. If these show progression of disease and/or decrease in his renal dimension we will consider PTA and stent for renal preservation.

## 2015-03-03 DIAGNOSIS — I700 Atherosclerosis of aorta: Secondary | ICD-10-CM | POA: Insufficient documentation

## 2015-03-03 DIAGNOSIS — I708 Atherosclerosis of other arteries: Secondary | ICD-10-CM | POA: Insufficient documentation

## 2015-03-03 HISTORY — DX: Atherosclerosis of aorta: I70.0

## 2015-03-25 ENCOUNTER — Encounter: Payer: Self-pay | Admitting: Cardiology

## 2015-03-25 ENCOUNTER — Ambulatory Visit (INDEPENDENT_AMBULATORY_CARE_PROVIDER_SITE_OTHER): Payer: Medicare Other | Admitting: Cardiology

## 2015-03-25 ENCOUNTER — Ambulatory Visit (HOSPITAL_COMMUNITY)
Admission: RE | Admit: 2015-03-25 | Discharge: 2015-03-25 | Disposition: A | Discharge status: Home / Self Care | Payer: Medicare Other | Source: Ambulatory Visit | Attending: Cardiology | Admitting: Cardiology

## 2015-03-25 VITALS — BP 130/82 | HR 72 | Ht 68.0 in | Wt 163.1 lb

## 2015-03-25 DIAGNOSIS — I708 Atherosclerosis of other arteries: Secondary | ICD-10-CM

## 2015-03-25 DIAGNOSIS — I10 Essential (primary) hypertension: Secondary | ICD-10-CM | POA: Diagnosis not present

## 2015-03-25 DIAGNOSIS — I701 Atherosclerosis of renal artery: Secondary | ICD-10-CM

## 2015-03-25 DIAGNOSIS — I700 Atherosclerosis of aorta: Secondary | ICD-10-CM

## 2015-03-25 DIAGNOSIS — E7800 Pure hypercholesterolemia, unspecified: Secondary | ICD-10-CM | POA: Insufficient documentation

## 2015-03-25 DIAGNOSIS — E785 Hyperlipidemia, unspecified: Secondary | ICD-10-CM

## 2015-03-25 DIAGNOSIS — I70213 Atherosclerosis of native arteries of extremities with intermittent claudication, bilateral legs: Secondary | ICD-10-CM

## 2015-03-25 DIAGNOSIS — I671 Cerebral aneurysm, nonruptured: Secondary | ICD-10-CM

## 2015-03-25 NOTE — Progress Notes (Signed)
PCP: Thressa Sheller, MD  Clinic Note: Chief Complaint  Patient presents with  . Annual Exam    pt states no Sx. or concerns  . PAD    Bilateral SFA, Bilateral Internal Carotid, L RAS  . Hypertension    HPI: Lance Dillon is a 75 y.o. male with a PMH below who presents today for ~1 year followup. He is a former patient Dr. Rollene Fare. He was last seen in January of this year. He has known PAD (with left renal artery stenosis of 70%, status post right femoropopliteal bypass graft in June of 2013) as well as a strong family history of CAD. To date, his cardiac evaluation has been benign. He was actually seen by Dr. Rollene Fare for the first time as part of preoperative evaluation for his vascular surgery. His carotid disease being followed Dr. Kellie Simmering.Lance Dillon was last seen on Feb 18, 2014 --> was doing relatively well.  Noted some mild claudication Sx.  Recent Hospitalizations: none  Studies Reviewed: Renal Dopplers done today Just saw Dr. Gwenlyn Found.  LEA dopplers: Inflow left common femoral artery is triphasic. Dense calcific plaque prevents evaluation of SFA. Cannot identify right lower shown a bypass graft. Marked damping of L2 artery waveform. Consider chronic graft occlusion.  Our ABI 0.67, L ABI 0.55 = bilateral moderate arterial occlusion disease.  Renal artery Dopplers 03/25/2015: Stable> 60% LRA stenosis. Normal kidney size bilaterally. Normal abdominal aorta size with nonocclusive aortoiliac atherosclerotic disease  Interval History: Lance Dillon presents today without any major complaints. He still has mild bilateral calf claudication, but notes that the right is much better since he's had surgery there. He continues to deny any TIA or amaurosis fugax Symptoms. No headache or blurred vision.  From a cardiac standpoint his review of symptoms as follows:   No chest pain or shortness of breath with rest or exertion. No PND, orthopnea or edema. No palpitations, lightheadedness,  dizziness, weakness or syncope/near syncope.  ROS: A comprehensive was performed. Review of Systems  Constitutional: Negative for malaise/fatigue.  HENT: Negative for nosebleeds.   Eyes: Negative for blurred vision.  Respiratory: Negative for cough and shortness of breath.   Cardiovascular: Positive for orthopnea.  Gastrointestinal: Negative for constipation, blood in stool and melena.  Genitourinary: Negative for hematuria.  Musculoskeletal: Positive for back pain (related to scoliosis and DJD). Negative for myalgias and falls.  Neurological: Negative for dizziness and headaches.  Endo/Heme/Allergies: Does not bruise/bleed easily.  Psychiatric/Behavioral: Negative.   All other systems reviewed and are negative.    Past Medical History  Diagnosis Date  . Hypercholesterolemia   . Hypertension   . Macular degeneration   . Hx of radiation therapy 03/22/11 to 05/08/11    PSA recurrent carcinoma of prostate  . Heart murmur     No evidence of valvular lesion noted on Echo 01/2010  . Peripheral vascular disease [facility]) June 2013    Bilateral SFA stenosis: Status post right femoropopliteal bypass - Dr. Kellie Simmering  . Stones in the urinary tract   . Renal artery stenosis (HCC)     70% left  . Arthritis   . Macular degeneration of both eyes   . Blind right eye   . Colon polyps 2004, 07, 14  . Cataract   . Prostate cancer ([place]) 2014  . 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 Atherosclerosis of aortic bifurcation and common iliac arteries [facility]) February 2017     Noted on abdominal/renal Dopplers    Past Surgical History  Procedure Laterality Date  . Prostatectomy retropubic radical  07/15/2002    with nerve sparing,  Gleason 3+4=7  . Penile prosthesis implant    . Penile prosthesis  removal    . Prostate biopsy    . Hernia repair      right side,lft  . Femoral-popliteal bypass graft  07/19/2011    Procedure: BYPASS GRAFT FEMORAL-POPLITEAL ARTERY;  Surgeon: Mal Misty, MD;  Location: [facility] OR;  Service: Vascular;  Laterality: Right;  Right Femoral - Popliteal  Bypss with saphenous vein  . Intraoperative arteriogram  07/19/2011    Procedure: INTRA OPERATIVE ARTERIOGRAM;  Surgeon: Mal Misty, MD;  Location: [place];  Service: Vascular;  Laterality: Right;  . Pr vein bypass graft,aorto-fem-pop  07/19/11    Right  Fem/Pop BPG  . Colonoscopy w/ biopsies      multiple   . Transthoracic echocardiogram  January    Normal EF and overall function  . Nm myoview ltd  June 2013    Normal EF, very small area of basal-mid inferior ischemia; LOW RISK  . Reclast  July 25, 2013  . Abdominal aortagram N/A 06/20/2011    Procedure: ABDOMINAL Maxcine Ham;  Surgeon: Serafina Mitchell, MD;  Location: [facility] CATH LAB;  Service: Cardiovascular;  Laterality: N/A;  . Renal duplex  07/01/2012    Left renal artery 60-99% diameter reduction  . Cardiovascular stress test  07/06/2011    Evidence of mild ischemia in Basal Inferior and Mid Inferior regions, no significant wall motion abnormalities noted.  . Transthoracic echocardiogram  02/23/2010    EF 123456, lv systolic function normal  . Eye surgery Right Aug. 2016    Cataract   MRA Brain 11/2013: IMPRESSION:  No acute stroke. Mild atrophy with chronic microvascular ischemic change.  BILATERAL internal carotid artery occlusion. Collateral flow from The posterior circulation and from external collaterals reconstitute the anterior circulation as described above.  19 x 8 mm fusiform aneurysm of the RIGHT posterior cerebral arteryP1 segment. No evidence for dissection.  Prior to Admission medications   Medication Sig Start Date End Date Taking? Authorizing Provider  acetaminophen  (TYLENOL) 325 MG tablet Take 650 mg by mouth every 6 (six) hours as needed. For pain    Historical Provider, MD  amLODipine (NORVASC) 10 MG tablet Take 10 mg by mouth daily.    Historical Provider, MD  aspirin 81 MG tablet Take 81 mg by mouth daily.    Historical Provider, MD  Calcium Carbonate-Vitamin D (CALTRATE 600+D PO) Take 1 tablet by mouth 2 (two) times daily.     Historical Provider, MD  docusate sodium (COLACE) 100 MG capsule Take 100 mg by mouth at bedtime.    Historical Provider, MD  fluticasone (FLONASE) 50 MCG/ACT nasal spray Place 2 sprays into both nostrils as needed (for nasal congestion).  09/02/12   Historical Provider, MD  ibuprofen (ADVIL,MOTRIN) 200 MG tablet Take 200 mg by mouth every 6 (six) hours as needed. For pain    Historical Provider, MD  latanoprost (XALATAN) 0.005 % ophthalmic solution daily. One drop Left eye every day 09/18/13   Historical Provider, MD  losartan-hydrochlorothiazide (HYZAAR) 100-12.5 MG per tablet Take 1 tablet by mouth daily.    Historical  Provider, MD  LUTEIN PO Take 1 tablet by mouth daily.    Historical Provider, MD  metoprolol succinate (TOPROL-XL) 25 MG 24 hr tablet Take 1 tablet (25 mg total) by mouth daily. 02/19/15   Lorretta Harp, MD  Omega-3 Fatty Acids (FISH OIL) 1200 MG CAPS Take 3,600 mg by mouth daily.    Historical Provider, MD  polyethylene glycol (MIRALAX / GLYCOLAX) packet Take 17 g by mouth at bedtime.    Historical Provider, MD  rosuvastatin (CRESTOR) 10 MG tablet Take 10 mg by mouth daily.    Historical Provider, MD   Allergies  Allergen Reactions  . Avelox [Moxifloxacin Hcl In Nacl] Diarrhea    diarrhea    Social History   Social History  . Marital Status: Married    Spouse Name: N/A  . Number of Children: N/A  . Years of Education: N/A   Occupational History  . Boiler Tech    Social History Main Topics  . Smoking status: Former Smoker -- 3.00 packs/day for 20 years    Types: Cigarettes    Quit date: 07/01/2002    . Smokeless tobacco: Never Used  . Alcohol Use: No  . Drug Use: No  . Sexual Activity: Not Asked   Other Topics Concern  . None   Social History Narrative   Married, 2 children , Oncologist - predominantly commercial, but also some residential Insurance underwriter.   Usually walks 2 miles maybe 2-3 days a week. Just not a cold weather.   He is a former smoker, quit "cold [place] "after 3 pack per day for 60 pack years in 2004   Family History  Problem Relation Age of Onset  . Prostate cancer Brother 52  . Heart disease Brother     Before age 15  . Hypertension Brother   . Heart attack Brother   . Colon cancer Neg Hx   . Aneurysm Father   . Heart disease Father   . Hypertension Father   . Heart disease Mother     Before age 31  . Hypertension Mother   . Heart attack Mother   . Heart disease Sister   . Hypertension Sister   . Heart attack Sister      Wt Readings from Last 3 Encounters:  03/25/15 163 lb 1.6 oz (73.982 kg)  02/19/15 168 lb 1.6 oz (76.25 kg)  09/29/14 159 lb (72.122 kg)    PHYSICAL EXAM BP 130/82 mmHg  Pulse 72  Ht 5\' 8"  (1.727 m)  Wt 163 lb 1.6 oz (73.982 kg)  BMI 24.81 kg/m2 General appearance: alert, cooperative, appears stated age, no distress and Pleasant mood and affect. Answers questions appropriately. Well-nourished well-groomed. HEENT: [place]/AT, EOMI, MMM, anicteric sclera Neck: no adenopathy, no carotid bruit, no JVD and supple, symmetrical, trachea midline Lungs:CTAB, normal percussion bilaterally and Nonlabored, good air movement Heart:RRR, S1&S2 normal and Possible soft S4, 2/6 systolic murmur at LUSB Abdomen: soft, non-tender; bowel sounds normal; no masses, no organomegaly Extremities: extremities normal, atraumatic, no cyanosis or edema Pulses: Weak/faint DP and PT of the left - trace to 1+. Also faint DP and PT on the right. No femoral bruit. Neurologic: Grossly normal   Adult ECG Report  Rate: 72 ;  Rhythm: normal  sinus rhythm and normal axis, intervals & durations.;   Narrative Interpretation: stable EKG   Other studies Reviewed: Additional studies/ records that were reviewed today include:  Recent Labs:  Usually followed by PCP.   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 He is due for follow-up of this. Also noted in that scan was bilateral internal carotid artery stenosis. This would make the PCA aneurysm even more difficult to manage.  I'm not sure who is been following this. The denies any much that can be done from a peripheral standpoint. Simply monitor for worsening dilation.      Relevant Orders   EKG 12-Lead (Completed)   MR MRA Headm   Left renal artery stenosis - 70% by angiography; increased velocity on recent Dopplers (60-99%) (Chronic)   Relevant Orders   EKG 12-Lead (Completed)   Hyperlipidemia LDL goal <70 (Chronic)    Being followed by PCP. He is on Crestor 10 mg daily. With the extent of peripheral vascular disease involving bilateral carotids, renal arteries and abdominal iliac as well as SFA disease, I would change the target LDL less than 70 now.      Essential hypertension (Chronic)    Well-controlled on current regimen with amlodipine and Hyzaar.      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 Kellie Simmering.  However with Dr. Kellie Simmering retiring, the patient is asked if he can follow-up with one offices opposed to 2 offices. It is reasonable for him to continue seeing Dr. Gwenlyn Found who is following his renal artery stenosis.  Continue aggressive cardiovascular risk factor modification. No limiting claudication  symptoms.      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 Aggressive risk factor modification is recommended. Adjusting target HDL greater than 40 and LDL less than 70. Aggressive hypertension management Continue smoking cessation         Current medicines are reviewed at length with the patient today. (+/- concerns) none The following changes have been made: None Studies Ordered:   Orders Placed This Encounter  Procedures  . MR MRA Headm  . EKG 12-Lead   Follow-up 1 year    HARDING, Leonie Green, M.D., M.S. Interventional Cardiologist   Pager # [phone] Phone # [phone] [address] [place], [place] [address]

## 2015-03-25 NOTE — Patient Instructions (Signed)
No change with current medications  Your physician wants you to follow-up in 12 months with Dr Ellyn Hack.   You will receive a reminder letter in the mail two months in advance. If you don't receive a letter, please call our office to schedule the follow-up appointment.  If you need a refill on your cardiac medications before your next appointment, please call your pharmacy.   MRA OF BRAIN NEED TO BE SCHEDULE  Magnetic Resonance Angiogram Magnetic resonance imaging (MRI) is a test that lets your health care provider see detailed pictures of the inside of your body without using X-rays. Instead, strong magnets and radio waves work together in a Research officer, political party to form very detailed and sharp images. The images are viewed on a TV monitor in two- and three-dimensional form. The magnets and radio waves are harmless. Magnetic resonance angiogram, or magnetic resonance angiography (MRA), is an MRI done on your blood vessels. MRA is able to obtain detailed images of blood vessels and blood flow. It can be used to help diagnose and treat heart disorders, stroke, and blood vessel diseases. Contrast material may be injected to make MRA images even more clear. LET [facility] CARE PROVIDER KNOW ABOUT:  Previous surgeries you have had.  Any metal you may have in your body. The magnet used in MRA can cause metal objects in your body to move. Metal can also make it hard to get high-quality images. Objects that contain metal include:  A pacemaker or any other implants, such as an implanted neurostimulator, a metallic ear implant, or a metallic object within the eye socket.  Metal splinters.  Any bullet fragments.  A port for delivering insulin or chemotherapy.  Any tattoos. Some red dyes contain iron which is sometimes a problem.  If you are pregnant or think you may be pregnant. It is best to avoid this test during the first three months of pregnancy unless there is a significant risk of missing a  serious diagnosis without performing the test.  If you are breastfeeding.  If you are afraid of cramped spaces (claustrophobic). If claustrophobia is a problem, it usually can be relieved with mild sedatives or antianxiety medicines.  Any allergies you have.  All medicines you are taking, including vitamins, herbs, eye drops, creams, and over-the-counter medicines. BEFORE THE PROCEDURE  If you are breastfeeding, prepare as directed by your health care provider. You may need to pump breast milk before the exam to give to your baby until the contrast material, if used, has cleared from your body.  You will be asked to remove anything containing metal, such as any watches or jewelry you are wearing. You may also be asked to remove your makeup because some makeup contains traces of metal. Braces and fillings normally are not a problem. PROCEDURE  You may be given earplugs or headphones to listen to music. The machine can be noisy.  A contrast material may be injected.  MRA is done in a long, magnetic chamber. You will lie down on a platform that slides into the magnetic chamber. Once inside, you will still be able to talk to the health care provider.  You will be asked to hold very still. The health care provider will tell you when you can shift position. You may have to wait a few minutes to make sure the images produced during the procedure are readable. AFTER THE PROCEDURE  You may resume normal activities right away.  If you were given contrast material, it will  pass naturally through your body within a day.  A health care provider experienced in MRA will analyze the results and send a report to your health care provider, along with an interpretation of the findings.   This information is not intended to replace advice given to you by your health care provider. Make sure you discuss any questions you have with your health care provider.   Document Released: 04/08/2003 Document  Revised: 02/06/2014 Document Reviewed: 02/21/2013 Elsevier Interactive Patient Education [employer].

## 2015-03-28 ENCOUNTER — Encounter: Payer: Self-pay | Admitting: Cardiology

## 2015-03-28 NOTE — Assessment & Plan Note (Signed)
Being followed by PCP. He is on Crestor 10 mg daily. With the extent of peripheral vascular disease involving bilateral carotids, renal arteries and abdominal iliac as well as SFA disease, I would change the target LDL less than 70 now.

## 2015-03-28 NOTE — Assessment & Plan Note (Signed)
Noted on MRI of the brain in 2015. He is due for follow-up of this. Also noted in that scan was bilateral internal carotid artery stenosis. This would make the PCA aneurysm even more difficult to manage.  I'm not sure who is been following this. The denies any much that can be done from a peripheral standpoint. Simply monitor for worsening dilation.

## 2015-03-28 NOTE — Assessment & Plan Note (Signed)
Not unexpectedly noted on Dopplers. Aggressive risk factor modification is recommended. Adjusting target HDL greater than 40 and LDL less than 70. Aggressive hypertension management Continue smoking cessation

## 2015-03-28 NOTE — Assessment & Plan Note (Signed)
This has been followed by Dr. Kellie Simmering.  However with Dr. Kellie Simmering retiring, the patient is asked if he can follow-up with one offices opposed to 2 offices. It is reasonable for him to continue seeing Dr. Gwenlyn Found who is following his renal artery stenosis.  Continue aggressive cardiovascular risk factor modification. No limiting claudication symptoms.

## 2015-03-28 NOTE — Assessment & Plan Note (Signed)
Well-controlled on current regimen with amlodipine and Hyzaar.

## 2015-03-29 ENCOUNTER — Ambulatory Visit
Admission: RE | Admit: 2015-03-29 | Discharge: 2015-03-29 | Disposition: A | Discharge status: Home / Self Care | Payer: Medicare Other | Source: Ambulatory Visit | Attending: Cardiology | Admitting: Cardiology

## 2015-03-29 DIAGNOSIS — I671 Cerebral aneurysm, nonruptured: Secondary | ICD-10-CM

## 2015-04-01 ENCOUNTER — Telehealth: Payer: Self-pay | Admitting: *Deleted

## 2015-04-01 DIAGNOSIS — I159 Secondary hypertension, unspecified: Secondary | ICD-10-CM

## 2015-04-01 NOTE — Telephone Encounter (Signed)
-----   Message from Leonie Man, MD sent at 03/30/2015  5:18 PM EST ----- OK -- will refer to NSgx -- need to ask scheduling for interventional NSgx.  [place]

## 2015-04-01 NOTE — Telephone Encounter (Signed)
LEFT MESSAGE TO CALL BACK - MRI SCAN

## 2015-04-02 NOTE — Telephone Encounter (Signed)
Spoke to patient and wife . Result given . Verbalized understanding Patient and wife aware an appointment will be with  interventional nuerosurgeon

## 2015-04-06 ENCOUNTER — Telehealth: Payer: Self-pay | Admitting: Cardiology

## 2015-04-06 NOTE — Telephone Encounter (Signed)
Lance Dillon is calling because she is waiting for a referral to a Neurologist for Lance Dillon.. Please call   Thanks

## 2015-04-06 NOTE — Telephone Encounter (Signed)
Spoke with pt wife, aware order is placed for referral. Will have our check out folks check on statis.

## 2015-04-10 NOTE — Progress Notes (Signed)
Quick Note:  After lots of investigation. It seems like the best person for this patient follow-up with his Dr. Patrecia Pour from neuroradiology.  I recently sent you a message on another patient about having to follow-up with Dr. Patrecia Pour -  But see if we can get him scheduled as well.  Leonie Man, MD  ______

## 2015-04-12 ENCOUNTER — Telehealth: Payer: Self-pay | Admitting: Cardiology

## 2015-04-12 NOTE — Telephone Encounter (Signed)
Called neurosurgeon office on 04/07/15.  They requested records which i faxed on 04/07/15.

## 2015-04-13 ENCOUNTER — Telehealth: Payer: Self-pay | Admitting: *Deleted

## 2015-04-13 DIAGNOSIS — I10 Essential (primary) hypertension: Secondary | ICD-10-CM

## 2015-04-13 DIAGNOSIS — I671 Cerebral aneurysm, nonruptured: Secondary | ICD-10-CM

## 2015-04-13 NOTE — Telephone Encounter (Signed)
-----   Message from Leonie Man, MD sent at 04/10/2015  2:08 PM EST ----- DR. Devashwar's scheduler # - [phone]

## 2015-04-13 NOTE — Telephone Encounter (Signed)
-----   Message from Leonie Man, MD sent at 04/10/2015  2:08 PM EST ----- DR. Devashwar's scheduler # - [phone]

## 2015-04-13 NOTE — Telephone Encounter (Signed)
Spoke to Dr Ellyn Hack. Confirm the referral to Dr Patrecia Pour for patient Lance Dillon Dr Ellyn Hack had conversation with  another colleague. Per Dr Ellyn Hack, patient needs to see interventional radiologist. spoke to wife - informed her of the referral She states the patient has an appointment tomorrow- with neurosurgeon She and patient  would like to keep the appointment- to get all the options Referral also sent to Dr Patrecia Pour for an appointment.

## 2015-04-13 NOTE — Telephone Encounter (Signed)
REFERRAL SENT TO [facility] - DR Patrecia Pour WIFE IS AWARE OF THE REFERRAL

## 2015-04-15 ENCOUNTER — Other Ambulatory Visit (HOSPITAL_COMMUNITY): Payer: Self-pay | Admitting: Interventional Radiology

## 2015-04-15 DIAGNOSIS — IMO0002 Reserved for concepts with insufficient information to code with codable children: Secondary | ICD-10-CM

## 2015-04-20 ENCOUNTER — Other Ambulatory Visit (HOSPITAL_COMMUNITY): Payer: Self-pay | Admitting: Interventional Radiology

## 2015-04-20 ENCOUNTER — Ambulatory Visit (HOSPITAL_COMMUNITY)
Admission: RE | Admit: 2015-04-20 | Discharge: 2015-04-20 | Disposition: A | Discharge status: Home / Self Care | Payer: Medicare Other | Source: Ambulatory Visit | Attending: Interventional Radiology | Admitting: Interventional Radiology

## 2015-04-20 DIAGNOSIS — IMO0002 Reserved for concepts with insufficient information to code with codable children: Secondary | ICD-10-CM

## 2015-04-20 DIAGNOSIS — I671 Cerebral aneurysm, nonruptured: Secondary | ICD-10-CM

## 2015-04-26 ENCOUNTER — Other Ambulatory Visit: Payer: Self-pay | Admitting: Radiology

## 2015-04-27 ENCOUNTER — Telehealth: Payer: Self-pay

## 2015-04-27 ENCOUNTER — Encounter (HOSPITAL_COMMUNITY): Payer: Self-pay

## 2015-04-27 ENCOUNTER — Ambulatory Visit (HOSPITAL_COMMUNITY): Payer: Medicare Other

## 2015-04-27 ENCOUNTER — Ambulatory Visit (HOSPITAL_COMMUNITY)
Admission: RE | Admit: 2015-04-27 | Discharge: 2015-04-27 | Disposition: A | Discharge status: Home / Self Care | Payer: Medicare Other | Source: Ambulatory Visit | Attending: Interventional Radiology | Admitting: Interventional Radiology

## 2015-04-27 ENCOUNTER — Other Ambulatory Visit (HOSPITAL_COMMUNITY): Payer: Self-pay | Admitting: Interventional Radiology

## 2015-04-27 DIAGNOSIS — Z8249 Family history of ischemic heart disease and other diseases of the circulatory system: Secondary | ICD-10-CM | POA: Diagnosis not present

## 2015-04-27 DIAGNOSIS — Z7982 Long term (current) use of aspirin: Secondary | ICD-10-CM | POA: Diagnosis not present

## 2015-04-27 DIAGNOSIS — H5441 Blindness, right eye, normal vision left eye: Secondary | ICD-10-CM | POA: Insufficient documentation

## 2015-04-27 DIAGNOSIS — I6523 Occlusion and stenosis of bilateral carotid arteries: Secondary | ICD-10-CM | POA: Diagnosis not present

## 2015-04-27 DIAGNOSIS — I671 Cerebral aneurysm, nonruptured: Secondary | ICD-10-CM | POA: Diagnosis present

## 2015-04-27 DIAGNOSIS — Z86010 Personal history of colonic polyps: Secondary | ICD-10-CM | POA: Insufficient documentation

## 2015-04-27 DIAGNOSIS — I701 Atherosclerosis of renal artery: Secondary | ICD-10-CM | POA: Insufficient documentation

## 2015-04-27 DIAGNOSIS — M1990 Unspecified osteoarthritis, unspecified site: Secondary | ICD-10-CM | POA: Diagnosis not present

## 2015-04-27 DIAGNOSIS — Z87891 Personal history of nicotine dependence: Secondary | ICD-10-CM | POA: Diagnosis not present

## 2015-04-27 DIAGNOSIS — H3530 Unspecified macular degeneration: Secondary | ICD-10-CM | POA: Insufficient documentation

## 2015-04-27 DIAGNOSIS — I10 Essential (primary) hypertension: Secondary | ICD-10-CM | POA: Insufficient documentation

## 2015-04-27 DIAGNOSIS — Z923 Personal history of irradiation: Secondary | ICD-10-CM | POA: Diagnosis not present

## 2015-04-27 DIAGNOSIS — Z8546 Personal history of malignant neoplasm of prostate: Secondary | ICD-10-CM | POA: Diagnosis not present

## 2015-04-27 DIAGNOSIS — E7800 Pure hypercholesterolemia, unspecified: Secondary | ICD-10-CM | POA: Insufficient documentation

## 2015-04-27 DIAGNOSIS — I70219 Atherosclerosis of native arteries of extremities with intermittent claudication, unspecified extremity: Secondary | ICD-10-CM

## 2015-04-27 DIAGNOSIS — I739 Peripheral vascular disease, unspecified: Secondary | ICD-10-CM | POA: Insufficient documentation

## 2015-04-27 DIAGNOSIS — Z9889 Other specified postprocedural states: Secondary | ICD-10-CM

## 2015-04-27 LAB — BASIC METABOLIC PANEL
ANION GAP: 9 (ref 5–15)
BUN: 21 mg/dL — ABNORMAL HIGH (ref 6–20)
CALCIUM: 9.6 mg/dL (ref 8.9–10.3)
CO2: 27 mmol/L (ref 22–32)
Chloride: 104 mmol/L (ref 101–111)
Creatinine, Ser: 1.14 mg/dL (ref 0.61–1.24)
Glucose, Bld: 101 mg/dL — ABNORMAL HIGH (ref 65–99)
Potassium: 3.8 mmol/L (ref 3.5–5.1)
Sodium: 140 mmol/L (ref 135–145)

## 2015-04-27 LAB — CBC
HCT: 40.4 % (ref 39.0–52.0)
HEMOGLOBIN: 13.7 g/dL (ref 13.0–17.0)
MCH: 31.9 pg (ref 26.0–34.0)
MCHC: 33.9 g/dL (ref 30.0–36.0)
MCV: 94 fL (ref 78.0–100.0)
Platelets: 184 10*3/uL (ref 150–400)
RBC: 4.3 MIL/uL (ref 4.22–5.81)
RDW: 12.9 % (ref 11.5–15.5)
WBC: 4.7 10*3/uL (ref 4.0–10.5)

## 2015-04-27 LAB — APTT: APTT: 30 s (ref 24–37)

## 2015-04-27 LAB — PROTIME-INR
INR: 1 (ref 0.00–1.49)
PROTHROMBIN TIME: 13.4 s (ref 11.6–15.2)

## 2015-04-27 MED ORDER — SODIUM CHLORIDE 0.9 % IV SOLN: INTRAVENOUS | Status: AC

## 2015-04-27 MED ORDER — MIDAZOLAM HCL 2 MG/2ML IJ SOLN
INTRAMUSCULAR | Status: AC | PRN
  Administered 2015-04-27: 1 mg via INTRAVENOUS

## 2015-04-27 MED ORDER — FENTANYL CITRATE (PF) 100 MCG/2ML IJ SOLN
INTRAMUSCULAR | Status: AC | PRN
  Administered 2015-04-27: 25 ug via INTRAVENOUS

## 2015-04-27 MED ORDER — HEPARIN SOD (PORK) LOCK FLUSH 100 UNIT/ML IV SOLN
INTRAVENOUS | Status: AC
  Filled 2015-04-27: qty 20

## 2015-04-27 MED ORDER — CEFAZOLIN SODIUM-DEXTROSE 2-4 GM/100ML-% IV SOLN
2.0000 g | Freq: Once | INTRAVENOUS | Status: AC
  Administered 2015-04-27: 2 g via INTRAVENOUS

## 2015-04-27 MED ORDER — SODIUM CHLORIDE 0.9 % IV SOLN: Freq: Once | INTRAVENOUS | Status: DC

## 2015-04-27 MED ORDER — LIDOCAINE HCL 1 % IJ SOLN
INTRAMUSCULAR | Status: AC
  Filled 2015-04-27: qty 20

## 2015-04-27 MED ORDER — IOHEXOL 300 MG/ML  SOLN
150.0000 mL | Freq: Once | INTRAMUSCULAR | Status: AC | PRN
Start: 2015-04-27 — End: 2015-04-27
  Administered 2015-04-27: 75 mL via INTRAVENOUS

## 2015-04-27 MED ORDER — CEFAZOLIN SODIUM-DEXTROSE 2-3 GM-% IV SOLR
INTRAVENOUS | Status: AC
  Filled 2015-04-27: qty 50

## 2015-04-27 MED ORDER — MIDAZOLAM HCL 2 MG/2ML IJ SOLN
INTRAMUSCULAR | Status: AC
  Filled 2015-04-27: qty 2

## 2015-04-27 MED ORDER — FENTANYL CITRATE (PF) 100 MCG/2ML IJ SOLN
INTRAMUSCULAR | Status: AC
  Filled 2015-04-27: qty 2

## 2015-04-27 NOTE — Sedation Documentation (Signed)
Patient is resting comfortably. 

## 2015-04-27 NOTE — Telephone Encounter (Signed)
Phone call from pt's wife.  Reported pt. Had a cerebral angiogram today, and was told per the Interventional Radiologist that he has a blockage in the left leg @ the groin, and will need to be evaluated by Dr. Kellie Simmering, prior to having the Cerebral Aneurysm repaired.  Reported that the pt. C/o left calf pain with walking.  Stated he has no ulcers or rest pain of left leg.  Requested an appt. ASAP for eval. Of left leg, so he can go on to have the cerebral aneurysm repaired.  Advised will pt. with an appt.  Agreed.

## 2015-04-27 NOTE — Discharge Instructions (Signed)
Angiogram, Care After °Refer to this sheet in the next few weeks. These instructions provide you with information about caring for yourself after your procedure. Your health care provider may also give you more specific instructions. Your treatment has been planned according to current medical practices, but problems sometimes occur. Call your health care provider if you have any problems or questions after your procedure. °WHAT TO EXPECT AFTER THE PROCEDURE °After your procedure, it is typical to have the following: °· Bruising at the catheter insertion site that usually fades within 1-2 weeks. °· Blood collecting in the tissue (hematoma) that may be painful to the touch. It should usually decrease in size and tenderness within 1-2 weeks. °HOME CARE INSTRUCTIONS °· Take medicines only as directed by your health care provider. °· You may shower 24-48 hours after the procedure or as directed by your health care provider. Remove the bandage (dressing) and gently wash the site with plain soap and water. Pat the area dry with a clean towel. Do not rub the site, because this may cause bleeding. °· Do not take baths, swim, or use a hot tub until your health care provider approves. °· Check your insertion site every day for redness, swelling, or drainage. °· Do not apply powder or lotion to the site. °· Do not lift over 10 lb (4.5 kg) for 5 days after your procedure or as directed by your health care provider. °· Ask your health care provider when it is okay to: °¨ Return to work or school. °¨ Resume usual physical activities or sports. °¨ Resume sexual activity. °· Do not drive home if you are discharged the same day as the procedure. Have someone else drive you. °· You may drive 24 hours after the procedure unless otherwise instructed by your health care provider. °· Do not operate machinery or power tools for 24 hours after the procedure or as directed by your health care provider. °· If your procedure was done as an  outpatient procedure, which means that you went home the same day as your procedure, a responsible adult should be with you for the first 24 hours after you arrive home. °· Keep all follow-up visits as directed by your health care provider. This is important. °SEEK MEDICAL CARE IF: °· You have a fever. °· You have chills. °· You have increased bleeding from the catheter insertion site. Hold pressure on the site and call 911. °SEEK IMMEDIATE MEDICAL CARE IF: °· You have unusual pain at the catheter insertion site. °· You have redness, warmth, or swelling at the catheter insertion site. °· You have drainage (other than a small amount of blood on the dressing) from the catheter insertion site. °· The catheter insertion site is bleeding, and the bleeding does not stop after 30 minutes of holding steady pressure on the site. °· The area near or just beyond the catheter insertion site becomes pale, cool, tingly, or numb. °  °This information is not intended to replace advice given to you by your health care provider. Make sure you discuss any questions you have with your health care provider. °  °Document Released: 08/04/2004 Document Revised: 02/06/2014 Document Reviewed: 06/19/2012 °Elsevier Interactive Patient Education ©2016 [employer] ° °

## 2015-04-27 NOTE — H&P (Signed)
Chief Complaint: Patient was seen in consultation today for cerebral arteriogram at the request of Dr Glenetta Hew  Referring Physician(s): Dr Glenetta Hew  Supervising Physician: Luanne Bras  History of Present Illness: Lance Dillon is a 75 y.o. male   Pt has been aware of cerebral aneurysm since 2015 when presented to ED at [facility] with dizziness/N/V. Remained asymptomatic and without follow up---none was ever suggested.  Pt sees Dr Ellyn Hack Cardiologist and he sent pt for recheck MRI 03/29/15 IMPRESSION: 1. 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 The aneurysm has increased in diameter by 5 mm since 2015. 2. 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 Noted aplastic left A1 segment. 3. #2 causes suboptimal visualization of the bilateral proximal MCA vasculature. No evidence of change since 2015.  Was referred to Dr Estanislado Pandy and consulted 04/22/15 Now scheduled for cerebral arteriogram for evaluation   Past Medical History  Diagnosis Date  . Hypercholesterolemia   . Hypertension   . Macular degeneration   . Hx of radiation therapy 03/22/11 to 05/08/11    PSA recurrent carcinoma of prostate  . Heart murmur     No evidence of valvular lesion noted on Echo 01/2010  . Peripheral vascular disease [facility]) June 2013    Bilateral SFA stenosis: Status post right femoropopliteal bypass - Dr. Kellie Simmering  . Stones in the urinary tract   . Renal artery stenosis (HCC)     70% left  . Arthritis   . Macular degeneration of both eyes   . Blind right eye   . Colon polyps 2004, 07, 14  . Cataract   . Prostate cancer ([place]) 2014  . 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 Atherosclerosis of aortic bifurcation and common iliac arteries [facility]) February 2017     Noted on abdominal/renal Dopplers    Past Surgical History  Procedure Laterality Date  . Prostatectomy retropubic radical  07/15/2002    with nerve sparing, Gleason 3+4=7  . Penile prosthesis implant    . Penile prosthesis  removal    . Prostate biopsy    . Hernia repair      right side,lft  . Femoral-popliteal bypass graft  07/19/2011    Procedure: BYPASS GRAFT FEMORAL-POPLITEAL ARTERY;  Surgeon: Mal Misty, MD;  Location: [facility] OR;  Service: Vascular;  Laterality: Right;  Right Femoral - Popliteal  Bypss with saphenous vein  . Intraoperative arteriogram  07/19/2011    Procedure: INTRA OPERATIVE ARTERIOGRAM;  Surgeon: Mal Misty, MD;  Location: [place];  Service: Vascular;  Laterality: Right;  . Pr vein bypass graft,aorto-fem-pop  07/19/11    Right  Fem/Pop BPG  . Colonoscopy w/ biopsies      multiple   . Transthoracic echocardiogram  January    Normal EF and overall function  . Nm myoview ltd  June 2013    Normal EF, very small area of basal-mid inferior ischemia; LOW RISK  . Reclast  July 25, 2013  . Abdominal aortagram N/A 06/20/2011    Procedure: ABDOMINAL Maxcine Ham;  Surgeon: Serafina Mitchell, MD;  Location: [facility] CATH LAB;  Service: Cardiovascular;  Laterality: N/A;  . Renal duplex  07/01/2012  Left renal artery 60-99% diameter reduction  . Cardiovascular stress test  07/06/2011    Evidence of mild ischemia in Basal Inferior and Mid Inferior regions, no significant wall motion abnormalities noted.  . Transthoracic echocardiogram  02/23/2010    EF 123456, lv systolic function normal  . Eye surgery Right Aug. 2016    Cataract    Allergies: Avelox  Medications: Prior to Admission medications   Medication Sig Start Date End Date Taking? Authorizing Provider  acetaminophen (TYLENOL) 325 MG tablet Take 650 mg by mouth every 6 (six) hours as needed. For  pain   Yes Historical Provider, MD  amLODipine (NORVASC) 10 MG tablet Take 10 mg by mouth daily.   Yes Historical Provider, MD  aspirin 81 MG tablet Take 81 mg by mouth daily.   Yes Historical Provider, MD  Calcium Carbonate-Vitamin D (CALTRATE 600+D PO) Take 1 tablet by mouth 2 (two) times daily.    Yes Historical Provider, MD  docusate sodium (COLACE) 100 MG capsule Take 100 mg by mouth at bedtime.   Yes Historical Provider, MD  fluticasone (FLONASE) 50 MCG/ACT nasal spray Place 2 sprays into both nostrils as needed (for nasal congestion).  09/02/12  Yes Historical Provider, MD  ibuprofen (ADVIL,MOTRIN) 200 MG tablet Take 200 mg by mouth every 6 (six) hours as needed for mild pain.    Yes Historical Provider, MD  latanoprost (XALATAN) 0.005 % ophthalmic solution daily. One drop Left eye every day 09/18/13  Yes Historical Provider, MD  losartan-hydrochlorothiazide (HYZAAR) 100-12.5 MG per tablet Take 1 tablet by mouth daily.   Yes Historical Provider, MD  LUTEIN PO Take 1 tablet by mouth daily.   Yes Historical Provider, MD  metoprolol succinate (TOPROL-XL) 25 MG 24 hr tablet Take 1 tablet (25 mg total) by mouth daily. 02/19/15  Yes Lorretta Harp, MD  Omega-3 Fatty Acids (FISH OIL) 1200 MG CAPS Take 3,600 mg by mouth daily.   Yes Historical Provider, MD  polyethylene glycol (MIRALAX / GLYCOLAX) packet Take 17 g by mouth at bedtime.   Yes Historical Provider, MD  rosuvastatin (CRESTOR) 10 MG tablet Take 10 mg by mouth daily.   Yes Historical Provider, MD     Family History  Problem Relation Age of Onset  . Prostate cancer Brother 53  . Heart disease Brother     Before age 41  . Hypertension Brother   . Heart attack Brother   . Colon cancer Neg Hx   . Aneurysm Father   . Heart disease Father   . Hypertension Father   . Heart disease Mother     Before age 67  . Hypertension Mother   . Heart attack Mother   . Heart disease Sister   . Hypertension Sister   . Heart attack Sister      Social History   Social History  . Marital Status: Married    Spouse Name: N/A  . Number of Children: N/A  . Years of Education: N/A   Occupational History  . Boiler Tech    Social History Main Topics  . Smoking status: Former Smoker -- 3.00 packs/day for 20 years    Types: Cigarettes    Quit date: 07/01/2002  . Smokeless tobacco: Never Used  . Alcohol Use: No  . Drug Use: No  . Sexual Activity: Not Asked   Other Topics Concern  . 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   Usually walks 2 miles maybe 2-3 days a week. Just not a cold weather.   He is a former smoker, quit "cold [place] "after 3 pack per day for 60 pack years in 2004    Review of Systems: A 12 point ROS discussed and pertinent positives are indicated in the HPI above.  All other systems are negative.  Review of Systems  Constitutional: Negative for fever, activity change, appetite change and fatigue.  HENT: Negative for tinnitus.   Eyes: Negative for visual disturbance.  Respiratory: Negative for cough and shortness of breath.   Cardiovascular: Negative for chest pain.  Gastrointestinal: Negative for abdominal pain.  Musculoskeletal: Negative for gait problem.  Neurological: Negative for dizziness, tremors, seizures, syncope, facial asymmetry, speech difficulty, weakness, light-headedness, numbness and headaches.  Psychiatric/Behavioral: Negative for behavioral problems and confusion.    Vital Signs: BP 134/81 mmHg  Temp(Src) 97.9 F (36.6 C) (Oral)  Resp 10  Ht 5\' 8"  (1.727 m)  Wt 159 lb (72.122 kg)  BMI 24.18 kg/m2  SpO2 99%  Physical Exam  Constitutional: He is oriented to person, place, and time.  Cardiovascular: Normal rate, regular rhythm and normal heart sounds.   Pulmonary/Chest: Effort normal and breath sounds normal.  Abdominal: Soft. Bowel sounds are normal. There is  no tenderness.  Genitourinary:  Hx prostate Ca Wears penile "clamp"---for leakage  Musculoskeletal: Normal range of motion.  Neurological: He is alert and oriented to person, place, and time.  Skin: Skin is warm and dry.  Psychiatric: He has a normal mood and affect. His behavior is normal. Judgment and thought content normal.  Nursing note and vitals reviewed.   Mallampati Score:  MD Evaluation Airway: WNL Heart: WNL Abdomen: WNL Chest/ Lungs: WNL ASA  Classification: 3 Mallampati/Airway Score: One  Imaging: Mr Lovenia Kim  03/29/2015  CLINICAL DATA:  Annual follow-up aneurysm. EXAM: MRA HEAD WITHOUT CONTRAST TECHNIQUE: Angiographic images of the Circle of Willis were obtained using MRA technique without intravenous contrast. COMPARISON:  12/16/2013 FINDINGS: Chronic bilateral ICA occlusion. Tangle of collaterals at the left cavernous sinus region to the foramen ovale. There are also bilateral sizable posterior cerebral arteries. Aplastic or hypoplastic left A1 segment. Dolichoectasia of the vertebrobasilar system. Unchanged extent of the fusiform aneurysm from the basilar terminus into the distal most right posterior communicating artery, measuring 8.8 mm in diameter as compared 8.2 mm previously (remeasured at the same level). No new saccular component. No convincing evidence of intramural thrombus or dissection. Due to vessel direction, turbulent aneurysmal flow and, and tortuosity, there is suboptimal signal in the proximal bilateral MCA vasculature, worse on the right. There is no convincing new stenosis or major branch occlusion. Left PICA origin not visualized. Chronic lacune or dilated perivascular space in the right thalamus. IMPRESSION: 1. 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 The aneurysm has increased in diameter by 5 mm since 2015. 2. 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 Noted aplastic left A1  segment. 3. #2 causes suboptimal visualization of the bilateral proximal MCA vasculature. No evidence of change since 2015. Electronically Signed   By: Monte Fantasia M.D.   On: 03/29/2015 10:10   Ir Radiologist Eval & Mgmt  04/22/2015  EXAM: NEW PATIENT OFFICE VISIT CHIEF COMPLAINT: Enlarging intracranial aneurysm. Current Pain Level: 1-10 HISTORY OF PRESENT ILLNESS: The patient is a 75 year old, right-handed gentleman referred for evaluating management of an enlarging intracranial aneurysm. The patient is  accompanied by his daughter and his wife. The patient was first discovered to harbor a right posterior communicating artery region aneurysm in 2015 following a spell of vertigo and nausea and vomiting which did not entail parenchymal infarction. Workup at that time which involved an MRI of the brain and MRA of the brain revealed a large fusiform right posterior communicating artery region aneurysm. The MRA of the brain at that time also revealed the high possibility of occluded bilateral internal carotid arteries extracranially. The vertebral arteries were noted to be hypertrophic with primarily supplying the intracranial compartment. During a recent follow-up MRA of the brain with patient's cardiologist it was discovered that the aneurysm had enlarged lengthwise and also in diameter. Clinically however the patient remains essentially unchanged. He is noted to have had dragging of his left foot which he has had for many many years. He denies symptoms of left arm numbness, weakness, left facial weakness or double vision. 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 He denies any other symptoms of speech difficulties, swallowing difficulties, recent vertigo, incoordination, or passing out spells. He denies any seizures. Past Medical History: Significant for hypercholesterolemia, hypertension, macular degeneration, history of  prostate carcinoma requiring radiation treatment. Peripheral vascular disease, bilateral SFA stenosis with a right femoral popliteal bypass. Renal stones. Renal artery stenosis. Arthritis. Colonic polyps. Atherosclerosis of the aortic bifurcation and common iliac arteries. Bilateral renal artery stenosis. Ejection fraction of 55% on 02/23/2010. Past Surgical History prostatic near retropubic radical surgery for prostate carcinoma. Penile prosthesis. Penile prosthesis removal. Transthoracic echocardiogram. Medications: Tylenol. Amlodipine. Calcium carbonate vitamin-D. Flonase nasal spray. Ibuprofen. Xalatan. Losartan hydrochlorothiazide combination. Lutein. Metoprolol. Omega 3 fatty acids. MiraLax. Crestor. Allergies: Allergic to Avelox antibiotic. Social History: Patient is married. Has two children alive and well. Fourteen siblings. Patient works in a Higher education careers adviser. Denies smoking cigarettes but has done so in the past. Denies use of alcohol or recreational chemicals. Family History: Carcinoma. High blood pressure, heart disease, lung disease, stroke in his father. REVIEW OF SYSTEMS: Essentially negative except for as mentioned above. PHYSICAL EXAMINATION: In no acute distress.  Affect normal. Neurologically able to read objects with left eye covering his right eye. However he is able to discern light and darkness and blurring images. EOMs full. No facial asymmetry noted. Speech and comprehension normal. No facial droop. Tongue midline. Motor: Patient apparently dragging is left lower extremity associated with a slightly antalgic gait. This according to the patient is chronic. ASSESSMENT AND PLAN: Patient's recent MRA of the brain was compared with the previous MRI/MRA brain in 2015 and CT of the brain in 2015. It does show a large fusiform aneurysm of the right posterior communicating artery with opacification of the right internal carotid artery in the supraclinoid segment. The posterior  cerebral arteries are patent with flow signal noted. The aneurysm measures approximately 19 x 8 mm with an eccentric protrusion along the superior aspect. Both vertebral arteries appear to be hypertrophic with nonvisualization of the internal carotid arteries in the neck. There appears to be some reconstitution from the external carotid artery branches. The natural history of fusiform aneurysms was reviewed with the patient and patient's family. The aneurysm apparently has increased since the previous study of 2015. The patient and family were informed that the large fusiform aneurysm of the posterior circulation was a challenging disease. Prior to considering further management options which may include continued follow-up until more symptoms  appear, those related to either mass-effect or microembolizations versus consideration of some form of treatment probably endovascular with flow diverting device if angiographically safe and possible, a formal catheter angiogram would be needed. The procedure, risks, the benefits of the angiogram were all reviewed. Questions were answered. The patient is fully competent and still at work and active, would like to proceed with the diagnostic catheter angiogram prior to further considerations. This will be arranged at the earliest possible. They were asked to call should they have any concerns or questions. They leave with good understanding and agreement with above management plan. Electronically Signed   By: Luanne Bras M.D.   On: 04/20/2015 17:38    Labs:  CBC:  Recent Labs  04/27/15 0655  WBC 4.7  HGB 13.7  HCT 40.4  PLT 184    COAGS:  Recent Labs  04/27/15 0655  INR 1.00  APTT 30    BMP:  Recent Labs  04/27/15 0655  NA 140  K 3.8  CL 104  CO2 27  GLUCOSE 101*  BUN 21*  CALCIUM 9.6  CREATININE 1.14  GFRNONAA >60  GFRAA >60    LIVER FUNCTION TESTS: No results for input(s): BILITOT, AST, ALT, ALKPHOS, PROT, ALBUMIN in the last  8760 hours.  TUMOR MARKERS: No results for input(s): AFPTM, CEA, CA199, CHROMGRNA in the last 8760 hours.  Assessment and Plan:  Known posterior communicating artery aneurysm Enlarging per MRI Scheduled for cerebral arteriogram Risks and Benefits discussed with the patient including, but not limited to bleeding, infection, vascular injury, contrast induced renal failure, stroke or even death. All of the patient's questions were answered, patient is agreeable to proceed. Consent signed and in chart.  Thank you for this interesting consult.  I greatly enjoyed meeting Lance Dillon and look forward to participating in their care.  A copy of this report was sent to the requesting provider on this date.  Electronically Signed: Monia Sabal A 04/27/2015, 8:27 AM   I spent a total of  30 Minutes   in face to face in clinical consultation, greater than 50% of which was counseling/coordinating care for cerebral arteriogram

## 2015-04-27 NOTE — Procedures (Signed)
S/P 4 vessel cerebral arteriogram Lt CFA approach. Findings. 1Large  63mm x 71mm fusiform aneurysm of RT PCOM . 2.Occluded RT ICA at the bulb. 3.Occluded LT ICA at petrous cervical junction with reconstituition from the IMAX branches.

## 2015-04-27 NOTE — Sedation Documentation (Signed)
Patient denies pain and is resting comfortably.  

## 2015-04-28 ENCOUNTER — Other Ambulatory Visit (HOSPITAL_COMMUNITY): Payer: Self-pay | Admitting: Interventional Radiology

## 2015-04-28 DIAGNOSIS — I671 Cerebral aneurysm, nonruptured: Secondary | ICD-10-CM

## 2015-04-28 NOTE — Telephone Encounter (Signed)
Last ABI's done 08/2014.  Pt. will need updated study to determine degree of change in blood flow. Will attempt to bring pt. In at next earliest available appt.

## 2015-04-28 NOTE — Telephone Encounter (Signed)
At this time, I do not have an available ABI spot between today and 04/04- can he be seen without the ABI?   I will continue to look for cancellations, or potential patients to move as well.

## 2015-04-29 ENCOUNTER — Ambulatory Visit (HOSPITAL_COMMUNITY)
Admission: RE | Admit: 2015-04-29 | Discharge: 2015-04-29 | Disposition: A | Discharge status: Home / Self Care | Payer: Medicare Other | Source: Ambulatory Visit | Attending: Family | Admitting: Family

## 2015-04-29 DIAGNOSIS — I10 Essential (primary) hypertension: Secondary | ICD-10-CM | POA: Insufficient documentation

## 2015-04-29 DIAGNOSIS — E7800 Pure hypercholesterolemia, unspecified: Secondary | ICD-10-CM | POA: Diagnosis not present

## 2015-04-29 DIAGNOSIS — I70219 Atherosclerosis of native arteries of extremities with intermittent claudication, unspecified extremity: Secondary | ICD-10-CM

## 2015-04-29 DIAGNOSIS — Z9889 Other specified postprocedural states: Secondary | ICD-10-CM | POA: Diagnosis not present

## 2015-04-29 DIAGNOSIS — I739 Peripheral vascular disease, unspecified: Secondary | ICD-10-CM | POA: Diagnosis not present

## 2015-04-29 DIAGNOSIS — M79662 Pain in left lower leg: Secondary | ICD-10-CM | POA: Diagnosis present

## 2015-05-03 ENCOUNTER — Encounter: Payer: Self-pay | Admitting: Family

## 2015-05-04 ENCOUNTER — Encounter: Payer: Self-pay | Admitting: Family

## 2015-05-04 ENCOUNTER — Ambulatory Visit (INDEPENDENT_AMBULATORY_CARE_PROVIDER_SITE_OTHER): Payer: Medicare Other | Admitting: Family

## 2015-05-04 ENCOUNTER — Ambulatory Visit: Payer: Medicare Other | Admitting: Family

## 2015-05-04 VITALS — BP 124/76 | HR 70 | Temp 97.9°F | Resp 16 | Ht 68.0 in | Wt 168.0 lb

## 2015-05-04 DIAGNOSIS — Z87891 Personal history of nicotine dependence: Secondary | ICD-10-CM

## 2015-05-04 DIAGNOSIS — I739 Peripheral vascular disease, unspecified: Secondary | ICD-10-CM

## 2015-05-04 DIAGNOSIS — Z95828 Presence of other vascular implants and grafts: Secondary | ICD-10-CM | POA: Diagnosis not present

## 2015-05-04 DIAGNOSIS — I70219 Atherosclerosis of native arteries of extremities with intermittent claudication, unspecified extremity: Secondary | ICD-10-CM

## 2015-05-04 DIAGNOSIS — I6523 Occlusion and stenosis of bilateral carotid arteries: Secondary | ICD-10-CM | POA: Diagnosis not present

## 2015-05-04 NOTE — Progress Notes (Signed)
[facility] [place] HISTORY AND PHYSICAL -PAD  History of Present Illness Lance Dillon is a 75 y.o. male  patient of Dr. Kellie Simmering who is s/p right common femoral to below knee popliteal artery bypass graft 07/19/2011 with right external iliac and common femoral artery endarterectomies. He returns today for follow up. He has less claudication symptoms than he had before revascularization. After walking about 1/4 mile both calves are moderately painful, but the more he walks the pain lessens, relieved with rest. He walks a couple of miles 2-3 days/week. He has had injections in both hips which helped his pain. 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 Pt denies non healing wounds. Pt denies any history of stroke or TIA. His cardiologist, Dr. Ellyn Hack, checks his renal artery stenosis.  He has urinary incontinence and wears a clamp on his penis.  The patient reports New Medical or Surgical History: pt states that Dr. 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 Pt states Dr. Estanislado Pandy found a right iliac stenosis, and is hesitant to perform an catheter based procedure via his groin to address his brain aneurysm. Wife states Dr. Gwenlyn Found is monitoring his renal artery stenosis, pt states his blood pressure has remained good.   Pt Diabetic: No Pt smoker: former smoker, quit in 2004  Pt meds include: Statin :Yes Betablocker: Yes ASA: Yes Other anticoagulants/antiplatelets: no   Past Medical History  Diagnosis Date  . Hypercholesterolemia   . Hypertension   . Macular degeneration   . Hx of radiation therapy 03/22/11 to 05/08/11    PSA recurrent carcinoma of prostate  . Heart murmur     No evidence of valvular lesion noted on Echo 01/2010  . Peripheral vascular disease [facility]) June 2013    Bilateral SFA stenosis:  Status post right femoropopliteal bypass - Dr. Kellie Simmering  . Stones in the urinary tract   . Renal artery stenosis (HCC)     70% left  . Arthritis   . Macular degeneration of both eyes   . Blind right eye   . Colon polyps 2004, 07, 14  . Cataract   . Prostate cancer ([place]) 2014  . 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 Atherosclerosis of aortic bifurcation and common iliac arteries [facility]) February 2017     Noted on abdominal/renal Dopplers    Social History Social History  Substance Use Topics  . Smoking status: Former Smoker -- 3.00 packs/day for 20 years    Types: Cigarettes    Quit date: 07/01/2002  . Smokeless tobacco: Never Used  . Alcohol Use: No    Family History Family History  Problem Relation Age of Onset  . Prostate cancer Brother 62  . Heart disease Brother     Before age 10  . Hypertension Brother   . Heart attack Brother   . Colon cancer Neg Hx   . Aneurysm Father   . Heart disease Father   . Hypertension Father   . Heart disease Mother     Before age 64  . Hypertension  Mother   . Heart attack Mother   . Heart disease Sister   . Hypertension Sister   . Heart attack Sister     Past Surgical History  Procedure Laterality Date  . Prostatectomy retropubic radical  07/15/2002    with nerve sparing, Gleason 3+4=7  . Penile prosthesis implant    . Penile prosthesis  removal    . Prostate biopsy    . Hernia repair      right side,lft  . Femoral-popliteal bypass graft  07/19/2011    Procedure: BYPASS GRAFT FEMORAL-POPLITEAL ARTERY;  Surgeon: Mal Misty, MD;  Location: [facility] OR;  Service: Vascular;  Laterality: Right;  Right Femoral - Popliteal  Bypss with saphenous vein  . Intraoperative  arteriogram  07/19/2011    Procedure: INTRA OPERATIVE ARTERIOGRAM;  Surgeon: Mal Misty, MD;  Location: [place];  Service: Vascular;  Laterality: Right;  . Pr vein bypass graft,aorto-fem-pop  07/19/11    Right  Fem/Pop BPG  . Colonoscopy w/ biopsies      multiple   . Transthoracic echocardiogram  January    Normal EF and overall function  . Nm myoview ltd  June 2013    Normal EF, very small area of basal-mid inferior ischemia; LOW RISK  . Reclast  July 25, 2013  . Abdominal aortagram N/A 06/20/2011    Procedure: ABDOMINAL Maxcine Ham;  Surgeon: Serafina Mitchell, MD;  Location: [facility] CATH LAB;  Service: Cardiovascular;  Laterality: N/A;  . Renal duplex  07/01/2012    Left renal artery 60-99% diameter reduction  . Cardiovascular stress test  07/06/2011    Evidence of mild ischemia in Basal Inferior and Mid Inferior regions, no significant wall motion abnormalities noted.  . Transthoracic echocardiogram  02/23/2010    EF [id number], lv systolic function normal  . Eye surgery Right Aug. 2016    Cataract    Allergies  Allergen Reactions  . Avelox [Moxifloxacin Hcl In Nacl] Diarrhea    diarrhea    Current Outpatient Prescriptions  Medication Sig Dispense Refill  . acetaminophen (TYLENOL) 325 MG tablet Take 650 mg by mouth every 6 (six) hours as needed. For pain    . amLODipine (NORVASC) 10 MG tablet Take 10 mg by mouth daily.    Marland Kitchen aspirin 81 MG tablet Take 81 mg by mouth daily.    . Calcium Carbonate-Vitamin D (CALTRATE 600+D PO) Take 1 tablet by mouth 2 (two) times daily.     Marland Kitchen docusate sodium (COLACE) 100 MG capsule Take 100 mg by mouth at bedtime.    . fluticasone (FLONASE) 50 MCG/ACT nasal spray Place 2 sprays into both nostrils as needed (for nasal congestion).     Marland Kitchen ibuprofen (ADVIL,MOTRIN) 200 MG tablet Take 200 mg by mouth every 6 (six) hours as needed for mild pain.     Marland Kitchen latanoprost (XALATAN) 0.005 % ophthalmic solution daily. One drop Left eye every day    . losartan-hydrochlorothiazide  (HYZAAR) 100-12.5 MG per tablet Take 1 tablet by mouth daily.    . LUTEIN PO Take 1 tablet by mouth daily.    . metoprolol succinate (TOPROL-XL) 25 MG 24 hr tablet Take 1 tablet (25 mg total) by mouth daily. 90 tablet 3  . Omega-3 Fatty Acids (FISH OIL) 1200 MG CAPS Take 3,600 mg by mouth daily.    . polyethylene glycol (MIRALAX / GLYCOLAX) packet Take 17 g by mouth at bedtime.    . rosuvastatin (CRESTOR) 10 MG tablet Take 10 mg by mouth  daily.     No current facility-administered medications for this visit.    ROS: See HPI for pertinent positives and negatives.   Physical Examination  Filed Vitals:   05/04/15 1059  BP: 124/76  Pulse: 70  Temp: 97.9 F (36.6 C)  TempSrc: Oral  Resp: 16  Height: 5\' 8"  (1.727 m)  Weight: 168 lb (76.204 kg)  SpO2: 95%   Body mass index is 25.55 kg/(m^2).  General: A&O x 3, WDWN. Gait: normal Eyes: PERRLA. Pulmonary: CTAB, without wheezes , rales or rhonchi. Cardiac: regular Rythm , without detected murmur.     Carotid Bruits Right Left   Negative Negative  Aorta is not palpable. Radial pulses: are 2+ palpable and =   VASCULAR EXAM: Extremities without ischemic changes  without Gangrene; without open wounds. No edema. Toes are pink with brisk capillary refill.      LE Pulses Right Left   FEMORAL 3+ palpable Not palpable, with large area of ecchymosis and 1.5. Cm round palpable solid mass, likely hmatoma    POPLITEAL not palpable  not palpable   POSTERIOR TIBIAL biphasic by Doppler   monophasic by Doppler    DORSALIS PEDIS  ANTERIOR TIBIAL monophasic by Doppler  monophasic by Doppler    Abdomen: soft, NT, no palapble masses. Clamp in place on penis. Skin: no rashes, no  ulcers. Musculoskeletal: no muscle wasting or atrophy. Neurologic: A&O X 3; Appropriate Affect ; SENSATION: normal; MOTOR FUNCTION: moving all extremities equally, motor strength 5/5 throughout. Speech is fluent/normal. CN 2-12 intact.                 Non-Invasive Vascular Imaging: DATE: 05/04/2015 ABI: RIGHT: 0.57 (0.67, 09/29/14), Waveforms: mono;  LEFT: 0.52 (0.55), Waveforms: mono    04/27/15 IR Angio: Approximately 9.7 mm in diameter versus a 20 mm in length large posterior communicating artery region aneurysm as described above. This has a increased in size since the previous study of 2015.  Occluded right internal carotid artery at the bulb.  Occluded left internal carotid artery at the petrous cavernous junction, with partial reconstitution of the left internal carotid artery in the distal cavernous and supraclinoid segments from collaterals from the internal maxillary artery, the anterior meningeal branch of the middle meningeal artery, and from the artery of the foramen of rotundum.  Occlusion at the left common femoral artery just above the inguinal ligament, with patency of the left profunda femoris artery which is associated with a 50-70% narrowing at its origin.    ASSESSMENT: Lance Dillon is a 75 y.o. male who is s/p right common femoral to below knee popliteal artery bypass graft 07/19/2011 with right external iliac and common femoral artery endarterectomies.  Pt has mild bilateral calf claudication, no signs of ischemia in his feet/legs.  04/29/15 ABI's indicate moderate bilateral arterial occlusive disease.  He was a 3 PPD smoker for many years until 2004 when he quit. Fortunately he does not have DM.  At a previous visit, I discussed with Dr. Kellie Simmering that the ultrasound technician finds that the last several arterial Duplex of the right leg indicate occlusion of the graft with development of collateral perfusion. Will therefore check only ABI's  on his follow up visit.   He takes a daily ASA and a statin.  Dr. Kellie Simmering reviewed the 04/27/15 IR angio report, spoke with pt and wife and examined pt. Pt asked Dr. Kellie Simmering to communicate with Dr. Estanislado Pandy re the contemplated intervention of his brain aneurysm and the occlusion  at the left common femoral artery just above the inguinal ligament noted on the IR angiogram. Dr. Kellie Simmering told pt and wife that he would communicate with Dr. Kathee Delton re this.  PLAN:  Based on the patient's vascular studies and examination, pt will return to clinic in 6 months with ABI's and carotid duplex.  I discussed in depth with the patient the nature of atherosclerosis, and emphasized the importance of maximal medical management including strict control of blood pressure, blood glucose, and lipid levels, obtaining regular exercise, and continued cessation of smoking.  The patient is aware that without maximal medical management the underlying atherosclerotic disease process will progress, limiting the benefit of any interventions.  The patient was given information about PAD including signs, symptoms, treatment, what symptoms should prompt the patient to seek immediate medical care, and risk reduction measures to take.  Clemon Chambers, RN, MSN, FNP-C [facility] Phone: [phone]  Clinic MD: Kellie Simmering  05/04/2015 11:17 AM

## 2015-05-04 NOTE — Patient Instructions (Signed)
Peripheral Vascular Disease Peripheral vascular disease (PVD) is a disease of the blood vessels that are not part of your heart and brain. A simple term for PVD is poor circulation. In most cases, PVD narrows the blood vessels that carry blood from your heart to the rest of your body. This can result in a decreased supply of blood to your arms, legs, and internal organs, like your stomach or kidneys. However, it most often affects a person's lower legs and feet. There are two types of PVD.  Organic PVD. This is the more common type. It is caused by damage to the structure of blood vessels.  Functional PVD. This is caused by conditions that make blood vessels contract and tighten (spasm). Without treatment, PVD tends to get worse over time. PVD can also lead to acute ischemic limb. This is when an arm or limb suddenly has trouble getting enough blood. This is a medical emergency. CAUSES Each type of PVD has many different causes. The most common cause of PVD is buildup of a fatty material (plaque) inside of your arteries (atherosclerosis). Small amounts of plaque can break off from the walls of the blood vessels and become lodged in a smaller artery. This blocks blood flow and can cause acute ischemic limb. Other common causes of PVD include:  Blood clots that form inside of blood vessels.  Injuries to blood vessels.  Diseases that cause inflammation of blood vessels or cause blood vessel spasms.  Health behaviors and health history that increase your risk of developing PVD. RISK FACTORS  You may have a greater risk of PVD if you:  Have a family history of PVD.  Have certain medical conditions, including:  High cholesterol.  Diabetes.  High blood pressure (hypertension).  Coronary heart disease.  Past problems with blood clots.  Past injury, such as burns or a broken bone. These may have damaged blood vessels in your limbs.  Buerger disease. This is caused by inflamed blood  vessels in your hands and feet.  Some forms of arthritis.  Rare birth defects that affect the arteries in your legs.  Use tobacco.  Do not get enough exercise.  Are obese.  Are age 50 or older. SIGNS AND SYMPTOMS  PVD may cause many different symptoms. Your symptoms depend on what part of your body is not getting enough blood. Some common signs and symptoms include:  Cramps in your lower legs. This may be a symptom of poor leg circulation (claudication).  Pain and weakness in your legs while you are physically active that goes away when you rest (intermittent claudication).  Leg pain when at rest.  Leg numbness, tingling, or weakness.  Coldness in a leg or foot, especially when compared with the other leg.  Skin or hair changes. These can include:  Hair loss.  Shiny skin.  Pale or bluish skin.  Thick toenails.  Inability to get or maintain an erection (erectile dysfunction). People with PVD are more prone to developing ulcers and sores on their toes, feet, or legs. These may take longer than normal to heal. DIAGNOSIS Your health care provider may diagnose PVD from your signs and symptoms. The health care provider will also do a physical exam. You may have tests to find out what is causing your PVD and determine its severity. Tests may include:  Blood pressure recordings from your arms and legs and measurements of the strength of your pulses (pulse volume recordings).  Imaging studies using sound waves to take pictures of   the blood flow through your blood vessels (Doppler ultrasound).  Injecting a dye into your blood vessels before having imaging studies using:  X-rays (angiogram or arteriogram).  Computer-generated X-rays (CT angiogram).  A powerful electromagnetic field and a computer (magnetic resonance angiogram or MRA). TREATMENT Treatment for PVD depends on the cause of your condition and the severity of your symptoms. It also depends on your age. Underlying  causes need to be treated and controlled. These include long-lasting (chronic) conditions, such as diabetes, high cholesterol, and high blood pressure. You may need to first try making lifestyle changes and taking medicines. Surgery may be needed if these do not work. Lifestyle changes may include:  Quitting smoking.  Exercising regularly.  Following a low-fat, low-cholesterol diet. Medicines may include:  Blood thinners to prevent blood clots.  Medicines to improve blood flow.  Medicines to improve your blood cholesterol levels. Surgical procedures may include:  A procedure that uses an inflated balloon to open a blocked artery and improve blood flow (angioplasty).  A procedure to put in a tube (stent) to keep a blocked artery open (stent implant).  Surgery to reroute blood flow around a blocked artery (peripheral bypass surgery).  Surgery to remove dead tissue from an infected wound on the affected limb.  Amputation. This is surgical removal of the affected limb. This may be necessary in cases of acute ischemic limb that are not improved through medical or surgical treatments. HOME CARE INSTRUCTIONS  Take medicines only as directed by your health care provider.  Do not use any tobacco products, including cigarettes, chewing tobacco, or electronic cigarettes. If you need help quitting, ask your health care provider.  Lose weight if you are overweight, and maintain a healthy weight as directed by your health care provider.  Eat a diet that is low in fat and cholesterol. If you need help, ask your health care provider.  Exercise regularly. Ask your health care provider to suggest some good activities for you.  Use compression stockings or other mechanical devices as directed by your health care provider.  Take good care of your feet.  Wear comfortable shoes that fit well.  Check your feet often for any cuts or sores. SEEK MEDICAL CARE IF:  You have cramps in your legs  while walking.  You have leg pain when you are at rest.  You have coldness in a leg or foot.  Your skin changes.  You have erectile dysfunction.  You have cuts or sores on your feet that are not healing. SEEK IMMEDIATE MEDICAL CARE IF:  Your arm or leg turns cold and blue.  Your arms or legs become red, warm, swollen, painful, or numb.  You have chest pain or trouble breathing.  You suddenly have weakness in your face, arm, or leg.  You become very confused or lose the ability to speak.  You suddenly have a very bad headache or lose your vision.   This information is not intended to replace advice given to you by your health care provider. Make sure you discuss any questions you have with your health care provider.   Document Released: 02/24/2004 Document Revised: 02/06/2014 Document Reviewed: 06/26/2013 Elsevier Interactive Patient Education 2016 Elsevier Inc.    Stroke Prevention Some medical conditions and behaviors are associated with an increased chance of having a stroke. You may prevent a stroke by making healthy choices and managing medical conditions. HOW CAN I REDUCE MY RISK OF HAVING A STROKE?   Stay physically active. Get at   least 30 minutes of activity on most or all days.  Do not smoke. It may also be helpful to avoid exposure to secondhand smoke.  Limit alcohol use. Moderate alcohol use is considered to be:  No more than 2 drinks per day for men.  No more than 1 drink per day for nonpregnant women.  Eat healthy foods. This involves:  Eating 5 or more servings of fruits and vegetables a day.  Making dietary changes that address high blood pressure (hypertension), high cholesterol, diabetes, or obesity.  Manage your cholesterol levels.  Making food choices that are high in fiber and low in saturated fat, trans fat, and cholesterol may control cholesterol levels.  Take any prescribed medicines to control cholesterol as directed by your health care  provider.  Manage your diabetes.  Controlling your carbohydrate and sugar intake is recommended to manage diabetes.  Take any prescribed medicines to control diabetes as directed by your health care provider.  Control your hypertension.  Making food choices that are low in salt (sodium), saturated fat, trans fat, and cholesterol is recommended to manage hypertension.  Ask your health care provider if you need treatment to lower your blood pressure. Take any prescribed medicines to control hypertension as directed by your health care provider.  If you are 18-39 years of age, have your blood pressure checked every 3-5 years. If you are 40 years of age or older, have your blood pressure checked every year.  Maintain a healthy weight.  Reducing calorie intake and making food choices that are low in sodium, saturated fat, trans fat, and cholesterol are recommended to manage weight.  Stop drug abuse.  Avoid taking birth control pills.  Talk to your health care provider about the risks of taking birth control pills if you are over 35 years old, smoke, get migraines, or have ever had a blood clot.  Get evaluated for sleep disorders (sleep apnea).  Talk to your health care provider about getting a sleep evaluation if you snore a lot or have excessive sleepiness.  Take medicines only as directed by your health care provider.  For some people, aspirin or blood thinners (anticoagulants) are helpful in reducing the risk of forming abnormal blood clots that can lead to stroke. If you have the irregular heart rhythm of atrial fibrillation, you should be on a blood thinner unless there is a good reason you cannot take them.  Understand all your medicine instructions.  Make sure that other conditions (such as anemia or atherosclerosis) are addressed. SEEK IMMEDIATE MEDICAL CARE IF:   You have sudden weakness or numbness of the face, arm, or leg, especially on one side of the body.  Your face  or eyelid droops to one side.  You have sudden confusion.  You have trouble speaking (aphasia) or understanding.  You have sudden trouble seeing in one or both eyes.  You have sudden trouble walking.  You have dizziness.  You have a loss of balance or coordination.  You have a sudden, severe headache with no known cause.  You have new chest pain or an irregular heartbeat. Any of these symptoms may represent a serious problem that is an emergency. Do not wait to see if the symptoms will go away. Get medical help at once. Call your local emergency services (911 in [place] Do not drive yourself to the hospital.   This information is not intended to replace advice given to you by your health care provider. Make sure you discuss any questions   you have with your health care provider.   Document Released: 02/24/2004 Document Revised: 02/06/2014 Document Reviewed: 07/19/2012 Elsevier Interactive Patient Education 2016 Elsevier Inc.  

## 2015-05-13 ENCOUNTER — Ambulatory Visit (HOSPITAL_COMMUNITY)
Admission: RE | Admit: 2015-05-13 | Discharge: 2015-05-13 | Disposition: A | Discharge status: Home / Self Care | Payer: Medicare Other | Source: Ambulatory Visit | Attending: Interventional Radiology | Admitting: Interventional Radiology

## 2015-05-13 DIAGNOSIS — I671 Cerebral aneurysm, nonruptured: Secondary | ICD-10-CM

## 2015-05-18 ENCOUNTER — Other Ambulatory Visit (HOSPITAL_COMMUNITY): Payer: Self-pay | Admitting: Interventional Radiology

## 2015-05-19 NOTE — Addendum Note (Signed)
Addended by: Reola Calkins on: 05/19/2015 09:15 AM   Modules accepted: Orders

## 2015-05-27 ENCOUNTER — Other Ambulatory Visit (HOSPITAL_COMMUNITY): Payer: Self-pay | Admitting: Interventional Radiology

## 2015-05-27 DIAGNOSIS — I671 Cerebral aneurysm, nonruptured: Secondary | ICD-10-CM

## 2015-06-06 ENCOUNTER — Emergency Department (HOSPITAL_COMMUNITY): Payer: Medicare Other

## 2015-06-06 ENCOUNTER — Inpatient Hospital Stay (HOSPITAL_COMMUNITY)
Admission: EM | Admit: 2015-06-06 | Discharge: 2015-06-10 | DRG: 871 | Disposition: A | Discharge status: Home / Self Care | Payer: Medicare Other | Attending: Internal Medicine | Admitting: Internal Medicine

## 2015-06-06 ENCOUNTER — Encounter (HOSPITAL_COMMUNITY): Payer: Self-pay

## 2015-06-06 DIAGNOSIS — I10 Essential (primary) hypertension: Secondary | ICD-10-CM | POA: Diagnosis present

## 2015-06-06 DIAGNOSIS — Z8249 Family history of ischemic heart disease and other diseases of the circulatory system: Secondary | ICD-10-CM

## 2015-06-06 DIAGNOSIS — N136 Pyonephrosis: Secondary | ICD-10-CM | POA: Diagnosis present

## 2015-06-06 DIAGNOSIS — R740 Nonspecific elevation of levels of transaminase and lactic acid dehydrogenase [LDH]: Secondary | ICD-10-CM | POA: Diagnosis present

## 2015-06-06 DIAGNOSIS — Z8546 Personal history of malignant neoplasm of prostate: Secondary | ICD-10-CM | POA: Diagnosis not present

## 2015-06-06 DIAGNOSIS — E7800 Pure hypercholesterolemia, unspecified: Secondary | ICD-10-CM | POA: Diagnosis present

## 2015-06-06 DIAGNOSIS — E872 Acidosis: Secondary | ICD-10-CM | POA: Diagnosis not present

## 2015-06-06 DIAGNOSIS — H3530 Unspecified macular degeneration: Secondary | ICD-10-CM | POA: Diagnosis present

## 2015-06-06 DIAGNOSIS — N1330 Unspecified hydronephrosis: Secondary | ICD-10-CM | POA: Diagnosis not present

## 2015-06-06 DIAGNOSIS — N202 Calculus of kidney with calculus of ureter: Secondary | ICD-10-CM | POA: Diagnosis present

## 2015-06-06 DIAGNOSIS — Z8042 Family history of malignant neoplasm of prostate: Secondary | ICD-10-CM

## 2015-06-06 DIAGNOSIS — Z881 Allergy status to other antibiotic agents status: Secondary | ICD-10-CM | POA: Diagnosis not present

## 2015-06-06 DIAGNOSIS — I739 Peripheral vascular disease, unspecified: Secondary | ICD-10-CM | POA: Diagnosis present

## 2015-06-06 DIAGNOSIS — Z7982 Long term (current) use of aspirin: Secondary | ICD-10-CM

## 2015-06-06 DIAGNOSIS — R1032 Left lower quadrant pain: Secondary | ICD-10-CM | POA: Diagnosis present

## 2015-06-06 DIAGNOSIS — N132 Hydronephrosis with renal and ureteral calculous obstruction: Secondary | ICD-10-CM | POA: Diagnosis not present

## 2015-06-06 DIAGNOSIS — E785 Hyperlipidemia, unspecified: Secondary | ICD-10-CM | POA: Diagnosis present

## 2015-06-06 DIAGNOSIS — J9811 Atelectasis: Secondary | ICD-10-CM | POA: Diagnosis present

## 2015-06-06 DIAGNOSIS — J189 Pneumonia, unspecified organism: Secondary | ICD-10-CM | POA: Diagnosis present

## 2015-06-06 DIAGNOSIS — I129 Hypertensive chronic kidney disease with stage 1 through stage 4 chronic kidney disease, or unspecified chronic kidney disease: Secondary | ICD-10-CM | POA: Diagnosis present

## 2015-06-06 DIAGNOSIS — R197 Diarrhea, unspecified: Secondary | ICD-10-CM | POA: Diagnosis not present

## 2015-06-06 DIAGNOSIS — N182 Chronic kidney disease, stage 2 (mild): Secondary | ICD-10-CM | POA: Diagnosis present

## 2015-06-06 DIAGNOSIS — A4159 Other Gram-negative sepsis: Principal | ICD-10-CM | POA: Diagnosis present

## 2015-06-06 DIAGNOSIS — N201 Calculus of ureter: Secondary | ICD-10-CM

## 2015-06-06 DIAGNOSIS — Z87891 Personal history of nicotine dependence: Secondary | ICD-10-CM | POA: Diagnosis not present

## 2015-06-06 DIAGNOSIS — I701 Atherosclerosis of renal artery: Secondary | ICD-10-CM | POA: Diagnosis present

## 2015-06-06 DIAGNOSIS — D696 Thrombocytopenia, unspecified: Secondary | ICD-10-CM | POA: Diagnosis present

## 2015-06-06 DIAGNOSIS — N179 Acute kidney failure, unspecified: Secondary | ICD-10-CM | POA: Diagnosis present

## 2015-06-06 DIAGNOSIS — Z86010 Personal history of colonic polyps: Secondary | ICD-10-CM

## 2015-06-06 DIAGNOSIS — Z923 Personal history of irradiation: Secondary | ICD-10-CM

## 2015-06-06 DIAGNOSIS — N12 Tubulo-interstitial nephritis, not specified as acute or chronic: Secondary | ICD-10-CM

## 2015-06-06 DIAGNOSIS — Z79899 Other long term (current) drug therapy: Secondary | ICD-10-CM

## 2015-06-06 DIAGNOSIS — I671 Cerebral aneurysm, nonruptured: Secondary | ICD-10-CM | POA: Diagnosis present

## 2015-06-06 DIAGNOSIS — E860 Dehydration: Secondary | ICD-10-CM | POA: Diagnosis present

## 2015-06-06 DIAGNOSIS — H5441 Blindness, right eye, normal vision left eye: Secondary | ICD-10-CM | POA: Diagnosis present

## 2015-06-06 DIAGNOSIS — R7989 Other specified abnormal findings of blood chemistry: Secondary | ICD-10-CM

## 2015-06-06 LAB — CBC
HEMATOCRIT: 40 % (ref 39.0–52.0)
Hemoglobin: 13.5 g/dL (ref 13.0–17.0)
MCH: 32.1 pg (ref 26.0–34.0)
MCHC: 33.8 g/dL (ref 30.0–36.0)
MCV: 95 fL (ref 78.0–100.0)
PLATELETS: 144 10*3/uL — AB (ref 150–400)
RBC: 4.21 MIL/uL — ABNORMAL LOW (ref 4.22–5.81)
RDW: 13.2 % (ref 11.5–15.5)
WBC: 15.7 10*3/uL — AB (ref 4.0–10.5)

## 2015-06-06 LAB — URINALYSIS, ROUTINE W REFLEX MICROSCOPIC
Bilirubin Urine: NEGATIVE
Glucose, UA: NEGATIVE mg/dL
KETONES UR: NEGATIVE mg/dL
NITRITE: NEGATIVE
PROTEIN: 100 mg/dL — AB
Specific Gravity, Urine: 1.022 (ref 1.005–1.030)
pH: 5.5 (ref 5.0–8.0)

## 2015-06-06 LAB — LIPASE, BLOOD: Lipase: 15 U/L (ref 11–51)

## 2015-06-06 LAB — COMPREHENSIVE METABOLIC PANEL
ALBUMIN: 3.4 g/dL — AB (ref 3.5–5.0)
ALK PHOS: 44 U/L (ref 38–126)
ALT: 36 U/L (ref 17–63)
AST: 45 U/L — ABNORMAL HIGH (ref 15–41)
Anion gap: 15 (ref 5–15)
BILIRUBIN TOTAL: 2.6 mg/dL — AB (ref 0.3–1.2)
BUN: 43 mg/dL — AB (ref 6–20)
CO2: 22 mmol/L (ref 22–32)
CREATININE: 2.22 mg/dL — AB (ref 0.61–1.24)
Calcium: 9 mg/dL (ref 8.9–10.3)
Chloride: 98 mmol/L — ABNORMAL LOW (ref 101–111)
GFR calc Af Amer: 32 mL/min — ABNORMAL LOW (ref >60)
GFR, EST NON AFRICAN AMERICAN: 27 mL/min — AB (ref >60)
Glucose, Bld: 160 mg/dL — ABNORMAL HIGH (ref 65–99)
POTASSIUM: 3.8 mmol/L (ref 3.5–5.1)
Sodium: 135 mmol/L (ref 135–145)
TOTAL PROTEIN: 6.9 g/dL (ref 6.5–8.1)

## 2015-06-06 LAB — URINE MICROSCOPIC-ADD ON

## 2015-06-06 LAB — I-STAT CG4 LACTIC ACID, ED
LACTIC ACID, VENOUS: 1.83 mmol/L (ref 0.5–2.0)
Lactic Acid, Venous: 3.18 mmol/L (ref 0.5–2.0)
Lactic Acid, Venous: 1.44 mmol/L (ref 0.5–2.0)

## 2015-06-06 MED ORDER — POLYETHYLENE GLYCOL 3350 17 G PO PACK
17.0000 g | PACK | Freq: Every day | ORAL | Status: DC
  Filled 2015-06-06 (×2): qty 1

## 2015-06-06 MED ORDER — VANCOMYCIN HCL IN DEXTROSE 750-5 MG/150ML-% IV SOLN
750.0000 mg | INTRAVENOUS | Status: DC
  Filled 2015-06-06: qty 150

## 2015-06-06 MED ORDER — ONDANSETRON HCL 4 MG/2ML IJ SOLN
4.0000 mg | Freq: Once | INTRAMUSCULAR | Status: AC
  Administered 2015-06-06: 4 mg via INTRAVENOUS
  Filled 2015-06-06: qty 2

## 2015-06-06 MED ORDER — ROSUVASTATIN CALCIUM 10 MG PO TABS
10.0000 mg | ORAL_TABLET | Freq: Every day | ORAL | Status: DC
  Administered 2015-06-06 – 2015-06-09 (×4): 10 mg via ORAL
  Filled 2015-06-06 (×4): qty 1

## 2015-06-06 MED ORDER — MORPHINE SULFATE (PF) 4 MG/ML IV SOLN
4.0000 mg | Freq: Once | INTRAVENOUS | Status: AC
  Administered 2015-06-06: 4 mg via INTRAVENOUS
  Filled 2015-06-06: qty 1

## 2015-06-06 MED ORDER — SODIUM CHLORIDE 0.9 % IV BOLUS (SEPSIS)
250.0000 mL | Freq: Once | INTRAVENOUS | Status: AC
  Administered 2015-06-06: 250 mL via INTRAVENOUS

## 2015-06-06 MED ORDER — DEXTROSE 5 % IV SOLN
1.0000 g | Freq: Once | INTRAVENOUS | Status: AC
  Administered 2015-06-06: 1 g via INTRAVENOUS
  Filled 2015-06-06: qty 10

## 2015-06-06 MED ORDER — PROMETHAZINE HCL 25 MG PO TABS
12.5000 mg | ORAL_TABLET | Freq: Four times a day (QID) | ORAL | Status: DC | PRN
Start: 2015-06-06 — End: 2015-06-09
  Administered 2015-06-06 – 2015-06-07 (×3): 12.5 mg via ORAL
  Filled 2015-06-06 (×3): qty 1

## 2015-06-06 MED ORDER — ACETAMINOPHEN 325 MG PO TABS
650.0000 mg | ORAL_TABLET | Freq: Four times a day (QID) | ORAL | Status: DC | PRN
Start: 2015-06-06 — End: 2015-06-10
  Administered 2015-06-06 – 2015-06-08 (×6): 650 mg via ORAL
  Filled 2015-06-06 (×7): qty 2

## 2015-06-06 MED ORDER — POLYETHYLENE GLYCOL 3350 17 G PO PACK: 17.0000 g | PACK | Freq: Every day | ORAL | Status: DC | PRN

## 2015-06-06 MED ORDER — VANCOMYCIN HCL IN DEXTROSE 1-5 GM/200ML-% IV SOLN
1000.0000 mg | Freq: Once | INTRAVENOUS | Status: AC
  Administered 2015-06-06: 1000 mg via INTRAVENOUS
  Filled 2015-06-06: qty 200

## 2015-06-06 MED ORDER — SODIUM CHLORIDE 0.9 % IV SOLN
INTRAVENOUS | Status: DC
  Administered 2015-06-06 – 2015-06-10 (×6): via INTRAVENOUS

## 2015-06-06 MED ORDER — FLUTICASONE PROPIONATE 50 MCG/ACT NA SUSP: 2.0000 | NASAL | Status: DC | PRN

## 2015-06-06 MED ORDER — ONDANSETRON HCL 4 MG/2ML IJ SOLN
4.0000 mg | Freq: Four times a day (QID) | INTRAMUSCULAR | Status: DC | PRN
  Administered 2015-06-08 – 2015-06-09 (×2): 4 mg via INTRAVENOUS
  Filled 2015-06-06 (×2): qty 2

## 2015-06-06 MED ORDER — TRAZODONE HCL 50 MG PO TABS: 25.0000 mg | ORAL_TABLET | Freq: Every evening | ORAL | Status: DC | PRN

## 2015-06-06 MED ORDER — DIATRIZOATE MEGLUMINE & SODIUM 66-10 % PO SOLN
ORAL | Status: AC
  Filled 2015-06-06: qty 30

## 2015-06-06 MED ORDER — FENTANYL CITRATE (PF) 100 MCG/2ML IJ SOLN
50.0000 ug | Freq: Once | INTRAMUSCULAR | Status: AC
  Administered 2015-06-06: 50 ug via INTRAVENOUS
  Filled 2015-06-06: qty 2

## 2015-06-06 MED ORDER — SODIUM CHLORIDE 0.9 % IV BOLUS (SEPSIS)
1000.0000 mL | Freq: Once | INTRAVENOUS | Status: AC
  Administered 2015-06-06: 1000 mL via INTRAVENOUS

## 2015-06-06 MED ORDER — PIPERACILLIN-TAZOBACTAM 3.375 G IVPB
3.3750 g | Freq: Three times a day (TID) | INTRAVENOUS | Status: DC
  Administered 2015-06-06 – 2015-06-08 (×5): 3.375 g via INTRAVENOUS
  Filled 2015-06-06 (×5): qty 50

## 2015-06-06 MED ORDER — ACETAMINOPHEN 650 MG RE SUPP
650.0000 mg | Freq: Four times a day (QID) | RECTAL | Status: DC | PRN
  Administered 2015-06-08: 650 mg via RECTAL
  Filled 2015-06-06: qty 1

## 2015-06-06 MED ORDER — ASPIRIN 81 MG PO CHEW
81.0000 mg | CHEWABLE_TABLET | Freq: Every day | ORAL | Status: DC
  Administered 2015-06-06 – 2015-06-10 (×5): 81 mg via ORAL
  Filled 2015-06-06 (×5): qty 1

## 2015-06-06 MED ORDER — SODIUM CHLORIDE 0.9 % IV BOLUS (SEPSIS)
500.0000 mL | Freq: Once | INTRAVENOUS | Status: AC
  Administered 2015-06-06: 500 mL via INTRAVENOUS

## 2015-06-06 MED ORDER — METOPROLOL SUCCINATE ER 25 MG PO TB24
25.0000 mg | ORAL_TABLET | Freq: Every day | ORAL | Status: DC
  Administered 2015-06-06 – 2015-06-10 (×5): 25 mg via ORAL
  Filled 2015-06-06 (×5): qty 1

## 2015-06-06 MED ORDER — DOCUSATE SODIUM 100 MG PO CAPS
100.0000 mg | ORAL_CAPSULE | Freq: Every day | ORAL | Status: DC
  Administered 2015-06-06: 100 mg via ORAL
  Filled 2015-06-06: qty 1

## 2015-06-06 MED ORDER — LATANOPROST 0.005 % OP SOLN
1.0000 [drp] | Freq: Every day | OPHTHALMIC | Status: DC
  Administered 2015-06-06 – 2015-06-09 (×4): 1 [drp] via OPHTHALMIC
  Filled 2015-06-06: qty 2.5

## 2015-06-06 MED ORDER — CETYLPYRIDINIUM CHLORIDE 0.05 % MT LIQD
7.0000 mL | Freq: Two times a day (BID) | OROMUCOSAL | Status: DC
  Administered 2015-06-07 – 2015-06-10 (×5): 7 mL via OROMUCOSAL

## 2015-06-06 MED ORDER — BISACODYL 5 MG PO TBEC: 5.0000 mg | DELAYED_RELEASE_TABLET | Freq: Every day | ORAL | Status: DC | PRN

## 2015-06-06 MED ORDER — HYDROMORPHONE HCL 1 MG/ML IJ SOLN
0.5000 mg | INTRAMUSCULAR | Status: DC | PRN
  Administered 2015-06-07: 0.5 mg via INTRAVENOUS
  Filled 2015-06-06: qty 1

## 2015-06-06 MED ORDER — ONDANSETRON HCL 4 MG PO TABS: 4.0000 mg | ORAL_TABLET | Freq: Four times a day (QID) | ORAL | Status: DC | PRN

## 2015-06-06 NOTE — Progress Notes (Signed)
Pharmacy Antibiotic Note  Lance Dillon is a 75 y.o. male admitted on 06/06/2015 with pyelonephritis, possible CAP with SIRS on presentation.  Pharmacy has been consulted for Vancomycin and Zosyn dosing.  Pt also with AKI, left obstructing renal stone.  CT also showed Small left pleural effusion with underlying consolidation within the left lower lobe and lingula which may represent atelectasis or infection.  Plan: Vancomycin 750 mg q24h.  1g dose given in ED. Zosyn 3.375g IV q8h (4 hour infusion time).  F/u SCr, culture results, and de-escalation of antibiotics.  Height: 5\' 9"  (175.3 cm) Weight: 166 lb 7.2 oz (75.5 kg) IBW/kg (Calculated) : 70.7  Temp (24hrs), Avg:98.7 F (37.1 C), Min:98.2 F (36.8 C), Max:99.4 F (37.4 C)   Recent Labs Lab 06/06/15 0937 06/06/15 0953 06/06/15 1232 06/06/15 1711  WBC 15.7*  --   --   --   CREATININE 2.22*  --   --   --   LATICACIDVEN  --  3.18* 1.83 1.44    Estimated Creatinine Clearance: 29.2 mL/min (by C-G formula based on Cr of 2.22).    Allergies  Allergen Reactions  . Avelox [Moxifloxacin Hcl In Nacl] Diarrhea    diarrhea    Antimicrobials this admission: 5/7 Ceftriaxone x 1 5/7 Vancomycin >>  5/7 Zosyn >>   Dose adjustments this admission: -  Microbiology results: 5/7 UCx: sent  Sputum: ordered strep pneumo ur ag: ordered  Thank you for allowing pharmacy to be a part of this patient's care.  Hershal Coria 06/06/2015 6:45 PM

## 2015-06-06 NOTE — ED Notes (Signed)
[facility] at bedside, received report. Vital signs stable, patient alert and oriented x4.

## 2015-06-06 NOTE — Progress Notes (Signed)
Patient transferred from [facility], oriented patient/family to room/unit. Patient denies pain, Dr. Rito Ehrlich notified of patient arrival, family have question, paged Dr. Nyoka Cowden again and he spoke to patient's daughter, reported off to night shift nurse to F/U with plan of care.

## 2015-06-06 NOTE — ED Provider Notes (Signed)
CSN: [id number]     Arrival date & time 06/06/15  0909 History   First MD Initiated Contact with Patient 06/06/15 [phone]     Chief Complaint  Patient presents with  . Abdominal Pain     (Consider location/radiation/quality/duration/timing/severity/associated sxs/prior Treatment) HPI Patient presents with left lower quadrant abdominal pain starting yesterday morning. States it was abrupt onset. Associated with nausea and 2 episodes of vomiting. States the pain radiates to the left flank. He has had dysuria and difficulty urinating since onset. No definite fever but admits to chills. Has had soft stool but no diarrhea or constipation. Denies blood in stool, vomit or urine. Denies scrotal swelling or pain. Past Medical History  Diagnosis Date  . Hypercholesterolemia   . Hypertension   . Macular degeneration   . Hx of radiation therapy 03/22/11 to 05/08/11    PSA recurrent carcinoma of prostate  . Heart murmur     No evidence of valvular lesion noted on Echo 01/2010  . Peripheral vascular disease [facility]) June 2013    Bilateral SFA stenosis: Status post right femoropopliteal bypass - Dr. Kellie Simmering  . Stones in the urinary tract   . Renal artery stenosis (HCC)     70% left  . Arthritis   . Macular degeneration of both eyes   . Blind right eye   . Colon polyps 2004, 07, 14  . Cataract   . Prostate cancer ([place]) 2014  . 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 Atherosclerosis of aortic bifurcation and common iliac arteries [facility]) February 2017     Noted on abdominal/renal Dopplers   Past Surgical History  Procedure Laterality Date  . Prostatectomy retropubic radical  07/15/2002    with nerve sparing, Gleason 3+4=7  . Penile  prosthesis implant    . Penile prosthesis  removal    . Prostate biopsy    . Hernia repair      right side,lft  . Femoral-popliteal bypass graft  07/19/2011    Procedure: BYPASS GRAFT FEMORAL-POPLITEAL ARTERY;  Surgeon: Mal Misty, MD;  Location: [facility] OR;  Service: Vascular;  Laterality: Right;  Right Femoral - Popliteal  Bypss with saphenous vein  . Intraoperative arteriogram  07/19/2011    Procedure: INTRA OPERATIVE ARTERIOGRAM;  Surgeon: Mal Misty, MD;  Location: [facility];  Service: Vascular;  Laterality: Right;  . Pr vein bypass graft,aorto-fem-pop  07/19/11    Right  Fem/Pop BPG  . Colonoscopy w/ biopsies      multiple   . Transthoracic echocardiogram  January    Normal EF and overall function  . Nm myoview ltd  June 2013    Normal EF, very small area of basal-mid inferior ischemia; LOW RISK  . Reclast  July 25, 2013  . Abdominal aortagram N/A 06/20/2011    Procedure: ABDOMINAL Maxcine Ham;  Surgeon: Serafina Mitchell, MD;  Location: [facility] CATH LAB;  Service: Cardiovascular;  Laterality: N/A;  . Renal duplex  07/01/2012    Left renal artery 60-99% diameter reduction  . Cardiovascular stress test  07/06/2011    Evidence of mild ischemia in Basal Inferior and Mid Inferior regions, no significant wall motion abnormalities noted.  . Transthoracic echocardiogram  02/23/2010    EF >55%, lv  systolic function normal  . Eye surgery Right Aug. 2016    Cataract   Family History  Problem Relation Age of Onset  . Prostate cancer Brother 77  . Heart disease Brother     Before age 18  . Hypertension Brother   . Heart attack Brother   . Colon cancer Neg Hx   . Aneurysm Father   . Heart disease Father   . Hypertension Father   . Heart disease Mother     Before age 33  . Hypertension Mother   . Heart attack Mother   . Heart disease Sister   . Hypertension Sister   . Heart attack Sister    Social History  Substance Use Topics  . Smoking status: Former Smoker -- 3.00 packs/day for 20 years     Types: Cigarettes    Quit date: 07/01/2002  . Smokeless tobacco: Never Used  . Alcohol Use: No    Review of Systems  Constitutional: Positive for chills and fatigue. Negative for fever.  Respiratory: Negative for cough and shortness of breath.   Cardiovascular: Negative for chest pain.  Gastrointestinal: Positive for nausea, abdominal pain and abdominal distention. Negative for diarrhea, constipation and blood in stool.  Genitourinary: Positive for dysuria, flank pain and difficulty urinating. Negative for frequency, hematuria, penile swelling, scrotal swelling, penile pain and testicular pain.  Musculoskeletal: Positive for back pain. Negative for neck pain and neck stiffness.  Skin: Negative for rash and wound.  Neurological: Negative for dizziness, weakness, light-headedness, numbness and headaches.  All other systems reviewed and are negative.     Allergies  Avelox  Home Medications   Prior to Admission medications   Medication Sig Start Date End Date Taking? Authorizing Provider  acetaminophen (TYLENOL) 325 MG tablet Take 650 mg by mouth every 6 (six) hours as needed. For pain   Yes Historical Provider, MD  amLODipine (NORVASC) 10 MG tablet Take 10 mg by mouth at bedtime.    Yes Historical Provider, MD  aspirin 81 MG tablet Take 81 mg by mouth daily.   Yes Historical Provider, MD  Calcium Carbonate-Vitamin D (CALTRATE 600+D PO) Take 1 tablet by mouth 2 (two) times daily.    Yes Historical Provider, MD  docusate sodium (COLACE) 100 MG capsule Take 100 mg by mouth at bedtime.   Yes Historical Provider, MD  ibuprofen (ADVIL,MOTRIN) 200 MG tablet Take 200 mg by mouth every 6 (six) hours as needed for mild pain.    Yes Historical Provider, MD  latanoprost (XALATAN) 0.005 % ophthalmic solution Place 1 drop into the left eye at bedtime.  09/18/13  Yes Historical Provider, MD  losartan-hydrochlorothiazide (HYZAAR) 100-12.5 MG per tablet Take 1 tablet by mouth at bedtime.    Yes  Historical Provider, MD  LUTEIN PO Take 1 tablet by mouth daily.   Yes Historical Provider, MD  metoprolol succinate (TOPROL-XL) 25 MG 24 hr tablet Take 1 tablet (25 mg total) by mouth daily. 02/19/15  Yes Lorretta Harp, MD  Omega-3 Fatty Acids (FISH OIL) 1200 MG CAPS Take 3,600 mg by mouth daily.   Yes Historical Provider, MD  polyethylene glycol (MIRALAX / GLYCOLAX) packet Take 17 g by mouth at bedtime.   Yes Historical Provider, MD  rosuvastatin (CRESTOR) 10 MG tablet Take 10 mg by mouth at bedtime.    Yes Historical Provider, MD  fluticasone (FLONASE) 50 MCG/ACT nasal spray Place 2 sprays into both nostrils as needed (for nasal congestion).  09/02/12   Historical Provider, MD  BP 110/68 mmHg  Pulse 91  Temp(Src) 98.2 F (36.8 C) (Oral)  Resp 18  SpO2 92% Physical Exam  Constitutional: He is oriented to person, place, and time. He appears well-developed and well-nourished. No distress.  No acute distress.  HENT:  Head: Normocephalic and atraumatic.  Mouth/Throat: Oropharynx is clear and moist.  Eyes: EOM are normal. Pupils are equal, round, and reactive to light.  Neck: Normal range of motion. Neck supple.  Cardiovascular: Normal rate and regular rhythm.  Exam reveals no gallop and no friction rub.   No murmur heard. Pulmonary/Chest: Effort normal and breath sounds normal. No respiratory distress. He has no wheezes. He has no rales. He exhibits no tenderness.  Abdominal: Soft. Bowel sounds are normal. He exhibits distension (diffusely distended and tympanic abdomen). He exhibits no mass. There is tenderness (mild left lower quadrant tenderness to palpation.). There is no rebound and no guarding.  Musculoskeletal: Normal range of motion. He exhibits tenderness. He exhibits no edema.  Left CVA tenderness with percussion. No lower extremity swelling or asymmetry. Distal pulses are equal and intact.  Neurological: He is alert and oriented to person, place, and time.  Was all extremities  without deficit. Sensation is fully intact.  Skin: Skin is warm and dry. No rash noted. No erythema.  Psychiatric: He has a normal mood and affect. His behavior is normal.  Nursing note and vitals reviewed.   ED Course  Procedures (including critical care time) Labs Review Labs Reviewed  COMPREHENSIVE METABOLIC PANEL - Abnormal; Notable for the following:    Chloride 98 (*)    Glucose, Bld 160 (*)    BUN 43 (*)    Creatinine, Ser 2.22 (*)    Albumin 3.4 (*)    AST 45 (*)    Total Bilirubin 2.6 (*)    GFR calc non Af Amer 27 (*)    GFR calc Af Amer 32 (*)    All other components within normal limits  CBC - Abnormal; Notable for the following:    WBC 15.7 (*)    RBC 4.21 (*)    Platelets 144 (*)    All other components within normal limits  URINALYSIS, ROUTINE W REFLEX MICROSCOPIC (NOT AT [facility]) - Abnormal; Notable for the following:    Color, Urine AMBER (*)    APPearance TURBID (*)    Hgb urine dipstick MODERATE (*)    Protein, ur 100 (*)    Leukocytes, UA MODERATE (*)    All other components within normal limits  URINE MICROSCOPIC-ADD ON - Abnormal; Notable for the following:    Squamous Epithelial / LPF 0-5 (*)    Bacteria, UA FEW (*)    All other components within normal limits  I-STAT CG4 LACTIC ACID, ED - Abnormal; Notable for the following:    Lactic Acid, Venous 3.18 (*)    All other components within normal limits  URINE CULTURE  CULTURE, EXPECTORATED SPUTUM-ASSESSMENT  GRAM STAIN  LIPASE, BLOOD  HIV ANTIBODY (ROUTINE TESTING)  STREP PNEUMONIAE URINARY ANTIGEN  I-STAT CG4 LACTIC ACID, ED  I-STAT CG4 LACTIC ACID, ED    Imaging Review Ct Abdomen Pelvis Wo Contrast  06/06/2015  CLINICAL DATA:  Patient with acute left lower quadrant pain, nausea and vomiting. EXAM: CT ABDOMEN AND PELVIS WITHOUT CONTRAST TECHNIQUE: Multidetector CT imaging of the abdomen and pelvis was performed following the standard protocol without IV contrast. COMPARISON:  CT abdomen pelvis  06/04/2014 FINDINGS: Lower chest: Small hiatal hernia. Normal heart size. Small left  pleural effusion. Ground-glass and consolidative opacities within the left lower lobe and lingula. Hepatobiliary: Liver is normal in size and contour. Liver is diffusely low in attenuation compatible with steatosis. There is a stone within the gallbladder lumen. No gallbladder wall thickening. Pancreas: Unremarkable Spleen: Unremarkable Adrenals/Urinary Tract: The adrenal glands are normal. There is a 6 mm obstructing stone within the distal left ureter (image 60; series 201) resulting in moderate left hydroureteronephrosis. The left kidney is enlarged and there is perinephric fat stranding and fluid. The right kidney is normal in size. Multiple 2- 3 mm stones within the right kidney. No right ureterolithiasis. Urinary bladder is decompressed. Stomach/Bowel: No abnormal bowel wall thickening or evidence for bowel obstruction. Normal morphology to the stomach. Vascular/Lymphatic: Tortuous yet normal caliber abdominal aorta with peripheral calcified atherosclerotic plaque. Ectatic right iliac artery. No retroperitoneal lymphadenopathy. Other: Bilateral fat containing inguinal hernias. Musculoskeletal: Lumbar spine degenerative changes. No aggressive or acute appearing osseous lesions. IMPRESSION: There is an obstructing 6 mm stone within the distal left ureter resulting in moderate left hydroureteronephrosis, enlargement of the left kidney and left perinephric fat stranding and fluid. Additional nonobstructing stones within the inferior pole of the right kidney measuring approximately 2-3 mm. Small left pleural effusion with underlying consolidation within the left lower lobe and lingula which may represent atelectasis or infection. Hepatic steatosis. Electronically Signed   By: Lovey Newcomer M.D.   On: 06/06/2015 14:36   I have personally reviewed and evaluated these images and lab results as part of my medical decision-making.    EKG Interpretation None      MDM   Final diagnoses:  None   States his pain has improved. Initial elevation in lactic acid has corrected after IV fluids. Remains afebrile with normal heart rate. CT with evidence of infected stone. Discussed with Dr. Nyoka Cowden. Recommends IV antibiotics and transfer to [facility] long to be admitted by hospitalist. Discussed with hospitalist will see patient in the emergency department and arrange transfer.     Julianne Rice, MD 06/06/15 [phone]

## 2015-06-06 NOTE — Consult Note (Signed)
Urology Consult   Physician requesting consult: Maren Reamer, MD   Reason for consult: Nephrolithiasis  History of Present Illness: Lance Dillon is a 75 y.o. with a complicated PMH including significant PVD who come in with flank pain. The patient has passed stones in the past and was found on CT to have a 6 mm left ureteral stone. The patient was afebrile in the ED with a hemoconcentrated serum sample with a white count of 15. He had a UA consistent with nephrolithiasis with moderate leuks and nitrite negative. The patient's creatinine also elevated above 2, likely from dehydration given his normal appearing contralateral kidney.   Past Medical History  Diagnosis Date  . Hypercholesterolemia   . Hypertension   . Macular degeneration   . Hx of radiation therapy 03/22/11 to 05/08/11    PSA recurrent carcinoma of prostate  . Heart murmur     No evidence of valvular lesion noted on Echo 01/2010  . Peripheral vascular disease [facility]) June 2013    Bilateral SFA stenosis: Status post right femoropopliteal bypass - Dr. Kellie Simmering  . Stones in the urinary tract   . Renal artery stenosis (HCC)     70% left  . Arthritis   . Macular degeneration of both eyes   . Blind right eye   . Colon polyps 2004, 07, 14  . Cataract   . Prostate cancer ([place]) 2014  . 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 Atherosclerosis of aortic bifurcation and common iliac arteries [facility]) February 2017     Noted on abdominal/renal Dopplers    Past Surgical History  Procedure Laterality Date  . Prostatectomy retropubic radical  07/15/2002    with nerve sparing, Gleason 3+4=7  . Penile prosthesis implant    . Penile prosthesis  removal    . Prostate  biopsy    . Hernia repair      right side,lft  . Femoral-popliteal bypass graft  07/19/2011    Procedure: BYPASS GRAFT FEMORAL-POPLITEAL ARTERY;  Surgeon: Mal Misty, MD;  Location: [facility] OR;  Service: Vascular;  Laterality: Right;  Right Femoral - Popliteal  Bypss with saphenous vein  . Intraoperative arteriogram  07/19/2011    Procedure: INTRA OPERATIVE ARTERIOGRAM;  Surgeon: Mal Misty, MD;  Location: [place];  Service: Vascular;  Laterality: Right;  . Pr vein bypass graft,aorto-fem-pop  07/19/11    Right  Fem/Pop BPG  . Colonoscopy w/ biopsies      multiple   . Transthoracic echocardiogram  January    Normal EF and overall function  . Nm myoview ltd  June 2013    Normal EF, very small area of basal-mid inferior ischemia; LOW RISK  . Reclast  July 25, 2013  . Abdominal aortagram N/A 06/20/2011    Procedure: ABDOMINAL Maxcine Ham;  Surgeon: Serafina Mitchell, MD;  Location: [facility] CATH LAB;  Service: Cardiovascular;  Laterality: N/A;  . Renal duplex  07/01/2012    Left renal artery 60-99% diameter reduction  . Cardiovascular stress test  07/06/2011    Evidence of mild ischemia in Basal Inferior and Mid Inferior regions, no significant wall motion abnormalities noted.  . Transthoracic echocardiogram  02/23/2010    EF 123456, lv systolic function normal  .  Eye surgery Right Aug. 2016    Cataract    Current Hospital Medications:  Home Meds:    Medication List    ASK your doctor about these medications        acetaminophen 325 MG tablet  Commonly known as:  TYLENOL  Take 650 mg by mouth every 6 (six) hours as needed. For pain     amLODipine 10 MG tablet  Commonly known as:  NORVASC  Take 10 mg by mouth at bedtime.     aspirin 81 MG tablet  Take 81 mg by mouth daily.     CALTRATE 600+D PO  Take 1 tablet by mouth 2 (two) times daily.     docusate sodium 100 MG capsule  Commonly known as:  COLACE  Take 100 mg by mouth at bedtime.     Fish Oil 1200 MG Caps  Take 3,600 mg by mouth  daily.     fluticasone 50 MCG/ACT nasal spray  Commonly known as:  FLONASE  Place 2 sprays into both nostrils as needed (for nasal congestion).     ibuprofen 200 MG tablet  Commonly known as:  ADVIL,MOTRIN  Take 200 mg by mouth every 6 (six) hours as needed for mild pain.     latanoprost 0.005 % ophthalmic solution  Commonly known as:  XALATAN  Place 1 drop into the left eye at bedtime.     losartan-hydrochlorothiazide 100-12.5 MG tablet  Commonly known as:  HYZAAR  Take 1 tablet by mouth at bedtime.     LUTEIN PO  Take 1 tablet by mouth daily.     metoprolol succinate 25 MG 24 hr tablet  Commonly known as:  TOPROL-XL  Take 1 tablet (25 mg total) by mouth daily.     polyethylene glycol packet  Commonly known as:  MIRALAX / GLYCOLAX  Take 17 g by mouth at bedtime.     rosuvastatin 10 MG tablet  Commonly known as:  CRESTOR  Take 10 mg by mouth at bedtime.        Scheduled Meds: . aspirin  81 mg Oral Daily  . diatrizoate meglumine-sodium      . docusate sodium  100 mg Oral QHS  . latanoprost  1 drop Left Eye QHS  . metoprolol succinate  25 mg Oral Daily  . piperacillin-tazobactam (ZOSYN)  IV  3.375 g Intravenous Q8H  . [START ON 06/07/2015] polyethylene glycol  17 g Oral QHS  . rosuvastatin  10 mg Oral QHS  . [START ON 06/07/2015] vancomycin  750 mg Intravenous Q24H   Continuous Infusions: . sodium chloride 75 mL/hr at 06/06/15 1844   PRN Meds:.acetaminophen **OR** acetaminophen, bisacodyl, fluticasone, HYDROmorphone (DILAUDID) injection, ondansetron **OR** ondansetron (ZOFRAN) IV, polyethylene glycol, promethazine, traZODone  Allergies:  Allergies  Allergen Reactions  . Avelox [Moxifloxacin Hcl In Nacl] Diarrhea    diarrhea    Family History  Problem Relation Age of Onset  . Prostate cancer Brother 51  . Heart disease Brother     Before age 4  . Hypertension Brother   . Heart attack Brother   . Colon cancer Neg Hx   . Aneurysm Father   . Heart disease  Father   . Hypertension Father   . Heart disease Mother     Before age 63  . Hypertension Mother   . Heart attack Mother   . Heart disease Sister   . Hypertension Sister   . Heart attack Sister     Social History:  reports  that he quit smoking about 12 years ago. His smoking use included Cigarettes. He has a 60 pack-year smoking history. He has never used smokeless tobacco. He reports that he does not drink alcohol or use illicit drugs.  ROS: A complete review of systems was performed.  All systems are negative except for pertinent findings as noted.  Physical Exam:  Vital signs in last 24 hours: Temp:  [98.2 F (36.8 C)-99.4 F (37.4 C)] 98.4 F (36.9 C) (05/07 1823) Pulse Rate:  [80-95] 95 (05/07 1823) Resp:  [12-22] 22 (05/07 1823) BP: (93-121)/(50-80) 121/80 mmHg (05/07 1823) SpO2:  [92 %-99 %] 98 % (05/07 1823) Weight:  [75.5 kg (166 lb 7.2 oz)] 75.5 kg (166 lb 7.2 oz) (05/07 1823)   Laboratory Data:   Recent Labs  06/06/15 0937  WBC 15.7*  HGB 13.5  HCT 40.0  PLT 144*     Recent Labs  06/06/15 0937  NA 135  K 3.8  CL 98*  GLUCOSE 160*  BUN 43*  CALCIUM 9.0  CREATININE 2.22*     Results for orders placed or performed during the hospital encounter of 06/06/15 (from the past 24 hour(s))  Lipase, blood     Status: None   Collection Time: 06/06/15  9:37 AM  Result Value Ref Range   Lipase 15 11 - 51 U/L  Comprehensive metabolic panel     Status: Abnormal   Collection Time: 06/06/15  9:37 AM  Result Value Ref Range   Sodium 135 135 - 145 mmol/L   Potassium 3.8 3.5 - 5.1 mmol/L   Chloride 98 (L) 101 - 111 mmol/L   CO2 22 22 - 32 mmol/L   Glucose, Bld 160 (H) 65 - 99 mg/dL   BUN 43 (H) 6 - 20 mg/dL   Creatinine, Ser 2.22 (H) 0.61 - 1.24 mg/dL   Calcium 9.0 8.9 - 10.3 mg/dL   Total Protein 6.9 6.5 - 8.1 g/dL   Albumin 3.4 (L) 3.5 - 5.0 g/dL   AST 45 (H) 15 - 41 U/L   ALT 36 17 - 63 U/L   Alkaline Phosphatase 44 38 - 126 U/L   Total Bilirubin 2.6  (H) 0.3 - 1.2 mg/dL   GFR calc non Af Amer 27 (L) >60 mL/min   GFR calc Af Amer 32 (L) >60 mL/min   Anion gap 15 5 - 15  CBC     Status: Abnormal   Collection Time: 06/06/15  9:37 AM  Result Value Ref Range   WBC 15.7 (H) 4.0 - 10.5 K/uL   RBC 4.21 (L) 4.22 - 5.81 MIL/uL   Hemoglobin 13.5 13.0 - 17.0 g/dL   HCT 40.0 39.0 - 52.0 %   MCV 95.0 78.0 - 100.0 fL   MCH 32.1 26.0 - 34.0 pg   MCHC 33.8 30.0 - 36.0 g/dL   RDW 13.2 11.5 - 15.5 %   Platelets 144 (L) 150 - 400 K/uL  I-Stat CG4 Lactic Acid, ED     Status: Abnormal   Collection Time: 06/06/15  9:53 AM  Result Value Ref Range   Lactic Acid, Venous 3.18 (HH) 0.5 - 2.0 mmol/L   Comment NOTIFIED PHYSICIAN   Urinalysis, Routine w reflex microscopic     Status: Abnormal   Collection Time: 06/06/15 11:42 AM  Result Value Ref Range   Color, Urine AMBER (A) YELLOW   APPearance TURBID (A) CLEAR   Specific Gravity, Urine 1.022 1.005 - 1.030   pH 5.5 5.0 - 8.0  Glucose, UA NEGATIVE NEGATIVE mg/dL   Hgb urine dipstick MODERATE (A) NEGATIVE   Bilirubin Urine NEGATIVE NEGATIVE   Ketones, ur NEGATIVE NEGATIVE mg/dL   Protein, ur 100 (A) NEGATIVE mg/dL   Nitrite NEGATIVE NEGATIVE   Leukocytes, UA MODERATE (A) NEGATIVE  Urine microscopic-add on     Status: Abnormal   Collection Time: 06/06/15 11:42 AM  Result Value Ref Range   Squamous Epithelial / LPF 0-5 (A) NONE SEEN   WBC, UA TOO NUMEROUS TO COUNT 0 - 5 WBC/hpf   RBC / HPF 6-30 0 - 5 RBC/hpf   Bacteria, UA FEW (A) NONE SEEN  I-Stat CG4 Lactic Acid, ED     Status: None   Collection Time: 06/06/15 12:32 PM  Result Value Ref Range   Lactic Acid, Venous 1.83 0.5 - 2.0 mmol/L  I-Stat CG4 Lactic Acid, ED     Status: None   Collection Time: 06/06/15  5:11 PM  Result Value Ref Range   Lactic Acid, Venous 1.44 0.5 - 2.0 mmol/L   No results found for this or any previous visit (from the past 240 hour(s)).  Renal Function:  Recent Labs  06/06/15 0937  CREATININE 2.22*    Estimated Creatinine Clearance: 29.2 mL/min (by C-G formula based on Cr of 2.22).  Radiologic Imaging: Ct Abdomen Pelvis Wo Contrast  06/06/2015  CLINICAL DATA:  Patient with acute left lower quadrant pain, nausea and vomiting. EXAM: CT ABDOMEN AND PELVIS WITHOUT CONTRAST TECHNIQUE: Multidetector CT imaging of the abdomen and pelvis was performed following the standard protocol without IV contrast. COMPARISON:  CT abdomen pelvis 06/04/2014 FINDINGS: Lower chest: Small hiatal hernia. Normal heart size. Small left pleural effusion. Ground-glass and consolidative opacities within the left lower lobe and lingula. Hepatobiliary: Liver is normal in size and contour. Liver is diffusely low in attenuation compatible with steatosis. There is a stone within the gallbladder lumen. No gallbladder wall thickening. Pancreas: Unremarkable Spleen: Unremarkable Adrenals/Urinary Tract: The adrenal glands are normal. There is a 6 mm obstructing stone within the distal left ureter (image 60; series 201) resulting in moderate left hydroureteronephrosis. The left kidney is enlarged and there is perinephric fat stranding and fluid. The right kidney is normal in size. Multiple 2- 3 mm stones within the right kidney. No right ureterolithiasis. Urinary bladder is decompressed. Stomach/Bowel: No abnormal bowel wall thickening or evidence for bowel obstruction. Normal morphology to the stomach. Vascular/Lymphatic: Tortuous yet normal caliber abdominal aorta with peripheral calcified atherosclerotic plaque. Ectatic right iliac artery. No retroperitoneal lymphadenopathy. Other: Bilateral fat containing inguinal hernias. Musculoskeletal: Lumbar spine degenerative changes. No aggressive or acute appearing osseous lesions. IMPRESSION: There is an obstructing 6 mm stone within the distal left ureter resulting in moderate left hydroureteronephrosis, enlargement of the left kidney and left perinephric fat stranding and fluid. Additional  nonobstructing stones within the inferior pole of the right kidney measuring approximately 2-3 mm. Small left pleural effusion with underlying consolidation within the left lower lobe and lingula which may represent atelectasis or infection. Hepatic steatosis. Electronically Signed   By: Lovey Newcomer M.D.   On: 06/06/2015 14:36    I independently reviewed the above imaging studies.  Impression/Recommendation 75 y.o. male with left ureteral stone. Without fever the evidence for an infected kidney stone is light. The patient's UA is consistent with inflammation and not infection. The patient's white count is likely a systemic response to the stone and dehydration. The patient's most pressing concern appears to be his AKI, which given his precarious  health related to PVD is worrisome and we would like to see this trend toward baseline with hydration. If the patient spikes a fever >101 we would consider moving forward with emergent stent to ensure drainage of his kidney, but if he is afebrile and his labs improve he may be able to have this stone treated as an outpatient.   I performed a history and physical examination of the patient and discussed his management with the resident.  I reviewed the resident's note and agree with the documented findings and plan of care      Christell Faith 06/06/2015, 7:38 PM

## 2015-06-06 NOTE — ED Notes (Signed)
Pt has finished drinking oral CT contrast. Johnny with CT informed.

## 2015-06-06 NOTE — H&P (Addendum)
Attending MD note  Patient was seen, examined, treatment plan was discussed with the  Advance Practice Provider.  I have personally reviewed the clinical findings, lab, EKG, imaging studies and management of this patient in detail.I have also reviewed the orders written for this patient which were under my direction. I agree with the documentation, as recorded by the Advance Practice Provider.   Lance Dillon is a 75 y.o. male with a Past Medical History of htn/hld/pvd who presents with intermittent LLQ abd pain radiating to left flank x 1 wk.  Mild dysuria w/o hematuria. Wife at bedside.    vss 110/68  Hr 91  92%ra Nad. Aaox3, pleasant cv rrr Lungs ctab abd hypoactive, mild ttp llq, no g/r. Le no c/c/e  A/p 1. Left 2mm obstructing renal stone w/ pyelonephritis and hydronephrosis, w/ SIRS on presention. - pt improved w/ aggressive ivf, given rocephin and vanco in ed, we changed rocephin to zosyn to broaded  abx spectrum - trx to [facility] for [facility] intervention - keep npo - pain control  2. Small left pleaural effusion w/ underlying consolidation w/in LLL & lingula, Possible CAP vs atelectasis (from splinting) - pancx, covered w/ zosyn/vanco for now as well. - repeat cxr soon for clearance  3. aki - w/ ckd, continue ivf.  Maren Reamer, MD, [facility] Pager [phone], please call On-call for After hours. [facility]   History and Physical    Lance Dillon [id number] DOB: 10-30-1940 DOA: 06/06/2015  Referring MD/NP/PA: EDP - Julianne Rice, MD PCP: Thressa Sheller, MD Outpatient Specialists: Patient Care Team: Thressa Sheller, MD as PCP - General (Internal Medicine) Raynelle Bring, MD (Urology) Hurman Horn, MD (Ophthalmology) Sharyne Peach, MD as Consulting Physician (Ophthalmology) Leonie Man, MD as Consulting Physician (Cardiology)  Patient coming from:  Home  Chief Complaint: Abdominal pain  HPI: Lance Dillon is a  75 y.o. male with medical history significant for,but not necessarily limited to,  hypertension, hyperlipidemia, and peripheral vascular disease. Patient presented to the emergency department with a one-week history of intermittent left lower abdominal pain radiating around to left flank area. Pain quite severe at times . He has mild dysuria without hematuria. No documented fevers but has been having chills. She has nauseated but has not vomited. CT scan shows ureteral stone and left hydronephrosis with perinephric stranding. Patient and wife have been fighting what they call a bug. Patient coughing for about one month . Cough productive at times . Former smoker, quit many years ago  no shortness of breath or chest pain  No other medical complaints at this time  ED Course:  Afebrile, blood pressure soft 93 /50. White count 15.7 Creatinine 2.2  Lactic acid 3.8 CTscan -He has no obstructing stone in the distal left ureter with moderate left hydroureter ureter and cirrhosis and left perinephric fat stranding and fluid.  Review of Systems: As per HPI, otherwise 10 point review of systems negative.    Past Medical History  Diagnosis Date  . Hypercholesterolemia   . Hypertension   . Macular degeneration   . Hx of radiation therapy 03/22/11 to 05/08/11    PSA recurrent carcinoma of prostate  . Heart murmur     No evidence of valvular lesion noted on Echo 01/2010  . Peripheral vascular disease [facility]) June 2013    Bilateral SFA stenosis: Status post right femoropopliteal bypass - Dr. Kellie Simmering  . Stones in the urinary tract   . Renal artery  stenosis (HCC)     70% left  . Arthritis   . Macular degeneration of both eyes   . Blind right eye   . Colon polyps 2004, 07, 14  . Cataract   . Prostate cancer ([place]) 2014  . 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 Atherosclerosis of aortic bifurcation and common iliac arteries [facility]) February 2017     Noted on abdominal/renal Dopplers    Past Surgical History  Procedure Laterality Date  . Prostatectomy retropubic radical  07/15/2002    with nerve sparing, Gleason 3+4=7  . Penile prosthesis implant    . Penile prosthesis  removal    . Prostate biopsy    . Hernia repair      right side,lft  . Femoral-popliteal bypass graft  07/19/2011    Procedure: BYPASS GRAFT FEMORAL-POPLITEAL ARTERY;  Surgeon: Mal Misty, MD;  Location: [facility] OR;  Service: Vascular;  Laterality: Right;  Right Femoral - Popliteal  Bypss with saphenous vein  . Intraoperative arteriogram  07/19/2011    Procedure: INTRA OPERATIVE ARTERIOGRAM;  Surgeon: Mal Misty, MD;  Location: [place];  Service: Vascular;  Laterality: Right;  . Pr vein bypass graft,aorto-fem-pop  07/19/11    Right  Fem/Pop BPG  . Colonoscopy w/ biopsies      multiple   . Transthoracic echocardiogram  January    Normal EF and overall function  . Nm myoview ltd  June 2013    Normal EF, very small area of basal-mid inferior ischemia; LOW RISK  . Reclast  July 25, 2013  . Abdominal aortagram N/A 06/20/2011    Procedure: ABDOMINAL Maxcine Ham;  Surgeon: Serafina Mitchell, MD;  Location: [facility] CATH LAB;  Service: Cardiovascular;  Laterality: N/A;  . Renal duplex  07/01/2012    Left renal artery 60-99% diameter reduction  . Cardiovascular stress test  07/06/2011    Evidence of mild ischemia in Basal Inferior and Mid Inferior regions, no significant wall motion abnormalities noted.  . Transthoracic echocardiogram  02/23/2010    EF 123456, lv systolic function normal  . Eye surgery Right Aug. 2016    Cataract     reports that he quit smoking about 12 years ago. His smoking use included Cigarettes. He has a 60 pack-year smoking history. He has never used  smokeless tobacco. He reports that he does not drink alcohol or use illicit drugs.  Allergies  Allergen Reactions  . Avelox [Moxifloxacin Hcl In Nacl] Diarrhea    diarrhea    Family History  Problem Relation Age of Onset  . Prostate cancer Brother 38  . Heart disease Brother     Before age 72  . Hypertension Brother   . Heart attack Brother   . Colon cancer Neg Hx   . Aneurysm Father   . Heart disease Father   . Hypertension Father   . Heart disease Mother     Before age 62  . Hypertension Mother   . Heart attack Mother   . Heart disease Sister   . Hypertension Sister   . Heart attack Sister    . Prior to Admission medications   Medication Sig Start Date End Date Taking? Authorizing Provider  acetaminophen (TYLENOL) 325 MG tablet  Take 650 mg by mouth every 6 (six) hours as needed. For pain   Yes Historical Provider, MD  amLODipine (NORVASC) 10 MG tablet Take 10 mg by mouth at bedtime.    Yes Historical Provider, MD  aspirin 81 MG tablet Take 81 mg by mouth daily.   Yes Historical Provider, MD  Calcium Carbonate-Vitamin D (CALTRATE 600+D PO) Take 1 tablet by mouth 2 (two) times daily.    Yes Historical Provider, MD  docusate sodium (COLACE) 100 MG capsule Take 100 mg by mouth at bedtime.   Yes Historical Provider, MD  ibuprofen (ADVIL,MOTRIN) 200 MG tablet Take 200 mg by mouth every 6 (six) hours as needed for mild pain.    Yes Historical Provider, MD  latanoprost (XALATAN) 0.005 % ophthalmic solution Place 1 drop into the left eye at bedtime.  09/18/13  Yes Historical Provider, MD  losartan-hydrochlorothiazide (HYZAAR) 100-12.5 MG per tablet Take 1 tablet by mouth at bedtime.    Yes Historical Provider, MD  LUTEIN PO Take 1 tablet by mouth daily.   Yes Historical Provider, MD  metoprolol succinate (TOPROL-XL) 25 MG 24 hr tablet Take 1 tablet (25 mg total) by mouth daily. 02/19/15  Yes Lorretta Harp, MD  Omega-3 Fatty Acids (FISH OIL) 1200 MG CAPS Take 3,600 mg by mouth daily.    Yes Historical Provider, MD  polyethylene glycol (MIRALAX / GLYCOLAX) packet Take 17 g by mouth at bedtime.   Yes Historical Provider, MD  rosuvastatin (CRESTOR) 10 MG tablet Take 10 mg by mouth at bedtime.    Yes Historical Provider, MD  fluticasone (FLONASE) 50 MCG/ACT nasal spray Place 2 sprays into both nostrils as needed (for nasal congestion).  09/02/12   Historical Provider, MD    Physical Exam: Filed Vitals:   06/06/15 1245 06/06/15 1330 06/06/15 1500 06/06/15 1537  BP: 101/67 106/69 108/68 93/50  Pulse: 82 81 87 89  Temp:      TempSrc:      Resp:    18  SpO2: 96% 95% 97% 99%      Constitutional:  NAD, calm, comfortable Filed Vitals:   06/06/15 1245 06/06/15 1330 06/06/15 1500 06/06/15 1537  BP: 101/67 106/69 108/68 93/50  Pulse: 82 81 87 89  Temp:      TempSrc:      Resp:    18  SpO2: 96% 95% 97% 99%   Eyes: PER, lids and conjunctivae normal ENMT: Mucous membranes are moist. Posterior pharynx clear of any exudate or lesions.Normal dentition.  Neck: normal, supple, no masses Respiratory: clear to auscultation bilaterally, no wheezing, no crackles. Normal respiratory effort. No accessory muscle use.  Cardiovascular: Regular rate and rhythm, + mumur, no rubs / gallops. No extremity edema. 2+ pedal pulses. No carotid bruits.  Abdomen:  no masses palpated. No hepatomegaly. Bowel sounds positive. Moderate LLQ tenderness Musculoskeletal: no clubbing / cyanosis. No joint deformity upper and lower extremities. Good ROM, no contractures. Normal muscle tone.  Skin: no rashes, lesions, ulcers. No induration Neurologic: CN 2-12 grossly intact. Sensation intact, Strength 5/5 in all 4.  Psychiatric: Normal judgment and insight. Alert and oriented x 3. Normal mood.   Labs on Admission: I have personally reviewed following labs and imaging studies  CBC:  Recent Labs Lab 06/06/15 0937  WBC 15.7*  HGB 13.5  HCT 40.0  MCV 95.0  PLT 123456*   Basic Metabolic Panel:  Recent  Labs Lab 06/06/15 0937  NA 135  K 3.8  CL 98*  CO2 22  GLUCOSE 160*  BUN 43*  CREATININE 2.22*  CALCIUM 9.0   GFR: CrCl cannot be calculated (Unknown ideal weight.). Liver Function Tests:  Recent Labs Lab 06/06/15 0937  AST 45*  ALT 36  ALKPHOS 44  BILITOT 2.6*  PROT 6.9  ALBUMIN 3.4*    Recent Labs Lab 06/06/15 0937  LIPASE 15   Urine analysis:    Component Value Date/Time   COLORURINE AMBER* 06/06/2015 1142   APPEARANCEUR TURBID* 06/06/2015 1142   LABSPEC 1.022 06/06/2015 1142   PHURINE 5.5 06/06/2015 1142   GLUCOSEU NEGATIVE 06/06/2015 1142   HGBUR MODERATE* 06/06/2015 1142   BILIRUBINUR NEGATIVE 06/06/2015 1142   KETONESUR NEGATIVE 06/06/2015 1142   PROTEINUR 100* 06/06/2015 1142   UROBILINOGEN 1.0 07/13/2011 0844   NITRITE NEGATIVE 06/06/2015 1142   LEUKOCYTESUR MODERATE* 06/06/2015 1142   Radiological Exams on Admission: Ct Abdomen Pelvis Wo Contrast  06/06/2015  CLINICAL DATA:  Patient with acute left lower quadrant pain, nausea and vomiting. EXAM: CT ABDOMEN AND PELVIS WITHOUT CONTRAST TECHNIQUE: Multidetector CT imaging of the abdomen and pelvis was performed following the standard protocol without IV contrast. COMPARISON:  CT abdomen pelvis 06/04/2014 FINDINGS: Lower chest: Small hiatal hernia. Normal heart size. Small left pleural effusion. Ground-glass and consolidative opacities within the left lower lobe and lingula. Hepatobiliary: Liver is normal in size and contour. Liver is diffusely low in attenuation compatible with steatosis. There is a stone within the gallbladder lumen. No gallbladder wall thickening. Pancreas: Unremarkable Spleen: Unremarkable Adrenals/Urinary Tract: The adrenal glands are normal. There is a 6 mm obstructing stone within the distal left ureter (image 60; series 201) resulting in moderate left hydroureteronephrosis. The left kidney is enlarged and there is perinephric fat stranding and fluid. The right kidney is normal in size.  Multiple 2- 3 mm stones within the right kidney. No right ureterolithiasis. Urinary bladder is decompressed. Stomach/Bowel: No abnormal bowel wall thickening or evidence for bowel obstruction. Normal morphology to the stomach. Vascular/Lymphatic: Tortuous yet normal caliber abdominal aorta with peripheral calcified atherosclerotic plaque. Ectatic right iliac artery. No retroperitoneal lymphadenopathy. Other: Bilateral fat containing inguinal hernias. Musculoskeletal: Lumbar spine degenerative changes. No aggressive or acute appearing osseous lesions. IMPRESSION: There is an obstructing 6 mm stone within the distal left ureter resulting in moderate left hydroureteronephrosis, enlargement of the left kidney and left perinephric fat stranding and fluid. Additional nonobstructing stones within the inferior pole of the right kidney measuring approximately 2-3 mm. Small left pleural effusion with underlying consolidation within the left lower lobe and lingula which may represent atelectasis or infection. Hepatic steatosis. Electronically Signed   By: Lovey Newcomer M.D.   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  -Patient will be admitted to [facility](will transfer him from [facility] emergency department) for Urologic  intervention. EDP spoke with Resident covering for [facility]- Jearld Adjutant, MD -Received a dose of Rocephin in ED. Vancomycin ordered, will discontinue Rocephin and start Zosyn to broaden coverage for possible LLL infiltrate.  -Obtain blood cultures -Follow-up on urine culture -Lactic  acid has come down to normal which is  reassuring -Not sure when patient will undergo urologic procedure. Will place keep NPO and place SCDs instead of  blood thinner for DVT prophylaxis -Low dose of Dilaudid Q 4 prn for severe pain- His BP is already on low side  Leukocytosis. White count 15.7. Lactic acid initially elevated at 3.1 but improved with fluids now 1.83. Patient is afebrile. Blood pressure soft in the nineties but he tends to run low normal. Platelets are low but this is also chronic. Not overly suspicious of sepsis at this point -Patient got 2 L fluid bolus in the ED - IV fluids at 75 cc an hour  Possible CAP vrs atelectasis of LLL on CXR. CT-scan of abdomen and pelvis did reveal some consolidative material in LLL and lingula -Will treat for CAP -Added Zosyn -follow up sputum Gram stain and sputum culture -Follow-up on strep pneumo urinary antigen - recd repeat CXR for f/u  AKI superimposed on CKD stage 2. Baseline creatinine 1.1 4, up to 2.2 to today -Should improve with IV hydration -Avoid nephrotoxic medications. Holding home Hyzaar -A.m. BMET  PVD. 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  Per wife patient scheduled for intervention of cerebral aneurysm later this month.     Hypertension. Blood pressure on low side today. Hold tonight's dose of amlodipine. Will continue metoprolol to avoid rebound tachycardia  Hyperlipidemia -Continue home Crestor   DVT prophylaxis:   SCDs. Code Status:   Full code  Family Communication: Patient's wife and daughter at bedside. Spoke with patient in the presence of family who understand that patient will be transferred to [facility] for urologic  intervention Disposition Plan: Discharge home in 2-3 days        Consults called:  [facility]. EDP spoke with Urology Resident -Dr. Jearld Adjutant Admission status:   Admission-  Medical bed    Tye Savoy NP [facility] Pager [phone]  If 7PM-7AM, please contact night-coverage [email or web address] Password [facility]  06/06/2015, 3:46 PM

## 2015-06-06 NOTE — ED Notes (Signed)
Pt here with c/o left sided abdominal pain and emesis that started yesterday. Denies diarrhea. Emesis x 2 today.

## 2015-06-07 ENCOUNTER — Encounter (HOSPITAL_COMMUNITY): Payer: Self-pay | Admitting: Anesthesiology

## 2015-06-07 ENCOUNTER — Inpatient Hospital Stay (HOSPITAL_COMMUNITY): Payer: Medicare Other | Admitting: Anesthesiology

## 2015-06-07 ENCOUNTER — Encounter (HOSPITAL_COMMUNITY)
Admission: EM | Disposition: A | Discharge status: Home / Self Care | Payer: Self-pay | Source: Home / Self Care | Attending: Internal Medicine

## 2015-06-07 DIAGNOSIS — N1330 Unspecified hydronephrosis: Secondary | ICD-10-CM

## 2015-06-07 DIAGNOSIS — I10 Essential (primary) hypertension: Secondary | ICD-10-CM

## 2015-06-07 DIAGNOSIS — N179 Acute kidney failure, unspecified: Secondary | ICD-10-CM

## 2015-06-07 DIAGNOSIS — E872 Acidosis: Secondary | ICD-10-CM

## 2015-06-07 HISTORY — PX: CYSTOSCOPY W/ URETERAL STENT PLACEMENT: SHX1429

## 2015-06-07 LAB — BASIC METABOLIC PANEL
Anion gap: 10 (ref 5–15)
BUN: 48 mg/dL — AB (ref 6–20)
CALCIUM: 7.7 mg/dL — AB (ref 8.9–10.3)
CO2: 24 mmol/L (ref 22–32)
Chloride: 102 mmol/L (ref 101–111)
Creatinine, Ser: 2.13 mg/dL — ABNORMAL HIGH (ref 0.61–1.24)
GFR calc Af Amer: 33 mL/min — ABNORMAL LOW (ref >60)
GFR, EST NON AFRICAN AMERICAN: 29 mL/min — AB (ref >60)
GLUCOSE: 115 mg/dL — AB (ref 65–99)
Potassium: 3.3 mmol/L — ABNORMAL LOW (ref 3.5–5.1)
Sodium: 136 mmol/L (ref 135–145)

## 2015-06-07 LAB — SURGICAL PCR SCREEN
MRSA, PCR: NEGATIVE
STAPHYLOCOCCUS AUREUS: NEGATIVE

## 2015-06-07 LAB — CBC
HCT: 32.6 % — ABNORMAL LOW (ref 39.0–52.0)
Hemoglobin: 11.5 g/dL — ABNORMAL LOW (ref 13.0–17.0)
MCH: 33.4 pg (ref 26.0–34.0)
MCHC: 35.3 g/dL (ref 30.0–36.0)
MCV: 94.8 fL (ref 78.0–100.0)
Platelets: 103 10*3/uL — ABNORMAL LOW (ref 150–400)
RBC: 3.44 MIL/uL — ABNORMAL LOW (ref 4.22–5.81)
RDW: 13.5 % (ref 11.5–15.5)
WBC: 9.8 10*3/uL (ref 4.0–10.5)

## 2015-06-07 LAB — STREP PNEUMONIAE URINARY ANTIGEN: STREP PNEUMO URINARY ANTIGEN: NEGATIVE

## 2015-06-07 LAB — HIV ANTIBODY (ROUTINE TESTING W REFLEX): HIV SCREEN 4TH GENERATION: NONREACTIVE

## 2015-06-07 SURGERY — CYSTOSCOPY, FLEXIBLE, WITH STENT REPLACEMENT
Anesthesia: General | Laterality: Left

## 2015-06-07 MED ORDER — SODIUM CHLORIDE 0.9 % IR SOLN
Status: DC | PRN
  Administered 2015-06-07: 6000 mL

## 2015-06-07 MED ORDER — LIDOCAINE HCL (CARDIAC) 20 MG/ML IV SOLN
INTRAVENOUS | Status: DC | PRN
  Administered 2015-06-07: 50 mg via INTRAVENOUS

## 2015-06-07 MED ORDER — HYDROCODONE-ACETAMINOPHEN 5-325 MG PO TABS: 1.0000 | ORAL_TABLET | Freq: Four times a day (QID) | ORAL | Status: DC | PRN

## 2015-06-07 MED ORDER — DIATRIZOATE MEGLUMINE 30 % UR SOLN
URETHRAL | Status: AC
  Filled 2015-06-07: qty 300

## 2015-06-07 MED ORDER — KETOROLAC TROMETHAMINE 30 MG/ML IJ SOLN
15.0000 mg | Freq: Once | INTRAMUSCULAR | Status: DC | PRN
Start: 2015-06-07 — End: 2015-06-07

## 2015-06-07 MED ORDER — DIATRIZOATE MEGLUMINE 30 % UR SOLN
URETHRAL | Status: DC | PRN
  Administered 2015-06-07: 15 mL via URETHRAL

## 2015-06-07 MED ORDER — LIDOCAINE HCL (CARDIAC) 20 MG/ML IV SOLN
INTRAVENOUS | Status: AC
  Filled 2015-06-07: qty 5

## 2015-06-07 MED ORDER — FENTANYL CITRATE (PF) 100 MCG/2ML IJ SOLN: 25.0000 ug | INTRAMUSCULAR | Status: DC | PRN

## 2015-06-07 MED ORDER — PHENYLEPHRINE HCL 10 MG/ML IJ SOLN
INTRAMUSCULAR | Status: DC | PRN
  Administered 2015-06-07 (×2): 40 ug via INTRAVENOUS
  Administered 2015-06-07: 80 ug via INTRAVENOUS

## 2015-06-07 MED ORDER — LACTATED RINGERS IV SOLN
INTRAVENOUS | Status: DC
  Administered 2015-06-07: 14:00:00 via INTRAVENOUS

## 2015-06-07 MED ORDER — PROCHLORPERAZINE EDISYLATE 5 MG/ML IJ SOLN
10.0000 mg | Freq: Once | INTRAMUSCULAR | Status: AC
  Administered 2015-06-07: 10 mg via INTRAVENOUS
  Filled 2015-06-07: qty 2

## 2015-06-07 MED ORDER — PROPOFOL 10 MG/ML IV BOLUS
INTRAVENOUS | Status: DC | PRN
  Administered 2015-06-07: 90 mg via INTRAVENOUS

## 2015-06-07 MED ORDER — SODIUM CHLORIDE 0.9 % IV BOLUS (SEPSIS)
250.0000 mL | Freq: Once | INTRAVENOUS | Status: AC
  Administered 2015-06-07: 250 mL via INTRAVENOUS

## 2015-06-07 MED ORDER — PROPOFOL 10 MG/ML IV BOLUS
INTRAVENOUS | Status: AC
  Filled 2015-06-07: qty 20

## 2015-06-07 MED ORDER — FENTANYL CITRATE (PF) 100 MCG/2ML IJ SOLN
INTRAMUSCULAR | Status: AC
  Filled 2015-06-07: qty 2

## 2015-06-07 MED ORDER — FENTANYL CITRATE (PF) 100 MCG/2ML IJ SOLN
INTRAMUSCULAR | Status: DC | PRN
  Administered 2015-06-07: 25 ug via INTRAVENOUS

## 2015-06-07 SURGICAL SUPPLY — 15 items
BAG URINE DRAINAGE (UROLOGICAL SUPPLIES) ×2 IMPLANT
BAG URO CATCHER STRL LF (MISCELLANEOUS) IMPLANT
BASKET ZERO TIP NITINOL 2.4FR (BASKET) IMPLANT
CATH FOLEY 2WAY SLVR  5CC 16FR (CATHETERS) ×1
CATH FOLEY 2WAY SLVR 5CC 16FR (CATHETERS) ×1 IMPLANT
CATH URET 5FR 28IN OPEN ENDED (CATHETERS) ×2 IMPLANT
CONT SPEC 4OZ CLIKSEAL STRL BL (MISCELLANEOUS) ×2 IMPLANT
GLOVE BIOGEL M STRL SZ7.5 (GLOVE) ×2 IMPLANT
GOWN STRL REUS W/TWL XL LVL3 (GOWN DISPOSABLE) ×2 IMPLANT
GUIDEWIRE ANG ZIPWIRE 038X150 (WIRE) IMPLANT
GUIDEWIRE STR DUAL SENSOR (WIRE) ×2 IMPLANT
PACK CYSTO (CUSTOM PROCEDURE TRAY) ×2 IMPLANT
STENT URET 6FRX24 CONTOUR (STENTS) ×2 IMPLANT
SYR 20CC LL (SYRINGE) ×2 IMPLANT
WIRE COONS/BENSON .038X145CM (WIRE) IMPLANT

## 2015-06-07 NOTE — Progress Notes (Signed)
Left lower quadrant pain improved  Now intermittent and mild Afebrile Non toxic  Belly benign  Cr bit better but still elevated vs. Baseline Keep NPO for few hours and i will speak to Dr Alinda Money

## 2015-06-07 NOTE — H&P (View-Only) (Signed)
Patient consented for stent spoke with family

## 2015-06-07 NOTE — Consult Note (Signed)
Subjective: Tmax of 38.5 1x overnight with associated soft SBPs. No tachycardia or fevers since this time. UCx pending. WBC improved from 15.7 to 9.8 this AM. Cr slightly improved from 2.22 to 2.13. UA yesterday with signs of possible UTI with WBCs TNTC, moderate LE.   Patient reports nausea and continued left lower quadrant pain and generally not feeling well.  Objective: Vital signs in last 24 hours: Temp:  [98.4 F (36.9 C)-101.3 F (38.5 C)] 100.3 F (37.9 C) (05/08 0842) Pulse Rate:  [77-95] 77 (05/08 0842) Resp:  [12-22] 18 (05/08 0842) BP: (93-126)/(50-80) 93/56 mmHg (05/08 0842) SpO2:  [92 %-99 %] 94 % (05/08 0842) Weight:  [75.5 kg (166 lb 7.2 oz)] 75.5 kg (166 lb 7.2 oz) (05/07 1823)  Intake/Output from previous day: 05/07 0701 - 05/08 0700 In: 2870 [I.V.:2570; IV Piggyback:300] Out: 90 [Urine:90] Intake/Output this shift:    Physical Exam:  General:alert, cooperative and appears stated age GI: soft, non tender, normal bowel sounds, no palpable masses, no organomegaly, no inguinal hernia Male genitalia: + left CVA tenderness. Condom catheter with cloudy appearing urine.  Resp: no increased work of breathing on room air Extremities: no LE edema  Lab Results:  Recent Labs  06/06/15 0937 06/07/15 0503  HGB 13.5 11.5*  HCT 40.0 32.6*   BMET  Recent Labs  06/06/15 0937 06/07/15 0503  NA 135 136  K 3.8 3.3*  CL 98* 102  CO2 22 24  GLUCOSE 160* 115*  BUN 43* 48*  CREATININE 2.22* 2.13*  CALCIUM 9.0 7.7*   No results for input(s): LABPT, INR in the last 72 hours. No results for input(s): LABURIN in the last 72 hours. Results for orders placed or performed during the hospital encounter of 07/13/11  Surgical pcr screen     Status: Abnormal   Collection Time: 07/13/11  8:44 AM  Result Value Ref Range Status   MRSA, PCR NEGATIVE NEGATIVE Final   Staphylococcus aureus POSITIVE (A) NEGATIVE Final    Comment:        The Xpert SA Assay (FDA approved for  NASAL specimens only), is one component of a comprehensive surveillance program.  It is not intended to diagnose infection nor to guide or monitor treatment.    Studies/Results: Ct Abdomen Pelvis Wo Contrast  06/06/2015  CLINICAL DATA:  Patient with acute left lower quadrant pain, nausea and vomiting. EXAM: CT ABDOMEN AND PELVIS WITHOUT CONTRAST TECHNIQUE: Multidetector CT imaging of the abdomen and pelvis was performed following the standard protocol without IV contrast. COMPARISON:  CT abdomen pelvis 06/04/2014 FINDINGS: Lower chest: Small hiatal hernia. Normal heart size. Small left pleural effusion. Ground-glass and consolidative opacities within the left lower lobe and lingula. Hepatobiliary: Liver is normal in size and contour. Liver is diffusely low in attenuation compatible with steatosis. There is a stone within the gallbladder lumen. No gallbladder wall thickening. Pancreas: Unremarkable Spleen: Unremarkable Adrenals/Urinary Tract: The adrenal glands are normal. There is a 6 mm obstructing stone within the distal left ureter (image 60; series 201) resulting in moderate left hydroureteronephrosis. The left kidney is enlarged and there is perinephric fat stranding and fluid. The right kidney is normal in size. Multiple 2- 3 mm stones within the right kidney. No right ureterolithiasis. Urinary bladder is decompressed. Stomach/Bowel: No abnormal bowel wall thickening or evidence for bowel obstruction. Normal morphology to the stomach. Vascular/Lymphatic: Tortuous yet normal caliber abdominal aorta with peripheral calcified atherosclerotic plaque. Ectatic right iliac artery. No retroperitoneal lymphadenopathy. Other: Bilateral fat containing inguinal  hernias. Musculoskeletal: Lumbar spine degenerative changes. No aggressive or acute appearing osseous lesions. IMPRESSION: There is an obstructing 6 mm stone within the distal left ureter resulting in moderate left hydroureteronephrosis, enlargement of the  left kidney and left perinephric fat stranding and fluid. Additional nonobstructing stones within the inferior pole of the right kidney measuring approximately 2-3 mm. Small left pleural effusion with underlying consolidation within the left lower lobe and lingula which may represent atelectasis or infection. Hepatic steatosis. Electronically Signed   By: Lovey Newcomer M.D.   On: 06/06/2015 14:36    Assessment/Plan: 75 y.o. male with left ureteral stone, UA with possible evidence of infection with TNTC WBCs, Tmax of 38.5 (101.3) yesterday evening without associated tachycardia or hypotension. WBC did improve from 15 to 9 this AM with zosyn and ceftriaxone. Urine culture pending. Creatinine is only mildly improved today with hydration.   Given fever, concerning UA, advanced age and persistent AKI will proceed with left ureteral stent placement this afternoon.  Keep patient NPO  Patient was consented for cystourethroscopy, left retrograde pyelogram and left ureteral stent placement at the bedside   If efflux of purulent urine with stent placement will place indwelling foley catheter. If no efflux of purulent urine and vital signs remain stable we would replaced condom catheter  If becomes febrile >101, particularly with associated tachycardia or hypotension please contact the on call urologist as we would proceed with stenting emergently  Agree with continued zosyn   Continue to trend creatinine and WBC  Follow up urine culture   LOS: 1 day   Acie Fredrickson 06/07/2015, 10:55 AM

## 2015-06-07 NOTE — H&P (View-Only) (Signed)
Urology Consult   Physician requesting consult: Maren Reamer, MD   Reason for consult: Nephrolithiasis  History of Present Illness: Lance Dillon is a 75 y.o. with a complicated PMH including significant PVD who come in with flank pain. The patient has passed stones in the past and was found on CT to have a 6 mm left ureteral stone. The patient was afebrile in the ED with a hemoconcentrated serum sample with a white count of 15. He had a UA consistent with nephrolithiasis with moderate leuks and nitrite negative. The patient's creatinine also elevated above 2, likely from dehydration given his normal appearing contralateral kidney.   Past Medical History  Diagnosis Date  . Hypercholesterolemia   . Hypertension   . Macular degeneration   . Hx of radiation therapy 03/22/11 to 05/08/11    PSA recurrent carcinoma of prostate  . Heart murmur     No evidence of valvular lesion noted on Echo 01/2010  . Peripheral vascular disease [facility]) June 2013    Bilateral SFA stenosis: Status post right femoropopliteal bypass - Dr. Kellie Simmering  . Stones in the urinary tract   . Renal artery stenosis (HCC)     70% left  . Arthritis   . Macular degeneration of both eyes   . Blind right eye   . Colon polyps 2004, 07, 14  . Cataract   . Prostate cancer ([place]) 2014  . 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 Atherosclerosis of aortic bifurcation and common iliac arteries [facility]) February 2017     Noted on abdominal/renal Dopplers    Past Surgical History  Procedure Laterality Date  . Prostatectomy retropubic radical  07/15/2002    with nerve sparing, Gleason 3+4=7  . Penile prosthesis implant    . Penile prosthesis  removal    . Prostate  biopsy    . Hernia repair      right side,lft  . Femoral-popliteal bypass graft  07/19/2011    Procedure: BYPASS GRAFT FEMORAL-POPLITEAL ARTERY;  Surgeon: Mal Misty, MD;  Location: [facility] OR;  Service: Vascular;  Laterality: Right;  Right Femoral - Popliteal  Bypss with saphenous vein  . Intraoperative arteriogram  07/19/2011    Procedure: INTRA OPERATIVE ARTERIOGRAM;  Surgeon: Mal Misty, MD;  Location: [place];  Service: Vascular;  Laterality: Right;  . Pr vein bypass graft,aorto-fem-pop  07/19/11    Right  Fem/Pop BPG  . Colonoscopy w/ biopsies      multiple   . Transthoracic echocardiogram  January    Normal EF and overall function  . Nm myoview ltd  June 2013    Normal EF, very small area of basal-mid inferior ischemia; LOW RISK  . Reclast  July 25, 2013  . Abdominal aortagram N/A 06/20/2011    Procedure: ABDOMINAL Maxcine Ham;  Surgeon: Serafina Mitchell, MD;  Location: [facility] CATH LAB;  Service: Cardiovascular;  Laterality: N/A;  . Renal duplex  07/01/2012    Left renal artery 60-99% diameter reduction  . Cardiovascular stress test  07/06/2011    Evidence of mild ischemia in Basal Inferior and Mid Inferior regions, no significant wall motion abnormalities noted.  . Transthoracic echocardiogram  02/23/2010    EF 123456, lv systolic function normal  .  Eye surgery Right Aug. 2016    Cataract    Current Hospital Medications:  Home Meds:    Medication List    ASK your doctor about these medications        acetaminophen 325 MG tablet  Commonly known as:  TYLENOL  Take 650 mg by mouth every 6 (six) hours as needed. For pain     amLODipine 10 MG tablet  Commonly known as:  NORVASC  Take 10 mg by mouth at bedtime.     aspirin 81 MG tablet  Take 81 mg by mouth daily.     CALTRATE 600+D PO  Take 1 tablet by mouth 2 (two) times daily.     docusate sodium 100 MG capsule  Commonly known as:  COLACE  Take 100 mg by mouth at bedtime.     Fish Oil 1200 MG Caps  Take 3,600 mg by mouth  daily.     fluticasone 50 MCG/ACT nasal spray  Commonly known as:  FLONASE  Place 2 sprays into both nostrils as needed (for nasal congestion).     ibuprofen 200 MG tablet  Commonly known as:  ADVIL,MOTRIN  Take 200 mg by mouth every 6 (six) hours as needed for mild pain.     latanoprost 0.005 % ophthalmic solution  Commonly known as:  XALATAN  Place 1 drop into the left eye at bedtime.     losartan-hydrochlorothiazide 100-12.5 MG tablet  Commonly known as:  HYZAAR  Take 1 tablet by mouth at bedtime.     LUTEIN PO  Take 1 tablet by mouth daily.     metoprolol succinate 25 MG 24 hr tablet  Commonly known as:  TOPROL-XL  Take 1 tablet (25 mg total) by mouth daily.     polyethylene glycol packet  Commonly known as:  MIRALAX / GLYCOLAX  Take 17 g by mouth at bedtime.     rosuvastatin 10 MG tablet  Commonly known as:  CRESTOR  Take 10 mg by mouth at bedtime.        Scheduled Meds: . aspirin  81 mg Oral Daily  . diatrizoate meglumine-sodium      . docusate sodium  100 mg Oral QHS  . latanoprost  1 drop Left Eye QHS  . metoprolol succinate  25 mg Oral Daily  . piperacillin-tazobactam (ZOSYN)  IV  3.375 g Intravenous Q8H  . [START ON 06/07/2015] polyethylene glycol  17 g Oral QHS  . rosuvastatin  10 mg Oral QHS  . [START ON 06/07/2015] vancomycin  750 mg Intravenous Q24H   Continuous Infusions: . sodium chloride 75 mL/hr at 06/06/15 1844   PRN Meds:.acetaminophen **OR** acetaminophen, bisacodyl, fluticasone, HYDROmorphone (DILAUDID) injection, ondansetron **OR** ondansetron (ZOFRAN) IV, polyethylene glycol, promethazine, traZODone  Allergies:  Allergies  Allergen Reactions  . Avelox [Moxifloxacin Hcl In Nacl] Diarrhea    diarrhea    Family History  Problem Relation Age of Onset  . Prostate cancer Brother 4  . Heart disease Brother     Before age 72  . Hypertension Brother   . Heart attack Brother   . Colon cancer Neg Hx   . Aneurysm Father   . Heart disease  Father   . Hypertension Father   . Heart disease Mother     Before age 52  . Hypertension Mother   . Heart attack Mother   . Heart disease Sister   . Hypertension Sister   . Heart attack Sister     Social History:  reports  that he quit smoking about 12 years ago. His smoking use included Cigarettes. He has a 60 pack-year smoking history. He has never used smokeless tobacco. He reports that he does not drink alcohol or use illicit drugs.  ROS: A complete review of systems was performed.  All systems are negative except for pertinent findings as noted.  Physical Exam:  Vital signs in last 24 hours: Temp:  [98.2 F (36.8 C)-99.4 F (37.4 C)] 98.4 F (36.9 C) (05/07 1823) Pulse Rate:  [80-95] 95 (05/07 1823) Resp:  [12-22] 22 (05/07 1823) BP: (93-121)/(50-80) 121/80 mmHg (05/07 1823) SpO2:  [92 %-99 %] 98 % (05/07 1823) Weight:  [75.5 kg (166 lb 7.2 oz)] 75.5 kg (166 lb 7.2 oz) (05/07 1823)   Laboratory Data:   Recent Labs  06/06/15 0937  WBC 15.7*  HGB 13.5  HCT 40.0  PLT 144*     Recent Labs  06/06/15 0937  NA 135  K 3.8  CL 98*  GLUCOSE 160*  BUN 43*  CALCIUM 9.0  CREATININE 2.22*     Results for orders placed or performed during the hospital encounter of 06/06/15 (from the past 24 hour(s))  Lipase, blood     Status: None   Collection Time: 06/06/15  9:37 AM  Result Value Ref Range   Lipase 15 11 - 51 U/L  Comprehensive metabolic panel     Status: Abnormal   Collection Time: 06/06/15  9:37 AM  Result Value Ref Range   Sodium 135 135 - 145 mmol/L   Potassium 3.8 3.5 - 5.1 mmol/L   Chloride 98 (L) 101 - 111 mmol/L   CO2 22 22 - 32 mmol/L   Glucose, Bld 160 (H) 65 - 99 mg/dL   BUN 43 (H) 6 - 20 mg/dL   Creatinine, Ser 2.22 (H) 0.61 - 1.24 mg/dL   Calcium 9.0 8.9 - 10.3 mg/dL   Total Protein 6.9 6.5 - 8.1 g/dL   Albumin 3.4 (L) 3.5 - 5.0 g/dL   AST 45 (H) 15 - 41 U/L   ALT 36 17 - 63 U/L   Alkaline Phosphatase 44 38 - 126 U/L   Total Bilirubin 2.6  (H) 0.3 - 1.2 mg/dL   GFR calc non Af Amer 27 (L) >60 mL/min   GFR calc Af Amer 32 (L) >60 mL/min   Anion gap 15 5 - 15  CBC     Status: Abnormal   Collection Time: 06/06/15  9:37 AM  Result Value Ref Range   WBC 15.7 (H) 4.0 - 10.5 K/uL   RBC 4.21 (L) 4.22 - 5.81 MIL/uL   Hemoglobin 13.5 13.0 - 17.0 g/dL   HCT 40.0 39.0 - 52.0 %   MCV 95.0 78.0 - 100.0 fL   MCH 32.1 26.0 - 34.0 pg   MCHC 33.8 30.0 - 36.0 g/dL   RDW 13.2 11.5 - 15.5 %   Platelets 144 (L) 150 - 400 K/uL  I-Stat CG4 Lactic Acid, ED     Status: Abnormal   Collection Time: 06/06/15  9:53 AM  Result Value Ref Range   Lactic Acid, Venous 3.18 (HH) 0.5 - 2.0 mmol/L   Comment NOTIFIED PHYSICIAN   Urinalysis, Routine w reflex microscopic     Status: Abnormal   Collection Time: 06/06/15 11:42 AM  Result Value Ref Range   Color, Urine AMBER (A) YELLOW   APPearance TURBID (A) CLEAR   Specific Gravity, Urine 1.022 1.005 - 1.030   pH 5.5 5.0 - 8.0  Glucose, UA NEGATIVE NEGATIVE mg/dL   Hgb urine dipstick MODERATE (A) NEGATIVE   Bilirubin Urine NEGATIVE NEGATIVE   Ketones, ur NEGATIVE NEGATIVE mg/dL   Protein, ur 100 (A) NEGATIVE mg/dL   Nitrite NEGATIVE NEGATIVE   Leukocytes, UA MODERATE (A) NEGATIVE  Urine microscopic-add on     Status: Abnormal   Collection Time: 06/06/15 11:42 AM  Result Value Ref Range   Squamous Epithelial / LPF 0-5 (A) NONE SEEN   WBC, UA TOO NUMEROUS TO COUNT 0 - 5 WBC/hpf   RBC / HPF 6-30 0 - 5 RBC/hpf   Bacteria, UA FEW (A) NONE SEEN  I-Stat CG4 Lactic Acid, ED     Status: None   Collection Time: 06/06/15 12:32 PM  Result Value Ref Range   Lactic Acid, Venous 1.83 0.5 - 2.0 mmol/L  I-Stat CG4 Lactic Acid, ED     Status: None   Collection Time: 06/06/15  5:11 PM  Result Value Ref Range   Lactic Acid, Venous 1.44 0.5 - 2.0 mmol/L   No results found for this or any previous visit (from the past 240 hour(s)).  Renal Function:  Recent Labs  06/06/15 0937  CREATININE 2.22*    Estimated Creatinine Clearance: 29.2 mL/min (by C-G formula based on Cr of 2.22).  Radiologic Imaging: Ct Abdomen Pelvis Wo Contrast  06/06/2015  CLINICAL DATA:  Patient with acute left lower quadrant pain, nausea and vomiting. EXAM: CT ABDOMEN AND PELVIS WITHOUT CONTRAST TECHNIQUE: Multidetector CT imaging of the abdomen and pelvis was performed following the standard protocol without IV contrast. COMPARISON:  CT abdomen pelvis 06/04/2014 FINDINGS: Lower chest: Small hiatal hernia. Normal heart size. Small left pleural effusion. Ground-glass and consolidative opacities within the left lower lobe and lingula. Hepatobiliary: Liver is normal in size and contour. Liver is diffusely low in attenuation compatible with steatosis. There is a stone within the gallbladder lumen. No gallbladder wall thickening. Pancreas: Unremarkable Spleen: Unremarkable Adrenals/Urinary Tract: The adrenal glands are normal. There is a 6 mm obstructing stone within the distal left ureter (image 60; series 201) resulting in moderate left hydroureteronephrosis. The left kidney is enlarged and there is perinephric fat stranding and fluid. The right kidney is normal in size. Multiple 2- 3 mm stones within the right kidney. No right ureterolithiasis. Urinary bladder is decompressed. Stomach/Bowel: No abnormal bowel wall thickening or evidence for bowel obstruction. Normal morphology to the stomach. Vascular/Lymphatic: Tortuous yet normal caliber abdominal aorta with peripheral calcified atherosclerotic plaque. Ectatic right iliac artery. No retroperitoneal lymphadenopathy. Other: Bilateral fat containing inguinal hernias. Musculoskeletal: Lumbar spine degenerative changes. No aggressive or acute appearing osseous lesions. IMPRESSION: There is an obstructing 6 mm stone within the distal left ureter resulting in moderate left hydroureteronephrosis, enlargement of the left kidney and left perinephric fat stranding and fluid. Additional  nonobstructing stones within the inferior pole of the right kidney measuring approximately 2-3 mm. Small left pleural effusion with underlying consolidation within the left lower lobe and lingula which may represent atelectasis or infection. Hepatic steatosis. Electronically Signed   By: Lovey Newcomer M.D.   On: 06/06/2015 14:36    I independently reviewed the above imaging studies.  Impression/Recommendation 75 y.o. male with left ureteral stone. Without fever the evidence for an infected kidney stone is light. The patient's UA is consistent with inflammation and not infection. The patient's white count is likely a systemic response to the stone and dehydration. The patient's most pressing concern appears to be his AKI, which given his precarious  health related to PVD is worrisome and we would like to see this trend toward baseline with hydration. If the patient spikes a fever >101 we would consider moving forward with emergent stent to ensure drainage of his kidney, but if he is afebrile and his labs improve he may be able to have this stone treated as an outpatient.   I performed a history and physical examination of the patient and discussed his management with the resident.  I reviewed the resident's note and agree with the documented findings and plan of care      Christell Faith 06/06/2015, 7:38 PM

## 2015-06-07 NOTE — Interval H&P Note (Signed)
History and Physical Interval Note:  06/07/2015 1:03 PM  Lance Dillon  has presented today for surgery, with the diagnosis of left ureteral stone  The various methods of treatment have been discussed with the patient and family. After consideration of risks, benefits and other options for treatment, the patient has consented to  Procedure(s): [place] (Left) as a surgical intervention .  The patient's history has been reviewed, patient examined, no change in status, stable for surgery.  I have reviewed the patient's chart and labs.  Questions were answered to the patient's satisfaction.     Winter Jocelyn A

## 2015-06-07 NOTE — Anesthesia Procedure Notes (Signed)
Procedure Name: LMA Insertion Date/Time: 06/07/2015 1:26 PM Performed by: Anne Fu Pre-anesthesia Checklist: Patient identified, Emergency Drugs available, Suction available, Patient being monitored and Timeout performed Patient Re-evaluated:Patient Re-evaluated prior to inductionOxygen Delivery Method: Circle system utilized Preoxygenation: Pre-oxygenation with 100% oxygen Intubation Type: IV induction Ventilation: Mask ventilation without difficulty LMA: LMA inserted LMA Size: 4.0 Number of attempts: 1 Placement Confirmation: positive ETCO2 and breath sounds checked- equal and bilateral Tube secured with: Tape

## 2015-06-07 NOTE — Anesthesia Postprocedure Evaluation (Signed)
Anesthesia Post Note  Patient: Lance Dillon  Procedure(s) Performed: Procedure(s) (LRB): CYSTOSCOPY, RETROGRADE PYLEGRAM  WITH STENT PLACEMENT (Left)  Patient location during evaluation: PACU Anesthesia Type: General Level of consciousness: awake and alert Pain management: pain level controlled Vital Signs Assessment: post-procedure vital signs reviewed and stable Respiratory status: spontaneous breathing, nonlabored ventilation, respiratory function stable and patient connected to nasal cannula oxygen Cardiovascular status: blood pressure returned to baseline and stable Postop Assessment: no signs of nausea or vomiting Anesthetic complications: no    Last Vitals:  Filed Vitals:   06/07/15 1141 06/07/15 1405  BP: 104/58 92/58  Pulse: 81 72  Temp: 38 C 36.9 C  Resp: 18 12    Last Pain:  Filed Vitals:   06/07/15 1419  PainSc: 0-No pain                 Aditi Rovira S

## 2015-06-07 NOTE — Op Note (Signed)
Preoperative diagnosis: Left ureteral stone  Postoperative diagnosis: Left ureteral stone and possible urinary tract infection Surgery: Cystoscopy left retrograde ureterogram and insertion of left ureteral stent Surgeon: Dr. Nicki Reaper Nadezhda Pollitt  The patient has the above diagnosis and consented above procedure. The patient was initially admitted to the hospital for pain control because it was elderly and somewhat frail. He developed a low-grade temperature through the night that came and went. He consented the above procedure. He is on IV antibiotics.  Patient was prepped and draped in usual fashion. 21 French cystoscope was utilized. Extra care was taken with leg positioning  Penile bulbar urethra normal. He had a rigid prostatic urethra. Deep anesthesia was utilized. The urine was cloudy but there is no erythema of the mucosa. There were white flecks in the urine. Urine was sent for culture.  The left ureter was a little bit difficult to locate initially primarily because I think he had a small capacity spastic bladder and rigidity of the prostatic urethra  Under fluoroscopic guidance a sensor wire was passed to the level of the stone on the left side. A 6 French open-end ureteral catheter was passed under cystoscopic and fluoroscopic guidance to that level. Wire was removed  Retrograde ureterogram: 7 mL of contrast gently was introduced. He had mild hydroureter. He had mild dilation of the renal pelvis on the left. There was no extravasation  A sensor wire was easily passed curling in the upper pole calyx. A 24 cm x 6 French double-J stent without the string was passed under fluoroscopic and cystoscopic guidance curling in the renal pelvis and in the bladder. X-rays were taken. Bladder was emptied. Condom catheter will not be used until the day of discharge.

## 2015-06-07 NOTE — Transfer of Care (Signed)
Immediate Anesthesia Transfer of Care Note  Patient: Lance Dillon  Procedure(s) Performed: Procedure(s): CYSTOSCOPY, RETROGRADE PYLEGRAM  WITH STENT PLACEMENT (Left)  Patient Location: PACU  Anesthesia Type:General  Level of Consciousness: awake  Airway & Oxygen Therapy: Patient Spontanous Breathing and Patient connected to face mask oxygen  Post-op Assessment: Report given to RN and Post -op Vital signs reviewed and stable  Post vital signs: Reviewed and stable  Last Vitals:  Filed Vitals:   06/07/15 0842 06/07/15 1141  BP: 93/56 104/58  Pulse: 77 81  Temp: 37.9 C 38 C  Resp: 18 18    Last Pain:  Filed Vitals:   06/07/15 1142  PainSc: 0-No pain      Patients Stated Pain Goal: 2 ([id number])  Complications: No apparent anesthesia complications

## 2015-06-07 NOTE — Anesthesia Preprocedure Evaluation (Signed)
Anesthesia Evaluation  Patient identified by MRN, date of birth, ID band Patient awake    Reviewed: Allergy & Precautions, NPO status , Patient's Chart, lab work & pertinent test results  Airway Mallampati: II  TM Distance: >3 FB Neck ROM: Full    Dental no notable dental hx.    Pulmonary neg pulmonary ROS, former smoker,    Pulmonary exam normal breath sounds clear to auscultation       Cardiovascular hypertension, Pt. on medications + Peripheral Vascular Disease  Normal cardiovascular exam Rhythm:Regular Rate:Normal     Neuro/Psych negative neurological ROS  negative psych ROS   GI/Hepatic negative GI ROS, Neg liver ROS,   Endo/Other  negative endocrine ROS  Renal/GU negative Renal ROS  negative genitourinary   Musculoskeletal negative musculoskeletal ROS (+)   Abdominal   Peds negative pediatric ROS (+)  Hematology negative hematology ROS (+)   Anesthesia Other Findings   Reproductive/Obstetrics negative OB ROS                             Anesthesia Physical Anesthesia Plan  ASA: III  Anesthesia Plan: General   Post-op Pain Management:    Induction: Intravenous  Airway Management Planned: LMA  Additional Equipment:   Intra-op Plan:   Post-operative Plan: Extubation in OR  Informed Consent: I have reviewed the patients History and Physical, chart, labs and discussed the procedure including the risks, benefits and alternatives for the proposed anesthesia with the patient or authorized representative who has indicated his/her understanding and acceptance.   Dental advisory given  Plan Discussed with: CRNA and Surgeon  Anesthesia Plan Comments:         Anesthesia Quick Evaluation

## 2015-06-07 NOTE — Progress Notes (Signed)
Patient consented for stent spoke with family

## 2015-06-07 NOTE — Progress Notes (Signed)
Post op looks good No pain Eating No fever  Get labs in am and follow

## 2015-06-07 NOTE — Progress Notes (Signed)
2 am : temp 100.7 4 am: 99.4 Keep NPO

## 2015-06-07 NOTE — Progress Notes (Addendum)
PROGRESS NOTE    Lance Dillon  [id number] DOB: 11/28/1940 DOA: 06/06/2015 PCP: Thressa Sheller, MD  Outpatient Specialists: Dr.Borden Brief Narrative: 74/M with Htn, dyslipidema, admitted with Sepsis, pyelonephritis and obstrcuting ureteral stone, improving on Abx, s/p Cysto and stent placement 5/8  Assessment & Plan:  Sepsis due to obstructing left ureteral stone with associated hydronephrosis and pyelonephritis.  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 Baseline creatinine 1.1 4, up to 2.2  -due to sepsis, hydronephrosis and hyzaar -continue IVF, Ureteral stenting today  PVD. 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 Per wife patient scheduled for intervention of cerebral aneurysm later this month.   Hypertension.  -hold amlodipine and hyzaar, continue metoprolol to avoid rebound tachycardia if BP permits  Hyperlipidemia -Continue home Crestor  DVT prophylaxis: SCDs. Code Status: Full code  Family Communication: Patient's wife at bedside.  Disposition Plan: Home when improved, hopefully few days  Consultants:  Urology   Procedures:   Antimicrobials:Zosyn   Subjective: Febrile to 101, pain improved  Objective: Filed Vitals:   06/07/15 0030 06/07/15 0228 06/07/15 0424 06/07/15 0842  BP: 94/65 126/68  93/56    Pulse:    77  Temp: 99.7 F (37.6 C) 100.7 F (38.2 C) 99.4 F (37.4 C) 100.3 F (37.9 C)  TempSrc: Oral Oral Oral Oral  Resp:    18  Height:      Weight:      SpO2:    94%    Intake/Output Summary (Last 24 hours) at 06/07/15 0850 Last data filed at 06/06/15 2300  Gross per 24 hour  Intake   2870 ml  Output     90 ml  Net   2780 ml   Filed Weights   06/06/15 1823  Weight: 75.5 kg (166 lb 7.2 oz)    Examination:  General exam: Appears calm and comfortable, no distress Respiratory system: Clear to auscultation. Respiratory effort normal. Cardiovascular system: S1 & S2 heard, RRR. No JVD, murmurs, rubs, gallops or clicks. No pedal edema. Gastrointestinal system: Abdomen is nondistended, soft and mild L flank tenderness. No organomegaly or masses felt. Normal bowel sounds Central nervous system: Alert and oriented. No focal neurological deficits. Extremities: Symmetric 5 x 5 power. Skin: No rashes, lesions or ulcers Psychiatry: Judgement and insight appear normal. Mood & affect appropriate.     Data Reviewed: I have personally reviewed following labs and imaging studies  CBC:  Recent Labs Lab 06/06/15 0937 06/07/15 0503  WBC 15.7* 9.8  HGB 13.5 11.5*  HCT 40.0 32.6*  MCV 95.0 94.8  PLT 144* [id number]*   Basic Metabolic Panel:  Recent Labs Lab 06/06/15 0937 06/07/15 0503  NA 135 136  K 3.8 3.3*  CL 98* 102  CO2 22 24  GLUCOSE 160* 115*  BUN 43* 48*  CREATININE 2.22* 2.13*  CALCIUM 9.0 7.7*   GFR: Estimated Creatinine Clearance: 30.4 mL/min (by C-G formula based on Cr  of 2.13). Liver Function Tests:  Recent Labs Lab 06/06/15 0937  AST 45*  ALT 36  ALKPHOS 44  BILITOT 2.6*  PROT 6.9  ALBUMIN 3.4*    Recent Labs Lab 06/06/15 0937  LIPASE 15   No results for input(s): AMMONIA in the last 168 hours. Coagulation Profile: No results for input(s): INR, PROTIME in the last 168 hours. Cardiac Enzymes: No results for input(s): CKTOTAL, CKMB,  CKMBINDEX, TROPONINI in the last 168 hours. BNP (last 3 results) No results for input(s): PROBNP in the last 8760 hours. HbA1C: No results for input(s): HGBA1C in the last 72 hours. CBG: No results for input(s): GLUCAP in the last 168 hours. Lipid Profile: No results for input(s): CHOL, HDL, LDLCALC, TRIG, CHOLHDL, LDLDIRECT in the last 72 hours. Thyroid Function Tests: No results for input(s): TSH, T4TOTAL, FREET4, T3FREE, THYROIDAB in the last 72 hours. Anemia Panel: No results for input(s): VITAMINB12, FOLATE, FERRITIN, TIBC, IRON, RETICCTPCT in the last 72 hours. Urine analysis:    Component Value Date/Time   COLORURINE AMBER* 06/06/2015 1142   APPEARANCEUR TURBID* 06/06/2015 1142   LABSPEC 1.022 06/06/2015 1142   PHURINE 5.5 06/06/2015 1142   GLUCOSEU NEGATIVE 06/06/2015 1142   HGBUR MODERATE* 06/06/2015 1142   BILIRUBINUR NEGATIVE 06/06/2015 1142   KETONESUR NEGATIVE 06/06/2015 1142   PROTEINUR 100* 06/06/2015 1142   UROBILINOGEN 1.0 07/13/2011 0844   NITRITE NEGATIVE 06/06/2015 1142   LEUKOCYTESUR MODERATE* 06/06/2015 1142   Sepsis Labs: @LABRCNTIP (procalcitonin:4,lacticidven:4)  )No results found for this or any previous visit (from the past 240 hour(s)).       Radiology Studies: Ct Abdomen Pelvis Wo Contrast  06/06/2015  CLINICAL DATA:  Patient with acute left lower quadrant pain, nausea and vomiting. EXAM: CT ABDOMEN AND PELVIS WITHOUT CONTRAST TECHNIQUE: Multidetector CT imaging of the abdomen and pelvis was performed following the standard protocol without IV contrast. COMPARISON:  CT abdomen pelvis 06/04/2014 FINDINGS: Lower chest: Small hiatal hernia. Normal heart size. Small left pleural effusion. Ground-glass and consolidative opacities within the left lower lobe and lingula. Hepatobiliary: Liver is normal in size and contour. Liver is diffusely low in attenuation compatible with steatosis. There is a stone within the gallbladder lumen. No gallbladder wall  thickening. Pancreas: Unremarkable Spleen: Unremarkable Adrenals/Urinary Tract: The adrenal glands are normal. There is a 6 mm obstructing stone within the distal left ureter (image 60; series 201) resulting in moderate left hydroureteronephrosis. The left kidney is enlarged and there is perinephric fat stranding and fluid. The right kidney is normal in size. Multiple 2- 3 mm stones within the right kidney. No right ureterolithiasis. Urinary bladder is decompressed. Stomach/Bowel: No abnormal bowel wall thickening or evidence for bowel obstruction. Normal morphology to the stomach. Vascular/Lymphatic: Tortuous yet normal caliber abdominal aorta with peripheral calcified atherosclerotic plaque. Ectatic right iliac artery. No retroperitoneal lymphadenopathy. Other: Bilateral fat containing inguinal hernias. Musculoskeletal: Lumbar spine degenerative changes. No aggressive or acute appearing osseous lesions. IMPRESSION: There is an obstructing 6 mm stone within the distal left ureter resulting in moderate left hydroureteronephrosis, enlargement of the left kidney and left perinephric fat stranding and fluid. Additional nonobstructing stones within the inferior pole of the right kidney measuring approximately 2-3 mm. Small left pleural effusion with underlying consolidation within the left lower lobe and lingula which may represent atelectasis or infection. Hepatic steatosis. Electronically Signed   By: Lovey Newcomer M.D.   On: 06/06/2015 14:36        Scheduled Meds: . antiseptic oral rinse  7 mL  Mouth Rinse BID  . aspirin  81 mg Oral Daily  . docusate sodium  100 mg Oral QHS  . latanoprost  1 drop Left Eye QHS  . metoprolol succinate  25 mg Oral Daily  . piperacillin-tazobactam (ZOSYN)  IV  3.375 g Intravenous Q8H  . polyethylene glycol  17 g Oral QHS  . rosuvastatin  10 mg Oral QHS  . sodium chloride  250 mL Intravenous Once   Continuous Infusions: . sodium chloride 75 mL/hr at 06/06/15 1844      LOS: 1 day    Time spent: 18min    Domenic Polite, MD [facility] Pager [phone]  If 7PM-7AM, please contact night-coverage [email or web address] Password [email or web address] 06/07/2015, 8:50 AM

## 2015-06-07 NOTE — Interval H&P Note (Signed)
History and Physical Interval Note:  06/07/2015 1:02 PM  Lance Dillon  has presented today for surgery, with the diagnosis of left ureteral stone  The various methods of treatment have been discussed with the patient and family. After consideration of risks, benefits and other options for treatment, the patient has consented to  Procedure(s): [place] (Left) as a surgical intervention .  The patient's history has been reviewed, patient examined, no change in status, stable for surgery.  I have reviewed the patient's chart and labs.  Questions were answered to the patient's satisfaction.     Peggi Yono A

## 2015-06-08 LAB — CBC
HCT: 30.3 % — ABNORMAL LOW (ref 39.0–52.0)
HEMOGLOBIN: 10.6 g/dL — AB (ref 13.0–17.0)
MCH: 33.1 pg (ref 26.0–34.0)
MCHC: 35 g/dL (ref 30.0–36.0)
MCV: 94.7 fL (ref 78.0–100.0)
Platelets: 79 10*3/uL — ABNORMAL LOW (ref 150–400)
RBC: 3.2 MIL/uL — AB (ref 4.22–5.81)
RDW: 13.8 % (ref 11.5–15.5)
WBC: 7.8 10*3/uL (ref 4.0–10.5)

## 2015-06-08 LAB — BASIC METABOLIC PANEL
Anion gap: 10 (ref 5–15)
BUN: 47 mg/dL — AB (ref 6–20)
CHLORIDE: 106 mmol/L (ref 101–111)
CO2: 21 mmol/L — AB (ref 22–32)
CREATININE: 2.27 mg/dL — AB (ref 0.61–1.24)
Calcium: 7 mg/dL — ABNORMAL LOW (ref 8.9–10.3)
GFR calc Af Amer: 31 mL/min — ABNORMAL LOW (ref >60)
GFR calc non Af Amer: 27 mL/min — ABNORMAL LOW (ref >60)
GLUCOSE: 103 mg/dL — AB (ref 65–99)
POTASSIUM: 3.3 mmol/L — AB (ref 3.5–5.1)
SODIUM: 137 mmol/L (ref 135–145)

## 2015-06-08 LAB — URINE CULTURE

## 2015-06-08 MED ORDER — POTASSIUM CHLORIDE CRYS ER 20 MEQ PO TBCR
40.0000 meq | EXTENDED_RELEASE_TABLET | Freq: Once | ORAL | Status: AC
  Administered 2015-06-08: 40 meq via ORAL
  Filled 2015-06-08: qty 2

## 2015-06-08 MED ORDER — DEXTROSE 5 % IV SOLN
500.0000 mg | Freq: Two times a day (BID) | INTRAVENOUS | Status: AC
  Administered 2015-06-08 (×2): 500 mg via INTRAVENOUS
  Filled 2015-06-08 (×2): qty 5

## 2015-06-08 MED ORDER — CEPHALEXIN 250 MG PO CAPS
250.0000 mg | ORAL_CAPSULE | Freq: Two times a day (BID) | ORAL | Status: DC
  Administered 2015-06-09 (×2): 250 mg via ORAL
  Filled 2015-06-08 (×4): qty 1

## 2015-06-08 MED ORDER — ZOLPIDEM TARTRATE 5 MG PO TABS: 5.0000 mg | ORAL_TABLET | Freq: Every evening | ORAL | Status: DC | PRN

## 2015-06-08 NOTE — Consult Note (Cosign Needed)
1 Day Post-Op   Subjective: S/p left ureteral stent placement yesterday afternoon. Has been AF with Tmax of 100.4 yesterday AM. Other VSS. WBC continues to improve to 7.8 from 9.8, Cr slightly up to 2.27 from 2.13 today. 5/7 UCx with >100K GNRs, 5/8 UCx pending. Reports he is doing well with resolution of pain. Eating well.   Objective: Vital signs in last 24 hours: Temp:  [98.4 F (36.9 C)-100.4 F (38 C)] 98.6 F (37 C) (05/09 0527) Pulse Rate:  [65-86] 77 (05/09 0527) Resp:  [10-21] 16 (05/09 0527) BP: (92-111)/(56-78) 108/65 mmHg (05/09 0527) SpO2:  [90 %-99 %] 95 % (05/09 0527)  Intake/Output from previous day: 05/08 0701 - 05/09 0700 In: 1465 [I.V.:1415; IV Piggyback:50] Out: 1055 [Urine:1050; Blood:5] Intake/Output this shift: Total I/O In: -  Out: 525 [Urine:525]  Physical Exam:  General:alert, cooperative and appears stated age GI: soft, non tender, normal bowel sounds, no palpable masses, no organomegaly, no inguinal hernia Male genitalia: no CVA tenderness. Indwelling foley catheter with cloudy appearing urine.  Resp: no increased work of breathing on room air Extremities: no LE edema  Lab Results:  Recent Labs  06/06/15 0937 06/07/15 0503 06/08/15 0509  HGB 13.5 11.5* 10.6*  HCT 40.0 32.6* 30.3*   BMET  Recent Labs  06/07/15 0503 06/08/15 0509  NA 136 137  K 3.3* 3.3*  CL 102 106  CO2 24 21*  GLUCOSE 115* 103*  BUN 48* 47*  CREATININE 2.13* 2.27*  CALCIUM 7.7* 7.0*   No results for input(s): LABPT, INR in the last 72 hours. No results for input(s): LABURIN in the last 72 hours. Results for orders placed or performed during the hospital encounter of 06/06/15  Culture, Urine     Status: Abnormal (Preliminary result)   Collection Time: 06/06/15 11:26 AM  Result Value Ref Range Status   Specimen Description URINE, RANDOM  Final   Special Requests NONE  Final   Culture >=100,000 COLONIES/mL GRAM NEGATIVE RODS (A)  Final   Report Status PENDING   Incomplete  Surgical pcr screen     Status: None   Collection Time: 06/07/15 11:58 AM  Result Value Ref Range Status   MRSA, PCR NEGATIVE NEGATIVE Final   Staphylococcus aureus NEGATIVE NEGATIVE Final    Comment:        The Xpert SA Assay (FDA approved for NASAL specimens in patients over 27 years of age), is one component of a comprehensive surveillance program.  Test performance has been validated by [facility] for patients greater than or equal to 74 year old. It is not intended to diagnose infection nor to guide or monitor treatment.     Studies/Results: Ct Abdomen Pelvis Wo Contrast  06/06/2015  CLINICAL DATA:  Patient with acute left lower quadrant pain, nausea and vomiting. EXAM: CT ABDOMEN AND PELVIS WITHOUT CONTRAST TECHNIQUE: Multidetector CT imaging of the abdomen and pelvis was performed following the standard protocol without IV contrast. COMPARISON:  CT abdomen pelvis 06/04/2014 FINDINGS: Lower chest: Small hiatal hernia. Normal heart size. Small left pleural effusion. Ground-glass and consolidative opacities within the left lower lobe and lingula. Hepatobiliary: Liver is normal in size and contour. Liver is diffusely low in attenuation compatible with steatosis. There is a stone within the gallbladder lumen. No gallbladder wall thickening. Pancreas: Unremarkable Spleen: Unremarkable Adrenals/Urinary Tract: The adrenal glands are normal. There is a 6 mm obstructing stone within the distal left ureter (image 60; series 201) resulting in moderate left hydroureteronephrosis. The left kidney  is enlarged and there is perinephric fat stranding and fluid. The right kidney is normal in size. Multiple 2- 3 mm stones within the right kidney. No right ureterolithiasis. Urinary bladder is decompressed. Stomach/Bowel: No abnormal bowel wall thickening or evidence for bowel obstruction. Normal morphology to the stomach. Vascular/Lymphatic: Tortuous yet normal caliber abdominal aorta with  peripheral calcified atherosclerotic plaque. Ectatic right iliac artery. No retroperitoneal lymphadenopathy. Other: Bilateral fat containing inguinal hernias. Musculoskeletal: Lumbar spine degenerative changes. No aggressive or acute appearing osseous lesions. IMPRESSION: There is an obstructing 6 mm stone within the distal left ureter resulting in moderate left hydroureteronephrosis, enlargement of the left kidney and left perinephric fat stranding and fluid. Additional nonobstructing stones within the inferior pole of the right kidney measuring approximately 2-3 mm. Small left pleural effusion with underlying consolidation within the left lower lobe and lingula which may represent atelectasis or infection. Hepatic steatosis. Electronically Signed   By: Lovey Newcomer M.D.   On: 06/06/2015 14:36    Assessment/Plan: 75 y.o. male with left ureteral stone, UA with possible evidence of infection with TNTC WBCs, Tmax of 38.5 (101.3) yesterday evening without associated tachycardia or hypotension. WBC did improve from 15 to 9 this AM with zosyn and ceftriaxone. Now s/p L ureteral stent placement on 06/07/15 & foley catheter placement. 5/7 UCx with >100K GNRs, 5/8 intra-op UCx pending. Creatinine is up slightly today to 2.27 - likely 2/2 Zosyn. Has been AF with stable VS for 24 hours.    Follow up urine cultures  Narrow ABx and transition to PO antibiotics based on sensitivites  Continue to trend creatinine - would need to have it start trending back to baseline prior to discharge  [place] to D/C indwelling catheter and convert to condom catheter  Will need to be scheduled for left ureteroscopic stone extraction as an outpatient. This can be scheduled in the next 1-2 months following treatment of his aneurysm. We will coordinate this.   LOS: 2 days   Acie Fredrickson 06/08/2015, 6:43 AM   I have seen and examined the patient and agree with the above assessment and plan.  Await final culture regarding  appropriate antibiotic therapy for discharge.  Will need a total of 10-14 days of culture specific antibiotics.  Renal function not improved after stent suggesting that this is likely not obstructive.  Definitive treatment of his stone can be delayed for 1-2 months following treatment of his aneurysm.  Will continue to follow.

## 2015-06-08 NOTE — Progress Notes (Signed)
Patient febrile to 102.1 then down to 101 after Tylenol.  Having diarrhea, but no abdominal pain, WBC normal - c.diff suspicion is low.  Urine culture growing citrobacter sensitive to ancef which he is on.  CT demonstrates concern for PNA vs. Atelectasis.  Fever likely pyelonephritis vs. PNA.  Will continue with current management - defer any additional treatment decisions to hospital medicine.

## 2015-06-08 NOTE — Progress Notes (Addendum)
PROGRESS NOTE    Lance Dillon  [id number] DOB: 12-14-1940 DOA: 06/06/2015 PCP: Thressa Sheller, MD  Outpatient Specialists: Dr.Borden Brief Narrative:74/M with Htn, dyslipidema, admitted with Sepsis, pyelonephritis and obstrcuting ureteral stone, improving on Abx, s/p Cysto and stent placement 5/8  Assessment & Plan:  Citrobacter Sepsis due to obstructing left ureteral stone with associated hydronephrosis and pyelonephritis.  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 Baseline creatinine 1.1 4, up to 2.2  -due to sepsis, hydronephrosis and hyzaar -cut down IVF, s/p Ureteral stenting -Follow Bmet  PVD. 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 Per wife patient scheduled for intervention of cerebral aneurysm later this month.   Hypertension.  -hold amlodipine and hyzaar, continue metoprolol  -Bp improved  Hyperlipidemia -Continue home Crestor  Thrombocytopenia -due to sepsis, monitor  DVT prophylaxis: SCDs due to low platelets Code Status: Full code  Family Communication: Patient's wife at bedside.  Disposition Plan: Home in 1-2days  Consultants:    Urology   Procedures:   Antimicrobials:Zosyn   Subjective: Feels nauseated, unable to eat much, but overall improving, pain resolved  Objective: Filed Vitals:   06/07/15 1530 06/07/15 1547 06/07/15 2145 06/08/15 0527  BP: 109/69 107/69 110/64 108/65  Pulse: 72 75 80 77  Temp: 99.4 F (37.4 C) 99.2 F (37.3 C) 100 F (37.8 C) 98.6 F (37 C)  TempSrc:   Oral Oral  Resp: 15 14 16 16   Height:      Weight:      SpO2: 96% 94% 97% 95%    Intake/Output Summary (Last 24 hours) at 06/08/15 1058 Last data filed at 06/08/15 0600  Gross per 24 hour  Intake   2405 ml  Output   1055 ml  Net   1350 ml   Filed Weights   06/06/15 1823  Weight: 75.5 kg (166 lb 7.2 oz)    Examination:  General exam: Appears calm and comfortable, no distress Respiratory system: Clear to auscultation. Respiratory effort normal. Cardiovascular system: S1 & S2 heard, RRR. No JVD, murmurs, rubs, gallops or clicks. No pedal edema. Gastrointestinal system: Abdomen is nondistended, soft and mild L flank tenderness. No organomegaly or masses felt. Normal bowel sounds Central nervous system: Alert and oriented. No focal neurological deficits. Extremities: Symmetric 5 x 5 power. Skin: No rashes, lesions or ulcers Psychiatry: Judgement and insight appear normal. Mood & affect appropriate.     Data Reviewed: I have personally reviewed following labs and imaging studies  CBC:  Recent Labs Lab 06/06/15 0937 06/07/15 0503 06/08/15 0509  WBC 15.7* 9.8 7.8  HGB 13.5 11.5* 10.6*  HCT 40.0 32.6* 30.3*  MCV 95.0 94.8 94.7  PLT 144* 103* 79*   Basic Metabolic Panel:  Recent Labs Lab 06/06/15 0937 06/07/15  0503 06/08/15 0509  NA 135 136 137  K 3.8 3.3* 3.3*  CL 98* 102 106  CO2 22 24 21*  GLUCOSE 160* 115* 103*  BUN 43* 48* 47*  CREATININE 2.22* 2.13* 2.27*  CALCIUM 9.0 7.7* 7.0*   GFR: Estimated Creatinine Clearance: 28.5 mL/min (by C-G formula based on Cr of 2.27). Liver Function  Tests:  Recent Labs Lab 06/06/15 0937  AST 45*  ALT 36  ALKPHOS 44  BILITOT 2.6*  PROT 6.9  ALBUMIN 3.4*    Recent Labs Lab 06/06/15 0937  LIPASE 15   No results for input(s): AMMONIA in the last 168 hours. Coagulation Profile: No results for input(s): INR, PROTIME in the last 168 hours. Cardiac Enzymes: No results for input(s): CKTOTAL, CKMB, CKMBINDEX, TROPONINI in the last 168 hours. BNP (last 3 results) No results for input(s): PROBNP in the last 8760 hours. HbA1C: No results for input(s): HGBA1C in the last 72 hours. CBG: No results for input(s): GLUCAP in the last 168 hours. Lipid Profile: No results for input(s): CHOL, HDL, LDLCALC, TRIG, CHOLHDL, LDLDIRECT in the last 72 hours. Thyroid Function Tests: No results for input(s): TSH, T4TOTAL, FREET4, T3FREE, THYROIDAB in the last 72 hours. Anemia Panel: No results for input(s): VITAMINB12, FOLATE, FERRITIN, TIBC, IRON, RETICCTPCT in the last 72 hours. Urine analysis:    Component Value Date/Time   COLORURINE AMBER* 06/06/2015 1142   APPEARANCEUR TURBID* 06/06/2015 1142   LABSPEC 1.022 06/06/2015 1142   PHURINE 5.5 06/06/2015 1142   GLUCOSEU NEGATIVE 06/06/2015 1142   HGBUR MODERATE* 06/06/2015 1142   BILIRUBINUR NEGATIVE 06/06/2015 1142   KETONESUR NEGATIVE 06/06/2015 1142   PROTEINUR 100* 06/06/2015 1142   UROBILINOGEN 1.0 07/13/2011 0844   NITRITE NEGATIVE 06/06/2015 1142   LEUKOCYTESUR MODERATE* 06/06/2015 1142   Sepsis Labs: @LABRCNTIP (procalcitonin:4,lacticidven:4)  ) Recent Results (from the past 240 hour(s))  Culture, Urine     Status: Abnormal   Collection Time: 06/06/15 11:26 AM  Result Value Ref Range Status   Specimen Description URINE, RANDOM  Final   Special Requests NONE  Final   Culture >=100,000 COLONIES/mL CITROBACTER KOSERI (A)  Final   Report Status 06/08/2015 FINAL  Final   Organism ID, Bacteria CITROBACTER KOSERI (A)  Final      Susceptibility   Citrobacter koseri - MIC*     CEFAZOLIN <=4 SENSITIVE Sensitive     CEFTRIAXONE <=1 SENSITIVE Sensitive     CIPROFLOXACIN <=0.25 SENSITIVE Sensitive     GENTAMICIN <=1 SENSITIVE Sensitive     IMIPENEM <=0.25 SENSITIVE Sensitive     NITROFURANTOIN 32 SENSITIVE Sensitive     TRIMETH/SULFA <=20 SENSITIVE Sensitive     PIP/TAZO <=4 SENSITIVE Sensitive     * >=100,000 COLONIES/mL CITROBACTER KOSERI  Surgical pcr screen     Status: None   Collection Time: 06/07/15 11:58 AM  Result Value Ref Range Status   MRSA, PCR NEGATIVE NEGATIVE Final   Staphylococcus aureus NEGATIVE NEGATIVE Final    Comment:        The Xpert SA Assay (FDA approved for NASAL specimens in patients over 62 years of age), is one component of a comprehensive surveillance program.  Test performance has been validated by [facility] for patients greater than or equal to 10 year old. It is not intended to diagnose infection nor to guide or monitor treatment.   Urine culture     Status: None (Preliminary result)   Collection Time: 06/07/15  1:53 PM  Result Value Ref Range Status  Specimen Description CYSTOSCOPY  Final   Special Requests NONE  Final   Culture   Final    TOO YOUNG TO READ Performed at [facility]    Report Status PENDING  Incomplete         Radiology Studies: Ct Abdomen Pelvis Wo Contrast  06/06/2015  CLINICAL DATA:  Patient with acute left lower quadrant pain, nausea and vomiting. EXAM: CT ABDOMEN AND PELVIS WITHOUT CONTRAST TECHNIQUE: Multidetector CT imaging of the abdomen and pelvis was performed following the standard protocol without IV contrast. COMPARISON:  CT abdomen pelvis 06/04/2014 FINDINGS: Lower chest: Small hiatal hernia. Normal heart size. Small left pleural effusion. Ground-glass and consolidative opacities within the left lower lobe and lingula. Hepatobiliary: Liver is normal in size and contour. Liver is diffusely low in attenuation compatible with steatosis. There is a stone within the gallbladder  lumen. No gallbladder wall thickening. Pancreas: Unremarkable Spleen: Unremarkable Adrenals/Urinary Tract: The adrenal glands are normal. There is a 6 mm obstructing stone within the distal left ureter (image 60; series 201) resulting in moderate left hydroureteronephrosis. The left kidney is enlarged and there is perinephric fat stranding and fluid. The right kidney is normal in size. Multiple 2- 3 mm stones within the right kidney. No right ureterolithiasis. Urinary bladder is decompressed. Stomach/Bowel: No abnormal bowel wall thickening or evidence for bowel obstruction. Normal morphology to the stomach. Vascular/Lymphatic: Tortuous yet normal caliber abdominal aorta with peripheral calcified atherosclerotic plaque. Ectatic right iliac artery. No retroperitoneal lymphadenopathy. Other: Bilateral fat containing inguinal hernias. Musculoskeletal: Lumbar spine degenerative changes. No aggressive or acute appearing osseous lesions. IMPRESSION: There is an obstructing 6 mm stone within the distal left ureter resulting in moderate left hydroureteronephrosis, enlargement of the left kidney and left perinephric fat stranding and fluid. Additional nonobstructing stones within the inferior pole of the right kidney measuring approximately 2-3 mm. Small left pleural effusion with underlying consolidation within the left lower lobe and lingula which may represent atelectasis or infection. Hepatic steatosis. Electronically Signed   By: Lovey Newcomer M.D.   On: 06/06/2015 14:36        Scheduled Meds: . antiseptic oral rinse  7 mL Mouth Rinse BID  . aspirin  81 mg Oral Daily  .  ceFAZolin (ANCEF) IV  500 mg Intravenous Q12H  . [START ON 06/09/2015] cephALEXin  250 mg Oral Q12H  . latanoprost  1 drop Left Eye QHS  . metoprolol succinate  25 mg Oral Daily  . polyethylene glycol  17 g Oral QHS  . rosuvastatin  10 mg Oral QHS   Continuous Infusions: . sodium chloride 75 mL/hr (06/08/15 0911)     LOS: 2 days     Time spent: 36min    Domenic Polite, MD [facility] Pager [phone]  If 7PM-7AM, please contact night-coverage [email or web address] Password [email or web address] 06/08/2015, 10:58 AM

## 2015-06-08 NOTE — Progress Notes (Signed)
Pharmacy Antibiotic Note  Lance Dillon is a 75 y.o. male admitted on 06/06/2015 with pyelonephritis, possible CAP with SIRS on presentation.  Pt also with AKI, left obstructing renal stone.  CT also showed Small left pleural effusion with underlying consolidation within the left lower lobe and lingula which may represent atelectasis or infection.  Pharmacy was initially consulted for Vancomycin and Zosyn dosing, then narrowed to Zosyn alone.  Urine culture now growing pan-sensitive Citrobacter, and less likely pulmonary infection.  Plan:  Discussed culture results with Dr. Broadus John  D/C Zosyn  Cefazolin 500mg  IV q12h x2 doses (continue IV therapy today d/t nausea)  Cephalexin 250mg  PO q12h starting 5/10 Follow up renal fxn, culture results, and clinical course.   Height: 5\' 9"  (175.3 cm) Weight: 166 lb 7.2 oz (75.5 kg) IBW/kg (Calculated) : 70.7  Temp (24hrs), Avg:99.3 F (37.4 C), Min:98.4 F (36.9 C), Max:100.4 F (38 C)   Recent Labs Lab 06/06/15 0937 06/06/15 0953 06/06/15 1232 06/06/15 1711 06/07/15 0503 06/08/15 0509  WBC 15.7*  --   --   --  9.8 7.8  CREATININE 2.22*  --   --   --  2.13* 2.27*  LATICACIDVEN  --  3.18* 1.83 1.44  --   --     Estimated Creatinine Clearance: 28.5 mL/min (by C-G formula based on Cr of 2.27).    Allergies  Allergen Reactions  . Avelox [Moxifloxacin Hcl In Nacl] Diarrhea    diarrhea    Antimicrobials this admission: 5/7 Ceftriaxone x 1 5/7 Vancomycin >>  5/7 Zosyn >> 5/9 5/9 Cefazolin >> (5/10) 5/10 Cephalexin >>  Dose adjustments this admission: -  Microbiology results: 5/7 UCx: >100k Citrobacter Koseri (pan-sens) Sputum: ordered 5/8 strep pneumo ur ag: negative 5/8 MRSA PCR: negative 5/8 UCx (cystoscopy): sent  Thank you for allowing pharmacy to be a part of this patient's care.  Gretta Arab PharmD, BCPS Pager [phone] 06/08/2015 10:43 AM

## 2015-06-09 DIAGNOSIS — I671 Cerebral aneurysm, nonruptured: Secondary | ICD-10-CM

## 2015-06-09 DIAGNOSIS — I739 Peripheral vascular disease, unspecified: Secondary | ICD-10-CM

## 2015-06-09 DIAGNOSIS — E785 Hyperlipidemia, unspecified: Secondary | ICD-10-CM

## 2015-06-09 LAB — CBC
HCT: 31.3 % — ABNORMAL LOW (ref 39.0–52.0)
Hemoglobin: 10.7 g/dL — ABNORMAL LOW (ref 13.0–17.0)
MCH: 32.4 pg (ref 26.0–34.0)
MCHC: 34.2 g/dL (ref 30.0–36.0)
MCV: 94.8 fL (ref 78.0–100.0)
PLATELETS: 97 10*3/uL — AB (ref 150–400)
RBC: 3.3 MIL/uL — ABNORMAL LOW (ref 4.22–5.81)
RDW: 14.1 % (ref 11.5–15.5)
WBC: 7.6 10*3/uL (ref 4.0–10.5)

## 2015-06-09 LAB — GASTROINTESTINAL PANEL BY PCR, STOOL (REPLACES STOOL CULTURE)
ASTROVIRUS: NOT DETECTED
Adenovirus F40/41: NOT DETECTED
Campylobacter species: NOT DETECTED
Cryptosporidium: NOT DETECTED
Cyclospora cayetanensis: NOT DETECTED
E. COLI O157: NOT DETECTED
ENTAMOEBA HISTOLYTICA: NOT DETECTED
ENTEROAGGREGATIVE E COLI (EAEC): NOT DETECTED
ENTEROTOXIGENIC E COLI (ETEC): NOT DETECTED
Enteropathogenic E coli (EPEC): NOT DETECTED
Giardia lamblia: NOT DETECTED
NOROVIRUS GI/GII: NOT DETECTED
Plesimonas shigelloides: NOT DETECTED
Rotavirus A: NOT DETECTED
SAPOVIRUS (I, II, IV, AND V): NOT DETECTED
Salmonella species: NOT DETECTED
Shiga like toxin producing E coli (STEC): NOT DETECTED
Shigella/Enteroinvasive E coli (EIEC): NOT DETECTED
VIBRIO CHOLERAE: NOT DETECTED
Vibrio species: NOT DETECTED
Yersinia enterocolitica: NOT DETECTED

## 2015-06-09 LAB — BASIC METABOLIC PANEL
ANION GAP: 11 (ref 5–15)
BUN: 46 mg/dL — ABNORMAL HIGH (ref 6–20)
CALCIUM: 7.1 mg/dL — AB (ref 8.9–10.3)
CO2: 21 mmol/L — ABNORMAL LOW (ref 22–32)
Chloride: 108 mmol/L (ref 101–111)
Creatinine, Ser: 1.89 mg/dL — ABNORMAL HIGH (ref 0.61–1.24)
GFR, EST AFRICAN AMERICAN: 39 mL/min — AB (ref >60)
GFR, EST NON AFRICAN AMERICAN: 33 mL/min — AB (ref >60)
GLUCOSE: 101 mg/dL — AB (ref 65–99)
Potassium: 3.6 mmol/L (ref 3.5–5.1)
Sodium: 140 mmol/L (ref 135–145)

## 2015-06-09 LAB — C DIFFICILE QUICK SCREEN W PCR REFLEX
C Diff antigen: NEGATIVE
C Diff interpretation: NEGATIVE
C Diff toxin: NEGATIVE

## 2015-06-09 MED ORDER — SACCHAROMYCES BOULARDII 250 MG PO CAPS
250.0000 mg | ORAL_CAPSULE | Freq: Two times a day (BID) | ORAL | Status: DC
  Administered 2015-06-09 – 2015-06-10 (×3): 250 mg via ORAL
  Filled 2015-06-09 (×3): qty 1

## 2015-06-09 MED ORDER — PROCHLORPERAZINE EDISYLATE 5 MG/ML IJ SOLN
10.0000 mg | INTRAMUSCULAR | Status: DC | PRN
  Administered 2015-06-09 – 2015-06-10 (×2): 10 mg via INTRAVENOUS
  Filled 2015-06-09 (×2): qty 2

## 2015-06-09 MED ORDER — POTASSIUM CHLORIDE CRYS ER 20 MEQ PO TBCR
40.0000 meq | EXTENDED_RELEASE_TABLET | Freq: Once | ORAL | Status: AC
  Administered 2015-06-09: 40 meq via ORAL
  Filled 2015-06-09: qty 2

## 2015-06-09 NOTE — Progress Notes (Signed)
This RN is taking over nursing care for the patient and agrees with the previous RN's assessment. Will continue to monitor.  Koren Plyler, RN

## 2015-06-09 NOTE — Progress Notes (Signed)
PROGRESS NOTE    Lance Dillon  [id number] DOB: 1940-04-05 DOA: 06/06/2015 PCP: Thressa Sheller, MD  Outpatient Specialists: Dr.Borden  Subjective: Continues to have fever, temp of 102.1 yesterday. Also continued to have significant diarrhea.  Brief Narrative:  74/M with Htn, dyslipidema, admitted with Sepsis, pyelonephritis and obstrcuting ureteral stone, improving on Abx, s/p Cysto and stent placement 5/8  Assessment & Plan:  Sepsis due to obstructing left ureteral stone with associated hydronephrosis and pyelonephritis.  -Initially was on IV fluids and Zosyn, stent placed on 06/07/2015. -Urine culture showing Citrobacter koseri, pansensitive antibiotic switched to Keflex, still having fevers. -Repeat cultures showed no significant growth, continue Keflex and watch.  Diarrhea -Complains about greenish diarrhea since yesterday, several episodes. -C. difficile toxin is negative, will start Florastor.  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 Baseline creatinine 1.1, presented with creatinine of 2.2  -due to sepsis, hydronephrosis and hyzaar -Continue IV fluids, this is improving.  PVD. 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 Per wife patient scheduled for intervention of cerebral aneurysm later this month.   Hypertension.  -hold amlodipine and hyzaar, continue metoprolol to avoid rebound tachycardia if BP permits  Hyperlipidemia -Continue home Crestor   DVT prophylaxis: SCDs. Code Status: Full code  Family Communication: Patient's wife at bedside.  Disposition Plan:  Home when improved, hopefully few days  Consultants:  Urology   Procedures: Left retrograde ureterogram and insertion of left ureteral stent done by Dr. Matilde Sprang on 06/07/2015   Antimicrobials:Zosyn  Objective: Filed Vitals:   06/08/15 2126 06/08/15 2234 06/09/15 0341 06/09/15 0714  BP: 122/72   129/71  Pulse: 71   69  Temp: 100.3 F (37.9 C) 100.2 F (37.9 C) 99.5 F (37.5 C) 98.6 F (37 C)  TempSrc: Rectal Rectal Rectal Oral  Resp: 22   19  Height:      Weight:      SpO2: 94%   96%    Intake/Output Summary (Last 24 hours) at 06/09/15 1105 Last data filed at 06/09/15 0900  Gross per 24 hour  Intake 2314.58 ml  Output    700 ml  Net 1614.58 ml   Filed Weights   06/06/15 1823  Weight: 75.5 kg (166 lb 7.2 oz)    Examination:  General exam: Appears calm and comfortable, no distress Respiratory system: Clear to auscultation. Respiratory effort normal. Cardiovascular system: S1 & S2 heard, RRR. No JVD, murmurs, rubs, gallops or clicks. No pedal edema. Gastrointestinal system: Abdomen is nondistended, soft and mild L flank tenderness. No organomegaly or masses felt. Normal bowel sounds Central nervous system: Alert and oriented. No focal neurological deficits. Extremities: Symmetric 5 x 5 power. Skin: No rashes, lesions or ulcers Psychiatry: Judgement and insight appear normal. Mood & affect appropriate.     Data Reviewed: I have personally reviewed following labs and imaging studies  CBC:  Recent Labs Lab 06/06/15 0937 06/07/15 0503 06/08/15 0509 06/09/15 0524  WBC 15.7* 9.8 7.8 7.6  HGB 13.5 11.5* 10.6* 10.7*  HCT 40.0 32.6* 30.3* 31.3*  MCV 95.0 94.8 94.7 94.8  PLT 144* 103* 79* 97*   Basic Metabolic Panel:  Recent Labs  Lab 06/06/15 0937 06/07/15 0503 06/08/15 0509 06/09/15 0524  NA 135 136 137 140  K 3.8 3.3* 3.3* 3.6  CL 98* 102 106 108  CO2 22 24 21* 21*  GLUCOSE 160* 115* 103* 101*  BUN 43* 48* 47* 46*  CREATININE 2.22* 2.13* 2.27*  1.89*  CALCIUM 9.0 7.7* 7.0* 7.1*   GFR: Estimated Creatinine Clearance: 34.3 mL/min (by C-G formula based on Cr of 1.89). Liver Function Tests:  Recent Labs Lab 06/06/15 0937  AST 45*  ALT 36  ALKPHOS 44  BILITOT 2.6*  PROT 6.9  ALBUMIN 3.4*    Recent Labs Lab 06/06/15 0937  LIPASE 15   No results for input(s): AMMONIA in the last 168 hours. Coagulation Profile: No results for input(s): INR, PROTIME in the last 168 hours. Cardiac Enzymes: No results for input(s): CKTOTAL, CKMB, CKMBINDEX, TROPONINI in the last 168 hours. BNP (last 3 results) No results for input(s): PROBNP in the last 8760 hours. HbA1C: No results for input(s): HGBA1C in the last 72 hours. CBG: No results for input(s): GLUCAP in the last 168 hours. Lipid Profile: No results for input(s): CHOL, HDL, LDLCALC, TRIG, CHOLHDL, LDLDIRECT in the last 72 hours. Thyroid Function Tests: No results for input(s): TSH, T4TOTAL, FREET4, T3FREE, THYROIDAB in the last 72 hours. Anemia Panel: No results for input(s): VITAMINB12, FOLATE, FERRITIN, TIBC, IRON, RETICCTPCT in the last 72 hours. Urine analysis:    Component Value Date/Time   COLORURINE AMBER* 06/06/2015 1142   APPEARANCEUR TURBID* 06/06/2015 1142   LABSPEC 1.022 06/06/2015 1142   PHURINE 5.5 06/06/2015 1142   GLUCOSEU NEGATIVE 06/06/2015 1142   HGBUR MODERATE* 06/06/2015 1142   BILIRUBINUR NEGATIVE 06/06/2015 1142   KETONESUR NEGATIVE 06/06/2015 1142   PROTEINUR 100* 06/06/2015 1142   UROBILINOGEN 1.0 07/13/2011 0844   NITRITE NEGATIVE 06/06/2015 1142   LEUKOCYTESUR MODERATE* 06/06/2015 1142   Sepsis Labs:  Recent Results (from the past 240 hour(s))  Culture, Urine     Status: Abnormal   Collection Time: 06/06/15 11:26 AM  Result Value Ref Range Status   Specimen Description URINE, RANDOM  Final   Special Requests NONE  Final   Culture >=100,000 COLONIES/mL CITROBACTER KOSERI (A)  Final   Report Status 06/08/2015 FINAL  Final   Organism  ID, Bacteria CITROBACTER KOSERI (A)  Final      Susceptibility   Citrobacter koseri - MIC*    CEFAZOLIN <=4 SENSITIVE Sensitive     CEFTRIAXONE <=1 SENSITIVE Sensitive     CIPROFLOXACIN <=0.25 SENSITIVE Sensitive     GENTAMICIN <=1 SENSITIVE Sensitive     IMIPENEM <=0.25 SENSITIVE Sensitive     NITROFURANTOIN 32 SENSITIVE Sensitive     TRIMETH/SULFA <=20 SENSITIVE Sensitive     PIP/TAZO <=4 SENSITIVE Sensitive     * >=100,000 COLONIES/mL CITROBACTER KOSERI  Surgical pcr screen     Status: None   Collection Time: 06/07/15 11:58 AM  Result Value Ref Range Status   MRSA, PCR NEGATIVE NEGATIVE Final   Staphylococcus aureus NEGATIVE NEGATIVE Final    Comment:        The Xpert SA Assay (FDA approved for NASAL specimens in patients over 105 years of age), is one component of a comprehensive surveillance program.  Test performance has been validated by [facility] for patients greater than or equal to 48 year old. It is not intended to diagnose infection nor to guide or monitor treatment.   Urine culture     Status: Abnormal (Preliminary result)   Collection  Time: 06/07/15  1:53 PM  Result Value Ref Range Status   Specimen Description CYSTOSCOPY  Final   Special Requests NONE  Final   Culture 4,000 COLONIES/mL GRAM NEGATIVE RODS (A)  Final   Report Status PENDING  Incomplete  C difficile quick scan w PCR reflex     Status: None   Collection Time: 06/09/15  8:55 AM  Result Value Ref Range Status   C Diff antigen NEGATIVE NEGATIVE Final   C Diff toxin NEGATIVE NEGATIVE Final   C Diff interpretation Negative for toxigenic C. difficile  Final    Radiology Studies: No results found.  Scheduled Meds: . antiseptic oral rinse  7 mL Mouth Rinse BID  . aspirin  81 mg Oral Daily  . cephALEXin  250 mg Oral Q12H  . latanoprost  1 drop Left Eye QHS  . metoprolol succinate  25 mg Oral Daily  . polyethylene glycol  17 g Oral QHS  . rosuvastatin  10 mg Oral QHS   Continuous  Infusions: . sodium chloride 75 mL/hr at 06/09/15 0333     LOS: 3 days    Time spent: 39min    [facility] A, MD [facility] Pager [phone]  If 7PM-7AM, please contact night-coverage [email or web address] Password [email or web address] 06/09/2015, 11:05 AM

## 2015-06-09 NOTE — Progress Notes (Signed)
Patient ID: Lance Dillon, male   DOB: 1940-04-22, 75 y.o.   MRN: [id number]  2 Days Post-Op Subjective: Pt had fever to 102 yesterday but improved overnight.  Left flank pain still present but improved. Pt complains of severe diarrhea with dark stool for the last couple of days.  Objective: Vital signs in last 24 hours: Temp:  [99.5 F (37.5 C)-102.1 F (38.9 C)] 99.5 F (37.5 C) (05/10 0341) Pulse Rate:  [71-75] 71 (05/09 2126) Resp:  [18-22] 22 (05/09 2126) BP: (113-122)/(68-72) 122/72 mmHg (05/09 2126) SpO2:  [94 %-97 %] 94 % (05/09 2126)  Intake/Output from previous day: 05/09 0701 - 05/10 0700 In: 1354.6 [I.V.:1354.6] Out: 700 [Urine:700] Intake/Output this shift:    Physical Exam:  General: Alert and oriented Abdomen: Soft, ND, Mild to moderate L CVAT  Lab Results:  Recent Labs  06/07/15 0503 06/08/15 0509 06/09/15 0524  HGB 11.5* 10.6* 10.7*  HCT 32.6* 30.3* 31.3*   Lab Results  Component Value Date   WBC 7.6 06/09/2015   HGB 10.7* 06/09/2015   HCT 31.3* 06/09/2015   MCV 94.8 06/09/2015   PLT 97* 06/09/2015     BMET  Recent Labs  06/08/15 0509 06/09/15 0524  NA 137 140  K 3.3* 3.6  CL 106 108  CO2 21* 21*  GLUCOSE 103* 101*  BUN 47* 46*  CREATININE 2.27* 1.89*  CALCIUM 7.0* 7.1*     Studies/Results: Urine culture: Citrobacter - Sensitive to ciprofloxacin, TMP/SMX, cefazolin  Assessment/Plan: 1. Left pyelonephritis with ureteral stone: S/P stent placement. Pt needs 10-14 days of culture specific antibiotics.  TMP/SMX or Keflex would be appropriate.  His allergy to Avelox is only GI distress but would probably avoid fluoroquinolones with current severe diarrhea. He can proceed with his aneurysm procedure later this month from a urologic standpoint. I will arrange outpatient follow up for early to mid June and will consider definitive stone management thereafter.  Can be discharged from urologic standpoint.  2. Diarrhea/fever/dark stool: Will  defer to primary team. ? C. Diff possibly.   LOS: 3 days   Jeymi Hepp,LES 06/09/2015, 7:08 AM

## 2015-06-10 DIAGNOSIS — N182 Chronic kidney disease, stage 2 (mild): Secondary | ICD-10-CM

## 2015-06-10 LAB — URINE CULTURE

## 2015-06-10 LAB — BASIC METABOLIC PANEL
Anion gap: 10 (ref 5–15)
BUN: 37 mg/dL — AB (ref 6–20)
CHLORIDE: 108 mmol/L (ref 101–111)
CO2: 22 mmol/L (ref 22–32)
Calcium: 7.1 mg/dL — ABNORMAL LOW (ref 8.9–10.3)
Creatinine, Ser: 1.6 mg/dL — ABNORMAL HIGH (ref 0.61–1.24)
GFR calc Af Amer: 47 mL/min — ABNORMAL LOW (ref >60)
GFR calc non Af Amer: 41 mL/min — ABNORMAL LOW (ref >60)
GLUCOSE: 119 mg/dL — AB (ref 65–99)
POTASSIUM: 3.9 mmol/L (ref 3.5–5.1)
Sodium: 140 mmol/L (ref 135–145)

## 2015-06-10 LAB — CBC
HEMATOCRIT: 29.4 % — AB (ref 39.0–52.0)
Hemoglobin: 10.3 g/dL — ABNORMAL LOW (ref 13.0–17.0)
MCH: 33 pg (ref 26.0–34.0)
MCHC: 35 g/dL (ref 30.0–36.0)
MCV: 94.2 fL (ref 78.0–100.0)
Platelets: 109 10*3/uL — ABNORMAL LOW (ref 150–400)
RBC: 3.12 MIL/uL — ABNORMAL LOW (ref 4.22–5.81)
RDW: 14.1 % (ref 11.5–15.5)
WBC: 6.1 10*3/uL (ref 4.0–10.5)

## 2015-06-10 MED ORDER — CEPHALEXIN 500 MG PO CAPS
500.0000 mg | ORAL_CAPSULE | Freq: Two times a day (BID) | ORAL | Status: DC
  Administered 2015-06-10: 500 mg via ORAL
  Filled 2015-06-10: qty 1

## 2015-06-10 MED ORDER — SACCHAROMYCES BOULARDII 250 MG PO CAPS: 250.0000 mg | ORAL_CAPSULE | Freq: Two times a day (BID) | ORAL | Status: DC

## 2015-06-10 MED ORDER — CEPHALEXIN 500 MG PO CAPS: 500.0000 mg | ORAL_CAPSULE | Freq: Two times a day (BID) | ORAL | Status: DC

## 2015-06-10 NOTE — Progress Notes (Signed)
Pharmacy Antibiotic Note  Lance Dillon is a 75 y.o. male admitted on 06/06/2015 with pyelonephritis, possible CAP with SIRS on presentation.  Pt also with AKI, left obstructing renal stone.  CT also showed small left pleural effusion with underlying consolidation within the left lower lobe and lingula which may represent atelectasis or infection.  Pharmacy was initially consulted for Vancomycin and Zosyn dosing, then narrowed to Zosyn alone.  Urine culture now growing pan-sensitive Citrobacter, and less likely pulmonary infection. Antibiotics narrowed to Cefazolin x 1 day (d/t nausea) on 5/9, then po Cephalexin beginning 5/10. Pharmacy consulted to renally adjust antibiotics. Temperature curve improved with Tmax of 98.9F in past 24 hours.  Plan:  Adjust Cephalexin dose to 500mg  PO q12h due to improved renal function. Follow up renal fxn, culture results, and clinical course.   Height: 5\' 9"  (175.3 cm) Weight: 166 lb 7.2 oz (75.5 kg) IBW/kg (Calculated) : 70.7  Temp (24hrs), Avg:98.6 F (37 C), Min:98.2 F (36.8 C), Max:98.9 F (37.2 C)   Recent Labs Lab 06/06/15 0937 06/06/15 0953 06/06/15 1232 06/06/15 1711 06/07/15 0503 06/08/15 0509 06/09/15 0524 06/10/15 0359  WBC 15.7*  --   --   --  9.8 7.8 7.6 6.1  CREATININE 2.22*  --   --   --  2.13* 2.27* 1.89* 1.60*  LATICACIDVEN  --  3.18* 1.83 1.44  --   --   --   --     Estimated Creatinine Clearance: 40.5 mL/min (by C-G formula based on Cr of 1.6).    Allergies  Allergen Reactions  . Avelox [Moxifloxacin Hcl In Nacl] Diarrhea    diarrhea    Antimicrobials this admission: 5/7 Ceftriaxone x 1 5/7 Vancomycin >>  5/7 Zosyn >> 5/9 5/9 Cefazolin >> 5/9 5/10 Cephalexin >>  Dose adjustments this admission: 5/11: adjusted Cephalexin dose to 500mg  PO q12h due to improved renal function  Microbiology results: 5/7 UCx: >100k Citrobacter Koseri (pan-sens) Sputum: ordered 5/8 UCx (cystoscopy): 4000 colonies/mL GNR 5/8 strep  pneumo ur ag: negative 5/8 MRSA PCR: negative 5/10 C.diff PCR: negative 5/10: GI panel by PCR, stool: negative  Thank you for allowing pharmacy to be a part of this patient's care.   Lindell Spar, PharmD, BCPS Pager: [phone] 06/10/2015 7:21 AM

## 2015-06-10 NOTE — Discharge Summary (Signed)
Physician Discharge Summary  KEYMANI BETTEN [id number] DOB: 04-27-1940 DOA: 06/06/2015  PCP: Thressa Sheller, MD  Admit date: 06/06/2015 Discharge date: 06/10/2015  Time spent: 40 minutes  Recommendations for Outpatient Follow-up:  1. Follow-up with primary care physician within one week, Dr. Alinda Money in 3-4 weeks. 2. Check CBC/BMP in 1 week. Follow renal function. 3. Hyzaar discontinued, please consider to restart it when creatinine is back to normal.  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 I have personally reviewed the clinical findings, lab, EKG, imaging studies and management of this patient in detail.I have also reviewed the orders written for this patient which were under my direction. I agree with the documentation, as recorded by the Advance Practice Provider.  MARCANTHONY BODIN is a 75 y.o. male with a Past Medical History of htn/hld/pvd who presents with intermittent LLQ abd pain radiating to left flank x 1 wk. Mild dysuria w/o hematuria. Wife at bedside.    Hospital Course:   Sepsis due to obstructing left ureteral stone with associated hydronephrosis and pyelonephritis.  -Initially was on IV fluids and Zosyn, stent placed on 06/07/2015. -Urine culture showing Citrobacter koseri, pansensitive antibiotic switched to Keflex, still having fevers. -Repeat cultures showed no significant growth, continue Keflex and  watch. -Patient discharged on Keflex to complete 2 weeks of antibiotics, to follow-up with Dr. Alinda Money in 3-4 weeks.  Diarrhea -Complains about greenish diarrhea, several episodes. -C. difficile PCR and GI pathogen panel is negative, start an fluoroscopy store. -Felt better this morning, likely antibiotic related diarrhea instructed to consume yogurt and take fluoroscopy store.  Small L effusion and Atelectasis -due to splinting and pain and adjacent inflammation in Left kidney -incentive spirometry, Rx above, no productive cough or symptoms of pneumonia.  AKI superimposed on CKD stage 2. Baseline creatinine 1.1, presented with creatinine of 2.2  -due to sepsis, hydronephrosis and hyzaar -This is improved, not back to normal, creatinine is 1.6, discontinued Hyzaar on discharge. -Please consider to restart Hyzaar when the creatinine is back to normal.  PVD. 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 Per wife patient scheduled for intervention of cerebral aneurysm later this month.   Hypertension.  -hold amlodipine and hyzaar, continue metoprolol to avoid rebound tachycardia if BP permits  Hyperlipidemia -Continue home Crestor   Procedures:  Left ureterogram and insertion of left ureteral stent done by Dr. McDiarmid measured on 06/07/2015  Consultations:  Urology  Discharge Exam: Filed Vitals:   06/09/15 2208 06/10/15 0558  BP: 135/72 126/74  Pulse: 69 64  Temp: 98.6 F (37 C) 98.9 F (37.2 C)  Resp: 20 18   General: Alert and awake, oriented x3, not in any acute distress.  HEENT: anicteric sclera, pupils reactive to light and accommodation, EOMI CVS: S1-S2 clear, no murmur rubs or gallops Chest: clear  to auscultation bilaterally, no wheezing, rales or rhonchi Abdomen: soft nontender, nondistended, normal bowel sounds, no organomegaly Extremities: no cyanosis, clubbing or edema noted bilaterally Neuro: Cranial nerves II-XII intact, no focal neurological deficits  Discharge Instructions   Discharge Instructions    Diet - low sodium heart healthy    Complete by:  As directed      Increase activity slowly    Complete by:  As directed           Current Discharge Medication List    START taking these medications   Details  cephALEXin (KEFLEX) 500 MG capsule Take 1 capsule (500 mg total) by mouth every 12 (twelve) hours. Qty: 20 capsule, Refills: 0    saccharomyces boulardii (FLORASTOR) 250 MG capsule Take 1 capsule (250 mg total) by mouth 2 (two) times daily. Qty: 60 capsule, Refills: 0      CONTINUE these medications which have NOT CHANGED   Details  acetaminophen (TYLENOL) 325 MG tablet Take 650 mg by mouth every 6 (six) hours as needed. For pain    amLODipine (NORVASC) 10 MG tablet Take 10 mg by mouth at bedtime.     aspirin 81 MG tablet Take 81 mg by mouth daily.    Calcium Carbonate-Vitamin D (CALTRATE 600+D PO) Take 1 tablet by mouth 2 (two) times daily.     docusate sodium (COLACE) 100 MG capsule Take 100 mg by mouth at bedtime.    latanoprost (XALATAN) 0.005 % ophthalmic solution Place 1 drop into the left eye at bedtime.     LUTEIN PO Take 1 tablet by mouth daily.    metoprolol succinate (TOPROL-XL) 25 MG 24 hr tablet Take 1 tablet (25 mg total) by mouth daily. Qty: 90 tablet, Refills: 3   Associated Diagnoses: RAS (renal artery stenosis) (H[facility])    Omega-3 Fatty Acids (FISH OIL) 1200 MG CAPS Take 3,600 mg by mouth daily.    rosuvastatin (CRESTOR) 10 MG tablet Take 10 mg by mouth at bedtime.     fluticasone (FLONASE) 50 MCG/ACT nasal spray Place 2 sprays into both nostrils as needed (for nasal congestion).       STOP taking these medications     ibuprofen  (ADVIL,MOTRIN) 200 MG tablet      losartan-hydrochlorothiazide (HYZAAR) 100-12.5 MG per tablet      polyethylene glycol (MIRALAX / GLYCOLAX) packet        Allergies  Allergen Reactions  . Avelox [Moxifloxacin Hcl In Nacl] Diarrhea    diarrhea   Follow-up Information    Follow up with BORDEN,LES, MD In 1 month.   Specialty:  Urology   Contact information:   [place] [address] [phone]        The results of significant diagnostics from this hospitalization (including imaging, microbiology, ancillary and laboratory) are listed below for reference.    Significant Diagnostic Studies: Ct Abdomen Pelvis Wo Contrast  06/06/2015  CLINICAL DATA:  Patient with acute left lower quadrant pain, nausea and vomiting. EXAM: CT ABDOMEN AND PELVIS WITHOUT CONTRAST TECHNIQUE: Multidetector CT imaging of the abdomen and pelvis was performed following the standard protocol without IV contrast. COMPARISON:  CT abdomen pelvis 06/04/2014 FINDINGS: Lower chest: Small hiatal hernia. Normal heart size. Small left pleural effusion. Ground-glass and consolidative opacities within the left lower lobe and lingula. Hepatobiliary: Liver is normal in size and contour. Liver is  diffusely low in attenuation compatible with steatosis. There is a stone within the gallbladder lumen. No gallbladder wall thickening. Pancreas: Unremarkable Spleen: Unremarkable Adrenals/Urinary Tract: The adrenal glands are normal. There is a 6 mm obstructing stone within the distal left ureter (image 60; series 201) resulting in moderate left hydroureteronephrosis. The left kidney is enlarged and there is perinephric fat stranding and fluid. The right kidney is normal in size. Multiple 2- 3 mm stones within the right kidney. No right ureterolithiasis. Urinary bladder is decompressed. Stomach/Bowel: No abnormal bowel wall thickening or evidence for bowel obstruction. Normal morphology to the stomach. Vascular/Lymphatic: Tortuous  yet normal caliber abdominal aorta with peripheral calcified atherosclerotic plaque. Ectatic right iliac artery. No retroperitoneal lymphadenopathy. Other: Bilateral fat containing inguinal hernias. Musculoskeletal: Lumbar spine degenerative changes. No aggressive or acute appearing osseous lesions. IMPRESSION: There is an obstructing 6 mm stone within the distal left ureter resulting in moderate left hydroureteronephrosis, enlargement of the left kidney and left perinephric fat stranding and fluid. Additional nonobstructing stones within the inferior pole of the right kidney measuring approximately 2-3 mm. Small left pleural effusion with underlying consolidation within the left lower lobe and lingula which may represent atelectasis or infection. Hepatic steatosis. Electronically Signed   By: Lovey Newcomer M.D.   On: 06/06/2015 14:36   [facility] Radiologist Eval & Mgmt  05/18/2015  EXAM: ESTABLISHED PATIENT OFFICE VISIT CHIEF COMPLAINT: Remains relatively asymptomatic. 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 The angiographic findings were again reviewed with the patient, his daughter and his spouse. Again brought to their attention was a partial extension of this large posterior communicating artery fusiform aneurysm extending into the origin of the left posterior communicating artery. Also brought to their attention  was the origin of the right posterior cerebral artery distal to the distal aspect of the fusiform aneurysm involving the right posterior communicating artery. Again options considered were those of continued follow-up with surveillance versus attempted endovascular treatment with remodeling of the fusiform aneurysm using a pipeline flow diverting device. The procedure, the risks, the benefits and alternatives were all reviewed in detail. The risk of a 2-3% of a stroke involving the right posterior cerebral artery was reviewed. Other considerations were those of the use of at least two flow diverters given the prominence of the diameter of the fusiform aneurysm. The patient would have to be started on aspirin and Plavix or at least two antiplatelets. Also at the time of the procedure, technical difficulties may force the procedure to be abandoned. 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 An update from his urologist, Dr. Alinda Money may also be useful given his urinary incontinence. Questions were answered to their satisfaction. PHYSICAL EXAMINATION: No change ASSESSMENT AND PLAN: See above note. Electronically Signed   By: Luanne Bras M.D.   On: 05/13/2015 14:53    Microbiology: Recent Results (from  the past 240 hour(s))  Culture, Urine     Status: Abnormal   Collection Time: 06/06/15 11:26 AM  Result Value Ref Range Status   Specimen Description URINE, RANDOM  Final   Special Requests NONE  Final   Culture >=100,000 COLONIES/mL CITROBACTER KOSERI (A)  Final   Report Status 06/08/2015 FINAL  Final   Organism ID, Bacteria CITROBACTER KOSERI (A)  Final      Susceptibility   Citrobacter koseri - MIC*    CEFAZOLIN <=4 SENSITIVE Sensitive     CEFTRIAXONE <=1 SENSITIVE Sensitive     CIPROFLOXACIN <=0.25 SENSITIVE Sensitive     GENTAMICIN <=1 SENSITIVE Sensitive     IMIPENEM <=0.25  SENSITIVE Sensitive     NITROFURANTOIN 32 SENSITIVE Sensitive     TRIMETH/SULFA <=20 SENSITIVE Sensitive     PIP/TAZO <=4 SENSITIVE Sensitive     * >=100,000 COLONIES/mL CITROBACTER KOSERI  Surgical pcr screen     Status: None   Collection Time: 06/07/15 11:58 AM  Result Value Ref Range Status   MRSA, PCR NEGATIVE NEGATIVE Final   Staphylococcus aureus NEGATIVE NEGATIVE Final    Comment:        The Xpert SA Assay (FDA approved for NASAL specimens in patients over 47 years of age), is one component of a comprehensive surveillance program.  Test performance has been validated by [facility] for patients greater than or equal to 58 year old. It is not intended to diagnose infection nor to guide or monitor treatment.   Urine culture     Status: Abnormal   Collection Time: 06/07/15  1:53 PM  Result Value Ref Range Status   Specimen Description CYSTOSCOPY  Final   Special Requests NONE  Final   Culture 4,000 COLONIES/mL CITROBACTER KOSERI (A)  Final   Report Status 06/10/2015 FINAL  Final   Organism ID, Bacteria CITROBACTER KOSERI (A)  Final      Susceptibility   Citrobacter koseri - MIC*    CEFAZOLIN <=4 SENSITIVE Sensitive     CEFEPIME <=1 SENSITIVE Sensitive     CEFTAZIDIME <=1 SENSITIVE Sensitive     CEFTRIAXONE <=1 SENSITIVE Sensitive     CIPROFLOXACIN <=0.25 SENSITIVE Sensitive     GENTAMICIN <=1 SENSITIVE Sensitive     IMIPENEM <=0.25 SENSITIVE Sensitive     TRIMETH/SULFA <=20 SENSITIVE Sensitive     PIP/TAZO <=4 SENSITIVE Sensitive     * 4,000 COLONIES/mL CITROBACTER KOSERI  Gastrointestinal Panel by PCR , Stool     Status: None   Collection Time: 06/09/15  8:55 AM  Result Value Ref Range Status   Campylobacter species NOT DETECTED NOT DETECTED Final   Plesimonas shigelloides NOT DETECTED NOT DETECTED Final   Salmonella species NOT DETECTED NOT DETECTED Final   Yersinia enterocolitica NOT DETECTED NOT DETECTED Final   Vibrio species NOT DETECTED NOT DETECTED Final    Vibrio cholerae NOT DETECTED NOT DETECTED Final   Enteroaggregative E coli (EAEC) NOT DETECTED NOT DETECTED Final   Enteropathogenic E coli (EPEC) NOT DETECTED NOT DETECTED Final   Enterotoxigenic E coli (ETEC) NOT DETECTED NOT DETECTED Final   Shiga like toxin producing E coli (STEC) NOT DETECTED NOT DETECTED Final   E. coli O157 NOT DETECTED NOT DETECTED Final   Shigella/Enteroinvasive E coli (EIEC) NOT DETECTED NOT DETECTED Final   Cryptosporidium NOT DETECTED NOT DETECTED Final   Cyclospora cayetanensis NOT DETECTED NOT DETECTED Final   Entamoeba histolytica NOT DETECTED NOT DETECTED Final   Giardia lamblia NOT DETECTED NOT DETECTED  Final   Adenovirus F40/41 NOT DETECTED NOT DETECTED Final   Astrovirus NOT DETECTED NOT DETECTED Final   Norovirus GI/GII NOT DETECTED NOT DETECTED Final   Rotavirus A NOT DETECTED NOT DETECTED Final   Sapovirus (I, II, IV, and V) NOT DETECTED NOT DETECTED Final  C difficile quick scan w PCR reflex     Status: None   Collection Time: 06/09/15  8:55 AM  Result Value Ref Range Status   C Diff antigen NEGATIVE NEGATIVE Final   C Diff toxin NEGATIVE NEGATIVE Final   C Diff interpretation Negative for toxigenic C. difficile  Final     Labs: Basic Metabolic Panel:  Recent Labs Lab 06/06/15 0937 06/07/15 0503 06/08/15 0509 06/09/15 0524 06/10/15 0359  NA 135 136 137 140 140  K 3.8 3.3* 3.3* 3.6 3.9  CL 98* 102 106 108 108  CO2 22 24 21* 21* 22  GLUCOSE 160* 115* 103* 101* 119*  BUN 43* 48* 47* 46* 37*  CREATININE 2.22* 2.13* 2.27* 1.89* 1.60*  CALCIUM 9.0 7.7* 7.0* 7.1* 7.1*   Liver Function Tests:  Recent Labs Lab 06/06/15 0937  AST 45*  ALT 36  ALKPHOS 44  BILITOT 2.6*  PROT 6.9  ALBUMIN 3.4*    Recent Labs Lab 06/06/15 0937  LIPASE 15   No results for input(s): AMMONIA in the last 168 hours. CBC:  Recent Labs Lab 06/06/15 0937 06/07/15 0503 06/08/15 0509 06/09/15 0524 06/10/15 0359  WBC 15.7* 9.8 7.8 7.6 6.1  HGB  13.5 11.5* 10.6* 10.7* 10.3*  HCT 40.0 32.6* 30.3* 31.3* 29.4*  MCV 95.0 94.8 94.7 94.8 94.2  PLT 144* 103* 79* 97* 109*   Cardiac Enzymes: No results for input(s): CKTOTAL, CKMB, CKMBINDEX, TROPONINI in the last 168 hours. BNP: BNP (last 3 results) No results for input(s): BNP in the last 8760 hours.  ProBNP (last 3 results) No results for input(s): PROBNP in the last 8760 hours.  CBG: No results for input(s): GLUCAP in the last 168 hours.     Signed:  Birdie Hopes MD.  [facility] 06/10/2015, 9:55 AM

## 2015-06-10 NOTE — Care Management Important Message (Signed)
Important Message  Patient Details  Name: KONSTANTY SEIDNER MRN: [id number] Date of Birth: 12/10/1940   Medicare Important Message Given:  Yes    Camillo Flaming 06/10/2015, 10:19 AMImportant Message  Patient Details  Name: GODSON WAITES MRN: [id number] Date of Birth: 1940-09-12   Medicare Important Message Given:  Yes    Camillo Flaming 06/10/2015, 10:19 AM

## 2015-06-23 ENCOUNTER — Ambulatory Visit (HOSPITAL_COMMUNITY): Payer: Medicare Other

## 2015-07-05 ENCOUNTER — Emergency Department (HOSPITAL_COMMUNITY)
Admission: EM | Admit: 2015-07-05 | Discharge: 2015-07-06 | Disposition: A | Discharge status: Home / Self Care | Payer: Medicare Other | Attending: Emergency Medicine | Admitting: Emergency Medicine

## 2015-07-05 DIAGNOSIS — Z8546 Personal history of malignant neoplasm of prostate: Secondary | ICD-10-CM | POA: Diagnosis not present

## 2015-07-05 DIAGNOSIS — R339 Retention of urine, unspecified: Secondary | ICD-10-CM | POA: Insufficient documentation

## 2015-07-05 DIAGNOSIS — M1990 Unspecified osteoarthritis, unspecified site: Secondary | ICD-10-CM | POA: Diagnosis not present

## 2015-07-05 DIAGNOSIS — I700 Atherosclerosis of aorta: Secondary | ICD-10-CM | POA: Diagnosis not present

## 2015-07-05 DIAGNOSIS — Z125 Encounter for screening for malignant neoplasm of prostate: Secondary | ICD-10-CM | POA: Diagnosis not present

## 2015-07-05 DIAGNOSIS — I10 Essential (primary) hypertension: Secondary | ICD-10-CM | POA: Insufficient documentation

## 2015-07-05 DIAGNOSIS — I739 Peripheral vascular disease, unspecified: Secondary | ICD-10-CM | POA: Insufficient documentation

## 2015-07-05 DIAGNOSIS — Z7982 Long term (current) use of aspirin: Secondary | ICD-10-CM | POA: Insufficient documentation

## 2015-07-05 DIAGNOSIS — R310 Gross hematuria: Secondary | ICD-10-CM | POA: Diagnosis not present

## 2015-07-05 DIAGNOSIS — E7800 Pure hypercholesterolemia, unspecified: Secondary | ICD-10-CM | POA: Insufficient documentation

## 2015-07-05 DIAGNOSIS — R338 Other retention of urine: Secondary | ICD-10-CM

## 2015-07-05 DIAGNOSIS — Z87891 Personal history of nicotine dependence: Secondary | ICD-10-CM | POA: Diagnosis not present

## 2015-07-05 MED ORDER — SODIUM CHLORIDE 0.9 % IV BOLUS (SEPSIS)
1000.0000 mL | Freq: Once | INTRAVENOUS | Status: AC
  Administered 2015-07-05: 1000 mL via INTRAVENOUS

## 2015-07-05 NOTE — ED Notes (Addendum)
Pt states that he had a stent put in on May 8th in his bladder and today he has had blood in his urine and and is passing clots. Feels like he cannot urinate and has 386ml in bladder. Alert and oriented.

## 2015-07-05 NOTE — ED Provider Notes (Signed)
CSN: [id number]     Arrival date & time 07/05/15  2253 History   By signing my name below, I, Terrance Branch, attest that this documentation has been prepared under the direction and in the presence of Shanon Rosser, MD. Electronically Signed: Randa Evens, ED Scribe. 07/05/2015. 11:44 PM.      Chief Complaint  Patient presents with  . Urinary Retention   The history is provided by the patient. No language interpreter was used.   HPI Comments: Lance Dillon is a 75 y.o. male who presents to the Emergency Department Who recently underwent a cystoscopy with ureteral stent placement for obstructive ureteral stone. He presents complaining of urinary retention onset this afternoon at about 1 PM. Pt reports associated gross hematuria and passage of clots. Patient arrived in moderate discomfort but he was able to urinate here in the ED which relieved his discomfort. His urine continues to be grossly bloody. He denies fever, chills, nausea and vomiting.  Past Medical History  Diagnosis Date  . Hypercholesterolemia   . Hypertension   . Macular degeneration   . Hx of radiation therapy 03/22/11 to 05/08/11    PSA recurrent carcinoma of prostate  . Heart murmur     No evidence of valvular lesion noted on Echo 01/2010  . Peripheral vascular disease [facility]) June 2013    Bilateral SFA stenosis: Status post right femoropopliteal bypass - Dr. Kellie Simmering  . Stones in the urinary tract   . Renal artery stenosis (HCC)     70% left  . Arthritis   . Macular degeneration of both eyes   . Blind right eye   . Colon polyps 2004, 07, 14  . Cataract   . Prostate cancer ([place]) 2014  . 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 Atherosclerosis of  aortic bifurcation and common iliac arteries [facility]) February 2017     Noted on abdominal/renal Dopplers   Past Surgical History  Procedure Laterality Date  . Prostatectomy retropubic radical  07/15/2002    with nerve sparing, Gleason 3+4=7  . Penile prosthesis implant    . Penile prosthesis  removal    . Prostate biopsy    . Hernia repair      right side,lft  . Femoral-popliteal bypass graft  07/19/2011    Procedure: BYPASS GRAFT FEMORAL-POPLITEAL ARTERY;  Surgeon: Mal Misty, MD;  Location: [facility] OR;  Service: Vascular;  Laterality: Right;  Right Femoral - Popliteal  Bypss with saphenous vein  . Intraoperative arteriogram  07/19/2011    Procedure: INTRA OPERATIVE ARTERIOGRAM;  Surgeon: Mal Misty, MD;  Location: [facility];  Service: Vascular;  Laterality: Right;  . Pr vein bypass graft,aorto-fem-pop  07/19/11    Right  Fem/Pop BPG  . Colonoscopy w/ biopsies      multiple   . Transthoracic echocardiogram  January    Normal EF and overall function  . Nm myoview ltd  June 2013    Normal EF, very small area of basal-mid inferior ischemia; LOW RISK  . Reclast  July 25, 2013  . Abdominal aortagram N/A 06/20/2011    Procedure: ABDOMINAL Maxcine Ham;  Surgeon: Serafina Mitchell, MD;  Location: [facility] CATH LAB;  Service: Cardiovascular;  Laterality: N/A;  . Renal  duplex  07/01/2012    Left renal artery 60-99% diameter reduction  . Cardiovascular stress test  07/06/2011    Evidence of mild ischemia in Basal Inferior and Mid Inferior regions, no significant wall motion abnormalities noted.  . Transthoracic echocardiogram  02/23/2010    EF [id number], lv systolic function normal  . Eye surgery Right Aug. 2016    Cataract  . Cystoscopy w/ ureteral stent placement Left 06/07/2015    Procedure: CYSTOSCOPY, RETROGRADE PYLEGRAM  WITH STENT PLACEMENT;  Surgeon: Bjorn Loser, MD;  Location: [facility] ORS;  Service: Urology;  Laterality: Left;   Family History  Problem Relation Age of Onset  . Prostate cancer Brother 75   . Heart disease Brother     Before age 56  . Hypertension Brother   . Heart attack Brother   . Colon cancer Neg Hx   . Aneurysm Father   . Heart disease Father   . Hypertension Father   . Heart disease Mother     Before age 42  . Hypertension Mother   . Heart attack Mother   . Heart disease Sister   . Hypertension Sister   . Heart attack Sister    Social History  Substance Use Topics  . Smoking status: Former Smoker -- 3.00 packs/day for 20 years    Types: Cigarettes    Quit date: 07/01/2002  . Smokeless tobacco: Never Used  . Alcohol Use: No    Review of Systems  Genitourinary: Positive for hematuria and difficulty urinating.   A complete 10 system review of systems was obtained and all systems are negative except as noted in the HPI and PMH.   Allergies  Avelox  Home Medications   Prior to Admission medications   Medication Sig Start Date End Date Taking? Authorizing Provider  acetaminophen (TYLENOL) 325 MG tablet Take 650 mg by mouth every 6 (six) hours as needed. For pain   Yes Historical Provider, MD  amLODipine (NORVASC) 10 MG tablet Take 10 mg by mouth at bedtime.    Yes Historical Provider, MD  aspirin 81 MG tablet Take 81 mg by mouth daily.   Yes Historical Provider, MD  Calcium Carbonate-Vitamin D (CALTRATE 600+D PO) Take 1 tablet by mouth 2 (two) times daily.    Yes Historical Provider, MD  chlorhexidine (PERIDEX) 0.12 % solution  07/05/15  Yes Historical Provider, MD  clindamycin (CLEOCIN) 300 MG capsule Take 1 capsule by mouth 3 (three) times daily. 06/30/15  Yes Historical Provider, MD  docusate sodium (COLACE) 100 MG capsule Take 100 mg by mouth at bedtime.   Yes Historical Provider, MD  fluticasone (FLONASE) 50 MCG/ACT nasal spray Place 2 sprays into both nostrils as needed (for nasal congestion).  09/02/12  Yes Historical Provider, MD  latanoprost (XALATAN) 0.005 % ophthalmic solution Place 1 drop into the left eye at bedtime.  09/18/13  Yes Historical  Provider, MD  losartan-hydrochlorothiazide (HYZAAR) 100-12.5 MG tablet Take 1 tablet by mouth daily. 06/15/15  Yes Historical Provider, MD  LUTEIN PO Take 1 tablet by mouth daily.   Yes Historical Provider, MD  metoprolol succinate (TOPROL-XL) 25 MG 24 hr tablet Take 1 tablet (25 mg total) by mouth daily. 02/19/15  Yes Lorretta Harp, MD  Omega-3 Fatty Acids (FISH OIL) 1200 MG CAPS Take 3,600 mg by mouth daily.   Yes Historical Provider, MD  rosuvastatin (CRESTOR) 20 MG tablet Take 0.5 tablets by mouth daily. 04/19/15  Yes Historical Provider, MD  saccharomyces boulardii (FLORASTOR) 250 MG capsule  Take 1 capsule (250 mg total) by mouth 2 (two) times daily. 06/10/15  Yes Verlee Monte, MD  cephALEXin (KEFLEX) 500 MG capsule Take 1 capsule (500 mg total) by mouth every 12 (twelve) hours. 07/06/15   Labrian Torregrossa, MD   BP 120/75 mmHg  Pulse 76  Temp(Src) 97.9 F (36.6 C) (Oral)  Resp 19  SpO2 96%   Physical Exam General: Well-developed, well-nourished male in no acute distress; appearance consistent with age of record HENT: normocephalic; atraumatic Eyes: pupils equal, round and reactive to light; extraocular muscles intact, lens implants noted  Neck: supple Heart: regular rate and rhythm Lungs: clear to auscultation bilaterally Abdomen: soft; nondistended; nontender; no masses or hepatosplenomegaly; bowel sounds present Extremities: No deformity; full range of motion; pulses normal Neurologic: Awake, alert and oriented; motor function intact in all extremities and symmetric; no facial droop Skin: Warm and dry Psychiatric: Normal mood and affect  ED Course  Procedures (including critical care time)   MDM   Nursing notes and vitals signs, including pulse oximetry, reviewed.  Summary of this visit's results, reviewed by myself:  Labs:  Results for orders placed or performed during the hospital encounter of 07/05/15 (from the past 24 hour(s))  Urinalysis, Routine w reflex microscopic- may  I&O cath if menses     Status: Abnormal   Collection Time: 07/06/15  1:14 AM  Result Value Ref Range   Color, Urine RED (A) YELLOW   APPearance CLOUDY (A) CLEAR   Specific Gravity, Urine 1.008 1.005 - 1.030   pH 6.0 5.0 - 8.0   Glucose, UA NEGATIVE NEGATIVE mg/dL   Hgb urine dipstick LARGE (A) NEGATIVE   Bilirubin Urine NEGATIVE NEGATIVE   Ketones, ur NEGATIVE NEGATIVE mg/dL   Protein, ur 100 (A) NEGATIVE mg/dL   Nitrite NEGATIVE NEGATIVE   Leukocytes, UA LARGE (A) NEGATIVE  Urine microscopic-add on     Status: Abnormal   Collection Time: 07/06/15  1:14 AM  Result Value Ref Range   Squamous Epithelial / LPF NONE SEEN NONE SEEN   WBC, UA TOO NUMEROUS TO COUNT 0 - 5 WBC/hpf   RBC / HPF TOO NUMEROUS TO COUNT 0 - 5 RBC/hpf   Bacteria, UA RARE (A) NONE SEEN   2:17 AM Patient given IV fluid bolus and has continued to pass bloody urine without obstruction. I do not feel the need to place a Foley at this time. He has an appointment with Dr. Alinda Money of urology this afternoon at 4 PM.  Final diagnoses:  Acute urinary retention  Clot hematuria   I personally performed the services described in this documentation, which was scribed in my presence. The recorded information has been reviewed and is accurate.      Shanon Rosser, MD 07/06/15 [phone]

## 2015-07-06 LAB — URINALYSIS, ROUTINE W REFLEX MICROSCOPIC
BILIRUBIN URINE: NEGATIVE
GLUCOSE, UA: NEGATIVE mg/dL
KETONES UR: NEGATIVE mg/dL
NITRITE: NEGATIVE
PH: 6 (ref 5.0–8.0)
Protein, ur: 100 mg/dL — AB
SPECIFIC GRAVITY, URINE: 1.008 (ref 1.005–1.030)

## 2015-07-06 LAB — URINE MICROSCOPIC-ADD ON: Squamous Epithelial / LPF: NONE SEEN

## 2015-07-06 MED ORDER — ACETAMINOPHEN 325 MG PO TABS
650.0000 mg | ORAL_TABLET | Freq: Once | ORAL | Status: AC
  Administered 2015-07-06: 650 mg via ORAL
  Filled 2015-07-06: qty 2

## 2015-07-06 MED ORDER — CEPHALEXIN 500 MG PO CAPS: 500.0000 mg | ORAL_CAPSULE | Freq: Two times a day (BID) | ORAL | Status: DC

## 2015-07-06 MED ORDER — CEPHALEXIN 500 MG PO CAPS
1000.0000 mg | ORAL_CAPSULE | Freq: Once | ORAL | Status: AC
  Administered 2015-07-06: 1000 mg via ORAL
  Filled 2015-07-06: qty 2

## 2015-07-06 NOTE — ED Notes (Signed)
Pt given dischare instructions,veralized understanding of need to follow up,reasons to return to the ED and medications to take at home. Pt denied further questions or concerns. Pt reports teeth pain from procedure yesterday morning. Pt otherwise denies further questions or concerns. Pt ambulatory without issue. VSS.

## 2015-07-06 NOTE — ED Notes (Signed)
MD at bedside. Foley to be discontinued. Pt was able to void in BR from triage to room 11.

## 2015-07-06 NOTE — ED Notes (Signed)
Pt returned to room for medication for dental pain. Pt then escorted to exit, ambulatory without difficulty.

## 2015-07-07 LAB — URINE CULTURE

## 2015-07-16 ENCOUNTER — Other Ambulatory Visit (HOSPITAL_COMMUNITY): Payer: Self-pay | Admitting: Interventional Radiology

## 2015-07-16 DIAGNOSIS — I671 Cerebral aneurysm, nonruptured: Secondary | ICD-10-CM

## 2015-07-22 ENCOUNTER — Ambulatory Visit (HOSPITAL_COMMUNITY)
Admission: RE | Admit: 2015-07-22 | Discharge: 2015-07-22 | Disposition: A | Discharge status: Home / Self Care | Payer: Medicare Other | Source: Ambulatory Visit | Attending: Interventional Radiology | Admitting: Interventional Radiology

## 2015-07-22 DIAGNOSIS — I671 Cerebral aneurysm, nonruptured: Secondary | ICD-10-CM

## 2015-07-27 ENCOUNTER — Encounter (HOSPITAL_COMMUNITY): Payer: Self-pay | Admitting: *Deleted

## 2015-07-27 ENCOUNTER — Other Ambulatory Visit: Payer: Self-pay | Admitting: Urology

## 2015-07-28 ENCOUNTER — Ambulatory Visit (HOSPITAL_COMMUNITY): Payer: Medicare Other | Admitting: Certified Registered Nurse Anesthetist

## 2015-07-28 ENCOUNTER — Ambulatory Visit (HOSPITAL_COMMUNITY)
Admission: RE | Admit: 2015-07-28 | Discharge: 2015-07-28 | Disposition: A | Discharge status: Home / Self Care | Payer: Medicare Other | Source: Ambulatory Visit | Attending: Urology | Admitting: Urology

## 2015-07-28 ENCOUNTER — Encounter (HOSPITAL_COMMUNITY): Payer: Self-pay | Admitting: *Deleted

## 2015-07-28 ENCOUNTER — Encounter (HOSPITAL_COMMUNITY)
Admission: RE | Disposition: A | Discharge status: Home / Self Care | Payer: Self-pay | Source: Ambulatory Visit | Attending: Urology

## 2015-07-28 ENCOUNTER — Ambulatory Visit (HOSPITAL_COMMUNITY): Payer: Medicare Other

## 2015-07-28 DIAGNOSIS — N201 Calculus of ureter: Secondary | ICD-10-CM | POA: Diagnosis not present

## 2015-07-28 DIAGNOSIS — I6529 Occlusion and stenosis of unspecified carotid artery: Secondary | ICD-10-CM | POA: Diagnosis not present

## 2015-07-28 DIAGNOSIS — Z9079 Acquired absence of other genital organ(s): Secondary | ICD-10-CM | POA: Diagnosis not present

## 2015-07-28 DIAGNOSIS — Z7982 Long term (current) use of aspirin: Secondary | ICD-10-CM | POA: Insufficient documentation

## 2015-07-28 DIAGNOSIS — Z923 Personal history of irradiation: Secondary | ICD-10-CM | POA: Diagnosis not present

## 2015-07-28 DIAGNOSIS — N393 Stress incontinence (female) (male): Secondary | ICD-10-CM | POA: Diagnosis not present

## 2015-07-28 DIAGNOSIS — Z87891 Personal history of nicotine dependence: Secondary | ICD-10-CM | POA: Diagnosis not present

## 2015-07-28 DIAGNOSIS — C61 Malignant neoplasm of prostate: Secondary | ICD-10-CM | POA: Insufficient documentation

## 2015-07-28 DIAGNOSIS — N529 Male erectile dysfunction, unspecified: Secondary | ICD-10-CM | POA: Insufficient documentation

## 2015-07-28 DIAGNOSIS — E7800 Pure hypercholesterolemia, unspecified: Secondary | ICD-10-CM | POA: Diagnosis not present

## 2015-07-28 DIAGNOSIS — H409 Unspecified glaucoma: Secondary | ICD-10-CM | POA: Insufficient documentation

## 2015-07-28 DIAGNOSIS — I739 Peripheral vascular disease, unspecified: Secondary | ICD-10-CM | POA: Diagnosis not present

## 2015-07-28 DIAGNOSIS — I10 Essential (primary) hypertension: Secondary | ICD-10-CM | POA: Insufficient documentation

## 2015-07-28 DIAGNOSIS — R9389 Abnormal findings on diagnostic imaging of other specified body structures: Secondary | ICD-10-CM

## 2015-07-28 DIAGNOSIS — Z79899 Other long term (current) drug therapy: Secondary | ICD-10-CM | POA: Diagnosis not present

## 2015-07-28 HISTORY — DX: Cerebral infarction, unspecified: I63.9

## 2015-07-28 HISTORY — PX: CYSTOSCOPY WITH RETROGRADE PYELOGRAM, URETEROSCOPY AND STENT PLACEMENT: SHX5789

## 2015-07-28 HISTORY — DX: Unspecified glaucoma: H40.9

## 2015-07-28 HISTORY — DX: Unspecified urinary incontinence: R32

## 2015-07-28 LAB — BASIC METABOLIC PANEL
Anion gap: 8 (ref 5–15)
BUN: 23 mg/dL — AB (ref 6–20)
CHLORIDE: 103 mmol/L (ref 101–111)
CO2: 25 mmol/L (ref 22–32)
Calcium: 8.8 mg/dL — ABNORMAL LOW (ref 8.9–10.3)
Creatinine, Ser: 1.17 mg/dL (ref 0.61–1.24)
GFR calc Af Amer: >60 mL/min (ref >60)
GFR calc non Af Amer: 60 mL/min — ABNORMAL LOW (ref >60)
GLUCOSE: 90 mg/dL (ref 65–99)
POTASSIUM: 3.6 mmol/L (ref 3.5–5.1)
Sodium: 136 mmol/L (ref 135–145)

## 2015-07-28 LAB — CBC
HEMATOCRIT: 31.9 % — AB (ref 39.0–52.0)
Hemoglobin: 11 g/dL — ABNORMAL LOW (ref 13.0–17.0)
MCH: 32.3 pg (ref 26.0–34.0)
MCHC: 34.5 g/dL (ref 30.0–36.0)
MCV: 93.5 fL (ref 78.0–100.0)
Platelets: 152 10*3/uL (ref 150–400)
RBC: 3.41 MIL/uL — ABNORMAL LOW (ref 4.22–5.81)
RDW: 13.7 % (ref 11.5–15.5)
WBC: 5.4 10*3/uL (ref 4.0–10.5)

## 2015-07-28 LAB — SURGICAL PCR SCREEN
MRSA, PCR: NEGATIVE
Staphylococcus aureus: NEGATIVE

## 2015-07-28 SURGERY — CYSTOURETEROSCOPY, WITH RETROGRADE PYELOGRAM AND STENT INSERTION
Anesthesia: General | Laterality: Left

## 2015-07-28 MED ORDER — PROPOFOL 10 MG/ML IV BOLUS
INTRAVENOUS | Status: DC | PRN
  Administered 2015-07-28: 130 mg via INTRAVENOUS

## 2015-07-28 MED ORDER — FENTANYL CITRATE (PF) 100 MCG/2ML IJ SOLN
INTRAMUSCULAR | Status: DC | PRN
  Administered 2015-07-28 (×2): 50 ug via INTRAVENOUS

## 2015-07-28 MED ORDER — ONDANSETRON HCL 4 MG/2ML IJ SOLN
INTRAMUSCULAR | Status: DC | PRN
  Administered 2015-07-28: 4 mg via INTRAVENOUS

## 2015-07-28 MED ORDER — LACTATED RINGERS IV SOLN
INTRAVENOUS | Status: DC | PRN
  Administered 2015-07-28: 19:00:00 via INTRAVENOUS
  Administered 2015-07-28: 1000 mL

## 2015-07-28 MED ORDER — FENTANYL CITRATE (PF) 100 MCG/2ML IJ SOLN
INTRAMUSCULAR | Status: AC
  Filled 2015-07-28: qty 2

## 2015-07-28 MED ORDER — PHENYLEPHRINE 40 MCG/ML (10ML) SYRINGE FOR IV PUSH (FOR BLOOD PRESSURE SUPPORT)
PREFILLED_SYRINGE | INTRAVENOUS | Status: DC | PRN
  Administered 2015-07-28 (×3): 80 ug via INTRAVENOUS

## 2015-07-28 MED ORDER — HYDROCODONE-ACETAMINOPHEN 5-325 MG PO TABS: 1.0000 | ORAL_TABLET | Freq: Four times a day (QID) | ORAL | Status: DC | PRN

## 2015-07-28 MED ORDER — SODIUM CHLORIDE 0.9 % IV SOLN
INTRAVENOUS | Status: DC | PRN
  Administered 2015-07-28: 19:00:00

## 2015-07-28 MED ORDER — LIDOCAINE HCL (CARDIAC) 20 MG/ML IV SOLN
INTRAVENOUS | Status: AC
  Filled 2015-07-28: qty 15

## 2015-07-28 MED ORDER — GLYCOPYRROLATE 0.2 MG/ML IV SOSY
PREFILLED_SYRINGE | INTRAVENOUS | Status: DC | PRN
Start: 2015-07-28 — End: 2015-07-28
  Administered 2015-07-28: .2 mg via INTRAVENOUS

## 2015-07-28 MED ORDER — LIDOCAINE HCL (CARDIAC) 20 MG/ML IV SOLN
INTRAVENOUS | Status: DC | PRN
  Administered 2015-07-28: 50 mg via INTRAVENOUS

## 2015-07-28 MED ORDER — CEFAZOLIN SODIUM-DEXTROSE 2-4 GM/100ML-% IV SOLN
2.0000 g | INTRAVENOUS | Status: AC
  Administered 2015-07-28: 2 g via INTRAVENOUS
  Filled 2015-07-28: qty 100

## 2015-07-28 MED ORDER — CEFAZOLIN SODIUM-DEXTROSE 2-4 GM/100ML-% IV SOLN
INTRAVENOUS | Status: AC
  Filled 2015-07-28: qty 100

## 2015-07-28 MED ORDER — PROPOFOL 10 MG/ML IV BOLUS
INTRAVENOUS | Status: AC
  Filled 2015-07-28: qty 20

## 2015-07-28 MED ORDER — SODIUM CHLORIDE 0.9 % IR SOLN
Status: DC | PRN
  Administered 2015-07-28: 4000 mL

## 2015-07-28 SURGICAL SUPPLY — 17 items
BAG URO CATCHER STRL LF (MISCELLANEOUS) ×2 IMPLANT
BASKET ZERO TIP NITINOL 2.4FR (BASKET) IMPLANT
CATH INTERMIT  6FR 70CM (CATHETERS) ×2 IMPLANT
CLOTH BEACON ORANGE TIMEOUT ST (SAFETY) ×2 IMPLANT
EXTRACTOR STONE NITINOL NGAGE (UROLOGICAL SUPPLIES) ×2 IMPLANT
FIBER LASER FLEXIVA 365 (UROLOGICAL SUPPLIES) IMPLANT
FIBER LASER TRAC TIP (UROLOGICAL SUPPLIES) IMPLANT
GLOVE BIOGEL M STRL SZ7.5 (GLOVE) ×10 IMPLANT
GOWN STRL REUS W/TWL LRG LVL3 (GOWN DISPOSABLE) ×6 IMPLANT
GUIDEWIRE ANG ZIPWIRE 038X150 (WIRE) IMPLANT
GUIDEWIRE STR DUAL SENSOR (WIRE) ×2 IMPLANT
IV NS 1000ML (IV SOLUTION)
IV NS 1000ML BAXH (IV SOLUTION) IMPLANT
MANIFOLD NEPTUNE II (INSTRUMENTS) ×2 IMPLANT
PACK CYSTO (CUSTOM PROCEDURE TRAY) ×2 IMPLANT
SHEATH ACCESS URETERAL 38CM (SHEATH) ×2 IMPLANT
TUBING CONNECTING 10 (TUBING) ×2 IMPLANT

## 2015-07-28 NOTE — Discharge Instructions (Addendum)
1. You may see some blood in the urine and may have some burning with urination for 48-72 hours. You also may notice that you have to urinate more frequently or urgently after your procedure which is normal.  °2. You should call should you develop an inability urinate, fever > 101, persistent nausea and vomiting that prevents you from eating or drinking to stay hydrated.  °

## 2015-07-28 NOTE — Op Note (Signed)
Preoperative diagnosis: Left ureteral calculus  Postoperative diagnosis: Left ureteral calculus  Procedure:  1. Cystoscopy 2. Left ureteroscopy and stone removal  Surgeon: Pryor Curia. M.D.  Anesthesia: General  Complications: None  EBL: Minimal  Specimens: 1. Left ureteral calculus  Disposition of specimens: [facility] for stone analysis  Indication: Lance Dillon  is a 75 y.o. patient with urolithiasis. He is s/p left ureteral stent after presenting in May with an obstructing left ureteral stone and fever.  He is now here today for definitive treatment. After reviewing the management options for treatment, they elected to proceed with the above surgical procedure(s). We have discussed the potential benefits and risks of the procedure, side effects of the proposed treatment, the likelihood of the patient achieving the goals of the procedure, and any potential problems that might occur during the procedure or recuperation. Informed consent has been obtained.  Description of procedure:  The patient was taken to the operating room and general anesthesia was induced.  The patient was placed in the dorsal lithotomy position, prepped and draped in the usual sterile fashion, and preoperative antibiotics were administered. A preoperative time-out was performed.   Cystourethroscopy was performed.  The patient's urethra was examined and was noted to be rigid posteriorly although the cystoscope could pass into the bladder without dilation. The bladder was then systematically examined in its entirety. There was no evidence for any bladder tumors, stones, or other mucosal pathology.    Attention then turned to the left ureteral orifice and the ureteral stent was identified and grasped and brought to the urethral meatus.    A 0.38 sensor guidewire was then advanced up the left ureter into the renal pelvis under fluoroscopic guidance.  An attempt at semirigid  ureteroscopy was made but I could not reach the stone due to the rigidity of the urethra.  I then placed a 12/14 Fr ureteral access sheath was then advanced over the guide wire into the mid ureter below the level of the stone. The digital flexible ureteroscope was then advanced through the access sheath into the ureter next to the guidewire and the calculus was identified and was located in the mid ureter.  The stone was grasped with an N gauge basket and was able to be removed intact.  The access sheath was removed.  Considering the patient had an indwelling stent for an extended time with minimal edema and instrumentation, it was decided to leave him without a stent.  The bladder was then emptied and the procedure ended.  The patient appeared to tolerate the procedure well and without complications.  The patient was able to be awakened and transferred to the recovery unit in satisfactory condition.   Pryor Curia MD

## 2015-07-28 NOTE — Anesthesia Postprocedure Evaluation (Signed)
Anesthesia Post Note  Patient: Lance Dillon  Procedure(s) Performed: Procedure(s) (LRB): CYSTOSCOPY, URETEROSCOPY, WITH BASKET STONE REMOVAL (Left)  Patient location during evaluation: PACU Anesthesia Type: General Level of consciousness: awake and alert Pain management: pain level controlled Vital Signs Assessment: post-procedure vital signs reviewed and stable Respiratory status: spontaneous breathing, nonlabored ventilation, respiratory function stable and patient connected to nasal cannula oxygen Cardiovascular status: blood pressure returned to baseline and stable Postop Assessment: no signs of nausea or vomiting Anesthetic complications: no    Last Vitals:  Filed Vitals:   07/28/15 2000 07/28/15 2005  BP:  132/78  Pulse:    Temp: 36.9 C 36.9 C  Resp:      Last Pain:  Filed Vitals:   07/28/15 2054  PainSc: 0-No pain                 Jaxten Brosh L

## 2015-07-28 NOTE — Transfer of Care (Signed)
Immediate Anesthesia Transfer of Care Note  Patient: Lance Dillon  Procedure(s) Performed: Procedure(s): CYSTOSCOPY, URETEROSCOPY, WITH BASKET STONE REMOVAL (Left)  Patient Location: PACU  Anesthesia Type:General  Level of Consciousness:  sedated, patient cooperative and responds to stimulation  Airway & Oxygen Therapy:Patient Spontanous Breathing and Patient connected to face mask oxgen  Post-op Assessment:  Report given to PACU RN and Post -op Vital signs reviewed and stable  Post vital signs:  Reviewed and stable  Last Vitals:  Filed Vitals:   07/28/15 1424  BP: 124/72  Pulse: 67  Temp: 36.6 C  Resp: 16    Complications: No apparent anesthesia complications

## 2015-07-28 NOTE — Interval H&P Note (Signed)
History and Physical Interval Note:  07/28/2015 6:29 PM  [name]  has presented today for surgery, with the diagnosis of LEFT URETERAL STONE  The various methods of treatment have been discussed with the patient and family. After consideration of risks, benefits and other options for treatment, the patient has consented to  Procedure(s): CYSTOSCOPY WITH RETROGRADE PYELOGRAM, URETEROSCOPY AND STENT PLACEMENT (Left) HOLMIUM LASER APPLICATION (Left) as a surgical intervention .  The patient's history has been reviewed, patient examined, no change in status, stable for surgery.  I have reviewed the patient's chart and labs.  Questions were answered to the patient's satisfaction.    I spoke with Dr. Estil Daft who assured me that his aneurysm was not at risk for rupture and that he could safely proceed with ureteroscopic treatment of his stone.  He also recommended that his stone be dealt with prior to treatment of his aneurysm.   Misako Roeder,LES

## 2015-07-28 NOTE — H&P (Signed)
Office Visit Report     07/06/2015   --------------------------------------------------------------------------------   Lance Dillon  MRN: [id number]  PRIMARY CARE:  Lance Sheller, MD  DOB: September 10, 1940, 75 year old Male  REFERRING:    SSN: [id number]  PROVIDER:  Raynelle Dillon, M.D.    LOCATION:  [facility] [phone]   --------------------------------------------------------------------------------   CC: I have ureteral stone.  HPI: Lance Dillon is a 75 year-old male established patient who is here for ureteral stone.  Lance Dillon follows up today for further evaluation of his left ureteral stone. He was admitted during his hospitalization a few weeks ago and underwent left ureteral stent placement out of concern of possible infection with an obstructing stone. He subsequently improved with antibiotic therapy although did persist in having fevers and eventually was found to have a possible infected tooth requiring extraction. He remains on clindamycin. Since his tooth extraction, he has not had any further fevers. 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 He was originally scheduled to have this performed on 06/23/15 on the care of Lance Dillon.   He has been tolerating his stent well although developed difficulty voiding last evening and presented to the emergency department. He apparently had concern for clot urinary retention although was able to pass a large clot and did not require catheter placement. He was placed on cephalexin although review of his urinalysis does not really significant concern for infection.   He has left ureteral obstruction. He first stated noticing a decline in his erectile function approximately 05/31/2015. He is not currently having flank pain, back pain, groin pain, nausea, vomiting, fever or chills.     ALLERGIES: Avelox TABS    MEDICATIONS: Crestor 20 mg  tablet  Amlodipine Besylate 10 mg tablet  Aspirin Ec 81 mg tablet, delayed release  Caltrate 600 + D  Ibuprofen 200 mg tablet  Icaps  Latanoprost 0.005 % drops  Losartan-Hydrochlorothiazide 100 mg-12.5 mg tablet  Omega 3  Reclast 5 mg/100 ml infusion bottle  Toprol Xl 25 mg tablet, extended release 24 hr  Tylenol 325 mg tablet  Vitamin D     GU PSH: Extensive Prostate Surgery - 2008      PSH Notes: Surgery Prostatectomy Retropubic Radical With Nerve Sparing   NON-GU PSH: None   GU PMH: Stress Incontinence, M/F, Male stress incontinence - 03/18/2015 Gross hematuria, Gross hematuria - 02/02/2015 Prostate Cancer, Prostate cancer - 02/02/2015 Other microscopic hematuria, Microscopic hematuria - 2014 Personal history of malignant neoplasm of prostate, Prostate Cancer - 2014      PMH Notes:  1) Biochemically recurrent prostate cancer: He is s/p a radical retropubic prostatectomy by Lance Dillon on 07/15/02. His PSA has been barely detectable on ultrasensitive testing intermittently since surgery. He was initially observed but finally met the definition for biochemical recurrence in January 2013. He was treated with 66 Gy of IMRT by Lance Dillon completed in April 2013. 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   TNM stage: cT1c N0 Mx  Gleason score: 3+4=7  Surgical margins: Positive (focal-apex)   2) Incontinence: He has had stress incontinence since his prostatectomy. 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 He did have an artificial sphincter placed for treatment of his incontinence which required removal due to an MRSA infection. He has since seen Lance Dillon and has not  wished to pursue further treatment of his incontinence thus far. I have discussed his situation with Lance Dillon who feels that an AUS would be his best option for  treatment but would come with about a 15% risk of recurrent infection. He currently manages his incontinence with a Cunningham clamp.   3) Erectile dysfunction: He had a penile prosthesis placed after his prostatectomy which required removal due to an MRSA infection.   4) Hematuria: This was detected incidentally in April 2011 and has not been persistent or recurrent. He denies gross hematuria and has no family history of urothelial cancer. He did smoke 1 1/2 PPD for 20-30 years but quit in 2004. He developed painless gross hematuria in April 2016 that was felt to be related to infection, radiation cystitis, or non-obstructing renal stones.   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  Has never drank.      Notes: Former smoker, Alcohol Use, Occupation:, Tobacco Use   REVIEW OF SYSTEMS:    GU Review Male:   Patient denies frequent urination, hard to postpone urination, burning/ pain with urination, get up at night to urinate, leakage of urine, stream starts and stops, trouble starting your streams, and have to strain to urinate .  Gastrointestinal (Lower):   Patient denies diarrhea and constipation.  Gastrointestinal (Upper):   Patient denies nausea and vomiting.  Constitutional:   Patient reports weight loss. Patient denies fever, night sweats, and fatigue.  Skin:   Patient denies skin rash/ lesion and itching.  Eyes:    Patient denies blurred vision and double vision.  Ears/ Nose/ Throat:   Patient denies sore throat and sinus problems.  Hematologic/Lymphatic:   Patient denies swollen glands and easy bruising.  Cardiovascular:   Patient denies leg swelling and chest pains.  Respiratory:   Patient denies cough and shortness of breath.  Endocrine:   Patient denies excessive thirst.  Musculoskeletal:   Patient denies back pain and joint pain.  Neurological:   Patient denies headaches and dizziness.  Psychologic:   Patient denies depression and anxiety.   VITAL SIGNS:    Weight: 153 lb/69.4 kg   Height/Length: 68 in / 173 cm   BP: 113/67 mmHg   Heart Rate: 92 /min   BMI: 23.3      MULTI-SYSTEM PHYSICAL EXAMINATION:    Constitutional: Well-nourished. No physical deformities. Normally developed. Good grooming.  Neck: Neck symmetrical, not swollen. Normal tracheal position.  Respiratory: No labored breathing, no use of accessory muscles.   Cardiovascular: Normal temperature, normal extremity pulses, no swelling, no varicosities.  Lymphatic: No enlargement of neck, axillae, groin.  Skin: No paleness, no jaundice, no cyanosis. No lesion, no ulcer, no rash.  Neurologic / Psychiatric: Oriented to time, oriented to place, oriented to person. No depression, no anxiety, no agitation.  Eyes: Normal conjunctivae. Normal eyelids.  Ears, Nose, Mouth, and Throat: Left ear no scars, no lesions, no masses. Right ear no scars, no lesions, no masses. Nose no  scars, no lesions, no masses. Normal hearing. Normal lips.  Musculoskeletal: Normal gait and station of head and neck.   PAST DATA REVIEWED:  Source Of History:  Patient  Records Review:   Previous Hospital Records  X-Ray Review: CT abdomen/pelvis: Reviewed Films.     PROCEDURES:          Urinalysis w/Scope - 81003, 81015 Dipstick Dipstick Cont'd Micro  Specimen: Voided Blood: 3+ WBC/hpf: 50-100  Appearance: Cloudy pH: 5.5 RBC/hpf: 15-20  Color: Yellow  Protein: Trace Epithelial Cells: Neg  Glucose: Neg Nitrites: Neg Bacteria: Neg  Bilirubin: Neg Leukocyte Esterase: 2+ Fine Grain Casts/lpf: Neg  Ketones: Neg   Coarse Grain Casts/lpf: Neg  Specific Gravity: 1.015   Hyaline Casts/lpf: Neg      Mucous: Neg      Yeast: Neg      Trichomonas: Neg    ASSESSMENT:      ICD-10 Details  1 GU:   Calculus Ureter - N20.1    PLAN:           Orders Labs Urine Culture and Sensitivity          Schedule Return Visit: Keep Scheduled Appointment  Return Notes: Will call to schedule surgery          Document Letter(s):  Created for Patient: Clinical Summary         Notes:   1. Left ureteral calculus status post stent placement: I recommended that he proceed with treatment of his aneurysm prior to undergoing elective surgery for treatment of his ureteral stone. He has wife of apparently had a difficult time getting in touch with Lance Dillon to reschedule this procedure. I put in a call today to their office. Following treatment of his aneurysm, he will be scheduled for cystoscopy, left ureteroscopy with stone removal. We discussed the risk and benefits of surgery including the potential complications and the expected recovery process. They expressed their understanding. Informed consent was obtained.   2. Biochemically recurrent prostate cancer: He is scheduled for a follow-up appointment on July 26.   3. Stress incontinence: He has met with Lance Dillon and it was decided not to proceed with further surgical therapy.    * Signed by Raynelle Dillon, M.D. on 07/06/15 at 7:04 PM (EDT)*

## 2015-07-28 NOTE — Anesthesia Preprocedure Evaluation (Addendum)
Anesthesia Evaluation  Patient identified by MRN, date of birth, ID band Patient awake    Reviewed: Allergy & Precautions, H&P , NPO status , Patient's Chart, lab work & pertinent test results, reviewed documented beta blocker date and time   Airway Mallampati: II  TM Distance: >3 FB Neck ROM: Full    Dental  (+) Dental Advisory Given, Missing Most upper and lower front teeth are missing:   Pulmonary neg pulmonary ROS, former smoker,    Pulmonary exam normal breath sounds clear to auscultation       Cardiovascular hypertension, Pt. on medications and Pt. on home beta blockers + Peripheral Vascular Disease  Normal cardiovascular exam Rhythm:Regular Rate:Normal     Neuro/Psych Cerebral aneurysm. Vertigo. Glaucoma. Internal carotid stenosis CVA negative psych ROS   GI/Hepatic negative GI ROS, Neg liver ROS,   Endo/Other  negative endocrine ROS  Renal/GU negative Renal ROS  negative genitourinary   Musculoskeletal negative musculoskeletal ROS (+)   Abdominal   Peds negative pediatric ROS (+)  Hematology negative hematology ROS (+)   Anesthesia Other Findings   Reproductive/Obstetrics negative OB ROS                            Anesthesia Physical Anesthesia Plan  ASA: III  Anesthesia Plan: General   Post-op Pain Management:    Induction: Intravenous  Airway Management Planned: LMA  Additional Equipment:   Intra-op Plan:   Post-operative Plan:   Informed Consent: I have reviewed the patients History and Physical, chart, labs and discussed the procedure including the risks, benefits and alternatives for the proposed anesthesia with the patient or authorized representative who has indicated his/her understanding and acceptance.   Dental Advisory Given  Plan Discussed with: CRNA  Anesthesia Plan Comments:         Anesthesia Quick Evaluation

## 2015-07-29 ENCOUNTER — Encounter (HOSPITAL_COMMUNITY): Payer: Self-pay | Admitting: Urology

## 2015-08-11 DIAGNOSIS — I671 Cerebral aneurysm, nonruptured: Secondary | ICD-10-CM | POA: Insufficient documentation

## 2015-08-11 DIAGNOSIS — I6521 Occlusion and stenosis of right carotid artery: Secondary | ICD-10-CM | POA: Insufficient documentation

## 2015-10-05 ENCOUNTER — Encounter (HOSPITAL_COMMUNITY): Payer: Medicare Other

## 2015-10-05 ENCOUNTER — Ambulatory Visit: Payer: Medicare Other | Admitting: Family

## 2015-11-05 ENCOUNTER — Encounter: Payer: Self-pay | Admitting: Family

## 2015-11-09 ENCOUNTER — Ambulatory Visit (INDEPENDENT_AMBULATORY_CARE_PROVIDER_SITE_OTHER): Payer: Medicare Other | Admitting: Family

## 2015-11-09 ENCOUNTER — Ambulatory Visit (HOSPITAL_COMMUNITY)
Admission: RE | Admit: 2015-11-09 | Discharge: 2015-11-09 | Disposition: A | Discharge status: Home / Self Care | Payer: Medicare Other | Source: Ambulatory Visit | Attending: Vascular Surgery | Admitting: Vascular Surgery

## 2015-11-09 ENCOUNTER — Encounter: Payer: Self-pay | Admitting: Family

## 2015-11-09 ENCOUNTER — Ambulatory Visit (INDEPENDENT_AMBULATORY_CARE_PROVIDER_SITE_OTHER)
Admission: RE | Admit: 2015-11-09 | Discharge: 2015-11-09 | Disposition: A | Discharge status: Home / Self Care | Payer: Medicare Other | Source: Ambulatory Visit | Attending: Vascular Surgery | Admitting: Vascular Surgery

## 2015-11-09 ENCOUNTER — Telehealth (HOSPITAL_COMMUNITY): Payer: Self-pay | Admitting: Radiology

## 2015-11-09 VITALS — BP 126/81 | HR 69 | Temp 97.0°F | Resp 16 | Ht 68.0 in | Wt 163.0 lb

## 2015-11-09 DIAGNOSIS — I70219 Atherosclerosis of native arteries of extremities with intermittent claudication, unspecified extremity: Secondary | ICD-10-CM

## 2015-11-09 DIAGNOSIS — Z95828 Presence of other vascular implants and grafts: Secondary | ICD-10-CM | POA: Diagnosis not present

## 2015-11-09 DIAGNOSIS — I6523 Occlusion and stenosis of bilateral carotid arteries: Secondary | ICD-10-CM

## 2015-11-09 DIAGNOSIS — Z87891 Personal history of nicotine dependence: Secondary | ICD-10-CM | POA: Diagnosis not present

## 2015-11-09 DIAGNOSIS — I6521 Occlusion and stenosis of right carotid artery: Secondary | ICD-10-CM | POA: Diagnosis not present

## 2015-11-09 NOTE — Patient Instructions (Signed)
Peripheral Vascular Disease Peripheral vascular disease (PVD) is a disease of the blood vessels that are not part of your heart and brain. A simple term for PVD is poor circulation. In most cases, PVD narrows the blood vessels that carry blood from your heart to the rest of your body. This can result in a decreased supply of blood to your arms, legs, and internal organs, like your stomach or kidneys. However, it most often affects a person's lower legs and feet. There are two types of PVD.  Organic PVD. This is the more common type. It is caused by damage to the structure of blood vessels.  Functional PVD. This is caused by conditions that make blood vessels contract and tighten (spasm). Without treatment, PVD tends to get worse over time. PVD can also lead to acute ischemic limb. This is when an arm or limb suddenly has trouble getting enough blood. This is a medical emergency. CAUSES Each type of PVD has many different causes. The most common cause of PVD is buildup of a fatty material (plaque) inside of your arteries (atherosclerosis). Small amounts of plaque can break off from the walls of the blood vessels and become lodged in a smaller artery. This blocks blood flow and can cause acute ischemic limb. Other common causes of PVD include:  Blood clots that form inside of blood vessels.  Injuries to blood vessels.  Diseases that cause inflammation of blood vessels or cause blood vessel spasms.  Health behaviors and health history that increase your risk of developing PVD. RISK FACTORS  You may have a greater risk of PVD if you:  Have a family history of PVD.  Have certain medical conditions, including:  High cholesterol.  Diabetes.  High blood pressure (hypertension).  Coronary heart disease.  Past problems with blood clots.  Past injury, such as burns or a broken bone. These may have damaged blood vessels in your limbs.  Buerger disease. This is caused by inflamed blood  vessels in your hands and feet.  Some forms of arthritis.  Rare birth defects that affect the arteries in your legs.  Use tobacco.  Do not get enough exercise.  Are obese.  Are age 50 or older. SIGNS AND SYMPTOMS  PVD may cause many different symptoms. Your symptoms depend on what part of your body is not getting enough blood. Some common signs and symptoms include:  Cramps in your lower legs. This may be a symptom of poor leg circulation (claudication).  Pain and weakness in your legs while you are physically active that goes away when you rest (intermittent claudication).  Leg pain when at rest.  Leg numbness, tingling, or weakness.  Coldness in a leg or foot, especially when compared with the other leg.  Skin or hair changes. These can include:  Hair loss.  Shiny skin.  Pale or bluish skin.  Thick toenails.  Inability to get or maintain an erection (erectile dysfunction). People with PVD are more prone to developing ulcers and sores on their toes, feet, or legs. These may take longer than normal to heal. DIAGNOSIS Your health care provider may diagnose PVD from your signs and symptoms. The health care provider will also do a physical exam. You may have tests to find out what is causing your PVD and determine its severity. Tests may include:  Blood pressure recordings from your arms and legs and measurements of the strength of your pulses (pulse volume recordings).  Imaging studies using sound waves to take pictures of   the blood flow through your blood vessels (Doppler ultrasound).  Injecting a dye into your blood vessels before having imaging studies using:  X-rays (angiogram or arteriogram).  Computer-generated X-rays (CT angiogram).  A powerful electromagnetic field and a computer (magnetic resonance angiogram or MRA). TREATMENT Treatment for PVD depends on the cause of your condition and the severity of your symptoms. It also depends on your age. Underlying  causes need to be treated and controlled. These include long-lasting (chronic) conditions, such as diabetes, high cholesterol, and high blood pressure. You may need to first try making lifestyle changes and taking medicines. Surgery may be needed if these do not work. Lifestyle changes may include:  Quitting smoking.  Exercising regularly.  Following a low-fat, low-cholesterol diet. Medicines may include:  Blood thinners to prevent blood clots.  Medicines to improve blood flow.  Medicines to improve your blood cholesterol levels. Surgical procedures may include:  A procedure that uses an inflated balloon to open a blocked artery and improve blood flow (angioplasty).  A procedure to put in a tube (stent) to keep a blocked artery open (stent implant).  Surgery to reroute blood flow around a blocked artery (peripheral bypass surgery).  Surgery to remove dead tissue from an infected wound on the affected limb.  Amputation. This is surgical removal of the affected limb. This may be necessary in cases of acute ischemic limb that are not improved through medical or surgical treatments. HOME CARE INSTRUCTIONS  Take medicines only as directed by your health care provider.  Do not use any tobacco products, including cigarettes, chewing tobacco, or electronic cigarettes. If you need help quitting, ask your health care provider.  Lose weight if you are overweight, and maintain a healthy weight as directed by your health care provider.  Eat a diet that is low in fat and cholesterol. If you need help, ask your health care provider.  Exercise regularly. Ask your health care provider to suggest some good activities for you.  Use compression stockings or other mechanical devices as directed by your health care provider.  Take good care of your feet.  Wear comfortable shoes that fit well.  Check your feet often for any cuts or sores. SEEK MEDICAL CARE IF:  You have cramps in your legs  while walking.  You have leg pain when you are at rest.  You have coldness in a leg or foot.  Your skin changes.  You have erectile dysfunction.  You have cuts or sores on your feet that are not healing. SEEK IMMEDIATE MEDICAL CARE IF:  Your arm or leg turns cold and blue.  Your arms or legs become red, warm, swollen, painful, or numb.  You have chest pain or trouble breathing.  You suddenly have weakness in your face, arm, or leg.  You become very confused or lose the ability to speak.  You suddenly have a very bad headache or lose your vision.   This information is not intended to replace advice given to you by your health care provider. Make sure you discuss any questions you have with your health care provider.   Document Released: 02/24/2004 Document Revised: 02/06/2014 Document Reviewed: 06/26/2013 [employer] Interactive Patient Education 2016 [employer]  

## 2015-11-09 NOTE — Progress Notes (Signed)
[facility] [place]   CC: Follow up peripheral artery occlusive disease  History of Present Illness Lance Dillon is a 75 y.o. male patient of Dr. Kellie Simmering who is s/p right common femoral to below knee popliteal artery bypass graft 07/19/2011 with right external iliac and common femoral artery endarterectomies.  He returns today for follow up. He has less claudication symptoms than he had before revascularization. After walking about 1/2 mile both calves are moderately painful, but the more he walks the pain lessens, relieved with rest. He walks a couple of miles 3-4 days/week. He has had injections in both hips which helped his pain. 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 Pt denies non healing wounds. Pt denies any history of stroke or TIA. His cardiologist, Dr. Ellyn Hack, checks his renal artery stenosis.  He has urinary incontinence and wears a clamp on his penis.  Pt states that Dr. 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 Pt states Dr. Estanislado Pandy found a  stenosis, and is hesitant to perform a catheter based procedure via his groin to address his brain aneurysm. Wife states Dr. Gwenlyn Found is monitoring his renal artery stenosis, pt states his blood pressure has remained good.  He had a kidney stone and pyelonephritis, had a procedure to address this in May 2017, then broke a tooth and had that addressed.  Wife states Dr. Duwayne Heck, neurosurgeon at [facility], states the brain aneurysm may not be able to be addressed.    Pt Diabetic: No Pt smoker: former smoker, quit in 2004  Pt meds include: Statin :Yes Betablocker: Yes ASA: Yes Other anticoagulants/antiplatelets: no    Past Medical History:  Diagnosis Date  . Arthritis   . Atherosclerosis of aortic bifurcation and common iliac arteries  [facility]) February 2017    Noted on abdominal/renal Dopplers  . Blind right eye   . 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 Cataract   . Colon polyps 2004, 07, 14  . Glaucoma    left eye   . Heart murmur    No evidence of valvular lesion noted on Echo 01/2010  . Hx of radiation therapy 03/22/11 to 05/08/11   PSA recurrent carcinoma of prostate  . Hypercholesterolemia   . Hypertension   . Macular degeneration   . Macular degeneration of both eyes   . Peripheral vascular disease [facility]) June 2013   Bilateral SFA stenosis: Status post right femoropopliteal bypass - Dr. Kellie Simmering  . 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 Prostate cancer ([place]) 2014  . Renal artery stenosis (HCC)    70% left  . Stones in the urinary tract   . Stroke ([place])   . Urinary incontinence     Social History Social History  Substance Use Topics  . Smoking status: Former Smoker    Packs/day: 3.00    Years: 30.00    Types: Cigarettes    Quit date: 07/01/2002  . Smokeless tobacco: Never Used  . Alcohol use No    Family History Family History  Problem Relation Age of Onset  . Prostate cancer Brother 53  .  Heart disease Brother     Before age 105  . Hypertension Brother   . Heart attack Brother   . Aneurysm Father   . Heart disease Father   . Hypertension Father   . Heart disease Mother     Before age 18  . Hypertension Mother   . Heart attack Mother   . Heart disease Sister   . Hypertension Sister   . Heart attack Sister   . Colon cancer Neg Hx     Past Surgical History:  Procedure Laterality Date  . ABDOMINAL AORTAGRAM N/A 06/20/2011   Procedure: ABDOMINAL Maxcine Ham;  Surgeon: Serafina Mitchell, MD;  Location: [facility] CATH LAB;  Service: Cardiovascular;  Laterality: N/A;  .  CARDIOVASCULAR STRESS TEST  07/06/2011   Evidence of mild ischemia in Basal Inferior and Mid Inferior regions, no significant wall motion abnormalities noted.  . clamp to penis     for urinary control  . COLONOSCOPY W/ BIOPSIES     multiple   . CYSTOSCOPY W/ URETERAL STENT PLACEMENT Left 06/07/2015   Procedure: CYSTOSCOPY, RETROGRADE PYLEGRAM  WITH STENT PLACEMENT;  Surgeon: Bjorn Loser, MD;  Location: [facility] ORS;  Service: Urology;  Laterality: Left;  . CYSTOSCOPY WITH RETROGRADE PYELOGRAM, URETEROSCOPY AND STENT PLACEMENT Left 07/28/2015   Procedure: CYSTOSCOPY, URETEROSCOPY, WITH BASKET STONE REMOVAL;  Surgeon: Raynelle Bring, MD;  Location: [facility] ORS;  Service: Urology;  Laterality: Left;  . EYE SURGERY Right Aug. 2016   Cataract; removed bilat  . FEMORAL-POPLITEAL BYPASS GRAFT  07/19/2011   Procedure: BYPASS GRAFT FEMORAL-POPLITEAL ARTERY;  Surgeon: Mal Misty, MD;  Location: [facility] OR;  Service: Vascular;  Laterality: Right;  Right Femoral - Popliteal  Bypss with saphenous vein  . HERNIA REPAIR     right side,lft  . INTRAOPERATIVE ARTERIOGRAM  07/19/2011   Procedure: INTRA OPERATIVE ARTERIOGRAM;  Surgeon: Mal Misty, MD;  Location: [place];  Service: Vascular;  Laterality: Right;  . NM MYOVIEW LTD  June 2013   Normal EF, very small area of basal-mid inferior ischemia; LOW RISK  . PENILE PROSTHESIS  REMOVAL    . PENILE PROSTHESIS IMPLANT    . PR VEIN BYPASS GRAFT,AORTO-FEM-POP  07/19/11   Right  Fem/Pop BPG  . PROSTATE BIOPSY    . prostatectomy retropubic radical  07/15/2002   with nerve sparing, Gleason 3+4=7  . Reclast  July 25, 2013  . RENAL DUPLEX  07/01/2012   Left renal artery 60-99% diameter reduction  . TRANSTHORACIC ECHOCARDIOGRAM  January   Normal EF and overall function  . TRANSTHORACIC ECHOCARDIOGRAM  02/23/2010   EF >62%, lv systolic function normal    Allergies  Allergen Reactions  . Avelox [Moxifloxacin Hcl In Nacl] Diarrhea    diarrhea  . Moxifloxacin Diarrhea     diarrhea    Current Outpatient Prescriptions  Medication Sig Dispense Refill  . amLODipine (NORVASC) 10 MG tablet Take 10 mg by mouth at bedtime.     Marland Kitchen aspirin 81 MG tablet Take 81 mg by mouth daily.    . Calcium Carbonate-Vitamin D (CALTRATE 600+D PO) Take 1 tablet by mouth daily.     . fluticasone (FLONASE) 50 MCG/ACT nasal spray Place 2 sprays into both nostrils as needed (for nasal congestion).     Marland Kitchen latanoprost (XALATAN) 0.005 % ophthalmic solution Place 1 drop into the left eye at bedtime.     Marland Kitchen losartan-hydrochlorothiazide (HYZAAR) 100-12.5 MG tablet Take 1 tablet by mouth daily.  0  . LUTEIN PO Take  1 tablet by mouth daily.    . metoprolol succinate (TOPROL-XL) 25 MG 24 hr tablet Take 1 tablet (25 mg total) by mouth daily. 90 tablet 3  . Omega-3 Fatty Acids (FISH OIL) 1200 MG CAPS Take 3,600 mg by mouth daily.    . rosuvastatin (CRESTOR) 20 MG tablet Take 10 mg by mouth daily.   1  . saccharomyces boulardii (FLORASTOR) 250 MG capsule Take 1 capsule (250 mg total) by mouth 2 (two) times daily. (Patient taking differently: Take 250 mg by mouth daily. ) 60 capsule 0  . HYDROcodone-acetaminophen (NORCO/VICODIN) 5-325 MG tablet Take 1-2 tablets by mouth every 6 (six) hours as needed. (Patient not taking: Reported on 11/09/2015) 25 tablet 0   No current facility-administered medications for this visit.     ROS: See HPI for pertinent positives and negatives.   Physical Examination  Vitals:   11/09/15 1103 11/09/15 1108  BP: 125/80 126/81  Pulse: 69 69  Resp: 16   Temp: 97 F (36.1 C)   SpO2: 98%   Weight: 163 lb (73.9 kg)   Height: 5\' 8"  (1.727 m)    Body mass index is 24.78 kg/m.  General: A&O x 3, WDWN. Gait: normal Eyes: PERRLA. Pulmonary: Respirations are non labored, CTAB, no wheezes, rales, or rhonchi. Cardiac: regular rhythm, no detected murmur.     Carotid Bruits Right Left   Negative Negative  Aorta is not palpable. Radial pulses: are 2+  palpable and =   VASCULAR EXAM: Extremities without ischemic changes  without Gangrene; without open wounds. No edema. All toes are pink with brisk capillary refill.      LE Pulses Right Left   FEMORAL 3+ palpable 2+ palpable    POPLITEAL not palpable  not palpable   POSTERIOR TIBIAL monophasic by Doppler   monophasic by Doppler    DORSALIS PEDIS  ANTERIOR TIBIAL monophasic by Doppler  monophasic by Doppler    Abdomen: soft, NT, no palapble masses. Clamp in place on penis. Skin: no rashes, no ulcers. Musculoskeletal: no muscle wasting or atrophy. Neurologic: A&O X 3; Appropriate Affect ; SENSATION: normal; MOTOR FUNCTION: moving all extremities equally, motor strength 5/5 throughout. Speech is fluent/normal. CN 2-12 intact.                  04/27/15 IR Angio: Approximately 9.7 mm in diameter versus a 20 mm in length large posterior communicating artery region aneurysm as described above. This has a increased in size since the previous study of 2015.  Occluded right internal carotid artery at the bulb.  Occluded left internal carotid artery at the petrous cavernous junction, with partial reconstitution of the left internal carotid artery in the distal cavernous and supraclinoid segments from collaterals from the internal maxillary artery, the anterior meningeal branch of the middle meningeal artery, and from the artery of the foramen of rotundum.  Occlusion at the left common femoral artery just above the inguinal ligament, with patency of the left profunda femoris artery which is associated with a 50-70% narrowing at its origin.   ASSESSMENT: Lance Dillon is a 75 y.o. male who is s/p right common femoral to below knee  popliteal artery bypass graft 07/19/2011 with right external iliac and common femoral artery endarterectomies.  Pt has mild bilateral calf claudication, no signs of ischemia in his feet/legs.  He was a 3 PPD smoker for many years until 2004 when he quit. Fortunately he does not have DM.  At a previous visit, I discussed  with Dr. Kellie Simmering that the ultrasound technician finds that the last several arterial Duplex of the right leg indicate occlusion of the graft with development of collateral perfusion. Will therefore check only ABI's on his follow up visit.   He takes a daily ASA and a statin.  On 05/04/15 Dr. Kellie Simmering reviewed the 04/27/15 IR angio report, spoke with pt and wife and examined pt. Pt asked Dr. Kellie Simmering to communicate with Dr. Estanislado Pandy re the contemplated intervention of his brain aneurysm and the occlusion at the left common femoral artery just above the inguinal ligament noted on the IR angiogram. Dr. Kellie Simmering told pt and wife that he would communicate with Dr. Kathee Delton re this.  I spoke with Dr. Kellie Simmering today; after reviewing IR report and previous non invasive vascular lab results, right femoral artery approach for contemplated access would be appropriate.   DATA ABI's and TBI's today are stable compared to 04/29/15, indicate moderate decrease of arterial perfusion to both ankles, all monophasic waveforms, TBI's remain 0.40 right and 0.36 left. Carotid duplex today suggests occluded right internal carotid artery.  The left extracranial internal carotid artery is patent with blunted velocities and tapered vessel , c/w history of intracranial occlusion.  No prior duplex exam performed at this facility for comparison, but findings c/w IR angio performed on 04/27/15.  PLAN:  Continue graduated walking program.  Based on the patient's vascular studies and examination, pt will return to clinic in 1 year with ABI's and carotid duplex. I advised pt and his wife to notify us if he develops  concerns re the circulation in his legs and we will see him sooner.   I discussed in depth with the patient the nature of atherosclerosis, and emphasized the importance of maximal medical management including strict control of blood pressure, blood glucose, and lipid levels, obtaining regular exercise, and continued cessation of smoking.  The patient is aware that without maximal medical management the underlying atherosclerotic disease process will progress, limiting the benefit of any interventions.  The patient was given information about PAD including signs, symptoms, treatment, what symptoms should prompt the patient to seek immediate medical care, and risk reduction measures to take.  Clemon Chambers, RN, MSN, FNP-C [facility] [employer] Phone: [phone]  Clinic MD: Early  11/09/15 11:16 AM

## 2015-11-09 NOTE — Telephone Encounter (Signed)
Spoke to Lance Dillon and wife. They do not feel as though they have any good answers from seeing Dr. Patsey Berthold at [facility]. The Lance Dillon and wife would like to know if he should have a follow-up MRI/MRA to see if there has been any change in the aneurysm since his angio done in March 2017. I will speak to Deveshwar and call them back. JM

## 2015-11-11 ENCOUNTER — Telehealth (HOSPITAL_COMMUNITY): Payer: Self-pay | Admitting: Radiology

## 2015-11-11 NOTE — Telephone Encounter (Signed)
Called pt, spoke to pt's spouse. Told her that Dr. Estanislado Pandy does want the pt to have a follow-up MRI/MRA. I will send request to insurance for authorization and call her back to schedule once I get auth. I will also forward the MRI/MRA from 2015 to [facility] as she requested. Pt's wife and pt agree to this plan. JM

## 2015-11-16 ENCOUNTER — Other Ambulatory Visit (HOSPITAL_COMMUNITY): Payer: Self-pay | Admitting: Interventional Radiology

## 2015-11-16 DIAGNOSIS — I771 Stricture of artery: Secondary | ICD-10-CM

## 2015-11-16 DIAGNOSIS — I729 Aneurysm of unspecified site: Secondary | ICD-10-CM

## 2015-11-25 ENCOUNTER — Ambulatory Visit (HOSPITAL_COMMUNITY)
Admission: RE | Admit: 2015-11-25 | Discharge: 2015-11-25 | Disposition: A | Discharge status: Home / Self Care | Payer: Medicare Other | Source: Ambulatory Visit | Attending: Interventional Radiology | Admitting: Interventional Radiology

## 2015-11-25 ENCOUNTER — Encounter (HOSPITAL_COMMUNITY): Payer: Self-pay

## 2015-11-25 ENCOUNTER — Ambulatory Visit (HOSPITAL_COMMUNITY): Admission: RE | Admit: 2015-11-25 | Payer: Medicare Other | Source: Ambulatory Visit

## 2015-11-25 DIAGNOSIS — I771 Stricture of artery: Secondary | ICD-10-CM | POA: Insufficient documentation

## 2015-11-25 DIAGNOSIS — I671 Cerebral aneurysm, nonruptured: Secondary | ICD-10-CM | POA: Insufficient documentation

## 2015-11-25 DIAGNOSIS — I6523 Occlusion and stenosis of bilateral carotid arteries: Secondary | ICD-10-CM | POA: Insufficient documentation

## 2015-11-25 DIAGNOSIS — I729 Aneurysm of unspecified site: Secondary | ICD-10-CM | POA: Diagnosis not present

## 2015-11-25 LAB — CREATININE, SERUM
Creatinine, Ser: 1.51 mg/dL — ABNORMAL HIGH (ref 0.61–1.24)
GFR calc Af Amer: 51 mL/min — ABNORMAL LOW (ref >60)
GFR calc non Af Amer: 44 mL/min — ABNORMAL LOW (ref >60)

## 2015-11-25 MED ORDER — GADOBENATE DIMEGLUMINE 529 MG/ML IV SOLN
15.0000 mL | Freq: Once | INTRAVENOUS | Status: AC
  Administered 2015-11-25: 15 mL via INTRAVENOUS

## 2015-12-03 ENCOUNTER — Telehealth (HOSPITAL_COMMUNITY): Payer: Self-pay

## 2015-12-03 NOTE — Telephone Encounter (Signed)
Pts wife agreed to have pt f/u in 6 months and to call 911 and office if pt has any symptoms. AW

## 2015-12-20 NOTE — Addendum Note (Signed)
Addended by: Lianne Cure A on: 12/20/2015 08:40 AM   Modules accepted: Orders

## 2016-02-03 DIAGNOSIS — R69 Illness, unspecified: Secondary | ICD-10-CM | POA: Diagnosis not present

## 2016-02-15 DIAGNOSIS — J329 Chronic sinusitis, unspecified: Secondary | ICD-10-CM | POA: Diagnosis not present

## 2016-02-22 DIAGNOSIS — C61 Malignant neoplasm of prostate: Secondary | ICD-10-CM | POA: Diagnosis not present

## 2016-02-23 ENCOUNTER — Other Ambulatory Visit: Payer: Self-pay

## 2016-02-23 ENCOUNTER — Other Ambulatory Visit: Payer: Self-pay | Admitting: *Deleted

## 2016-02-23 DIAGNOSIS — I701 Atherosclerosis of renal artery: Secondary | ICD-10-CM

## 2016-02-23 MED ORDER — METOPROLOL SUCCINATE ER 25 MG PO TB24: 25.0000 mg | ORAL_TABLET | Freq: Every day | ORAL | 0 refills | Status: DC

## 2016-02-25 ENCOUNTER — Other Ambulatory Visit: Payer: Self-pay

## 2016-02-25 DIAGNOSIS — I701 Atherosclerosis of renal artery: Secondary | ICD-10-CM

## 2016-02-25 MED ORDER — LOSARTAN POTASSIUM-HCTZ 100-12.5 MG PO TABS: 1.0000 | ORAL_TABLET | Freq: Every day | ORAL | 0 refills | Status: DC

## 2016-02-25 MED ORDER — ROSUVASTATIN CALCIUM 20 MG PO TABS: 10.0000 mg | ORAL_TABLET | Freq: Every day | ORAL | 0 refills | Status: DC

## 2016-02-25 MED ORDER — METOPROLOL SUCCINATE ER 25 MG PO TB24: 25.0000 mg | ORAL_TABLET | Freq: Every day | ORAL | 0 refills | Status: DC

## 2016-02-25 MED ORDER — AMLODIPINE BESYLATE 10 MG PO TABS: 10.0000 mg | ORAL_TABLET | Freq: Every day | ORAL | 0 refills | Status: DC

## 2016-02-29 ENCOUNTER — Telehealth: Payer: Self-pay | Admitting: *Deleted

## 2016-02-29 ENCOUNTER — Telehealth: Payer: Self-pay

## 2016-02-29 ENCOUNTER — Other Ambulatory Visit: Payer: Self-pay

## 2016-02-29 MED ORDER — ROSUVASTATIN CALCIUM 10 MG PO TABS: 10.0000 mg | ORAL_TABLET | Freq: Every day | ORAL | 0 refills | Status: DC

## 2016-02-29 NOTE — Telephone Encounter (Signed)
Confirmed generic Crestor is fine for patient to have per [place].

## 2016-02-29 NOTE — Telephone Encounter (Signed)
error 

## 2016-03-01 DIAGNOSIS — C61 Malignant neoplasm of prostate: Secondary | ICD-10-CM | POA: Diagnosis not present

## 2016-03-01 DIAGNOSIS — N393 Stress incontinence (female) (male): Secondary | ICD-10-CM | POA: Diagnosis not present

## 2016-03-01 DIAGNOSIS — R3129 Other microscopic hematuria: Secondary | ICD-10-CM | POA: Diagnosis not present

## 2016-03-01 MED ORDER — ROSUVASTATIN CALCIUM 10 MG PO TABS: 10.0000 mg | ORAL_TABLET | Freq: Every day | ORAL | 0 refills | Status: DC

## 2016-03-01 NOTE — Telephone Encounter (Signed)
PT'S MEDICATION KEPT GETTING SENT IN AS DAW AND NO PRINT, I FIXED IT AND SENT BACK TO PHARMACY WITH NOTE THEY CAN HAVE GENERIC.

## 2016-03-01 NOTE — Telephone Encounter (Signed)
rosuvastatin (CRESTOR) 10 MG tablet  Medication  Date: 02/29/2016 Department: [facility] Office Ordering/Authorizing: Leonie Man, MD  Order Providers   Prescribing Provider Encounter Provider  Leonie Man, MD Roberts Gaudy, CMA  Medication Detail    Disp Refills Start End   rosuvastatin (CRESTOR) 10 MG tablet 90 tablet 0 02/29/2016 05/29/2016   Sig - Route: Take 1 tablet (10 mg total) by mouth daily. - Oral   Class: No Print   Pharmacy   [facility] [phone] - [place], [place]

## 2016-03-06 ENCOUNTER — Encounter (HOSPITAL_BASED_OUTPATIENT_CLINIC_OR_DEPARTMENT_OTHER): Payer: Self-pay

## 2016-03-06 ENCOUNTER — Emergency Department (HOSPITAL_BASED_OUTPATIENT_CLINIC_OR_DEPARTMENT_OTHER): Payer: Medicare HMO

## 2016-03-06 DIAGNOSIS — Z7982 Long term (current) use of aspirin: Secondary | ICD-10-CM | POA: Diagnosis not present

## 2016-03-06 DIAGNOSIS — I10 Essential (primary) hypertension: Secondary | ICD-10-CM | POA: Diagnosis not present

## 2016-03-06 DIAGNOSIS — R05 Cough: Secondary | ICD-10-CM | POA: Diagnosis not present

## 2016-03-06 DIAGNOSIS — Z79899 Other long term (current) drug therapy: Secondary | ICD-10-CM | POA: Insufficient documentation

## 2016-03-06 DIAGNOSIS — Z8546 Personal history of malignant neoplasm of prostate: Secondary | ICD-10-CM | POA: Insufficient documentation

## 2016-03-06 DIAGNOSIS — Z8673 Personal history of transient ischemic attack (TIA), and cerebral infarction without residual deficits: Secondary | ICD-10-CM | POA: Insufficient documentation

## 2016-03-06 DIAGNOSIS — Z87891 Personal history of nicotine dependence: Secondary | ICD-10-CM | POA: Insufficient documentation

## 2016-03-06 DIAGNOSIS — J111 Influenza due to unidentified influenza virus with other respiratory manifestations: Secondary | ICD-10-CM | POA: Diagnosis not present

## 2016-03-06 NOTE — ED Triage Notes (Signed)
C/o flu like s/s x today-NAD-steady gait

## 2016-03-07 ENCOUNTER — Emergency Department (HOSPITAL_BASED_OUTPATIENT_CLINIC_OR_DEPARTMENT_OTHER)
Admission: EM | Admit: 2016-03-07 | Discharge: 2016-03-07 | Disposition: A | Discharge status: Home / Self Care | Payer: Medicare HMO | Attending: Emergency Medicine | Admitting: Emergency Medicine

## 2016-03-07 DIAGNOSIS — J111 Influenza due to unidentified influenza virus with other respiratory manifestations: Secondary | ICD-10-CM

## 2016-03-07 MED ORDER — OSELTAMIVIR PHOSPHATE 75 MG PO CAPS: 75.0000 mg | ORAL_CAPSULE | Freq: Two times a day (BID) | ORAL | 0 refills | Status: DC

## 2016-03-07 MED ORDER — ALBUTEROL SULFATE HFA 108 (90 BASE) MCG/ACT IN AERS
2.0000 | INHALATION_SPRAY | RESPIRATORY_TRACT | Status: DC | PRN
Start: 2016-03-07 — End: 2016-03-07
  Administered 2016-03-07: 2 via RESPIRATORY_TRACT
  Filled 2016-03-07: qty 6.7

## 2016-03-07 MED ORDER — ONDANSETRON 8 MG PO TBDP
8.0000 mg | ORAL_TABLET | Freq: Once | ORAL | Status: AC
  Administered 2016-03-07: 8 mg via ORAL
  Filled 2016-03-07: qty 1

## 2016-03-07 NOTE — ED Provider Notes (Signed)
[facility] DEPT [facility] Provider Note: Georgena Spurling, MD, FACEP  CSN: [id number] MRN: [id number] ARRIVAL: 03/06/16 at 2125 ROOM: [place]  Cough   HISTORY OF PRESENT ILLNESS  Lance Dillon is a 76 y.o. male with a one-day history of flulike symptoms. Specifically he has had a rattly cough, mild shortness of breath, body aches, nasal congestion and sore throat but no fever. He threw up twice yesterday which he describes as bilious. He is no longer nauseated. He has not had diarrhea. He is been taking Mucinex DM without adequate relief of cough. He has been exposed to influenza A in family members.   Past Medical History:  Diagnosis Date  . Arthritis   . Atherosclerosis of aortic bifurcation and common iliac arteries [facility]) February 2017    Noted on abdominal/renal Dopplers  . Blind right eye   . 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 Cataract   . Colon polyps 2004, 07, 14  . Glaucoma    left eye   . Heart murmur    No evidence of valvular lesion noted on Echo 01/2010  . Hx of radiation therapy 03/22/11 to 05/08/11   PSA recurrent carcinoma of prostate  . Hypercholesterolemia   . Hypertension   . Macular degeneration   . Macular degeneration of both eyes   . Peripheral vascular disease [facility]) June 2013   Bilateral SFA stenosis: Status post right femoropopliteal bypass - Dr. Kellie Simmering  . 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 Prostate cancer ([place]) 2014  . Renal artery stenosis (HCC)    70% left  . Stones in the urinary tract   . Stroke ([place])   . Urinary incontinence     Past Surgical History:  Procedure Laterality Date  . ABDOMINAL AORTAGRAM N/A 06/20/2011   Procedure: ABDOMINAL Maxcine Ham;  Surgeon: Serafina Mitchell, MD;   Location: [facility] CATH LAB;  Service: Cardiovascular;  Laterality: N/A;  . CARDIOVASCULAR STRESS TEST  07/06/2011   Evidence of mild ischemia in Basal Inferior and Mid Inferior regions, no significant wall motion abnormalities noted.  . clamp to penis     for urinary control  . COLONOSCOPY W/ BIOPSIES     multiple   . CYSTOSCOPY W/ URETERAL STENT PLACEMENT Left 06/07/2015   Procedure: CYSTOSCOPY, RETROGRADE PYLEGRAM  WITH STENT PLACEMENT;  Surgeon: Bjorn Loser, MD;  Location: [facility];  Service: Urology;  Laterality: Left;  . CYSTOSCOPY WITH RETROGRADE PYELOGRAM, URETEROSCOPY AND STENT PLACEMENT Left 07/28/2015   Procedure: CYSTOSCOPY, URETEROSCOPY, WITH BASKET STONE REMOVAL;  Surgeon: Raynelle Bring, MD;  Location: [facility];  Service: Urology;  Laterality: Left;  . EYE SURGERY Right Aug. 2016   Cataract; removed bilat  . FEMORAL-POPLITEAL BYPASS GRAFT  07/19/2011   Procedure: BYPASS GRAFT FEMORAL-POPLITEAL ARTERY;  Surgeon: Mal Misty, MD;  Location: [facility] OR;  Service: Vascular;  Laterality: Right;  Right Femoral - Popliteal  Bypss with saphenous vein  . HERNIA REPAIR     right side,lft  . INTRAOPERATIVE ARTERIOGRAM  07/19/2011   Procedure: INTRA OPERATIVE ARTERIOGRAM;  Surgeon: Mal Misty, MD;  Location: [facility];  Service: Vascular;  Laterality: Right;  . NM MYOVIEW LTD  June 2013   Normal EF, very small area of basal-mid inferior ischemia; LOW RISK  .  PENILE PROSTHESIS  REMOVAL    . PENILE PROSTHESIS IMPLANT    . PR VEIN BYPASS GRAFT,AORTO-FEM-POP  07/19/11   Right  Fem/Pop BPG  . PROSTATE BIOPSY    . prostatectomy retropubic radical  07/15/2002   with nerve sparing, Gleason 3+4=7  . Reclast  July 25, 2013  . RENAL DUPLEX  07/01/2012   Left renal artery 60-99% diameter reduction  . TRANSTHORACIC ECHOCARDIOGRAM  January   Normal EF and overall function  . TRANSTHORACIC ECHOCARDIOGRAM  02/23/2010   EF >16%, lv systolic function normal    Family History  Problem Relation Age of Onset  .  Prostate cancer Brother 83  . Heart disease Brother     Before age 76  . Hypertension Brother   . Heart attack Brother   . Aneurysm Father   . Heart disease Father   . Hypertension Father   . Heart disease Mother     Before age 20  . Hypertension Mother   . Heart attack Mother   . Heart disease Sister   . Hypertension Sister   . Heart attack Sister   . Colon cancer Neg Hx     Social History  Substance Use Topics  . Smoking status: Former Smoker    Packs/day: 3.00    Years: 30.00    Types: Cigarettes    Quit date: 07/01/2002  . Smokeless tobacco: Never Used  . Alcohol use No    Prior to Admission medications   Medication Sig Start Date End Date Taking? Authorizing Provider  amLODipine (NORVASC) 10 MG tablet Take 1 tablet (10 mg total) by mouth at bedtime. 02/25/16   Leonie Man, MD  aspirin 81 MG tablet Take 81 mg by mouth daily.    Historical Provider, MD  Calcium Carbonate-Vitamin D (CALTRATE 600+D PO) Take 1 tablet by mouth daily.     Historical Provider, MD  fluticasone (FLONASE) 50 MCG/ACT nasal spray Place 2 sprays into both nostrils as needed (for nasal congestion).  09/02/12   Historical Provider, MD  latanoprost (XALATAN) 0.005 % ophthalmic solution Place 1 drop into the left eye at bedtime.  09/18/13   Historical Provider, MD  losartan-hydrochlorothiazide (HYZAAR) 100-12.5 MG tablet Take 1 tablet by mouth daily. PLEASE MAKE APPOINTMENT FOR FURTHER REFILLS 02/25/16   Leonie Man, MD  LUTEIN PO Take 1 tablet by mouth daily.    Historical Provider, MD  metoprolol succinate (TOPROL-XL) 25 MG 24 hr tablet Take 1 tablet (25 mg total) by mouth daily. PLEASE MAKE APPOINTMENT FOR FURTHER REFILLS 02/25/16   Leonie Man, MD  Omega-3 Fatty Acids (FISH OIL) 1200 MG CAPS Take 3,600 mg by mouth daily.    Historical Provider, MD  oseltamivir (TAMIFLU) 75 MG capsule Take 1 capsule (75 mg total) by mouth 2 (two) times daily. 03/07/16   Shanon Rosser, MD  rosuvastatin (CRESTOR) 10 MG  tablet Take 1 tablet (10 mg total) by mouth daily. 03/01/16 05/30/16  Leonie Man, MD  saccharomyces boulardii (FLORASTOR) 250 MG capsule Take 1 capsule (250 mg total) by mouth 2 (two) times daily. Patient taking differently: Take 250 mg by mouth daily.  06/10/15   Verlee Monte, MD    Allergies Avelox [moxifloxacin hcl in nacl] and Moxifloxacin   REVIEW OF SYSTEMS  Negative except as noted here or in the History of Present Illness.   PHYSICAL EXAMINATION  Initial Vital Signs Blood pressure 129/79, pulse 96, temperature 98.1 F (36.7 C), temperature source Oral, resp. rate 20,  height 5\' 8"  (1.727 m), weight 161 lb (73 kg), SpO2 97 %.  Examination General: Well-developed, well-nourished male in no acute distress; appearance consistent with age of record HENT: normocephalic; atraumatic; no pharyngeal erythema or exudate Eyes: pupils equal, round and reactive to light; extraocular muscles intact; lens implants Neck: supple Heart: regular rate and rhythm Lungs: clear to auscultation bilaterally; mild tachypnea; rattly cough Abdomen: soft; nondistended; nontender; bowel sounds present Extremities: No deformity; full range of motion; pulses normal Neurologic: Awake, alert and oriented; motor function intact in all extremities and symmetric; no facial droop Skin: Warm and dry Psychiatric: Normal mood and affect   RESULTS  Summary of this visit's results, reviewed by myself:   EKG Interpretation  Date/Time:    Ventricular Rate:    PR Interval:    QRS Duration:   QT Interval:    QTC Calculation:   R Axis:     Text Interpretation:        Laboratory Studies: No results found for this or any previous visit (from the past 24 hour(s)). Imaging Studies: Dg Chest 2 View  Result Date: 03/06/2016 CLINICAL DATA:  Flu-like symptoms and cough EXAM: CHEST  2 VIEW COMPARISON:  Chest radiograph 07/28/2015 FINDINGS: Unchanged cardiomediastinal contours with atherosclerotic calcification in  the aortic arch. Bibasilar atelectasis is unchanged. No focal consolidation or pulmonary edema. No pneumothorax or pleural effusion. Unchanged thoracic dextroscoliosis. IMPRESSION: Aortic atherosclerosis and unchanged left basilar atelectasis. No consolidation. Electronically Signed   By: Ulyses Jarred M.D.   On: 03/06/2016 22:07    ED COURSE  Nursing notes and initial vitals signs, including pulse oximetry, reviewed.  Vitals:   03/06/16 2141 03/07/16 0016  BP: 141/77 129/79  Pulse: 111 96  Resp: 24 20  Temp: 98.7 F (37.1 C) 98.1 F (36.7 C)  TempSrc: Oral Oral  SpO2: 97% 97%  Weight: 161 lb (73 kg)   Height: 5\' 8"  (1.727 m)    We will provide an albuterol inhaler and placement Tamiflu. He was advised to continue the Mucinex DM.  PROCEDURES    ED DIAGNOSES     ICD-9-CM ICD-10-CM   1. Influenza 487.1 J11.1        Shanon Rosser, MD 03/07/16 0145

## 2016-03-17 DIAGNOSIS — H353133 Nonexudative age-related macular degeneration, bilateral, advanced atrophic without subfoveal involvement: Secondary | ICD-10-CM | POA: Diagnosis not present

## 2016-03-17 DIAGNOSIS — H401121 Primary open-angle glaucoma, left eye, mild stage: Secondary | ICD-10-CM | POA: Diagnosis not present

## 2016-03-30 ENCOUNTER — Ambulatory Visit (INDEPENDENT_AMBULATORY_CARE_PROVIDER_SITE_OTHER): Payer: Medicare HMO | Admitting: Cardiology

## 2016-03-30 VITALS — BP 110/70 | HR 74 | Ht 68.0 in | Wt 163.0 lb

## 2016-03-30 DIAGNOSIS — I10 Essential (primary) hypertension: Secondary | ICD-10-CM

## 2016-03-30 DIAGNOSIS — I70213 Atherosclerosis of native arteries of extremities with intermittent claudication, bilateral legs: Secondary | ICD-10-CM

## 2016-03-30 DIAGNOSIS — E785 Hyperlipidemia, unspecified: Secondary | ICD-10-CM

## 2016-03-30 DIAGNOSIS — I701 Atherosclerosis of renal artery: Secondary | ICD-10-CM

## 2016-03-30 DIAGNOSIS — I739 Peripheral vascular disease, unspecified: Secondary | ICD-10-CM | POA: Diagnosis not present

## 2016-03-30 MED ORDER — METOPROLOL SUCCINATE ER 25 MG PO TB24: 25.0000 mg | ORAL_TABLET | Freq: Every day | ORAL | 3 refills | Status: DC

## 2016-03-30 NOTE — Patient Instructions (Addendum)
NO CHANGES WITH  CURRENT MEDICATIONS     Your physician recommends that you schedule a follow-up appointment IN OCT 2018 WITH DR BERRY ONLY, you do not need to see Dr Ellyn Hack anymore - continue with Dr Gwenlyn Found

## 2016-03-30 NOTE — Progress Notes (Signed)
PCP: Lance Sheller, MD  Clinic Note: Chief Complaint  Patient presents with  . Follow-up    1 Year, pt denied chest pain and SOB  . PAD    HPI: Lance Dillon is a 76 y.o. male with a PMH below who presents today for Annual follow-up. He is a former patient Dr. Rollene Dillon. He was last seen in January of this year. He has known PAD (with left renal artery stenosis of 70%, status post right femoropopliteal bypass graft in June of 2013) as well as a strong family history of CAD. To date, his cardiac evaluation has been benign. He was actually seen by Dr. Rollene Dillon for the first time as part of preoperative evaluation for his vascular surgery. His carotid disease being followed Dr. Kellie Dillon.Lance Dillon was last seen on February 2017. He was doing well without significant complaints besides bilateral calf claudication.  Recent Hospitalizations: ED visit - Flu 03/08/2015  Studies Reviewed: Vascular Studies Sees Dr. Gwenlyn Dillon this month for RAS  Interval History: Lance Dillon today feeling quite well without any major complaints. He really hasn't noted much the way of any claudication symptoms. No TIA or amaurosis fugax symptoms. He is always been stable from a cardiac standpoint without any real cardiac history. Cardiovascular review of symptoms:   No chest pain or shortness of breath with rest or exertion.   No PND, orthopnea or edema.   o palpitations, lightheadedness, dizziness, weakness or syncope/near syncope.  No TIA/amaurosis fugax symptoms.  Stable, nonlimiting claudication.  ROS: A comprehensive was performed. Review of Systems  Constitutional:       Starting to recover from the flu.  HENT: Negative for nosebleeds.   Respiratory: Negative for cough, shortness of breath and wheezing.   Gastrointestinal: Negative for blood in stool, constipation, heartburn and melena.  Genitourinary: Negative for hematuria.  Musculoskeletal: Positive for joint pain (Arthritis).    Psychiatric/Behavioral: Negative for memory loss.  All other systems reviewed and are negative.   Past Medical History:  Diagnosis Date  . Arthritis   . Atherosclerosis of aortic bifurcation and common iliac arteries [facility]) February 2017    Noted on abdominal/renal Dopplers  . Blind right eye   . 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 Cataract   . Colon polyps 2004, 07, 14  . Glaucoma    left eye   . Heart murmur    No evidence of valvular lesion noted on Echo 01/2010  . Hx of radiation therapy 03/22/11 to 05/08/11   PSA recurrent carcinoma of prostate  . Hypercholesterolemia   . Hypertension   . Macular degeneration   . Macular degeneration of both eyes   . Peripheral vascular disease [facility]) June 2013   Bilateral SFA stenosis: Status post right femoropopliteal bypass - Dr. Kellie Dillon  . 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 Prostate cancer ([place]) 2014  . Renal artery stenosis (HCC)    70% left  . Stones in the urinary tract   . Stroke ([place])   . Urinary incontinence     Past Surgical History:  Procedure Laterality Date  . ABDOMINAL AORTAGRAM N/A 06/20/2011   Procedure: ABDOMINAL Lance Dillon;  Surgeon: Lance Mitchell, MD;  Location: [facility] CATH LAB;  Service: Cardiovascular;  Laterality: N/A;  . CARDIOVASCULAR STRESS TEST  07/06/2011   Evidence of mild ischemia in Basal Inferior and Mid Inferior regions, no significant wall motion abnormalities noted.  . clamp to penis     for urinary control  . COLONOSCOPY W/ BIOPSIES     multiple   . CYSTOSCOPY W/ URETERAL STENT PLACEMENT Left 06/07/2015   Procedure: CYSTOSCOPY, RETROGRADE PYLEGRAM  WITH STENT PLACEMENT;  Surgeon: Lance Loser, MD;  Location: [facility] ORS;  Service: Urology;  Laterality: Left;  . CYSTOSCOPY  WITH RETROGRADE PYELOGRAM, URETEROSCOPY AND STENT PLACEMENT Left 07/28/2015   Procedure: CYSTOSCOPY, URETEROSCOPY, WITH BASKET STONE REMOVAL;  Surgeon: Lance Bring, MD;  Location: [facility] ORS;  Service: Urology;  Laterality: Left;  . EYE SURGERY Right Aug. 2016   Cataract; removed bilat  . FEMORAL-POPLITEAL BYPASS GRAFT  07/19/2011   Procedure: BYPASS GRAFT FEMORAL-POPLITEAL ARTERY;  Surgeon: Lance Misty, MD;  Location: [facility] OR;  Service: Vascular;  Laterality: Right;  Right Femoral - Popliteal  Bypss with saphenous vein  . HERNIA REPAIR     right side,lft  . INTRAOPERATIVE ARTERIOGRAM  07/19/2011   Procedure: INTRA OPERATIVE ARTERIOGRAM;  Surgeon: Lance Misty, MD;  Location: [facility];  Service: Vascular;  Laterality: Right;  . NM MYOVIEW LTD  June 2013   Normal EF, very small area of basal-mid inferior ischemia; LOW RISK  . PENILE PROSTHESIS  REMOVAL    . PENILE PROSTHESIS IMPLANT    . PR VEIN BYPASS GRAFT,AORTO-FEM-POP  07/19/11   Right  Fem/Pop BPG  . PROSTATE BIOPSY    . prostatectomy retropubic radical  07/15/2002   with nerve sparing, Gleason 3+4=7  . Reclast  July 25, 2013  . RENAL DUPLEX  07/01/2012   Left renal artery 60-99% diameter reduction  . TRANSTHORACIC ECHOCARDIOGRAM  January   Normal EF and overall function  . TRANSTHORACIC ECHOCARDIOGRAM  02/23/2010   EF >25%, lv systolic function normal    Current Meds  Medication Sig  . amLODipine (NORVASC) 10 MG tablet Take 1 tablet (10 mg total) by mouth at bedtime.  Marland Kitchen aspirin 81 MG tablet Take 81 mg by mouth daily.  . Calcium Carbonate-Vitamin D (CALTRATE 600+D PO) Take 1 tablet by mouth daily.   . fluticasone (FLONASE) 50 MCG/ACT nasal spray Place 2 sprays into both nostrils as needed (for nasal congestion).   Marland Kitchen latanoprost (XALATAN) 0.005 % ophthalmic solution Place 1 drop into the left eye at bedtime.   Marland Kitchen losartan-hydrochlorothiazide (HYZAAR) 100-12.5 MG tablet Take 1 tablet by mouth daily. PLEASE MAKE APPOINTMENT FOR FURTHER  REFILLS  . LUTEIN PO Take 1 tablet by mouth daily.  . metoprolol succinate (TOPROL-XL) 25 MG 24 hr tablet Take 1 tablet (25 mg total) by mouth daily.  . Omega-3 Fatty Acids (FISH OIL) 1200 MG CAPS Take 3,600 mg by mouth daily.  Marland Kitchen oseltamivir (TAMIFLU) 75 MG capsule Take 1 capsule (75 mg total) by mouth 2 (two) times daily.  . rosuvastatin (CRESTOR) 10 MG tablet Take 1 tablet (10 mg total) by mouth daily.  Marland Kitchen saccharomyces boulardii (FLORASTOR) 250 MG capsule Take 1 capsule (250 mg total) by mouth 2 (two) times daily. (Patient taking differently: Take 250 mg by mouth daily. )  . [DISCONTINUED] metoprolol succinate (TOPROL-XL) 25 MG 24 hr tablet Take 1 tablet (25 mg total) by mouth daily. PLEASE MAKE APPOINTMENT FOR FURTHER REFILLS    Allergies  Allergen Reactions  . Avelox [Moxifloxacin Hcl In Nacl] Diarrhea    diarrhea  . Moxifloxacin Diarrhea    diarrhea    Social  History   Social History  . Marital status: Married    Spouse name: N/A  . Number of children: N/A  . Years of education: N/A   Occupational History  . Boiler [employer]   Social History Main Topics  . Smoking status: Former Smoker    Packs/day: 3.00    Years: 30.00    Types: Cigarettes    Quit date: 07/01/2002  . Smokeless tobacco: Never Used  . Alcohol use No  . Drug use: No  . Sexual activity: Not Asked   Other Topics Concern  . None   Social History Narrative   Married, 2 children , Oncologist - predominantly commercial, but also some residential Insurance underwriter.   Usually walks 2 miles maybe 2-3 days a week. Just not a cold weather.   He is a former smoker, quit "cold [place] "after 3 pack per day for 60 pack years in 2004    family history includes Aneurysm in his father; Heart attack in his brother, mother, and sister; Heart disease in his brother, father, mother, and sister; Hypertension in his brother, father, mother, and sister; Prostate cancer (age of onset: 87) in  his brother.  Wt Readings from Last 3 Encounters:  03/30/16 73.9 kg (163 lb)  03/06/16 73 kg (161 lb)  11/09/15 73.9 kg (163 lb)    PHYSICAL EXAM BP 110/70   Pulse 74   Ht 5\' 8"  (1.727 m)   Wt 73.9 kg (163 lb)   BMI 24.78 kg/m  General appearance: alert, cooperative, appears stated age, no distress and Pleasant mood and affect. Answers questions appropriately. Well-nourished well-groomed. HEENT: [place]/AT, EOMI, MMM, anicteric sclera Neck: no adenopathy, no carotid bruit, no JVD and supple, symmetrical, trachea midline Lungs:CTAB, normal percussion bilaterally and Nonlabored, good air movement Heart:RRR, S1&S2 normal and Possible soft S4, 2/6 systolic murmur at LUSB Abdomen: soft, non-tender; bowel sounds normal; no masses, no organomegaly Extremities: extremities normal, atraumatic, no cyanosis or edema Pulses: Weak/faint DP and PT of the left - trace to 1+. Also faint DP and PT on the right. No femoral bruit. Neurologic: Grossly normal    Adult ECG Report  Rate: 74 ;  Rhythm: normal sinus rhythm, sinus arrhythmia and Otherwise normal axis, intervals and durations;   Narrative Interpretation: Stable EKG   Other studies Reviewed: Additional studies/ records that were reviewed today include:  Recent Labs:  Followed by PCP   ASSESSMENT / PLAN: Problem List Items Addressed This Visit    Atherosclerosis of native artery of extremity with intermittent claudication (HCC) - Primary (Chronic)   Relevant Medications   metoprolol succinate (TOPROL-XL) 25 MG 24 hr tablet   Other Relevant Orders   EKG 12-Lead (Completed)   Essential hypertension (Chronic)    Well-controlled on current management next dose amlodipine as well as Hyzaar along with low-dose Toprol      Relevant Medications   metoprolol succinate (TOPROL-XL) 25 MG 24 hr tablet   Hyperlipidemia LDL goal <70 (Chronic)    Labs followed by PCP. He is on stable low-dose Crestor. -- Target LDL less than 70 based on his  extensive peripheral vascular/carotid disease.      Relevant Medications   metoprolol succinate (TOPROL-XL) 25 MG 24 hr tablet   Other Relevant Orders   EKG 12-Lead (Completed)   Left renal artery stenosis - 70% by angiography; increased velocity on recent Dopplers (60-99%) (Chronic)    Followed by Dr. Gwenlyn Dillon - next scheduled appointment should be  withdrawn about a month.  New cardiovascular risk management as noted above.  He will follow up with Dr. Gwenlyn Dillon moving toward      Relevant Medications   metoprolol succinate (TOPROL-XL) 25 MG 24 hr tablet   Other Relevant Orders   EKG 12-Lead (Completed)   Peripheral vascular disease (HCC) (Chronic)    Stable claudication symptoms. Nothing that limits his overall activity. Had been followed by vascular surgery, but now with Dr. Kellie Dillon is retired, he would like to just consolidate his vascular care. Since he is following Dr. Gwenlyn Dillon for his renal artery stenosis and carotid disease, he will simply follow-up with Dr. Gwenlyn Dillon for all of his cardiovascular disease.  He is on aspirin and statin beta blocker and ARB. Recommend continued ambulation.      Relevant Medications   metoprolol succinate (TOPROL-XL) 25 MG 24 hr tablet    Other Visit Diagnoses    RAS (renal artery stenosis) (HCC)       Relevant Medications   metoprolol succinate (TOPROL-XL) 25 MG 24 hr tablet   Other Relevant Orders   EKG 12-Lead (Completed)      Current medicines are reviewed at length with the patient today. (+/- concerns) None The following changes have been made: None  Patient Instructions  NO CHANGES WITH  CURRENT MEDICATIONS     Your physician recommends that you schedule a follow-up appointment IN OCT 2018 WITH DR BERRY ONLY, you do not need to see Dr Ellyn Hack anymore - continue with Dr Lance Dillon    Studies Ordered:   Orders Placed This Encounter  Procedures  . EKG 12-Lead      Glenetta Hew, M.D., M.S. Interventional Cardiologist   Pager #  [phone] Phone # [phone] [address] [place], [place] [address]

## 2016-04-01 ENCOUNTER — Encounter: Payer: Self-pay | Admitting: Cardiology

## 2016-04-01 NOTE — Assessment & Plan Note (Signed)
Well-controlled on current management next dose amlodipine as well as Hyzaar along with low-dose Toprol

## 2016-04-01 NOTE — Assessment & Plan Note (Signed)
Followed by Dr. Gwenlyn Found - next scheduled appointment should be withdrawn about a month.  New cardiovascular risk management as noted above.  He will follow up with Dr. Gwenlyn Found moving toward

## 2016-04-01 NOTE — Assessment & Plan Note (Signed)
Stable claudication symptoms. Nothing that limits his overall activity. Had been followed by vascular surgery, but now with Dr. Kellie Simmering is retired, he would like to just consolidate his vascular care. Since he is following Dr. Gwenlyn Found for his renal artery stenosis and carotid disease, he will simply follow-up with Dr. Gwenlyn Found for all of his cardiovascular disease.  He is on aspirin and statin beta blocker and ARB. Recommend continued ambulation.

## 2016-04-01 NOTE — Assessment & Plan Note (Signed)
Labs followed by PCP. He is on stable low-dose Crestor. -- Target LDL less than 70 based on his extensive peripheral vascular/carotid disease.

## 2016-04-19 ENCOUNTER — Ambulatory Visit (HOSPITAL_COMMUNITY)
Admission: RE | Admit: 2016-04-19 | Discharge: 2016-04-19 | Disposition: A | Discharge status: Home / Self Care | Payer: Medicare HMO | Source: Ambulatory Visit | Attending: Cardiovascular Disease | Admitting: Cardiovascular Disease

## 2016-04-19 DIAGNOSIS — I700 Atherosclerosis of aorta: Secondary | ICD-10-CM | POA: Insufficient documentation

## 2016-04-19 DIAGNOSIS — I701 Atherosclerosis of renal artery: Secondary | ICD-10-CM | POA: Insufficient documentation

## 2016-04-19 DIAGNOSIS — I708 Atherosclerosis of other arteries: Secondary | ICD-10-CM | POA: Insufficient documentation

## 2016-04-19 DIAGNOSIS — I159 Secondary hypertension, unspecified: Secondary | ICD-10-CM

## 2016-04-25 ENCOUNTER — Telehealth: Payer: Self-pay | Admitting: Cardiovascular Disease

## 2016-04-25 NOTE — Telephone Encounter (Signed)
Notes recorded by Lorretta Harp, MD on 04/21/2016 at 1:27 PM EDT Return office visit with me to discuss results at next available  Wife notified-appt scheduled for [facility] 05-17-16@1020am . Wife is asking if this is going to need stenting, re-assured wife that if there was anything to do before appt MD would have informed us to let them know.

## 2016-04-25 NOTE — Telephone Encounter (Signed)
Lance Dillon is calling to see if the results are back from [name] test he had last week . Please call

## 2016-05-17 ENCOUNTER — Encounter: Payer: Self-pay | Admitting: Cardiovascular Disease

## 2016-05-17 ENCOUNTER — Ambulatory Visit (INDEPENDENT_AMBULATORY_CARE_PROVIDER_SITE_OTHER): Payer: Medicare HMO | Admitting: Cardiovascular Disease

## 2016-05-17 VITALS — BP 100/62 | HR 76 | Ht 68.0 in | Wt 164.0 lb

## 2016-05-17 DIAGNOSIS — E785 Hyperlipidemia, unspecified: Secondary | ICD-10-CM | POA: Diagnosis not present

## 2016-05-17 DIAGNOSIS — I10 Essential (primary) hypertension: Secondary | ICD-10-CM

## 2016-05-17 DIAGNOSIS — I701 Atherosclerosis of renal artery: Secondary | ICD-10-CM | POA: Diagnosis not present

## 2016-05-17 NOTE — Patient Instructions (Signed)
Medication Instructions: Your physician recommends that you continue on your current medications as directed. Please refer to the Current Medication list given to you today.   Testing/Procedures: Your physician has requested that you have a renal artery duplex in 6 months--prior to follow-up appt. During this test, an ultrasound is used to evaluate blood flow to the kidneys. Allow one hour for this exam. Do not eat after midnight the day before and avoid carbonated beverages. Take your medications as you usually do.   Follow-Up: Your physician wants you to follow-up in: 6 months with Dr. Gwenlyn Found. You will receive a reminder letter in the mail two months in advance. If you don't receive a letter, please call our office to schedule the follow-up appointment.  If you need a refill on your cardiac medications before your next appointment, please call your pharmacy.

## 2016-05-17 NOTE — Assessment & Plan Note (Signed)
History of hypertension blood pressure measured 100/62. He is on amlodipine, losartan and hydrochlorothiazide as well as metoprolol. Continue current meds at current dosing

## 2016-05-17 NOTE — Assessment & Plan Note (Signed)
History of peripheral arterial disease status post right femoropopliteal bypass grafting by Dr. Kellie Simmering which they follow by duplex ultrasound as an outpatient. He denies claudication.

## 2016-05-17 NOTE — Assessment & Plan Note (Signed)
History of hyperlipidemia on statin therapy followed by his PCP 

## 2016-05-17 NOTE — Progress Notes (Signed)
05/17/2016 ASHRAF MESTA   05-12-1940  [id number]  Primary Physician Thressa Sheller, MD Primary Cardiologist: Lorretta Harp MD Renae Gloss  HPI:  Mr. Cacciola is a very pleasant 76 year old married Caucasian male formally a patient of Dr. Georgiann Mccoy and currently of Dr. Shanon Brow Harding's. I last saw him in the office 02/19/15.He has no prior cardiac history. He does have a history of hypertension and hyperlipidemia. He had angiography performed by Dr. Trula Slade 06/20/11 the setting of right lower extremely claudication. At the time of angiography he had demonstrated 70% left renal artery stenosis. He ultimately underwent right femoropopliteal bypass grafting by Dr. Kellie Simmering. Following his renal Doppler studies semiannually which have demonstrated a mild progressive decline in renal dimensions as well as increase in velocities. His left renal dimension is still 10.9 cm. 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 His most recent renal Doppler studies performed 04/19/16 revealed a left renal aortic ratio of 6.64 with a left renal dimension that had decreased to 1 cm. He denies chest pain or shortness of breath since I saw him last.   Current Outpatient Prescriptions  Medication Sig Dispense Refill  . amLODipine (NORVASC) 10 MG tablet Take 1 tablet (10 mg total) by mouth at bedtime. 90 tablet 0  . aspirin 81 MG tablet Take 81 mg by mouth daily.    . Calcium Carbonate-Vitamin D (CALTRATE 600+D PO) Take 1 tablet by mouth daily.     . fluticasone (FLONASE) 50 MCG/ACT nasal spray Place 2 sprays into both nostrils as needed (for nasal congestion).     Marland Kitchen latanoprost (XALATAN) 0.005 % ophthalmic solution Place 1 drop into the left eye at bedtime.     Marland Kitchen losartan-hydrochlorothiazide (HYZAAR) 100-12.5 MG tablet Take 1 tablet by mouth daily. PLEASE MAKE  APPOINTMENT FOR FURTHER REFILLS 90 tablet 0  . LUTEIN PO Take 1 tablet by mouth daily.    . metoprolol succinate (TOPROL-XL) 25 MG 24 hr tablet Take 1 tablet (25 mg total) by mouth daily. 90 tablet 3  . Omega-3 Fatty Acids (FISH OIL) 1200 MG CAPS Take 3,600 mg by mouth daily.    . rosuvastatin (CRESTOR) 10 MG tablet Take 1 tablet (10 mg total) by mouth daily. 90 tablet 0  . saccharomyces boulardii (FLORASTOR) 250 MG capsule Take 1 capsule (250 mg total) by mouth 2 (two) times daily. (Patient taking differently: Take 250 mg by mouth daily. ) 60 capsule 0   No current facility-administered medications for this visit.     Allergies  Allergen Reactions  . Avelox [Moxifloxacin Hcl In Nacl] Diarrhea    diarrhea  . Moxifloxacin Diarrhea    diarrhea    Social History   Social History  . Marital status: Married    Spouse name: N/A  . Number of children: N/A  . Years of education: N/A   Occupational History  . Boiler [employer]   Social History Main Topics  . Smoking status: Former Smoker    Packs/day: 3.00    Years: 30.00    Types: Cigarettes    Quit date: 07/01/2002  . Smokeless tobacco: Never Used  . Alcohol use No  . Drug use: No  . Sexual activity: Not on file   Other Topics Concern  . 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   Usually walks 2 miles maybe 2-3 days a week. Just not a cold weather.   He is a former smoker, quit "cold [place] "after 3 pack per day for 60 pack years in 2004     Review of Systems: General: negative for chills, fever, night sweats or weight changes.  Cardiovascular: negative for chest pain, dyspnea on exertion, edema, orthopnea, palpitations, paroxysmal nocturnal dyspnea or shortness of breath Dermatological: negative for rash Respiratory: negative for cough or wheezing Urologic: negative for  hematuria Abdominal: negative for nausea, vomiting, diarrhea, bright red blood per rectum, melena, or hematemesis Neurologic: negative for visual changes, syncope, or dizziness All other systems reviewed and are otherwise negative except as noted above.    Blood pressure 100/62, pulse 76, height 5\' 8"  (1.727 m), weight 164 lb (74.4 kg).  General appearance: alert and no distress Neck: no adenopathy, no carotid bruit, no JVD, supple, symmetrical, trachea midline and thyroid not enlarged, symmetric, no tenderness/mass/nodules Lungs: clear to auscultation bilaterally Heart: regular rate and rhythm, S1, S2 normal, no murmur, click, rub or gallop Extremities: extremities normal, atraumatic, no cyanosis or edema  EKG not performed today  ASSESSMENT AND PLAN:   Atherosclerosis of native artery of extremity with intermittent claudication (HCC) History of peripheral arterial disease status post right femoropopliteal bypass grafting by Dr. Kellie Simmering which they follow by duplex ultrasound as an outpatient. He denies claudication.  Left renal artery stenosis - 70% by angiography; increased velocity on recent Dopplers (60-99%) History of 70% left renal artery stenosis found by angiography at the time of femoropopliteal bypass grafting. We have been following this by duplex ultrasound which has revealed this to be stable however his most recent Dopplers performed 04/19/16 revealed mild decrease in his left renal dimensions down from 10 cm measuring 9 cm. His renal aortic velocity has increased somewhat as well. We will continue to follow this noninvasively. Blood pressure is under good control.  Essential hypertension History of hypertension blood pressure measured 100/62. He is on amlodipine, losartan and hydrochlorothiazide as well as metoprolol. Continue current meds at current dosing  Hyperlipidemia LDL goal <70 History of hyperlipidemia on statin therapy followed by his PCP      Lorretta Harp MD [facility], [facility] 05/17/2016 11:23 AM

## 2016-05-17 NOTE — Assessment & Plan Note (Signed)
History of 70% left renal artery stenosis found by angiography at the time of femoropopliteal bypass grafting. We have been following this by duplex ultrasound which has revealed this to be stable however his most recent Dopplers performed 04/19/16 revealed mild decrease in his left renal dimensions down from 10 cm measuring 9 cm. His renal aortic velocity has increased somewhat as well. We will continue to follow this noninvasively. Blood pressure is under good control.

## 2016-05-22 DIAGNOSIS — H47233 Glaucomatous optic atrophy, bilateral: Secondary | ICD-10-CM | POA: Diagnosis not present

## 2016-05-22 DIAGNOSIS — H353122 Nonexudative age-related macular degeneration, left eye, intermediate dry stage: Secondary | ICD-10-CM | POA: Diagnosis not present

## 2016-05-25 ENCOUNTER — Telehealth (HOSPITAL_COMMUNITY): Payer: Self-pay | Admitting: Radiology

## 2016-05-25 ENCOUNTER — Other Ambulatory Visit (HOSPITAL_COMMUNITY): Payer: Self-pay | Admitting: Interventional Radiology

## 2016-05-25 DIAGNOSIS — I771 Stricture of artery: Secondary | ICD-10-CM

## 2016-05-25 NOTE — Telephone Encounter (Signed)
Called pt's wife to schedule MRA. JM

## 2016-05-27 ENCOUNTER — Other Ambulatory Visit: Payer: Self-pay | Admitting: Cardiology

## 2016-05-29 NOTE — Telephone Encounter (Signed)
REFILL 

## 2016-06-01 ENCOUNTER — Ambulatory Visit (HOSPITAL_COMMUNITY)
Admission: RE | Admit: 2016-06-01 | Discharge: 2016-06-01 | Disposition: A | Discharge status: Home / Self Care | Payer: Medicare HMO | Source: Ambulatory Visit | Attending: Interventional Radiology | Admitting: Interventional Radiology

## 2016-06-01 DIAGNOSIS — I671 Cerebral aneurysm, nonruptured: Secondary | ICD-10-CM | POA: Insufficient documentation

## 2016-06-01 DIAGNOSIS — I6523 Occlusion and stenosis of bilateral carotid arteries: Secondary | ICD-10-CM | POA: Diagnosis not present

## 2016-06-01 DIAGNOSIS — I720 Aneurysm of carotid artery: Secondary | ICD-10-CM | POA: Diagnosis not present

## 2016-06-01 DIAGNOSIS — I771 Stricture of artery: Secondary | ICD-10-CM | POA: Diagnosis present

## 2016-06-06 ENCOUNTER — Telehealth (HOSPITAL_COMMUNITY): Payer: Self-pay

## 2016-06-06 NOTE — Telephone Encounter (Signed)
Left message for pt to return call. AW 

## 2016-06-15 DIAGNOSIS — H353134 Nonexudative age-related macular degeneration, bilateral, advanced atrophic with subfoveal involvement: Secondary | ICD-10-CM | POA: Diagnosis not present

## 2016-06-15 DIAGNOSIS — H353222 Exudative age-related macular degeneration, left eye, with inactive choroidal neovascularization: Secondary | ICD-10-CM | POA: Diagnosis not present

## 2016-06-15 DIAGNOSIS — H35363 Drusen (degenerative) of macula, bilateral: Secondary | ICD-10-CM | POA: Diagnosis not present

## 2016-06-15 DIAGNOSIS — H4720 Unspecified optic atrophy: Secondary | ICD-10-CM | POA: Diagnosis not present

## 2016-06-20 DIAGNOSIS — I10 Essential (primary) hypertension: Secondary | ICD-10-CM | POA: Diagnosis not present

## 2016-06-20 DIAGNOSIS — Z125 Encounter for screening for malignant neoplasm of prostate: Secondary | ICD-10-CM | POA: Diagnosis not present

## 2016-06-20 DIAGNOSIS — E559 Vitamin D deficiency, unspecified: Secondary | ICD-10-CM | POA: Diagnosis not present

## 2016-06-20 DIAGNOSIS — Z0000 Encounter for general adult medical examination without abnormal findings: Secondary | ICD-10-CM | POA: Diagnosis not present

## 2016-06-20 DIAGNOSIS — N390 Urinary tract infection, site not specified: Secondary | ICD-10-CM | POA: Diagnosis not present

## 2016-06-22 DIAGNOSIS — H353122 Nonexudative age-related macular degeneration, left eye, intermediate dry stage: Secondary | ICD-10-CM | POA: Diagnosis not present

## 2016-07-04 DIAGNOSIS — N390 Urinary tract infection, site not specified: Secondary | ICD-10-CM | POA: Diagnosis not present

## 2016-07-04 DIAGNOSIS — N182 Chronic kidney disease, stage 2 (mild): Secondary | ICD-10-CM | POA: Diagnosis not present

## 2016-07-04 DIAGNOSIS — Z0000 Encounter for general adult medical examination without abnormal findings: Secondary | ICD-10-CM | POA: Diagnosis not present

## 2016-07-04 DIAGNOSIS — I129 Hypertensive chronic kidney disease with stage 1 through stage 4 chronic kidney disease, or unspecified chronic kidney disease: Secondary | ICD-10-CM | POA: Diagnosis not present

## 2016-07-04 DIAGNOSIS — I739 Peripheral vascular disease, unspecified: Secondary | ICD-10-CM | POA: Diagnosis not present

## 2016-07-06 DIAGNOSIS — N35012 Post-traumatic membranous urethral stricture: Secondary | ICD-10-CM | POA: Diagnosis not present

## 2016-07-06 DIAGNOSIS — N3000 Acute cystitis without hematuria: Secondary | ICD-10-CM | POA: Diagnosis not present

## 2016-07-13 DIAGNOSIS — N35012 Post-traumatic membranous urethral stricture: Secondary | ICD-10-CM | POA: Diagnosis not present

## 2016-07-13 DIAGNOSIS — N3000 Acute cystitis without hematuria: Secondary | ICD-10-CM | POA: Diagnosis not present

## 2016-07-14 DIAGNOSIS — H353133 Nonexudative age-related macular degeneration, bilateral, advanced atrophic without subfoveal involvement: Secondary | ICD-10-CM | POA: Diagnosis not present

## 2016-07-14 DIAGNOSIS — H401121 Primary open-angle glaucoma, left eye, mild stage: Secondary | ICD-10-CM | POA: Diagnosis not present

## 2016-07-17 ENCOUNTER — Ambulatory Visit (HOSPITAL_COMMUNITY)
Admission: RE | Admit: 2016-07-17 | Discharge: 2016-07-17 | Disposition: A | Discharge status: Home / Self Care | Payer: Medicare HMO | Source: Ambulatory Visit | Attending: Internal Medicine | Admitting: Internal Medicine

## 2016-07-24 DIAGNOSIS — N35012 Post-traumatic membranous urethral stricture: Secondary | ICD-10-CM | POA: Diagnosis not present

## 2016-07-24 DIAGNOSIS — R3129 Other microscopic hematuria: Secondary | ICD-10-CM | POA: Diagnosis not present

## 2016-08-07 DIAGNOSIS — R69 Illness, unspecified: Secondary | ICD-10-CM | POA: Diagnosis not present

## 2016-08-14 DIAGNOSIS — M810 Age-related osteoporosis without current pathological fracture: Secondary | ICD-10-CM | POA: Diagnosis not present

## 2016-08-28 DIAGNOSIS — R350 Frequency of micturition: Secondary | ICD-10-CM | POA: Diagnosis not present

## 2016-08-28 DIAGNOSIS — N35012 Post-traumatic membranous urethral stricture: Secondary | ICD-10-CM | POA: Diagnosis not present

## 2016-09-11 DIAGNOSIS — H353122 Nonexudative age-related macular degeneration, left eye, intermediate dry stage: Secondary | ICD-10-CM | POA: Diagnosis not present

## 2016-09-15 DIAGNOSIS — R3129 Other microscopic hematuria: Secondary | ICD-10-CM | POA: Diagnosis not present

## 2016-09-22 DIAGNOSIS — N3000 Acute cystitis without hematuria: Secondary | ICD-10-CM | POA: Diagnosis not present

## 2016-09-22 DIAGNOSIS — N393 Stress incontinence (female) (male): Secondary | ICD-10-CM | POA: Diagnosis not present

## 2016-09-22 DIAGNOSIS — C61 Malignant neoplasm of prostate: Secondary | ICD-10-CM | POA: Diagnosis not present

## 2016-09-22 DIAGNOSIS — N35012 Post-traumatic membranous urethral stricture: Secondary | ICD-10-CM | POA: Diagnosis not present

## 2016-10-09 ENCOUNTER — Other Ambulatory Visit: Payer: Self-pay | Admitting: Cardiology

## 2016-11-07 DIAGNOSIS — I10 Essential (primary) hypertension: Secondary | ICD-10-CM | POA: Diagnosis not present

## 2016-11-07 DIAGNOSIS — Z23 Encounter for immunization: Secondary | ICD-10-CM | POA: Diagnosis not present

## 2016-11-07 DIAGNOSIS — E7800 Pure hypercholesterolemia, unspecified: Secondary | ICD-10-CM | POA: Diagnosis not present

## 2016-11-08 DIAGNOSIS — H353133 Nonexudative age-related macular degeneration, bilateral, advanced atrophic without subfoveal involvement: Secondary | ICD-10-CM | POA: Diagnosis not present

## 2016-11-08 DIAGNOSIS — H401121 Primary open-angle glaucoma, left eye, mild stage: Secondary | ICD-10-CM | POA: Diagnosis not present

## 2016-11-15 ENCOUNTER — Encounter (HOSPITAL_COMMUNITY): Payer: Medicare Other

## 2016-11-15 ENCOUNTER — Ambulatory Visit: Payer: Medicare Other | Admitting: Family

## 2016-11-16 ENCOUNTER — Encounter (HOSPITAL_COMMUNITY): Payer: Medicare HMO

## 2016-11-16 ENCOUNTER — Ambulatory Visit (HOSPITAL_COMMUNITY)
Admission: RE | Admit: 2016-11-16 | Discharge: 2016-11-16 | Disposition: A | Discharge status: Home / Self Care | Payer: Medicare HMO | Source: Ambulatory Visit | Attending: Cardiology | Admitting: Cardiology

## 2016-11-16 DIAGNOSIS — E785 Hyperlipidemia, unspecified: Secondary | ICD-10-CM | POA: Diagnosis not present

## 2016-11-16 DIAGNOSIS — I10 Essential (primary) hypertension: Secondary | ICD-10-CM | POA: Diagnosis not present

## 2016-11-16 DIAGNOSIS — I701 Atherosclerosis of renal artery: Secondary | ICD-10-CM | POA: Insufficient documentation

## 2016-11-16 DIAGNOSIS — I6523 Occlusion and stenosis of bilateral carotid arteries: Secondary | ICD-10-CM | POA: Diagnosis not present

## 2016-11-16 DIAGNOSIS — R9439 Abnormal result of other cardiovascular function study: Secondary | ICD-10-CM | POA: Insufficient documentation

## 2016-11-16 DIAGNOSIS — Z87891 Personal history of nicotine dependence: Secondary | ICD-10-CM | POA: Diagnosis not present

## 2016-11-20 ENCOUNTER — Encounter (HOSPITAL_COMMUNITY): Payer: Medicare HMO

## 2016-11-20 ENCOUNTER — Ambulatory Visit (HOSPITAL_COMMUNITY)
Admission: RE | Admit: 2016-11-20 | Discharge: 2016-11-20 | Disposition: A | Discharge status: Home / Self Care | Payer: Medicare HMO | Source: Ambulatory Visit | Attending: Cardiology | Admitting: Cardiology

## 2016-11-20 DIAGNOSIS — I70219 Atherosclerosis of native arteries of extremities with intermittent claudication, unspecified extremity: Secondary | ICD-10-CM | POA: Diagnosis not present

## 2016-11-20 DIAGNOSIS — Z87891 Personal history of nicotine dependence: Secondary | ICD-10-CM | POA: Diagnosis not present

## 2016-11-20 DIAGNOSIS — Z95828 Presence of other vascular implants and grafts: Secondary | ICD-10-CM

## 2016-11-27 ENCOUNTER — Telehealth: Payer: Self-pay | Admitting: Cardiovascular Disease

## 2016-11-27 NOTE — Telephone Encounter (Signed)
Called patient and LVM to call back to schedule followup this week.

## 2016-11-27 NOTE — Telephone Encounter (Signed)
Lance Dillon- spouse waiting for results of VAS US CAROTID, VAS US RENAL, VAS [place] ABI. Please review and call pt/wife, notified Dr Gwenlyn Found is not in the office until tomorrow

## 2016-11-27 NOTE — Telephone Encounter (Signed)
Probably needs to come in to discuss his renal Doppler studies.  His lower extremity ABIs are unchanged as are his carotid Dopplers.

## 2016-11-27 NOTE — Telephone Encounter (Signed)
Returning a call about test results . Please call the cell no#

## 2016-11-28 ENCOUNTER — Telehealth: Payer: Self-pay | Admitting: Cardiovascular Disease

## 2016-11-28 NOTE — Telephone Encounter (Signed)
Patient has been rescheduled for 11/2 with Dr. Gwenlyn Found.

## 2016-11-28 NOTE — Telephone Encounter (Signed)
Called the patient and LVM to call back to schedule his followup this week.

## 2016-11-28 NOTE — Telephone Encounter (Signed)
F/u message  Pt wife call requesting to speak with RN to get an appt for pt sooner than next available on 11/20 with only Dr. Gwenlyn Found. Please call back to discuss

## 2016-12-01 ENCOUNTER — Ambulatory Visit (INDEPENDENT_AMBULATORY_CARE_PROVIDER_SITE_OTHER): Payer: Medicare HMO | Admitting: Cardiovascular Disease

## 2016-12-01 ENCOUNTER — Encounter: Payer: Self-pay | Admitting: Cardiovascular Disease

## 2016-12-01 VITALS — BP 120/72 | HR 76 | Ht 68.0 in | Wt 167.4 lb

## 2016-12-01 DIAGNOSIS — E785 Hyperlipidemia, unspecified: Secondary | ICD-10-CM | POA: Diagnosis not present

## 2016-12-01 DIAGNOSIS — I739 Peripheral vascular disease, unspecified: Secondary | ICD-10-CM

## 2016-12-01 DIAGNOSIS — I10 Essential (primary) hypertension: Secondary | ICD-10-CM | POA: Diagnosis not present

## 2016-12-01 DIAGNOSIS — I701 Atherosclerosis of renal artery: Secondary | ICD-10-CM | POA: Diagnosis not present

## 2016-12-01 DIAGNOSIS — I6529 Occlusion and stenosis of unspecified carotid artery: Secondary | ICD-10-CM

## 2016-12-01 NOTE — Assessment & Plan Note (Signed)
History of 70% left renal artery stenosis demonstrated of the time of angiogram in 2013 which with the following by annual renal Doppler studies. This has shown a progressive decline in left renal dimensions now 7.5 cm down from 9 cm a year ago. His renal aortic ratio is 5.5 on that side. He does have mild renal insufficiency but his blood pressures are under good control. I do not think he has an indicaion for renal intervention

## 2016-12-01 NOTE — Assessment & Plan Note (Signed)
History of hyperlipidemia on statin therapy followed by his PCP 

## 2016-12-01 NOTE — Patient Instructions (Signed)
Medication Instructions: Your physician recommends that you continue on your current medications as directed. Please refer to the Current Medication list given to you today.  Labwork: I will request lab work from Dr. Doristine Devoid office.  Testing/Procedures: Your physician has requested that you have a carotid duplex. This test is an ultrasound of the carotid arteries in your neck. It looks at blood flow through these arteries that supply the brain with blood. Allow one hour for this exam. There are no restrictions or special instructions.  Your physician has requested that you have a renal artery duplex. During this test, an ultrasound is used to evaluate blood flow to the kidneys. Allow one hour for this exam. Do not eat after midnight the day before and avoid carbonated beverages. Take your medications as you usually do.  Your physician has requested that you have a lower extremity arterial duplex. During this test, ultrasound is used to evaluate arterial blood flow in the legs. Allow one hour for this exam. There are no restrictions or special instructions.  Your physician has requested that you have an ankle brachial index (ABI). During this test an ultrasound and blood pressure cuff are used to evaluate the arteries that supply the arms and legs with blood. Allow thirty minutes for this exam. There are no restrictions or special instructions.  (All in 1 year)   Follow-Up: Your physician wants you to follow-up in: 1 year with Dr. Gwenlyn Found after all dopplers have been completed. You will receive a reminder letter in the mail two months in advance. If you don't receive a letter, please call our office to schedule the follow-up appointment.  If you need a refill on your cardiac medications before your next appointment, please call your pharmacy.

## 2016-12-01 NOTE — Assessment & Plan Note (Signed)
History of peripheral arterial disease status post remote right femoropopliteal bypass graft by Dr. Kellie Simmering back in 2013. This has since closed. Lower extremity Dopplers performed 11/21/16 revealed a right ABI of 0.63 and a left ABI of .  69 notsignificantly changed from his previous studies.. He does complain of mild claudication but as does not seem to be lifestyle limiting

## 2016-12-01 NOTE — Assessment & Plan Note (Signed)
History of essential hypertension blood pressure measures 120/72. He is on amlodipine, losartan, hydrochlorothiazide and metoprolol. Continue current meds at current dosing

## 2016-12-01 NOTE — Progress Notes (Signed)
12/01/2016 Lance Dillon   [birth year]  [id number]  Primary Physician Thressa Sheller, MD Primary Cardiologist: Lorretta Harp MD [name], [place]  HPI:  Lance Dillon is a 76 y.o. male married Caucasian male formally a patient of Dr. Georgiann Mccoy in the past, then Dr. Ellyn Hack and currently I am care of his heart and peripheral vasculature. I last saw him in the office 05/17/16.He has no prior cardiac history. He does have a history of hypertension and hyperlipidemia. He had angiography performed by Dr. Trula Slade 06/20/11 the setting of right lower extremely claudication. At the time of angiography he had demonstrated 70% left renal artery stenosis. He ultimately underwent right femoropopliteal bypass grafting by Dr. Kellie Simmering. Following his renal Doppler studies semiannually which have demonstrated a mild progressive decline in renal dimensions as well as increase in velocities. His left renal dimension is still 10.9 cm. 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 His most recent renal Doppler studies performed 04/19/16 revealed a left renal aortic ratio of 6.64 with a left renal dimension that had decreased to 1 cm. He denies chest pain or shortness of breath since I saw him last. since I saw him back in April he has done well without symptoms. His renal Doppler showed its decline in his left renal dimension from 9 cm down to 7.5 cm. His renal function is stable in the mid 1 range. Blood pressure is under good control.   Current Meds  Medication Sig  . amLODipine (NORVASC) 10 MG tablet TAKE 1 TABLET (10 MG TOTAL) BY MOUTH AT BEDTIME.  Marland Kitchen aspirin 81 MG tablet Take 81 mg by mouth daily.  . Calcium Carbonate-Vitamin D (CALTRATE 600+D PO) Take 1 tablet by mouth daily.   . fluticasone (FLONASE) 50 MCG/ACT nasal spray Place 2 sprays into both nostrils as  needed (for nasal congestion).   Marland Kitchen latanoprost (XALATAN) 0.005 % ophthalmic solution Place 1 drop into the left eye at bedtime.   Marland Kitchen losartan-hydrochlorothiazide (HYZAAR) 100-12.5 MG tablet Take 1 tablet by mouth daily.  . LUTEIN PO Take 1 tablet by mouth daily.  . metoprolol succinate (TOPROL-XL) 25 MG 24 hr tablet Take 1 tablet (25 mg total) by mouth daily.  . Omega-3 Fatty Acids (FISH OIL) 1200 MG CAPS Take 3,600 mg by mouth daily.  . rosuvastatin (CRESTOR) 10 MG tablet TAKE 1 TABLET (10 MG TOTAL) BY MOUTH DAILY.  Marland Kitchen saccharomyces boulardii (FLORASTOR) 250 MG capsule Take 1 capsule (250 mg total) by mouth 2 (two) times daily. (Patient taking differently: Take 250 mg by mouth daily. )     Allergies  Allergen Reactions  . Avelox [Moxifloxacin Hcl In Nacl] Diarrhea    diarrhea  . Moxifloxacin Diarrhea    diarrhea    Social History   Social History  . Marital status: Married    Spouse name: N/A  . Number of children: N/A  . Years of education: N/A   Occupational History  . Boiler [employer]   Social History Main Topics  . Smoking status: Former Smoker    Packs/day: 3.00    Years: 30.00    Types: Cigarettes    Quit date: 07/01/2002  . Smokeless tobacco: Never Used  . Alcohol use No  . Drug use: No  . Sexual activity: Not on file   Other Topics Concern  . 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   Usually walks 2 miles maybe 2-3 days a week. Just not a cold weather.   He is a former smoker, quit "cold [place] "after 3 pack per day for 60 pack years in 2004     Review of Systems: General: negative for chills, fever, night sweats or weight changes.  Cardiovascular: negative for chest pain, dyspnea on exertion, edema, orthopnea, palpitations, paroxysmal nocturnal dyspnea or shortness of breath Dermatological: negative for rash Respiratory:  negative for cough or wheezing Urologic: negative for hematuria Abdominal: negative for nausea, vomiting, diarrhea, bright red blood per rectum, melena, or hematemesis Neurologic: negative for visual changes, syncope, or dizziness All other systems reviewed and are otherwise negative except as noted above.    Blood pressure 120/72, pulse 76, height 5\' 8"  (1.727 m), weight 167 lb 6.4 oz (75.9 kg).  General appearance: alert and no distress Neck: no adenopathy, no carotid bruit, no JVD, supple, symmetrical, trachea midline and thyroid not enlarged, symmetric, no tenderness/mass/nodules Lungs: clear to auscultation bilaterally Heart: regular rate and rhythm, S1, S2 normal, no murmur, click, rub or gallop Extremities: extremities normal, atraumatic, no cyanosis or edema Pulses: diminished pedal pulses Skin: Skin color, texture, turgor normal. No rashes or lesions Neurologic: Alert and oriented X 3, normal strength and tone. Normal symmetric reflexes. Normal coordination and gait  EKG not performed today  ASSESSMENT AND PLAN:   Atherosclerosis of native artery of extremity with intermittent claudication (HCC) History of peripheral arterial disease status post remote right femoropopliteal bypass graft by Dr. Kellie Simmering back in 2013. This has since closed. Lower extremity Dopplers performed 11/21/16 revealed a right ABI of 0.63 and a left ABI of .  69 notsignificantly changed from his previous studies.. He does complain of mild claudication but as does not seem to be lifestyle limiting  Left renal artery stenosis - 70% by angiography; increased velocity on recent Dopplers (60-99%) History of 70% left renal artery stenosis demonstrated of the time of angiogram in 2013 which with the following by annual renal Doppler studies. This has shown a progressive decline in left renal dimensions now 7.5 cm down from 9 cm a year ago. His renal aortic ratio is 5.5 on that side. He does have mild renal  insufficiency but his blood pressures are under good control. I do not think he has an indicaion for renal intervention  Essential hypertension History of essential hypertension blood pressure measures 120/72. He is on amlodipine, losartan, hydrochlorothiazide and metoprolol. Continue current meds at current dosing  Hyperlipidemia LDL goal <70 History of hyperlipidemia on statin therapy followed by his PCP.      Lorretta Harp MD FACP,FACC,FAHA, [facility] 12/01/2016 2:48 PM

## 2016-12-10 ENCOUNTER — Other Ambulatory Visit: Payer: Self-pay | Admitting: Cardiology

## 2016-12-11 NOTE — Telephone Encounter (Signed)
Rx has been sent to the pharmacy electronically. ° °

## 2016-12-27 DIAGNOSIS — I129 Hypertensive chronic kidney disease with stage 1 through stage 4 chronic kidney disease, or unspecified chronic kidney disease: Secondary | ICD-10-CM | POA: Diagnosis not present

## 2016-12-27 DIAGNOSIS — N390 Urinary tract infection, site not specified: Secondary | ICD-10-CM | POA: Diagnosis not present

## 2016-12-27 DIAGNOSIS — M810 Age-related osteoporosis without current pathological fracture: Secondary | ICD-10-CM | POA: Diagnosis not present

## 2016-12-27 DIAGNOSIS — E785 Hyperlipidemia, unspecified: Secondary | ICD-10-CM | POA: Diagnosis not present

## 2017-01-03 DIAGNOSIS — N390 Urinary tract infection, site not specified: Secondary | ICD-10-CM | POA: Diagnosis not present

## 2017-01-03 DIAGNOSIS — I10 Essential (primary) hypertension: Secondary | ICD-10-CM | POA: Diagnosis not present

## 2017-01-03 DIAGNOSIS — Z8546 Personal history of malignant neoplasm of prostate: Secondary | ICD-10-CM | POA: Diagnosis not present

## 2017-01-03 DIAGNOSIS — R319 Hematuria, unspecified: Secondary | ICD-10-CM | POA: Diagnosis not present

## 2017-01-03 DIAGNOSIS — N182 Chronic kidney disease, stage 2 (mild): Secondary | ICD-10-CM | POA: Diagnosis not present

## 2017-01-03 DIAGNOSIS — E7800 Pure hypercholesterolemia, unspecified: Secondary | ICD-10-CM | POA: Diagnosis not present

## 2017-01-03 DIAGNOSIS — E559 Vitamin D deficiency, unspecified: Secondary | ICD-10-CM | POA: Diagnosis not present

## 2017-01-15 ENCOUNTER — Other Ambulatory Visit: Payer: Self-pay | Admitting: Cardiovascular Disease

## 2017-01-17 DIAGNOSIS — N390 Urinary tract infection, site not specified: Secondary | ICD-10-CM | POA: Diagnosis not present

## 2017-01-17 NOTE — Telephone Encounter (Signed)
Rx request sent to pharmacy.  

## 2017-01-25 ENCOUNTER — Telehealth: Payer: Self-pay | Admitting: Cardiovascular Disease

## 2017-01-25 MED ORDER — ROSUVASTATIN CALCIUM 10 MG PO TABS: 10.0000 mg | ORAL_TABLET | Freq: Every day | ORAL | 11 refills | Status: DC

## 2017-01-25 NOTE — Telephone Encounter (Signed)
°*  STAT* If patient is at the pharmacy, call can be transferred to refill team.   1. Which medications need to be refilled? (please list name of each medication and dose if known) rosuvastatin (CRESTOR) 10 MG tablet  2. Which pharmacy/location (including street and city if local pharmacy) is medication to be sent to? [facility] [address] [phone]  3. Do they need a 30 day or 90 day supply?[place]

## 2017-01-30 HISTORY — PX: TRANSTHORACIC ECHOCARDIOGRAM: SHX275

## 2017-02-05 ENCOUNTER — Telehealth: Payer: Self-pay | Admitting: Cardiovascular Disease

## 2017-02-05 MED ORDER — ROSUVASTATIN CALCIUM 10 MG PO TABS: 10.0000 mg | ORAL_TABLET | Freq: Every day | ORAL | 10 refills | Status: DC

## 2017-02-05 NOTE — Telephone Encounter (Signed)
New message     Medication sent to wrong pharmacy.  See 01/25/17 Epic note   1. Which medications need to be refilled? (please list name of each medication and dose if known) rosuvastatin (CRESTOR) 10 MG tablet  2. Which pharmacy/location (including street and city if local pharmacy) is medication to be sent to? [facility] [address] [phone]  3. Do they need a 30 day or 90 day supply?[place]

## 2017-02-05 NOTE — Telephone Encounter (Signed)
Rx(s) sent to patient's preferred pharmacy electronically.

## 2017-02-07 DIAGNOSIS — R69 Illness, unspecified: Secondary | ICD-10-CM | POA: Diagnosis not present

## 2017-03-23 DIAGNOSIS — H353133 Nonexudative age-related macular degeneration, bilateral, advanced atrophic without subfoveal involvement: Secondary | ICD-10-CM | POA: Diagnosis not present

## 2017-03-23 DIAGNOSIS — H401121 Primary open-angle glaucoma, left eye, mild stage: Secondary | ICD-10-CM | POA: Diagnosis not present

## 2017-03-28 DIAGNOSIS — H4423 Degenerative myopia, bilateral: Secondary | ICD-10-CM | POA: Diagnosis not present

## 2017-03-28 DIAGNOSIS — H353222 Exudative age-related macular degeneration, left eye, with inactive choroidal neovascularization: Secondary | ICD-10-CM | POA: Diagnosis not present

## 2017-03-28 DIAGNOSIS — H353134 Nonexudative age-related macular degeneration, bilateral, advanced atrophic with subfoveal involvement: Secondary | ICD-10-CM | POA: Diagnosis not present

## 2017-03-28 DIAGNOSIS — H43813 Vitreous degeneration, bilateral: Secondary | ICD-10-CM | POA: Diagnosis not present

## 2017-04-04 DIAGNOSIS — C61 Malignant neoplasm of prostate: Secondary | ICD-10-CM | POA: Diagnosis not present

## 2017-04-04 DIAGNOSIS — N393 Stress incontinence (female) (male): Secondary | ICD-10-CM | POA: Diagnosis not present

## 2017-04-04 DIAGNOSIS — N35012 Post-traumatic membranous urethral stricture: Secondary | ICD-10-CM | POA: Diagnosis not present

## 2017-04-22 ENCOUNTER — Other Ambulatory Visit: Payer: Self-pay | Admitting: Cardiology

## 2017-05-31 ENCOUNTER — Other Ambulatory Visit (HOSPITAL_COMMUNITY): Payer: Self-pay | Admitting: Interventional Radiology

## 2017-05-31 ENCOUNTER — Telehealth (HOSPITAL_COMMUNITY): Payer: Self-pay

## 2017-05-31 DIAGNOSIS — I771 Stricture of artery: Secondary | ICD-10-CM

## 2017-05-31 DIAGNOSIS — I729 Aneurysm of unspecified site: Secondary | ICD-10-CM

## 2017-05-31 NOTE — Telephone Encounter (Signed)
Called to schedule f/u, no answer. AW 

## 2017-06-05 ENCOUNTER — Ambulatory Visit (HOSPITAL_COMMUNITY)
Admission: RE | Admit: 2017-06-05 | Discharge: 2017-06-05 | Disposition: A | Discharge status: Home / Self Care | Payer: Medicare HMO | Source: Ambulatory Visit | Attending: Interventional Radiology | Admitting: Interventional Radiology

## 2017-06-05 DIAGNOSIS — I671 Cerebral aneurysm, nonruptured: Secondary | ICD-10-CM | POA: Insufficient documentation

## 2017-06-05 DIAGNOSIS — I6523 Occlusion and stenosis of bilateral carotid arteries: Secondary | ICD-10-CM | POA: Diagnosis not present

## 2017-06-05 DIAGNOSIS — I729 Aneurysm of unspecified site: Secondary | ICD-10-CM

## 2017-06-05 DIAGNOSIS — I771 Stricture of artery: Secondary | ICD-10-CM

## 2017-06-14 ENCOUNTER — Other Ambulatory Visit: Payer: Self-pay | Admitting: Cardiology

## 2017-07-05 ENCOUNTER — Telehealth (HOSPITAL_COMMUNITY): Payer: Self-pay

## 2017-07-05 NOTE — Telephone Encounter (Signed)
Pt agreed to f/u in 1 year with mri/mra. AW  

## 2017-07-09 DIAGNOSIS — E785 Hyperlipidemia, unspecified: Secondary | ICD-10-CM | POA: Diagnosis not present

## 2017-07-09 DIAGNOSIS — I129 Hypertensive chronic kidney disease with stage 1 through stage 4 chronic kidney disease, or unspecified chronic kidney disease: Secondary | ICD-10-CM | POA: Diagnosis not present

## 2017-07-09 DIAGNOSIS — Z125 Encounter for screening for malignant neoplasm of prostate: Secondary | ICD-10-CM | POA: Diagnosis not present

## 2017-07-11 ENCOUNTER — Encounter: Payer: Self-pay | Admitting: Internal Medicine

## 2017-07-11 DIAGNOSIS — C61 Malignant neoplasm of prostate: Secondary | ICD-10-CM | POA: Diagnosis not present

## 2017-07-11 DIAGNOSIS — R32 Unspecified urinary incontinence: Secondary | ICD-10-CM | POA: Diagnosis not present

## 2017-07-11 DIAGNOSIS — J439 Emphysema, unspecified: Secondary | ICD-10-CM | POA: Diagnosis not present

## 2017-07-11 DIAGNOSIS — I10 Essential (primary) hypertension: Secondary | ICD-10-CM | POA: Diagnosis not present

## 2017-07-11 DIAGNOSIS — I779 Disorder of arteries and arterioles, unspecified: Secondary | ICD-10-CM | POA: Diagnosis not present

## 2017-07-11 DIAGNOSIS — E785 Hyperlipidemia, unspecified: Secondary | ICD-10-CM | POA: Diagnosis not present

## 2017-07-11 DIAGNOSIS — Z0000 Encounter for general adult medical examination without abnormal findings: Secondary | ICD-10-CM | POA: Diagnosis not present

## 2017-07-11 DIAGNOSIS — M810 Age-related osteoporosis without current pathological fracture: Secondary | ICD-10-CM | POA: Diagnosis not present

## 2017-07-11 DIAGNOSIS — I739 Peripheral vascular disease, unspecified: Secondary | ICD-10-CM | POA: Diagnosis not present

## 2017-07-11 DIAGNOSIS — R69 Illness, unspecified: Secondary | ICD-10-CM | POA: Diagnosis not present

## 2017-07-11 DIAGNOSIS — R0989 Other specified symptoms and signs involving the circulatory and respiratory systems: Secondary | ICD-10-CM | POA: Diagnosis not present

## 2017-07-19 ENCOUNTER — Ambulatory Visit: Payer: Medicare HMO | Admitting: Internal Medicine

## 2017-07-19 ENCOUNTER — Encounter: Payer: Self-pay | Admitting: Internal Medicine

## 2017-07-19 VITALS — BP 114/64 | HR 77 | Ht 68.0 in | Wt 167.0 lb

## 2017-07-19 DIAGNOSIS — R918 Other nonspecific abnormal finding of lung field: Secondary | ICD-10-CM | POA: Diagnosis not present

## 2017-07-19 DIAGNOSIS — J449 Chronic obstructive pulmonary disease, unspecified: Secondary | ICD-10-CM | POA: Diagnosis not present

## 2017-07-19 DIAGNOSIS — H353133 Nonexudative age-related macular degeneration, bilateral, advanced atrophic without subfoveal involvement: Secondary | ICD-10-CM | POA: Diagnosis not present

## 2017-07-19 DIAGNOSIS — H401121 Primary open-angle glaucoma, left eye, mild stage: Secondary | ICD-10-CM | POA: Diagnosis not present

## 2017-07-19 NOTE — Progress Notes (Signed)
Subjective:     Patient ID: Lance Dillon, male   DOB: [birth year],     MRN: [id number]  HPI  38 yowm quit smoking 2004 at prostate surgery with good breathing then referred to pulmonary clinic 07/19/2017 by Dr   Shelia Media for ? Copd    07/19/2017 1st [facility] office visit/ Mahamed Zalewski   Chief Complaint  Patient presents with  . Pulmonary Consult    Referred by Dr. Shelia Media for eval of abnormal lung sounds. Pt denies any respiratory co's.  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 No unusual exposure hx or h/o childhood pna/ asthma or knowledge of premature birth.  Sleeping  Ok flat without nocturnal  or early am exacerbation  of respiratory  c/o's or need for noct saba. Also denies any obvious fluctuation of symptoms with weather or environmental changes or other aggravating or alleviating factors except as outlined above   Current Allergies, Complete Past Medical History, Past Surgical History, Family History, and Social History were reviewed in [employer] record.  ROS  The following are not active complaints unless bolded Hoarseness, sore throat, dysphagia, dental problems, itching, sneezing,  nasal congestion or discharge of excess mucus or purulent secretions, ear ache,   fever, chills, sweats, unintended wt loss or wt gain, classically pleuritic or exertional cp,  orthopnea pnd or arm/hand swelling  or leg swelling, presyncope, palpitations, abdominal pain, anorexia, nausea, vomiting, diarrhea  or change in bowel habits or change in bladder habits, change in stools or change in urine, dysuria, hematuria,  rash, arthralgias, visual complaints, headache, numbness, weakness or ataxia or problems with  walking or coordination,  change in mood or  memory.        Current Meds  Medication Sig  . amLODipine (NORVASC) 10 MG tablet TAKE 1 TABLET BY MOUTH AT BEDTIME  . aspirin 81 MG tablet Take 81 mg by mouth daily.  . Calcium Carbonate-Vitamin D (CALTRATE 600+D PO) Take 1 tablet by mouth daily.   . fluticasone (FLONASE) 50 MCG/ACT nasal spray Place 2 sprays into both nostrils as needed (for nasal congestion).   Marland Kitchen latanoprost (XALATAN) 0.005 % ophthalmic solution Place 1 drop into the left eye at bedtime.   Marland Kitchen losartan-hydrochlorothiazide (HYZAAR) 100-12.5 MG tablet TAKE 1 TABLET BY MOUTH EVERY DAY  . LUTEIN PO Take 1 tablet by mouth daily.  . metoprolol succinate (TOPROL-XL) 25 MG 24 hr tablet TAKE 1 TABLET (25 MG TOTAL) BY MOUTH DAILY.  Marland Kitchen Omega-3 Fatty Acids (FISH OIL) 1200 MG CAPS Take 3,600 mg by mouth daily.  . rosuvastatin (CRESTOR) 10 MG tablet Take 1 tablet (10 mg total) by mouth daily.  Marland Kitchen saccharomyces boulardii (FLORASTOR) 250 MG capsule Take 1 capsule (250 mg total) by mouth 2 (two) times daily. (Patient taking differently: Take 250 mg by mouth daily. )            Review of Systems     Objective:   Physical Exam Very pleasant amb wm nad  Wt Readings from Last 3 Encounters:  07/19/17 167 lb (75.8 kg)  12/01/16 167 lb 6.4 oz (75.9 kg)  05/17/16 164 lb (74.4 kg)     Vital signs reviewed - Note  on arrival 02 sats  97% on RA   HEENT: nl dentition, turbinates bilaterally, and oropharynx. Nl external ear canals without cough reflex   NECK :  without JVD/Nodes/TM/ nl carotid upstrokes bilaterally   LUNGS: no acc muscle use,  Nl contour chest  Min crackles on deep insp in bases  Bilaterally L >R  without cough on insp or exp maneuvers   CV:  RRR  no s3 or murmur or increase in P2, and no edema   ABD:  soft and nontender with nl inspiratory excursion in the supine position. No bruits or organomegaly appreciated, bowel sounds nl  MS:  Nl gait/ ext warm without  deformities, calf tenderness, cyanosis  - No clubbing  No obvious joint restrictions   SKIN: warm and dry without lesions    NEURO:  alert, approp, nl sensorium with  no motor or cerebellar deficits apparent.          cxr report 07/12/17 ok per pt > requested  Assessment:

## 2017-07-19 NOTE — Patient Instructions (Signed)
Return here for any problem with breathing or cough  - follow up is as needed   I will send Dr Shelia Media a copy of my note once I look at your old records

## 2017-07-20 ENCOUNTER — Encounter: Payer: Self-pay | Admitting: Internal Medicine

## 2017-07-20 NOTE — Assessment & Plan Note (Signed)
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

## 2017-07-20 NOTE — Assessment & Plan Note (Addendum)
Quit smokng 2004 - Spirometry 07/19/2017  FEV1 1.68 (61%)  Ratio 65 with classic curvature    Presently Group A status= no symptoms, low risk exac    I reviewed the Fletcher curve with the patient and his wife that basically indicates  if you quit smoking when your best day FEV1 is   preserved (as is relatively still  the case here as it has not caused him any symptoms)  it is highly unlikely you will progress to severe disease and informed the patient there was  no medication on the market that has proven to alter the curve/ its downward trajectory  or the likelihood of progression of their disease(unlike other chronic medical conditions such as atheroclerosis where we do think we can change the natural hx with risk reducing meds)    Therefore stopping smoking and maintaining abstinence are  the most important aspects of care, not choice of inhalers or for that matter, doctors.   Treatment other than smoking cessation  is entirely directed by severity of symptoms and focused also on reducing exacerbations, not attempting to change the natural history of the disease.  Since no limiting sob or tendency to aecopd f/u can be prn , no maint pulmonary meds needed    Total time devoted to counseling  > 50 % of initial 60 min office visit:  review case with pt/wife discussion of options/alternatives/ personally creating written customized instructions  in presence of pt  then going over those specific  Instructions directly with the pt including how to use all of the meds but in particular covering each new medication in detail and the difference between the maintenance= "automatic" meds and the prns using an action plan format for the latter (If this problem/symptom => do that organization reading Left to right).  Please see AVS from this visit for a full list of these instructions which I personally wrote for this pt and  are unique to this visit.

## 2017-08-08 DIAGNOSIS — C61 Malignant neoplasm of prostate: Secondary | ICD-10-CM | POA: Diagnosis not present

## 2017-08-15 DIAGNOSIS — R69 Illness, unspecified: Secondary | ICD-10-CM | POA: Diagnosis not present

## 2017-08-20 DIAGNOSIS — M810 Age-related osteoporosis without current pathological fracture: Secondary | ICD-10-CM | POA: Diagnosis not present

## 2017-08-28 DIAGNOSIS — M5136 Other intervertebral disc degeneration, lumbar region: Secondary | ICD-10-CM | POA: Insufficient documentation

## 2017-08-28 DIAGNOSIS — M51369 Other intervertebral disc degeneration, lumbar region without mention of lumbar back pain or lower extremity pain: Secondary | ICD-10-CM | POA: Insufficient documentation

## 2017-09-24 ENCOUNTER — Other Ambulatory Visit: Payer: Self-pay | Admitting: Cardiovascular Disease

## 2017-09-24 DIAGNOSIS — I739 Peripheral vascular disease, unspecified: Secondary | ICD-10-CM

## 2017-09-24 DIAGNOSIS — I701 Atherosclerosis of renal artery: Secondary | ICD-10-CM

## 2017-09-24 DIAGNOSIS — Z95820 Peripheral vascular angioplasty status with implants and grafts: Secondary | ICD-10-CM

## 2017-09-25 DIAGNOSIS — M5136 Other intervertebral disc degeneration, lumbar region: Secondary | ICD-10-CM | POA: Diagnosis not present

## 2017-09-28 ENCOUNTER — Other Ambulatory Visit (HOSPITAL_COMMUNITY): Payer: Self-pay | Admitting: Urology

## 2017-09-28 DIAGNOSIS — C61 Malignant neoplasm of prostate: Secondary | ICD-10-CM

## 2017-10-18 DIAGNOSIS — R05 Cough: Secondary | ICD-10-CM | POA: Diagnosis not present

## 2017-10-18 DIAGNOSIS — Z23 Encounter for immunization: Secondary | ICD-10-CM | POA: Diagnosis not present

## 2017-10-18 DIAGNOSIS — J45909 Unspecified asthma, uncomplicated: Secondary | ICD-10-CM | POA: Diagnosis not present

## 2017-10-18 DIAGNOSIS — J441 Chronic obstructive pulmonary disease with (acute) exacerbation: Secondary | ICD-10-CM | POA: Diagnosis not present

## 2017-10-23 DIAGNOSIS — C61 Malignant neoplasm of prostate: Secondary | ICD-10-CM | POA: Diagnosis not present

## 2017-10-25 ENCOUNTER — Encounter (HOSPITAL_COMMUNITY)
Admission: RE | Admit: 2017-10-25 | Discharge: 2017-10-25 | Disposition: A | Discharge status: Home / Self Care | Payer: Medicare HMO | Source: Ambulatory Visit | Attending: Urology | Admitting: Urology

## 2017-10-25 DIAGNOSIS — C61 Malignant neoplasm of prostate: Secondary | ICD-10-CM | POA: Diagnosis not present

## 2017-10-25 MED ORDER — TECHNETIUM TC 99M MEDRONATE IV KIT
20.0000 | PACK | Freq: Once | INTRAVENOUS | Status: AC | PRN
  Administered 2017-10-25: 19.9 via INTRAVENOUS

## 2017-10-30 DIAGNOSIS — N393 Stress incontinence (female) (male): Secondary | ICD-10-CM | POA: Diagnosis not present

## 2017-10-30 DIAGNOSIS — C61 Malignant neoplasm of prostate: Secondary | ICD-10-CM | POA: Diagnosis not present

## 2017-11-01 DIAGNOSIS — R69 Illness, unspecified: Secondary | ICD-10-CM | POA: Diagnosis not present

## 2017-11-03 ENCOUNTER — Other Ambulatory Visit: Payer: Self-pay | Admitting: Cardiovascular Disease

## 2017-11-05 NOTE — Telephone Encounter (Signed)
Order Providers   Prescribing Provider Encounter Provider  Lorretta Harp, MD Lorretta Harp, MD  Outpatient Medication Detail    Disp Refills Start End   rosuvastatin (CRESTOR) 10 MG tablet 30 tablet 10 02/05/2017    Sig - Route: Take 1 tablet (10 mg total) by mouth daily. - Oral   Sent to pharmacy as: rosuvastatin (CRESTOR) 10 MG tablet   E-Prescribing Status: Receipt confirmed by pharmacy (02/05/2017 4:54 PM EST)   Pharmacy   [id number] - [place],  - [place]

## 2017-11-09 ENCOUNTER — Other Ambulatory Visit: Payer: Self-pay | Admitting: Cardiovascular Disease

## 2017-11-09 DIAGNOSIS — I739 Peripheral vascular disease, unspecified: Secondary | ICD-10-CM

## 2017-11-09 DIAGNOSIS — Z95820 Peripheral vascular angioplasty status with implants and grafts: Secondary | ICD-10-CM

## 2017-11-12 DIAGNOSIS — R69 Illness, unspecified: Secondary | ICD-10-CM | POA: Diagnosis not present

## 2017-11-20 ENCOUNTER — Other Ambulatory Visit: Payer: Self-pay | Admitting: Cardiology

## 2017-11-20 NOTE — Telephone Encounter (Signed)
Rx request sent to pharmacy.  

## 2017-11-21 ENCOUNTER — Ambulatory Visit (HOSPITAL_BASED_OUTPATIENT_CLINIC_OR_DEPARTMENT_OTHER)
Admission: RE | Admit: 2017-11-21 | Discharge: 2017-11-21 | Disposition: A | Discharge status: Home / Self Care | Payer: Medicare HMO | Source: Ambulatory Visit | Attending: Cardiovascular Disease | Admitting: Cardiovascular Disease

## 2017-11-21 ENCOUNTER — Ambulatory Visit (HOSPITAL_COMMUNITY)
Admission: RE | Admit: 2017-11-21 | Discharge: 2017-11-21 | Disposition: A | Discharge status: Home / Self Care | Payer: Medicare HMO | Source: Ambulatory Visit | Attending: Internal Medicine | Admitting: Internal Medicine

## 2017-11-21 ENCOUNTER — Other Ambulatory Visit: Payer: Self-pay | Admitting: Cardiovascular Disease

## 2017-11-21 DIAGNOSIS — I6529 Occlusion and stenosis of unspecified carotid artery: Secondary | ICD-10-CM

## 2017-11-21 DIAGNOSIS — I739 Peripheral vascular disease, unspecified: Secondary | ICD-10-CM

## 2017-11-21 DIAGNOSIS — I701 Atherosclerosis of renal artery: Secondary | ICD-10-CM

## 2017-11-21 DIAGNOSIS — Z95820 Peripheral vascular angioplasty status with implants and grafts: Secondary | ICD-10-CM

## 2017-11-22 DIAGNOSIS — H353133 Nonexudative age-related macular degeneration, bilateral, advanced atrophic without subfoveal involvement: Secondary | ICD-10-CM | POA: Diagnosis not present

## 2017-11-22 DIAGNOSIS — H401121 Primary open-angle glaucoma, left eye, mild stage: Secondary | ICD-10-CM | POA: Diagnosis not present

## 2017-11-26 ENCOUNTER — Encounter: Payer: Self-pay | Admitting: Internal Medicine

## 2017-11-27 ENCOUNTER — Other Ambulatory Visit: Payer: Self-pay | Admitting: *Deleted

## 2017-11-27 DIAGNOSIS — I6529 Occlusion and stenosis of unspecified carotid artery: Secondary | ICD-10-CM

## 2017-11-27 DIAGNOSIS — I739 Peripheral vascular disease, unspecified: Secondary | ICD-10-CM

## 2017-12-05 DIAGNOSIS — J45909 Unspecified asthma, uncomplicated: Secondary | ICD-10-CM | POA: Diagnosis not present

## 2017-12-11 DIAGNOSIS — J439 Emphysema, unspecified: Secondary | ICD-10-CM | POA: Diagnosis not present

## 2017-12-11 DIAGNOSIS — R0989 Other specified symptoms and signs involving the circulatory and respiratory systems: Secondary | ICD-10-CM | POA: Diagnosis not present

## 2017-12-11 DIAGNOSIS — R05 Cough: Secondary | ICD-10-CM | POA: Diagnosis not present

## 2017-12-13 DIAGNOSIS — J439 Emphysema, unspecified: Secondary | ICD-10-CM | POA: Diagnosis not present

## 2017-12-26 ENCOUNTER — Ambulatory Visit: Payer: Medicare HMO | Admitting: Cardiovascular Disease

## 2017-12-26 ENCOUNTER — Encounter: Payer: Self-pay | Admitting: Cardiovascular Disease

## 2017-12-26 VITALS — BP 124/72 | HR 73 | Ht 68.0 in | Wt 165.0 lb

## 2017-12-26 DIAGNOSIS — H4423 Degenerative myopia, bilateral: Secondary | ICD-10-CM | POA: Diagnosis not present

## 2017-12-26 DIAGNOSIS — I701 Atherosclerosis of renal artery: Secondary | ICD-10-CM | POA: Diagnosis not present

## 2017-12-26 DIAGNOSIS — H353222 Exudative age-related macular degeneration, left eye, with inactive choroidal neovascularization: Secondary | ICD-10-CM | POA: Diagnosis not present

## 2017-12-26 DIAGNOSIS — I70213 Atherosclerosis of native arteries of extremities with intermittent claudication, bilateral legs: Secondary | ICD-10-CM | POA: Diagnosis not present

## 2017-12-26 DIAGNOSIS — H43813 Vitreous degeneration, bilateral: Secondary | ICD-10-CM | POA: Diagnosis not present

## 2017-12-26 DIAGNOSIS — H353134 Nonexudative age-related macular degeneration, bilateral, advanced atrophic with subfoveal involvement: Secondary | ICD-10-CM | POA: Diagnosis not present

## 2017-12-26 NOTE — Progress Notes (Signed)
12/26/2017 Lance Dillon   [birth year]  [id number]  Primary Physician Lance Pretty, MD Primary Cardiologist: Lance Harp MD Lance Dillon, [place] married Caucasian male formally a patient of Dr. Georgiann Dillon in the past, then Dr. Ellyn Dillon and currently I am care of his heart and peripheral vasculature. I last saw him in the office 12/01/2016.He has no prior cardiac history. He does have a history of hypertension and hyperlipidemia. He had angiography performed by Dr. Trula Dillon 06/20/11 the setting of right lower extremely claudication. At the time of angiography he had demonstrated 70% left renal artery stenosis. He ultimately underwent right femoropopliteal bypass grafting by Dr. Kellie Dillon. Following his renal Doppler studies semiannually which have demonstrated a mild progressive decline in renal dimensions as well as increase in velocities. His left renal dimension is still 10.9 cm. 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 His most recent renal Doppler studies performed 04/19/16 revealed a left renal aortic ratio of 6.64 with a left renal dimension that had decreased to 1 cm.He denies chest pain or shortness of breath since I saw him last.   Since I saw him a year ago he is remained stable.  His renal Dopplers have remained stable as well with a smaller left kidney compared to right.  Carotid Dopplers reveal an occluded right carotid with no evidence of left ICA stenosis.  He denies chest pain or shortness of breath.  HPI:  Lance Dillon is a 77 y.o.     Current Meds  Medication Sig  . amLODipine (NORVASC) 10 MG tablet Take 1 tablet (10 mg total) by mouth daily. Please schedule appointment for refills.  Marland Kitchen aspirin 81 MG tablet Take 81 mg by mouth daily.  . Calcium Carbonate-Vitamin D (CALTRATE 600+D PO) Take 1 tablet by mouth daily.   .  fluticasone (FLONASE) 50 MCG/ACT nasal spray Place 2 sprays into both nostrils as needed (for nasal congestion).   Marland Kitchen latanoprost (XALATAN) 0.005 % ophthalmic solution Place 1 drop into the left eye at bedtime.   Marland Kitchen losartan-hydrochlorothiazide (HYZAAR) 100-12.5 MG tablet TAKE 1 TABLET BY MOUTH EVERY DAY  . LUTEIN PO Take 1 tablet by mouth daily.  . metoprolol succinate (TOPROL-XL) 25 MG 24 hr tablet TAKE 1 TABLET (25 MG TOTAL) BY MOUTH DAILY.  Marland Kitchen Omega-3 Fatty Acids (FISH OIL) 1200 MG CAPS Take 3,600 mg by mouth daily.  . rosuvastatin (CRESTOR) 10 MG tablet Take 1 tablet (10 mg total) by mouth daily.  Marland Kitchen saccharomyces boulardii (FLORASTOR) 250 MG capsule Take 1 capsule (250 mg total) by mouth 2 (two) times daily. (Patient taking differently: Take 250 mg by mouth daily. )     Allergies  Allergen Reactions  . Avelox [Moxifloxacin Hcl In Nacl] Diarrhea    diarrhea  . Moxifloxacin Diarrhea    diarrhea    Social History   Socioeconomic History  . Marital status: Married    Spouse name: Not on file  . Number of children: Not on file  . Years of education: Not on file  . Highest education level: Not on file  Occupational History  . Occupation: Therapist, occupational: [employer]  Social Needs  . Financial resource strain: Not on file  . Food insecurity:    Worry: Not on file    Inability: Not on file  . Transportation needs:  Medical: Not on file    Non-medical: Not on file  Tobacco Use  . Smoking status: Former Smoker    Packs/day: 3.00    Years: 30.00    Pack years: 90.00    Types: Cigarettes    Last attempt to quit: 07/01/2002    Years since quitting: 15.4  . Smokeless tobacco: Never Used  Substance and Sexual Activity  . Alcohol use: No  . Drug use: No  . Sexual activity: Not on file  Lifestyle  . Physical activity:    Days per week: Not on file    Minutes per session: Not on file  . Stress: Not on file  Relationships  . Social connections:    Talks on  phone: Not on file    Gets together: Not on file    Attends religious service: Not on file    Active member of club or organization: Not on file    Attends meetings of clubs or organizations: Not on file    Relationship status: Not on file  . Intimate partner violence:    Fear of current or ex partner: Not on file    Emotionally abused: Not on file    Physically abused: Not on file    Forced sexual activity: Not on file  Other Topics Concern  . 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   Usually walks 2 miles maybe 2-3 days a week. Just not a cold weather.   He is a former smoker, quit "cold [place] "after 3 pack per day for 60 pack years in 2004     Review of Systems: General: negative for chills, fever, night sweats or weight changes.  Cardiovascular: negative for chest pain, dyspnea on exertion, edema, orthopnea, palpitations, paroxysmal nocturnal dyspnea or shortness of breath Dermatological: negative for rash Respiratory: negative for cough or wheezing Urologic: negative for hematuria Abdominal: negative for nausea, vomiting, diarrhea, bright red blood per rectum, melena, or hematemesis Neurologic: negative for visual changes, syncope, or dizziness All other systems reviewed and are otherwise negative except as noted above.    Blood pressure 124/72, pulse 73, height 5\' 8"  (1.727 m), weight 165 lb (74.8 kg).  General appearance: alert and no distress Neck: no adenopathy, no carotid bruit, no JVD, supple, symmetrical, trachea midline and thyroid not enlarged, symmetric, no tenderness/mass/nodules Lungs: clear to auscultation bilaterally Heart: regular rate and rhythm, S1, S2 normal, no murmur, click, rub or gallop Extremities: extremities normal, atraumatic, no cyanosis or edema Pulses: 2+ and symmetric Skin: Skin color, texture, turgor normal. No rashes or  lesions Neurologic: Alert and oriented X 3, normal strength and tone. Normal symmetric reflexes. Normal coordination and gait  EKG sinus rhythm at 73 with sinus arrhythmia.  Personally reviewed this EKG.  ASSESSMENT AND PLAN:   Atherosclerosis of native artery of extremity with intermittent claudication (HCC) History of peripheral arterial disease status post angiography performed by Dr. Trula Dillon 06/20/2011 in the setting of lower extremity claudication.  He ultimately underwent right femoropopliteal bypass grafting by Dr. Kellie Dillon.  He really denies claudication.  We will follow-up lower extremity arterial Doppler studies.  Left renal artery stenosis - 70% by angiography; increased velocity on recent Dopplers (60-99%) History of 70% left renal artery stenosis by angiography performed Dr. Trula Dillon in 2013.  We have been following his 11/22/2017 reveals stable velocities and renal dimensions.  His left kidney significantly smaller  than the right kidney which has been a stable finding.  Essential hypertension History of essential hypertension blood pressure measured today at 124/72.  He is on amlodipine, losartan, hydrochlorothiazide and metoprolol.  Hyperlipidemia LDL goal <70 History of hyperlipidemia on statin therapy with lipid profile performed 07/09/2017 revealing total cholesterol 119, LDL 49 and HDL 50.      Lance Harp MD FACP,FACC,FAHA, [facility] 12/26/2017 11:41 AM

## 2017-12-26 NOTE — Assessment & Plan Note (Signed)
History of hyperlipidemia on statin therapy with lipid profile performed 07/09/2017 revealing total cholesterol 119, LDL 49 and HDL 50.

## 2017-12-26 NOTE — Assessment & Plan Note (Signed)
History of peripheral arterial disease status post angiography performed by Dr. Trula Slade 06/20/2011 in the setting of lower extremity claudication.  He ultimately underwent right femoropopliteal bypass grafting by Dr. Kellie Simmering.  He really denies claudication.  We will follow-up lower extremity arterial Doppler studies.

## 2017-12-26 NOTE — Patient Instructions (Signed)
Medication Instructions:  NONE If you need a refill on your cardiac medications before your next appointment, please call your pharmacy.   Lab work: NONE If you have labs (blood work) drawn today and your tests are completely normal, you will receive your results only by: Marland Kitchen MyChart Message (if you have MyChart) OR . A paper copy in the mail If you have any lab test that is abnormal or we need to change your treatment, we will call you to review the results.  Testing/Procedures: Your physician has requested that you have a renal artery duplex. During this test, an ultrasound is used to evaluate blood flow to the kidneys. Allow one hour for this exam. Do not eat after midnight the day before and avoid carbonated beverages. Take your medications as you usually do.  SCHEDULE FOR October 2020  Your physician has requested that you have a carotid duplex. This test is an ultrasound of the carotid arteries in your neck. It looks at blood flow through these arteries that supply the brain with blood. Allow one hour for this exam. There are no restrictions or special instructions.  October 2020 AS SCHEDULED  Your physician has requested that you have a lower extremity arterial duplex. This test is an ultrasound of the arteries in the legs or arms. It looks at arterial blood flow in the legs and arms. Allow one hour for Lower and Upper Arterial scans. There are no restrictions or special instructions   October 2020 AS SCHEDULED  Follow-Up: At [facility], you and your health needs are our priority.  As part of our continuing mission to provide you with exceptional heart care, we have created designated Provider Care Teams.  These Care Teams include your primary Cardiologist (physician) and Advanced Practice Providers (APPs -  Physician Assistants and Nurse Practitioners) who all work together to provide you with the care you need, when you need it. You will need a follow up appointment in 12 months.   Please call our office 2 months in advance to schedule this appointment.  You may see  or one of the following Advanced Practice Providers on your designated Care Team:   Kerin Ransom, PA-C Roby Lofts, [place] . Sande Rives, PA-C

## 2017-12-26 NOTE — Assessment & Plan Note (Signed)
History of essential hypertension blood pressure measured today at 124/72.  He is on amlodipine, losartan, hydrochlorothiazide and metoprolol.

## 2017-12-26 NOTE — Assessment & Plan Note (Signed)
History of 70% left renal artery stenosis by angiography performed Dr. Trula Slade in 2013.  We have been following his 11/22/2017 reveals stable velocities and renal dimensions.  His left kidney significantly smaller than the right kidney which has been a stable finding.

## 2017-12-28 ENCOUNTER — Encounter: Payer: Self-pay | Admitting: Cardiovascular Disease

## 2017-12-31 ENCOUNTER — Encounter: Payer: Self-pay | Admitting: Internal Medicine

## 2017-12-31 ENCOUNTER — Ambulatory Visit (AMBULATORY_SURGERY_CENTER): Payer: Medicare HMO | Admitting: *Deleted

## 2017-12-31 VITALS — Ht 68.0 in | Wt 168.0 lb

## 2017-12-31 DIAGNOSIS — Z86010 Personal history of colonic polyps: Secondary | ICD-10-CM

## 2017-12-31 NOTE — Progress Notes (Signed)
Patient denies any allergies to eggs or soy. Patient denies any problems with anesthesia/sedation. Patient denies any oxygen use at home. Patient denies taking any diet/weight loss medications or blood thinners. EMMI education offered, pt declined.  

## 2018-01-12 ENCOUNTER — Other Ambulatory Visit: Payer: Self-pay | Admitting: Cardiology

## 2018-01-12 DIAGNOSIS — J439 Emphysema, unspecified: Secondary | ICD-10-CM | POA: Diagnosis not present

## 2018-01-14 ENCOUNTER — Encounter: Payer: Self-pay | Admitting: Internal Medicine

## 2018-01-14 ENCOUNTER — Ambulatory Visit (AMBULATORY_SURGERY_CENTER): Payer: Medicare HMO | Admitting: Internal Medicine

## 2018-01-14 VITALS — BP 103/60 | HR 71 | Temp 96.6°F | Resp 11 | Ht 68.0 in | Wt 165.0 lb

## 2018-01-14 DIAGNOSIS — D124 Benign neoplasm of descending colon: Secondary | ICD-10-CM

## 2018-01-14 DIAGNOSIS — Z86010 Personal history of colonic polyps: Secondary | ICD-10-CM | POA: Diagnosis not present

## 2018-01-14 DIAGNOSIS — I739 Peripheral vascular disease, unspecified: Secondary | ICD-10-CM | POA: Diagnosis not present

## 2018-01-14 DIAGNOSIS — D649 Anemia, unspecified: Secondary | ICD-10-CM | POA: Diagnosis not present

## 2018-01-14 MED ORDER — SODIUM CHLORIDE 0.9 % IV SOLN: 500.0000 mL | INTRAVENOUS | Status: DC

## 2018-01-14 NOTE — Patient Instructions (Addendum)
I found and removed one tiny polyp.  I will let you know pathology results by mail and/or My Chart.  You do not need any more routine repeat colonoscopy testing. We would investigate abnormal signs or symptoms if they occur, however (hope not).  I appreciate the opportunity to care for you. Lance Mayer, MD, [facility]  Polyp handout given to patient.  YOU HAD AN ENDOSCOPIC PROCEDURE TODAY AT [facility]:   Refer to the procedure report that was given to you for any specific questions about what was found during the examination.  If the procedure report does not answer your questions, please call your gastroenterologist to clarify.  If you requested that your care partner not be given the details of your procedure findings, then the procedure report has been included in a sealed envelope for you to review at your convenience later.  YOU SHOULD EXPECT: Some feelings of bloating in the abdomen. Passage of more gas than usual.  Walking can help get rid of the air that was put into your GI tract during the procedure and reduce the bloating. If you had a lower endoscopy (such as a colonoscopy or flexible sigmoidoscopy) you may notice spotting of blood in your stool or on the toilet paper. If you underwent a bowel prep for your procedure, you may not have a normal bowel movement for a few days.  Please Note:  You might notice some irritation and congestion in your nose or some drainage.  This is from the oxygen used during your procedure.  There is no need for concern and it should clear up in a day or so.  SYMPTOMS TO REPORT IMMEDIATELY:   Following lower endoscopy (colonoscopy or flexible sigmoidoscopy):  Excessive amounts of blood in the stool  Significant tenderness or worsening of abdominal pains  Swelling of the abdomen that is new, acute  Fever of 100F or higher  For urgent or emergent issues, a gastroenterologist can be reached at any hour by calling [phone]  [phone].   DIET:  We do recommend a small meal at first, but then you may proceed to your regular diet.  Drink plenty of fluids but you should avoid alcoholic beverages for 24 hours.  ACTIVITY:  You should plan to take it easy for the rest of today and you should NOT DRIVE or use heavy machinery until tomorrow (because of the sedation medicines used during the test).    FOLLOW UP: Our staff will call the number listed on your records the next business day following your procedure to check on you and address any questions or concerns that you may have regarding the information given to you following your procedure. If we do not reach you, we will leave a message.  However, if you are feeling well and you are not experiencing any problems, there is no need to return our call.  We will assume that you have returned to your regular daily activities without incident.  If any biopsies were taken you will be contacted by phone or by letter within the next 1-3 weeks.  Please call us at [phone] if you have not heard about the biopsies in 3 weeks.    SIGNATURES/CONFIDENTIALITY: You and/or your care partner have signed paperwork which will be entered into your electronic medical record.  These signatures attest to the fact that that the information above on your After Visit Summary has been reviewed and is understood.  Full responsibility of the confidentiality of  this discharge information lies with you and/or your care-partner.

## 2018-01-14 NOTE — Op Note (Signed)
[place] Patient Name: Lance Dillon Procedure Date: 01/14/2018 8:27 AM MRN: [id number] Endoscopist: [name] , MD Age: 77 Referring MD:  Date of Birth: 05/27/1940 Gender: Male Account #: [id number] Procedure:                Colonoscopy Indications:              Surveillance: Personal history of adenomatous                            polyps on last colonoscopy 5 years ago Medicines:                Propofol per Anesthesia, Monitored Anesthesia Care Procedure:                Pre-Anesthesia Assessment:                           - Prior to the procedure, a History and Physical                            was performed, and patient medications and                            allergies were reviewed. The patient's tolerance of                            previous anesthesia was also reviewed. The risks                            and benefits of the procedure and the sedation                            options and risks were discussed with the patient.                            All questions were answered, and informed consent                            was obtained. Prior Anticoagulants: The patient has                            taken no previous anticoagulant or antiplatelet                            agents. ASA Grade Assessment: III - A patient with                            severe systemic disease. After reviewing the risks                            and benefits, the patient was deemed in                            satisfactory condition to undergo the procedure.  After obtaining informed consent, the colonoscope                            was passed under direct vision. Throughout the                            procedure, the patient's blood pressure, pulse, and                            oxygen saturations were monitored continuously. The                            Colonoscope was introduced through the anus and   advanced to the the cecum, identified by                            appendiceal orifice and ileocecal valve. The                            colonoscopy was performed without difficulty. The                            patient tolerated the procedure well. The quality                            of the bowel preparation was good. The ileocecal                            valve, appendiceal orifice, and rectum were                            photographed. Scope In: 8:38:12 AM Scope Out: 8:50:37 AM Scope Withdrawal Time: 0 hours 9 minutes 2 seconds  Total Procedure Duration: 0 hours 12 minutes 25 seconds  Findings:                 The perianal examination was normal.                           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                           A 3 mm polyp was found in the descending colon. The                            polyp was sessile. The polyp was removed with a                            cold snare. Resection and retrieval were complete.                            Verification of patient identification for the  specimen was done. Estimated blood loss was minimal.                           The exam was otherwise without abnormality on                            direct and retroflexion views. Complications:            No immediate complications. Estimated Blood Loss:     Estimated blood loss was minimal. 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                           - One 3 mm polyp in the descending colon, removed                            with a cold snare. Resected and retrieved.                           - The examination was otherwise normal on direct                            and retroflexion views. Recommendation:           - Patient has a contact number available for                            emergencies.  The signs and symptoms of potential                            delayed complications were discussed with the                            patient. Return to normal activities tomorrow.                            Written discharge instructions were provided to the                            patient.                           - Resume previous diet.                           - Continue present medications.                           - No repeat colonoscopy due to age. [name], MD 01/14/2018 8:57:34 AM This report has been signed electronically.

## 2018-01-14 NOTE — Progress Notes (Signed)
Pt's states no medical or surgical changes since previsit or office visit. 

## 2018-01-14 NOTE — Progress Notes (Signed)
Called to room to assist during endoscopic procedure.  Patient ID and intended procedure confirmed with present staff. Received instructions for my participation in the procedure from the performing physician.  

## 2018-01-14 NOTE — Progress Notes (Signed)
Report given to PACU, vss 

## 2018-01-15 ENCOUNTER — Telehealth: Payer: Self-pay | Admitting: *Deleted

## 2018-01-15 ENCOUNTER — Telehealth: Payer: Self-pay

## 2018-01-15 NOTE — Telephone Encounter (Signed)
Phone number did not allow for a message.

## 2018-01-15 NOTE — Telephone Encounter (Signed)
  Follow up Call-  Call back number 01/14/2018  Post procedure Call Back phone  # [phone] hm  Permission to leave phone message Yes  Some recent data might be hidden     Patient questions:  Phone just kept ringing and ringing.

## 2018-01-18 ENCOUNTER — Encounter: Payer: Self-pay | Admitting: Internal Medicine

## 2018-01-18 NOTE — Progress Notes (Signed)
Diminutive adenoma no recall - age My Chart

## 2018-01-19 ENCOUNTER — Other Ambulatory Visit: Payer: Self-pay | Admitting: Cardiovascular Disease

## 2018-01-29 ENCOUNTER — Encounter: Payer: Self-pay | Admitting: *Deleted

## 2018-01-31 DIAGNOSIS — J441 Chronic obstructive pulmonary disease with (acute) exacerbation: Secondary | ICD-10-CM | POA: Diagnosis not present

## 2018-01-31 DIAGNOSIS — H6123 Impacted cerumen, bilateral: Secondary | ICD-10-CM | POA: Diagnosis not present

## 2018-01-31 DIAGNOSIS — J4541 Moderate persistent asthma with (acute) exacerbation: Secondary | ICD-10-CM | POA: Diagnosis not present

## 2018-02-05 DIAGNOSIS — C61 Malignant neoplasm of prostate: Secondary | ICD-10-CM | POA: Diagnosis not present

## 2018-02-05 NOTE — Telephone Encounter (Signed)
Error

## 2018-02-12 ENCOUNTER — Encounter: Payer: Self-pay | Admitting: Internal Medicine

## 2018-02-12 ENCOUNTER — Other Ambulatory Visit: Payer: Self-pay | Admitting: Internal Medicine

## 2018-02-12 ENCOUNTER — Ambulatory Visit: Payer: Medicare HMO | Admitting: Internal Medicine

## 2018-02-12 VITALS — BP 120/68 | HR 75 | Ht 68.0 in | Wt 167.8 lb

## 2018-02-12 DIAGNOSIS — J449 Chronic obstructive pulmonary disease, unspecified: Secondary | ICD-10-CM

## 2018-02-12 DIAGNOSIS — J439 Emphysema, unspecified: Secondary | ICD-10-CM | POA: Diagnosis not present

## 2018-02-12 MED ORDER — MOMETASONE FURO-FORMOTEROL FUM 100-5 MCG/ACT IN AERO: INHALATION_SPRAY | RESPIRATORY_TRACT | 11 refills | Status: DC

## 2018-02-12 NOTE — Progress Notes (Signed)
Subjective:     Patient ID: Lance Dillon, male   DOB: August 27, 1940,     MRN: [id number]    Brief patient profile:  51 yowm quit smoking 2004 at prostate surgery with good breathing then referred to pulmonary clinic 07/19/2017 by Dr   Shelia Media for ? Copd    History of Present Illness  07/19/2017 1st [facility] office visit/ Lesette Frary   Chief Complaint  Patient presents with  . Pulmonary Consult    Referred by Dr. Shelia Media for eval of abnormal lung sounds. Pt denies any respiratory co's.  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 Acute Visit    Pt c/o cough since September 2019. He occ prod some clear sputum.   Dyspnea:  Not limited by breathing from desired activities  Including yardwork Cough: at hs  But not all night and then after stirs, better on tessalon Sleeping: better R side down/ bed flat  SABA use: none on dulera 200 2bid but hfa poor and ? dulera makes cough worse?  02: none    No obvious day to day or daytime variability or assoc excess/ purulent sputum or mucus plugs or hemoptysis or cp or chest tightness, subjective wheeze or overt sinus or hb symptoms.   Also denies any obvious fluctuation of symptoms with weather or environmental changes or other aggravating or alleviating factors except as outlined above   No unusual exposure hx or h/o childhood pna/ asthma or knowledge of premature birth.  Current Allergies, Complete Past Medical History, Past Surgical History, Family History, and Social History were reviewed in [employer] record.  ROS  The following are not active complaints unless bolded Hoarseness, sore throat, dysphagia, dental problems, itching, sneezing,  nasal  congestion or discharge of excess mucus or purulent secretions, ear ache,   fever, chills, sweats, unintended wt loss or wt gain, classically pleuritic or exertional cp,  orthopnea pnd or arm/hand swelling  or leg swelling, presyncope, palpitations, abdominal pain, anorexia, nausea, vomiting, diarrhea  or change in bowel habits or change in bladder habits, change in stools or change in urine, dysuria, hematuria,  rash, arthralgias, visual complaints, headache, numbness, weakness or ataxia or problems with walking or coordination,  change in mood or  memory.        Current Meds  Medication Sig  . acetaminophen (TYLENOL) 500 MG tablet Take 500 mg by mouth every 6 (six) hours as needed.  Marland Kitchen amLODipine (NORVASC) 10 MG tablet Take 1 tablet (10 mg total) by mouth daily. Please schedule appointment for refills.  Marland Kitchen aspirin 81 MG tablet Take 81 mg by mouth daily.  . Calcium Carbonate-Vitamin D (CALTRATE 600+D PO) Take 1 tablet by mouth daily.   . Cyanocobalamin (VITAMIN B 12 PO) Take 2,500 mg by mouth daily.  . fluticasone (FLONASE) 50 MCG/ACT nasal spray Place 2 sprays into both nostrils as needed (for nasal congestion).   . hydrochlorothiazide (HYDRODIURIL) 12.5 MG tablet TAKE 1 TABLET BY MOUTH EVERY DAY ALONG WITH LOSARTAN  . ibuprofen (ADVIL,MOTRIN) 200 MG tablet Take 200 mg by mouth every 6 (six) hours as needed.  . latanoprost (XALATAN) 0.005 % ophthalmic solution Place 1 drop into the left  eye at bedtime.   Marland Kitchen losartan (COZAAR) 100 MG tablet TAKE 1 TABLET BY MOUTH EVERY DAY ALONG WITH HCTZ  . LUTEIN PO Take 1 tablet by mouth daily.  . magnesium gluconate (MAGONATE) 500 MG tablet Take 500 mg by mouth 2 (two) times daily.  . metoprolol succinate (TOPROL-XL) 25 MG 24 hr tablet Take 1 tablet (25 mg total) by mouth daily. ** DO NOT CRUSH **    (BETA BLOCKER)  . mometasone-formoterol (DULERA) 200-5 MCG/ACT AERO Inhale 2 puffs into the lungs 2 (two) times daily.  . Omega-3 Fatty Acids (FISH OIL) 1200 MG CAPS  Take 3,600 mg by mouth daily.  . rosuvastatin (CRESTOR) 10 MG tablet TAKE 1 TABLET BY MOUTH EVERY DAY                       Objective:   Physical Exam  amb wm nad    02/12/2018       167  07/19/17 167 lb (75.8 kg)  12/01/16 167 lb 6.4 oz (75.9 kg)  05/17/16 164 lb (74.4 kg)     Vital signs reviewed - Note on arrival 02 sats  99% on RA       HEENT: nl dentition, turbinates bilaterally, and oropharynx. Nl external ear canals without cough reflex   NECK :  without JVD/Nodes/TM/ nl carotid upstrokes bilaterally   LUNGS: no acc muscle use,  Nl contour chest with minimal insp crackls in based  bilaterally without cough on insp or exp maneuvers   CV:  RRR  no s3 or murmur or increase in P2, and no edema   ABD:  soft and nontender with nl inspiratory excursion in the supine position. No bruits or organomegaly appreciated, bowel sounds nl  MS:  Nl gait/ ext warm without deformities, calf tenderness, cyanosis or clubbing No obvious joint restrictions   SKIN: warm and dry without lesions    NEURO:  alert, approp, nl sensorium with  no motor or cerebellar deficits apparent.            Assessment:

## 2018-02-12 NOTE — Patient Instructions (Addendum)
Dulera 100 Take 2 puffs first thing in am and then another 2 puffs about 12 hours later.   Work on inhaler technique:  relax and gently blow all the way out then take a nice smooth deep breath back in, triggering the inhaler at same time you start breathing in.  Hold for up to 5 seconds if you can. Blow out thru nose. Rinse and gargle with water when done     Resume flonase one twice daily point toward ear on same side    Only take the tessalon 100 mg if you can't stop coughing    Please schedule a follow up office visit in 4 weeks, sooner if needed

## 2018-02-13 ENCOUNTER — Encounter: Payer: Self-pay | Admitting: Internal Medicine

## 2018-02-13 NOTE — Assessment & Plan Note (Addendum)
Quit smokng 2004 - Spirometry 07/19/2017  FEV1 1.68 (61%)  Ratio 65 with classic curvature   - 02/12/2018  After extensive coaching inhaler device,  effectiveness =    75% from a baseline of 25% so change from dulera 200 to 100 2bid as cough is the main symptom    It is not clear at this point to what extent the cough is actually upper airway versus lower airway as most of his symptoms are nonspecific.  I recommended trying first to use the medicines he is already taking but do so more effectively by using nasal steroids and lower dose ICS with optimal technique for both and then returning here at the end of a trial of 4 weeks to see if he is improving and if not we can certainly start the cough algorithm then for this chronic problem.    I had an extended discussion with the patient reviewing all relevant studies completed to date and  lasting 15 to 20 minutes of a 25 minute visit    See device teaching which extended face to face time for this visit.  Each maintenance medication was reviewed in detail including emphasizing most importantly the difference between maintenance and prns and under what circumstances the prns are to be triggered using an action plan format that is not reflected in the computer generated alphabetically organized AVS which I have not found useful in most complex patients, especially with respiratory illnesses  Please see AVS for specific instructions unique to this visit that I personally wrote and verbalized to the the pt in detail and then reviewed with pt  by my nurse highlighting any  changes in therapy recommended at today's visit to their plan of care.

## 2018-02-14 ENCOUNTER — Telehealth: Payer: Self-pay | Admitting: Internal Medicine

## 2018-02-14 MED ORDER — BUDESONIDE-FORMOTEROL FUMARATE 80-4.5 MCG/ACT IN AERO: 2.0000 | INHALATION_SPRAY | Freq: Two times a day (BID) | RESPIRATORY_TRACT | 11 refills | Status: DC

## 2018-02-14 NOTE — Telephone Encounter (Signed)
Try symbicorty 80 2bid

## 2018-02-14 NOTE — Telephone Encounter (Signed)
Called and spoke with Patient's Wife, Inez Catalina.  MW recommendations given. Understanding stated. Prescription sent to [facility].  Nothing further at this time.  Per MW- Try symbicorty 80 2bid

## 2018-02-14 NOTE — Telephone Encounter (Signed)
Spoke with the pt's spouse  She states Dulera costs too much  She states Dr Melvyn Novas told her that he had another inhaler in mind if they could not afford Dulera  I explained that typically we would need his insurance formulary to choose an alternative  She states she does not have this information right now and wants [place] to pick a different inhaler  Please advise  Here is the last avs:  Dulera 100 Take 2 puffs first thing in am and then another 2 puffs about 12 hours later.    Work on inhaler technique:  relax and gently blow all the way out then take a nice smooth deep breath back in, triggering the inhaler at same time you start breathing in.  Hold for up to 5 seconds if you can. Blow out thru nose. Rinse and gargle with water when done      Resume flonase one twice daily point toward ear on same side      Only take the tessalon 100 mg if you can't stop coughing      Please schedule a follow up office visit in 4 weeks, sooner if needed

## 2018-02-17 ENCOUNTER — Other Ambulatory Visit: Payer: Self-pay | Admitting: Cardiology

## 2018-02-19 DIAGNOSIS — H43392 Other vitreous opacities, left eye: Secondary | ICD-10-CM | POA: Diagnosis not present

## 2018-02-19 DIAGNOSIS — H353222 Exudative age-related macular degeneration, left eye, with inactive choroidal neovascularization: Secondary | ICD-10-CM | POA: Diagnosis not present

## 2018-02-19 DIAGNOSIS — H353134 Nonexudative age-related macular degeneration, bilateral, advanced atrophic with subfoveal involvement: Secondary | ICD-10-CM | POA: Diagnosis not present

## 2018-02-19 DIAGNOSIS — H43812 Vitreous degeneration, left eye: Secondary | ICD-10-CM | POA: Diagnosis not present

## 2018-02-21 DIAGNOSIS — R69 Illness, unspecified: Secondary | ICD-10-CM | POA: Diagnosis not present

## 2018-03-12 ENCOUNTER — Ambulatory Visit: Payer: Medicare HMO | Admitting: Internal Medicine

## 2018-03-12 ENCOUNTER — Encounter: Payer: Self-pay | Admitting: Internal Medicine

## 2018-03-12 VITALS — BP 120/64 | HR 74 | Ht 68.0 in | Wt 168.0 lb

## 2018-03-12 DIAGNOSIS — J310 Chronic rhinitis: Secondary | ICD-10-CM | POA: Diagnosis not present

## 2018-03-12 DIAGNOSIS — J449 Chronic obstructive pulmonary disease, unspecified: Secondary | ICD-10-CM | POA: Diagnosis not present

## 2018-03-12 MED ORDER — BUDESONIDE-FORMOTEROL FUMARATE 80-4.5 MCG/ACT IN AERO: 2.0000 | INHALATION_SPRAY | Freq: Two times a day (BID) | RESPIRATORY_TRACT | 0 refills | Status: DC

## 2018-03-12 NOTE — Assessment & Plan Note (Addendum)
Reviewed approp use of afrin with flonase to help the topical steroid reach the intended target - see avs for instructions unique to this ov

## 2018-03-12 NOTE — Assessment & Plan Note (Addendum)
Quit smoking 2004 - Spirometry 07/19/2017  FEV1 1.68 (61%)  Ratio 65 with classic curvature   - 02/12/2018    changed from dulera 200 to 100 2bid as cough is the main symptom - 03/12/2018  After extensive coaching inhaler device,  effectiveness =    90% > continue on symbicort 80 as insurance prefers but ok to taper if doing great   Based on two studies from NEJM  378; 20 p 1865 (2018) and 380 : p2020-30 (2019) in pts with mild asthma it is reasonable to use low dose symbicort eg 80 2bid "prn" flare in this setting but I emphasized this was only shown with symbicort and takes advantage of the rapid onset of action but is not the same as "rescue therapy" but can be stopped once the acute symptoms have resolved and the need for rescue has been minimized (< 2 x weekly)     Pulmoanry f/u can be prn from this point as he is really responded nicely to low dose topical rx   I had an extended summary final discussion with the patient/wife  reviewing all relevant studies completed to date and  lasting 15 to 20 minutes of a 25 minute visit    See device teaching which extended face to face time for this visit.  Each maintenance medication was reviewed in detail including emphasizing most importantly the difference between maintenance and prns and under what circumstances the prns are to be triggered using an action plan format that is not reflected in the computer generated alphabetically organized AVS which I have not found useful in most complex patients, especially with respiratory illnesses  Please see AVS for specific instructions unique to this visit that I personally wrote and verbalized to the the pt in detail and then reviewed with pt  by my nurse highlighting any  changes in therapy recommended at today's visit to their plan of care.

## 2018-03-12 NOTE — Progress Notes (Signed)
Subjective:     Patient ID: Lance Dillon, male   DOB: [birth year],     MRN: [id number]    Brief patient profile:  89 yowm quit smoking 2004 at prostate surgery with good breathing then referred to pulmonary clinic 07/19/2017 by Dr   Shelia Media for ? Copd    History of Present Illness  07/19/2017 1st [facility] office visit/ Kimberely Mccannon   Chief Complaint  Patient presents with  . Pulmonary Consult    Referred by Dr. Shelia Media for eval of abnormal lung sounds. Pt denies any respiratory co's.  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 Acute Visit    Pt c/o cough since September 2019. He occ prod some clear sputum.   Dyspnea:  Not limited by breathing from desired activities  Including yardwork Cough: at hs  But not all night and then after stirs, better on tessalon Sleeping: better R side down/ bed flat  SABA use: none on dulera 200 2bid but hfa poor and ? dulera makes cough worse?  rec Dulera 100 Take 2 puffs first thing in am and then another 2 puffs about 12 hours later.  Work on inhaler technique:   Resume flonase one twice daily point toward ear on same side  Only take the tessalon 100 mg if you can't stop coughing     03/12/2018  f/u ov/Jadalee Westcott re: cough x sept 2019  Chief Complaint  Patient presents with  . Follow-up    Breathing is "great" and he has not had to use his rescue inhaler.    Dyspnea:  Not limited by breathing from desired activities   Cough: resolved / no longer need tessalon Sleeping: flat on either side  SABA use: none    No obvious day to day or daytime variability or assoc excess/ purulent sputum or mucus plugs or hemoptysis or cp or chest tightness, subjective wheeze or overt  hb  symptoms.   Sleeping as above  without nocturnal  or early am exacerbation  of respiratory  c/o's or need for noct saba. Also denies any obvious fluctuation of symptoms with weather or environmental changes or other aggravating or alleviating factors except as outlined above   No unusual exposure hx or h/o childhood pna/ asthma or knowledge of premature birth.  Current Allergies, Complete Past Medical History, Past Surgical History, Family History, and Social History were reviewed in [employer] record.  ROS  The following are not active complaints unless bolded Hoarseness, sore throat, dysphagia, dental problems, itching, sneezing,  nasal congestion or discharge of excess mucus or purulent secretions, ear ache,   fever, chills, sweats, unintended wt loss or wt gain, classically pleuritic or exertional cp,  orthopnea pnd or arm/hand swelling  or leg swelling, presyncope, palpitations, abdominal pain, anorexia, nausea, vomiting, diarrhea  or change in bowel habits or change in bladder habits, change in stools or change in urine, dysuria, hematuria,  rash, arthralgias, visual complaints, headache, numbness, weakness or ataxia or problems with walking or coordination,  change in mood or  memory.        Current Meds  Medication Sig  . acetaminophen (TYLENOL) 500 MG tablet Take 500 mg by mouth  every 6 (six) hours as needed.  Marland Kitchen albuterol (VENTOLIN HFA) 108 (90 Base) MCG/ACT inhaler Inhale 2 puffs into the lungs every 6 (six) hours as needed for wheezing or shortness of breath.  Marland Kitchen amLODipine (NORVASC) 10 MG tablet TAKE 1 TABLET (10 MG TOTAL) BY MOUTH DAILY. PLEASE SCHEDULE APPOINTMENT FOR REFILLS.  Marland Kitchen aspirin 81 MG tablet Take 81 mg by mouth daily.  . budesonide-formoterol (SYMBICORT) 80-4.5 MCG/ACT inhaler Inhale 2 puffs into the lungs 2 (two) times daily.  . Calcium Carbonate-Vitamin D (CALTRATE 600+D PO) Take 1 tablet by mouth daily.   . Cyanocobalamin (VITAMIN B 12 PO) Take  2,500 mg by mouth daily.  . fluticasone (FLONASE) 50 MCG/ACT nasal spray Place 2 sprays into both nostrils as needed (for nasal congestion).   . hydrochlorothiazide (HYDRODIURIL) 12.5 MG tablet TAKE 1 TABLET BY MOUTH EVERY DAY ALONG WITH LOSARTAN  . ibuprofen (ADVIL,MOTRIN) 200 MG tablet Take 200 mg by mouth every 6 (six) hours as needed.  . latanoprost (XALATAN) 0.005 % ophthalmic solution Place 1 drop into the left eye at bedtime.   Marland Kitchen losartan (COZAAR) 100 MG tablet TAKE 1 TABLET BY MOUTH EVERY DAY ALONG WITH HCTZ  . LUTEIN PO Take 1 tablet by mouth daily.  . magnesium gluconate (MAGONATE) 500 MG tablet Take 500 mg by mouth 2 (two) times daily.  . metoprolol succinate (TOPROL-XL) 25 MG 24 hr tablet Take 1 tablet (25 mg total) by mouth daily. ** DO NOT CRUSH **    (BETA BLOCKER)  . Omega-3 Fatty Acids (FISH OIL) 1200 MG CAPS Take 3,600 mg by mouth daily.  . rosuvastatin (CRESTOR) 10 MG tablet TAKE 1 TABLET BY MOUTH EVERY DAY                 Objective:   Physical Exam  amb wm all smiles    03/12/2018       168  02/12/2018       167  07/19/17 167 lb (75.8 kg)  12/01/16 167 lb 6.4 oz (75.9 kg)  05/17/16 164 lb (74.4 kg)     Vital signs reviewed - Note on arrival 02 sats  97% on RA      HEENT: nl dentition, turbinates bilaterally, and oropharynx. Nl external ear canals without cough reflex   NECK :  without JVD/Nodes/TM/ nl carotid upstrokes bilaterally   LUNGS: no acc muscle use,  Nl contour chest which is clear to A and P bilaterally without cough on insp or exp maneuvers   CV:  RRR  no s3 or murmur or increase in P2, and no edema   ABD:  soft and nontender with nl inspiratory excursion in the supine position. No bruits or organomegaly appreciated, bowel sounds nl  MS:  Nl gait/ ext warm without deformities, calf tenderness, cyanosis or clubbing No obvious joint restrictions   SKIN: warm and dry without lesions    NEURO:  alert, approp, nl sensorium with  no motor or  cerebellar deficits apparent.        Assessment:

## 2018-03-12 NOTE — Patient Instructions (Signed)
I emphasized that nasal steroids have no immediate benefit in terms of improving symptoms.  To help them reached the target tissue, the patient should use Afrin two puffs every 12 hours applied one min before using the nasal steroids.  Afrin should be stopped after no more than 5 days.  If the symptoms worsen, Afrin can be restarted after 5 days off of therapy to prevent rebound congestion from overuse of Afrin.  I also emphasized that in no way are nasal steroids a concern in terms of "addiction".    Symbicort 80 up to 2 pffs every 12 hours if needed if not doing great     If you are satisfied with your treatment plan,  let your doctor know and he/she can either refill your medications or you can return here when your prescription runs out.     If in any way you are not 100% satisfied,  please tell us.  If 100% better, tell your friends!  Pulmonary follow up is as needed

## 2018-03-15 DIAGNOSIS — J439 Emphysema, unspecified: Secondary | ICD-10-CM | POA: Diagnosis not present

## 2018-03-21 DIAGNOSIS — J069 Acute upper respiratory infection, unspecified: Secondary | ICD-10-CM | POA: Diagnosis not present

## 2018-03-21 DIAGNOSIS — I10 Essential (primary) hypertension: Secondary | ICD-10-CM | POA: Diagnosis not present

## 2018-03-21 DIAGNOSIS — J439 Emphysema, unspecified: Secondary | ICD-10-CM | POA: Diagnosis not present

## 2018-03-25 DIAGNOSIS — H353133 Nonexudative age-related macular degeneration, bilateral, advanced atrophic without subfoveal involvement: Secondary | ICD-10-CM | POA: Diagnosis not present

## 2018-03-25 DIAGNOSIS — H401121 Primary open-angle glaucoma, left eye, mild stage: Secondary | ICD-10-CM | POA: Diagnosis not present

## 2018-04-13 DIAGNOSIS — J439 Emphysema, unspecified: Secondary | ICD-10-CM | POA: Diagnosis not present

## 2018-04-15 ENCOUNTER — Telehealth: Payer: Self-pay | Admitting: Cardiovascular Disease

## 2018-04-15 NOTE — Telephone Encounter (Signed)
New Message   Pts wife is calling because she said the pt has poor circulation in his left leg and they have now noticed his toes are turning a dark color. Please call back

## 2018-04-15 NOTE — Telephone Encounter (Signed)
Dr Trula Slade has been taking care of his PAD

## 2018-04-15 NOTE — Telephone Encounter (Signed)
Spoke with pt's wife and wife noted yesterday on Left great toe dark area at base of toe and in between great toe and next toe no other symptoms Per wife looks some better today Will forward to Dr Gwenlyn Found for review and recommendations.Pt is taking Asa 81 mg and Rosuvastatin daily and not due for any dopplers until October./

## 2018-04-18 NOTE — Telephone Encounter (Signed)
DPR on file. Pt wife believes husband may have 'stubbed' toe on something and did not feel it so that may be responsible for the 'dark area'. She states it 'looks better now.' She would prefer Dr. Gwenlyn Found take care of his PAD. Message routed to Dr. Gwenlyn Found

## 2018-04-19 NOTE — Telephone Encounter (Signed)
That's fine with me. If toe improving can see back in 3-6  months

## 2018-04-24 DIAGNOSIS — C61 Malignant neoplasm of prostate: Secondary | ICD-10-CM | POA: Diagnosis not present

## 2018-04-25 NOTE — Telephone Encounter (Signed)
Spoke with pt wife, she would prefer to wait to see dr berry after dopplers are completed in oct. Recall placed [facility] November.

## 2018-05-07 DIAGNOSIS — C61 Malignant neoplasm of prostate: Secondary | ICD-10-CM | POA: Diagnosis not present

## 2018-05-07 DIAGNOSIS — N393 Stress incontinence (female) (male): Secondary | ICD-10-CM | POA: Diagnosis not present

## 2018-05-14 DIAGNOSIS — J439 Emphysema, unspecified: Secondary | ICD-10-CM | POA: Diagnosis not present

## 2018-06-03 DIAGNOSIS — R69 Illness, unspecified: Secondary | ICD-10-CM | POA: Diagnosis not present

## 2018-06-13 ENCOUNTER — Other Ambulatory Visit: Payer: Self-pay | Admitting: Cardiology

## 2018-06-13 DIAGNOSIS — J439 Emphysema, unspecified: Secondary | ICD-10-CM | POA: Diagnosis not present

## 2018-06-25 DIAGNOSIS — R69 Illness, unspecified: Secondary | ICD-10-CM | POA: Diagnosis not present

## 2018-07-14 DIAGNOSIS — J439 Emphysema, unspecified: Secondary | ICD-10-CM | POA: Diagnosis not present

## 2018-07-15 DIAGNOSIS — I10 Essential (primary) hypertension: Secondary | ICD-10-CM | POA: Diagnosis not present

## 2018-07-15 DIAGNOSIS — Z125 Encounter for screening for malignant neoplasm of prostate: Secondary | ICD-10-CM | POA: Diagnosis not present

## 2018-07-15 DIAGNOSIS — N390 Urinary tract infection, site not specified: Secondary | ICD-10-CM | POA: Diagnosis not present

## 2018-07-15 DIAGNOSIS — E785 Hyperlipidemia, unspecified: Secondary | ICD-10-CM | POA: Diagnosis not present

## 2018-07-18 DIAGNOSIS — I10 Essential (primary) hypertension: Secondary | ICD-10-CM | POA: Diagnosis not present

## 2018-07-18 DIAGNOSIS — J4541 Moderate persistent asthma with (acute) exacerbation: Secondary | ICD-10-CM | POA: Diagnosis not present

## 2018-07-18 DIAGNOSIS — C61 Malignant neoplasm of prostate: Secondary | ICD-10-CM | POA: Diagnosis not present

## 2018-07-18 DIAGNOSIS — J309 Allergic rhinitis, unspecified: Secondary | ICD-10-CM | POA: Diagnosis not present

## 2018-07-18 DIAGNOSIS — M810 Age-related osteoporosis without current pathological fracture: Secondary | ICD-10-CM | POA: Diagnosis not present

## 2018-07-18 DIAGNOSIS — N183 Chronic kidney disease, stage 3 (moderate): Secondary | ICD-10-CM | POA: Diagnosis not present

## 2018-07-18 DIAGNOSIS — N390 Urinary tract infection, site not specified: Secondary | ICD-10-CM | POA: Diagnosis not present

## 2018-07-18 DIAGNOSIS — R32 Unspecified urinary incontinence: Secondary | ICD-10-CM | POA: Diagnosis not present

## 2018-07-18 DIAGNOSIS — I779 Disorder of arteries and arterioles, unspecified: Secondary | ICD-10-CM | POA: Diagnosis not present

## 2018-07-18 DIAGNOSIS — A498 Other bacterial infections of unspecified site: Secondary | ICD-10-CM | POA: Diagnosis not present

## 2018-07-18 DIAGNOSIS — Z0000 Encounter for general adult medical examination without abnormal findings: Secondary | ICD-10-CM | POA: Diagnosis not present

## 2018-08-09 DIAGNOSIS — M5136 Other intervertebral disc degeneration, lumbar region: Secondary | ICD-10-CM | POA: Diagnosis not present

## 2018-08-09 DIAGNOSIS — M25561 Pain in right knee: Secondary | ICD-10-CM | POA: Diagnosis not present

## 2018-08-09 DIAGNOSIS — M1711 Unilateral primary osteoarthritis, right knee: Secondary | ICD-10-CM | POA: Diagnosis not present

## 2018-08-13 DIAGNOSIS — H4423 Degenerative myopia, bilateral: Secondary | ICD-10-CM | POA: Diagnosis not present

## 2018-08-13 DIAGNOSIS — J439 Emphysema, unspecified: Secondary | ICD-10-CM | POA: Diagnosis not present

## 2018-08-13 DIAGNOSIS — H353222 Exudative age-related macular degeneration, left eye, with inactive choroidal neovascularization: Secondary | ICD-10-CM | POA: Diagnosis not present

## 2018-08-13 DIAGNOSIS — H43812 Vitreous degeneration, left eye: Secondary | ICD-10-CM | POA: Diagnosis not present

## 2018-08-13 DIAGNOSIS — H353134 Nonexudative age-related macular degeneration, bilateral, advanced atrophic with subfoveal involvement: Secondary | ICD-10-CM | POA: Diagnosis not present

## 2018-08-15 ENCOUNTER — Other Ambulatory Visit: Payer: Self-pay | Admitting: Cardiology

## 2018-08-21 DIAGNOSIS — C61 Malignant neoplasm of prostate: Secondary | ICD-10-CM | POA: Diagnosis not present

## 2018-08-24 DIAGNOSIS — M5136 Other intervertebral disc degeneration, lumbar region: Secondary | ICD-10-CM | POA: Diagnosis not present

## 2018-08-26 DIAGNOSIS — M810 Age-related osteoporosis without current pathological fracture: Secondary | ICD-10-CM | POA: Diagnosis not present

## 2018-09-13 DIAGNOSIS — J439 Emphysema, unspecified: Secondary | ICD-10-CM | POA: Diagnosis not present

## 2018-10-01 DIAGNOSIS — I10 Essential (primary) hypertension: Secondary | ICD-10-CM | POA: Diagnosis not present

## 2018-10-01 DIAGNOSIS — N390 Urinary tract infection, site not specified: Secondary | ICD-10-CM | POA: Diagnosis not present

## 2018-10-14 DIAGNOSIS — J439 Emphysema, unspecified: Secondary | ICD-10-CM | POA: Diagnosis not present

## 2018-10-17 DIAGNOSIS — M5136 Other intervertebral disc degeneration, lumbar region: Secondary | ICD-10-CM | POA: Diagnosis not present

## 2018-10-23 DIAGNOSIS — R69 Illness, unspecified: Secondary | ICD-10-CM | POA: Diagnosis not present

## 2018-10-28 ENCOUNTER — Other Ambulatory Visit: Payer: Self-pay

## 2018-10-28 DIAGNOSIS — Z20822 Contact with and (suspected) exposure to covid-19: Secondary | ICD-10-CM

## 2018-10-29 LAB — NOVEL CORONAVIRUS, NAA: SARS-CoV-2, NAA: DETECTED — AB

## 2018-11-01 ENCOUNTER — Inpatient Hospital Stay (HOSPITAL_COMMUNITY): Admission: RE | Admit: 2018-11-01 | Payer: Medicare HMO | Source: Ambulatory Visit

## 2018-11-01 ENCOUNTER — Ambulatory Visit (HOSPITAL_COMMUNITY): Payer: Medicare HMO

## 2018-11-10 ENCOUNTER — Other Ambulatory Visit: Payer: Self-pay | Admitting: Cardiovascular Disease

## 2018-11-12 DIAGNOSIS — R69 Illness, unspecified: Secondary | ICD-10-CM | POA: Diagnosis not present

## 2018-11-13 DIAGNOSIS — J439 Emphysema, unspecified: Secondary | ICD-10-CM | POA: Diagnosis not present

## 2018-11-21 ENCOUNTER — Ambulatory Visit (HOSPITAL_BASED_OUTPATIENT_CLINIC_OR_DEPARTMENT_OTHER)
Admission: RE | Admit: 2018-11-21 | Discharge: 2018-11-21 | Disposition: A | Discharge status: Home / Self Care | Payer: Medicare HMO | Source: Ambulatory Visit | Attending: Cardiology | Admitting: Cardiology

## 2018-11-21 ENCOUNTER — Other Ambulatory Visit (HOSPITAL_COMMUNITY): Payer: Self-pay | Admitting: Cardiovascular Disease

## 2018-11-21 ENCOUNTER — Other Ambulatory Visit: Payer: Self-pay

## 2018-11-21 ENCOUNTER — Ambulatory Visit (HOSPITAL_COMMUNITY)
Admission: RE | Admit: 2018-11-21 | Discharge: 2018-11-21 | Disposition: A | Discharge status: Home / Self Care | Payer: Medicare HMO | Source: Ambulatory Visit | Attending: Cardiology | Admitting: Cardiology

## 2018-11-21 DIAGNOSIS — I739 Peripheral vascular disease, unspecified: Secondary | ICD-10-CM | POA: Diagnosis not present

## 2018-11-21 DIAGNOSIS — I701 Atherosclerosis of renal artery: Secondary | ICD-10-CM | POA: Diagnosis not present

## 2018-11-21 DIAGNOSIS — I6529 Occlusion and stenosis of unspecified carotid artery: Secondary | ICD-10-CM

## 2018-11-21 DIAGNOSIS — I6523 Occlusion and stenosis of bilateral carotid arteries: Secondary | ICD-10-CM

## 2018-12-06 DIAGNOSIS — C61 Malignant neoplasm of prostate: Secondary | ICD-10-CM | POA: Diagnosis not present

## 2018-12-07 ENCOUNTER — Other Ambulatory Visit: Payer: Self-pay | Admitting: Cardiovascular Disease

## 2018-12-11 DIAGNOSIS — R69 Illness, unspecified: Secondary | ICD-10-CM | POA: Diagnosis not present

## 2018-12-13 DIAGNOSIS — Z8546 Personal history of malignant neoplasm of prostate: Secondary | ICD-10-CM | POA: Diagnosis not present

## 2018-12-14 DIAGNOSIS — J439 Emphysema, unspecified: Secondary | ICD-10-CM | POA: Diagnosis not present

## 2018-12-16 ENCOUNTER — Other Ambulatory Visit (HOSPITAL_COMMUNITY): Payer: Self-pay | Admitting: Urology

## 2018-12-16 DIAGNOSIS — C61 Malignant neoplasm of prostate: Secondary | ICD-10-CM

## 2018-12-18 ENCOUNTER — Telehealth: Payer: Self-pay | Admitting: Cardiovascular Disease

## 2018-12-18 NOTE — Telephone Encounter (Signed)
Pts wife states that pt has difficulty seeing and needs help getting around so she is requesting to accompany him to his upcoming appt on 11/23. I told pts wife that she could accompany pt in order to help him get in and out of the office.

## 2018-12-18 NOTE — Telephone Encounter (Signed)
New Message   Patient wife states that she wants to come in with husband for visit on 12/23/18 and she states that the patient can't see well he has macular degeneration. Please advise.

## 2018-12-23 ENCOUNTER — Other Ambulatory Visit: Payer: Self-pay

## 2018-12-23 ENCOUNTER — Encounter: Payer: Self-pay | Admitting: Cardiovascular Disease

## 2018-12-23 ENCOUNTER — Ambulatory Visit: Payer: Medicare HMO | Admitting: Cardiovascular Disease

## 2018-12-23 VITALS — BP 108/56 | HR 75 | Ht 68.0 in | Wt 168.4 lb

## 2018-12-23 DIAGNOSIS — I6521 Occlusion and stenosis of right carotid artery: Secondary | ICD-10-CM

## 2018-12-23 DIAGNOSIS — I701 Atherosclerosis of renal artery: Secondary | ICD-10-CM | POA: Diagnosis not present

## 2018-12-23 DIAGNOSIS — I70213 Atherosclerosis of native arteries of extremities with intermittent claudication, bilateral legs: Secondary | ICD-10-CM | POA: Diagnosis not present

## 2018-12-23 DIAGNOSIS — E785 Hyperlipidemia, unspecified: Secondary | ICD-10-CM | POA: Diagnosis not present

## 2018-12-23 DIAGNOSIS — I10 Essential (primary) hypertension: Secondary | ICD-10-CM | POA: Diagnosis not present

## 2018-12-23 DIAGNOSIS — J449 Chronic obstructive pulmonary disease, unspecified: Secondary | ICD-10-CM | POA: Diagnosis not present

## 2018-12-23 DIAGNOSIS — I739 Peripheral vascular disease, unspecified: Secondary | ICD-10-CM

## 2018-12-23 NOTE — Assessment & Plan Note (Signed)
History remote tobacco abuse having quit in 2004.

## 2018-12-23 NOTE — Patient Instructions (Signed)
Medication Instructions:  Your physician recommends that you continue on your current medications as directed. Please refer to the Current Medication list given to you today.  If you need a refill on your cardiac medications before your next appointment, please call your pharmacy.   Lab work: FASTING Lipids and Hepatic Function If you have labs (blood work) drawn today and your tests are completely normal, you will receive your results only by: MyChart Message (if you have MyChart) OR A paper copy in the mail If you have any lab test that is abnormal or we need to change your treatment, we will call you to review the results.  Testing/Procedures: Your physician has requested that you have a renal artery duplex in ONE year. During this test, an ultrasound is used to evaluate blood flow to the kidneys. Allow one hour for this exam. Do not eat after midnight the day before and avoid carbonated beverages. Take your medications as you usually do.  Follow-Up: At [facility], you and your health needs are our priority.  As part of our continuing mission to provide you with exceptional heart care, we have created designated Provider Care Teams.  These Care Teams include your primary Cardiologist (physician) and Advanced Practice Providers (APPs -  Physician Assistants and Nurse Practitioners) who all work together to provide you with the care you need, when you need it. You may see Dr Gwenlyn Found or one of the following Advanced Practice Providers on your designated Care Team:    Kerin Ransom, PA-C  [place], [place]  Coletta Memos, [place]  Your physician wants you to follow-up in: [place]. You will receive a reminder letter in the mail two months in advance. If you don't receive a letter, please call our office to schedule the follow-up appointment.

## 2018-12-23 NOTE — Assessment & Plan Note (Signed)
History of peripheral arterial disease status post right femoropopliteal bypass grafting by Dr. Kellie Simmering back in 2013 after Dr. Trula Slade did angiography.  He has mild claudication when walking long distances.  His Dopplers are followed by Dr. Stephens Shire office.

## 2018-12-23 NOTE — Assessment & Plan Note (Signed)
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

## 2018-12-23 NOTE — Progress Notes (Signed)
12/23/2018 [facility]   [birth year]  [id number]  Primary Physician Deland Pretty, MD Primary Cardiologist: Lorretta Harp MD [facility], [place], [place]  HPI:  Lance Dillon is a 78 y.o.  formally a patient of Dr. Adonis Brook the past, then Dr. Ellyn Hack and currently I am care of his heart and peripheral vasculature.I last saw him in the office  12/26/2017.He has no prior cardiac history. He does have a history of hypertension and hyperlipidemia. He had angiography performed by Dr. Trula Slade 06/20/11 the setting of right lower extremely claudication. At the time of angiography he had demonstrated 70% left renal artery stenosis. He ultimately underwent right femoropopliteal bypass grafting by Dr. Kellie Simmering. Following his renal Doppler studies semiannually which have demonstrated a mild progressive decline in renal dimensions as well as increase in velocities. His left renal dimension is still 10.9 cm. 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 His most recent renal Doppler studies performed 04/19/16 revealed a left renal aortic ratio of 6.64 with a left renal dimension that had decreased to 1 cm.He denies chest pain or shortness of breath since I saw him last.   Since I saw him a year ago he is remained stable.    He denies chest pain or shortness of breath.  His most recent renal Dopplers performed 11/21/2018 revealed an occluded left renal artery, small left kidney with a cough and compensated right renal dimension 11.5 cm.  Carotid Dopplers performed at the same time showed known occluded bilateral ICAs.   Current Meds  Medication Sig  . acetaminophen (TYLENOL) 500 MG tablet Take 500 mg by mouth every 6 (six) hours as needed.  Marland Kitchen albuterol (VENTOLIN HFA) 108 (90 Base) MCG/ACT inhaler Inhale 2 puffs into the lungs every 6 (six) hours as needed for  wheezing or shortness of breath.  Marland Kitchen amLODipine (NORVASC) 10 MG tablet TAKE 1 TABLET (10 MG TOTAL) BY MOUTH DAILY. PLEASE SCHEDULE APPOINTMENT FOR REFILLS.  Marland Kitchen aspirin 81 MG tablet Take 81 mg by mouth daily.  . budesonide-formoterol (SYMBICORT) 80-4.5 MCG/ACT inhaler Inhale 2 puffs into the lungs 2 (two) times daily.  . Calcium Carbonate-Vitamin D (CALTRATE 600+D PO) Take 1 tablet by mouth daily.   . Cyanocobalamin (VITAMIN B 12 PO) Take 2,500 mg by mouth daily.  . fluticasone (FLONASE) 50 MCG/ACT nasal spray Place 2 sprays into both nostrils as needed (for nasal congestion).   . hydrochlorothiazide (HYDRODIURIL) 12.5 MG tablet TAKE 1 TABLET BY MOUTH EVERY DAY ALONG WITH LOSARTAN  . ibuprofen (ADVIL,MOTRIN) 200 MG tablet Take 200 mg by mouth every 6 (six) hours as needed.  . latanoprost (XALATAN) 0.005 % ophthalmic solution Place 1 drop into the left eye at bedtime.   Marland Kitchen losartan (COZAAR) 100 MG tablet TAKE 1 TABLET BY MOUTH EVERY DAY ALONG WITH HCTZ  . LUTEIN PO Take 1 tablet by mouth daily.  . magnesium gluconate (MAGONATE) 500 MG tablet Take 500 mg by mouth 2 (two) times daily.  . metoprolol succinate (TOPROL-XL) 25 MG 24 hr tablet Take 1 tablet (25 mg total) by mouth daily. ** DO NOT CRUSH **    (BETA BLOCKER)  . Omega-3 Fatty Acids (FISH OIL) 1200 MG CAPS Take 3,600 mg by mouth daily.  . rosuvastatin (CRESTOR) 10 MG tablet TAKE 1 TABLET BY MOUTH EVERY DAY     Allergies  Allergen Reactions  . Avelox [  Moxifloxacin Hcl In Nacl] Diarrhea    diarrhea  . Moxifloxacin Diarrhea    diarrhea    Social History   Socioeconomic History  . Marital status: Married    Spouse name: Not on file  . Number of children: Not on file  . Years of education: Not on file  . Highest education level: Not on file  Occupational History  . Occupation: Therapist, occupational: [employer]  Social Needs  . Financial resource strain: Not on file  . Food insecurity    Worry: Not on file     Inability: Not on file  . Transportation needs    Medical: Not on file    Non-medical: Not on file  Tobacco Use  . Smoking status: Former Smoker    Packs/day: 3.00    Years: 30.00    Pack years: 90.00    Types: Cigarettes    Quit date: 07/01/2002    Years since quitting: 16.4  . Smokeless tobacco: Never Used  Substance and Sexual Activity  . Alcohol use: No  . Drug use: No  . Sexual activity: Not on file  Lifestyle  . Physical activity    Days per week: Not on file    Minutes per session: Not on file  . Stress: Not on file  Relationships  . Social Herbalist on phone: Not on file    Gets together: Not on file    Attends religious service: Not on file    Active member of club or organization: Not on file    Attends meetings of clubs or organizations: Not on file    Relationship status: Not on file  . Intimate partner violence    Fear of current or ex partner: Not on file    Emotionally abused: Not on file    Physically abused: Not on file    Forced sexual activity: Not on file  Other Topics Concern  . 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   Usually walks 2 miles maybe 2-3 days a week. Just not a cold weather.   He is a former smoker, quit "cold [place] "after 3 pack per day for 60 pack years in 2004     Review of Systems: General: negative for chills, fever, night sweats or weight changes.  Cardiovascular: negative for chest pain, dyspnea on exertion, edema, orthopnea, palpitations, paroxysmal nocturnal dyspnea or shortness of breath Dermatological: negative for rash Respiratory: negative for cough or wheezing Urologic: negative for hematuria Abdominal: negative for nausea, vomiting, diarrhea, bright red blood per rectum, melena, or hematemesis Neurologic: negative for visual changes, syncope, or dizziness All other systems reviewed and  are otherwise negative except as noted above.    Blood pressure (!) 108/56, pulse 75, height 5\' 8"  (1.727 m), weight 168 lb 6.4 oz (76.4 kg), SpO2 94 %.  General appearance: alert and no distress Neck: no adenopathy, no carotid bruit, no JVD, supple, symmetrical, trachea midline and thyroid not enlarged, symmetric, no tenderness/mass/nodules Lungs: clear to auscultation bilaterally Heart: regular rate and rhythm, S1, S2 normal, no murmur, click, rub or gallop Extremities: extremities normal, atraumatic, no cyanosis or edema Pulses: 2+ and symmetric Skin: Skin color, texture, turgor normal. No rashes or lesions Neurologic: Alert and oriented X 3, normal strength and tone. Normal symmetric reflexes. Normal coordination and gait  EKG sinus rhythm  at 75 without ST or T wave changes.  I personally reviewed this EKG.  ASSESSMENT AND PLAN:   Atherosclerosis of native artery of extremity with intermittent claudication (HCC) History of peripheral arterial disease status post right femoropopliteal bypass grafting by Dr. Kellie Simmering back in 2013 after Dr. Trula Slade did angiography.  He has mild claudication when walking long distances.  His Dopplers are followed by Dr. Stephens Shire office.  Left renal artery stenosis - 70% by angiography; increased velocity on recent Dopplers (60-99%) History of left renal artery stenosis with recent Doppler performed 11/21/2018 revealing an occluded left renal artery with a small left renal dimension of 7.4 cm suggesting nonfunctioning kidney.  His right renal artery however is widely patent and his pole-to-pole length is 11.5 cm on the right.  We will continue to follow his renal Dopplers  Essential hypertension History of essential hypertension with blood pressure measured today at 108/56.  He is on amlodipine, hydrochlorothiazide, losartan and metoprolol.  Hyperlipidemia LDL goal <70 History of hyperlipidemia on statin therapy.  We will recheck a fasting lipid and liver  profile.  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      Lorretta Harp MD FACP,FACC,FAHA, [facility] 12/23/2018 9:09 AM

## 2018-12-23 NOTE — Assessment & Plan Note (Signed)
History of left renal artery stenosis with recent Doppler performed 11/21/2018 revealing an occluded left renal artery with a small left renal dimension of 7.4 cm suggesting nonfunctioning kidney.  His right renal artery however is widely patent and his pole-to-pole length is 11.5 cm on the right.  We will continue to follow his renal Dopplers

## 2018-12-23 NOTE — Assessment & Plan Note (Signed)
History of essential hypertension with blood pressure measured today at 108/56.  He is on amlodipine, hydrochlorothiazide, losartan and metoprolol.

## 2018-12-23 NOTE — Assessment & Plan Note (Signed)
History of hyperlipidemia on statin therapy.  We will recheck a fasting lipid and liver profile.

## 2018-12-24 DIAGNOSIS — I70213 Atherosclerosis of native arteries of extremities with intermittent claudication, bilateral legs: Secondary | ICD-10-CM | POA: Diagnosis not present

## 2018-12-24 DIAGNOSIS — I701 Atherosclerosis of renal artery: Secondary | ICD-10-CM | POA: Diagnosis not present

## 2018-12-24 DIAGNOSIS — I739 Peripheral vascular disease, unspecified: Secondary | ICD-10-CM | POA: Diagnosis not present

## 2018-12-24 LAB — HEPATIC FUNCTION PANEL
ALT: 37 IU/L (ref 0–44)
AST: 38 IU/L (ref 0–40)
Albumin: 4.5 g/dL (ref 3.7–4.7)
Alkaline Phosphatase: 60 IU/L (ref 39–117)
Bilirubin Total: 1 mg/dL (ref 0.0–1.2)
Bilirubin, Direct: 0.29 mg/dL (ref 0.00–0.40)
Total Protein: 7 g/dL (ref 6.0–8.5)

## 2018-12-24 LAB — LIPID PANEL
Chol/HDL Ratio: 2.2 ratio (ref 0.0–5.0)
Cholesterol, Total: 116 mg/dL (ref 100–199)
HDL: 53 mg/dL
LDL Chol Calc (NIH): 43 mg/dL (ref 0–99)
Triglycerides: 110 mg/dL (ref 0–149)
VLDL Cholesterol Cal: 20 mg/dL (ref 5–40)

## 2018-12-30 ENCOUNTER — Telehealth: Payer: Self-pay | Admitting: Cardiovascular Disease

## 2018-12-30 DIAGNOSIS — R69 Illness, unspecified: Secondary | ICD-10-CM | POA: Diagnosis not present

## 2018-12-30 NOTE — Telephone Encounter (Signed)
Wife updated with lab results. Wife also report and EKG was recently added to chart but they were unable to view it. Advised wife that EKG that was performed at office visit on 11/23 was upload and MD report NSR. Wife verbalized understanding.

## 2018-12-30 NOTE — Telephone Encounter (Signed)
Patients wife is calling in regards to lab results stating they were unable to view them in [place].

## 2019-01-01 ENCOUNTER — Encounter (HOSPITAL_COMMUNITY)
Admission: RE | Admit: 2019-01-01 | Discharge: 2019-01-01 | Disposition: A | Discharge status: Home / Self Care | Payer: Medicare HMO | Source: Ambulatory Visit | Attending: Urology | Admitting: Urology

## 2019-01-01 ENCOUNTER — Other Ambulatory Visit: Payer: Self-pay

## 2019-01-01 ENCOUNTER — Ambulatory Visit (HOSPITAL_COMMUNITY)
Admission: RE | Admit: 2019-01-01 | Discharge: 2019-01-01 | Disposition: A | Discharge status: Home / Self Care | Payer: Medicare HMO | Source: Ambulatory Visit | Attending: Urology | Admitting: Urology

## 2019-01-01 DIAGNOSIS — C61 Malignant neoplasm of prostate: Secondary | ICD-10-CM | POA: Diagnosis not present

## 2019-01-01 DIAGNOSIS — Z8546 Personal history of malignant neoplasm of prostate: Secondary | ICD-10-CM | POA: Diagnosis not present

## 2019-01-01 MED ORDER — TECHNETIUM TC 99M MEDRONATE IV KIT
21.8000 | PACK | Freq: Once | INTRAVENOUS | Status: AC
  Administered 2019-01-01: 21.8 via INTRAVENOUS

## 2019-01-02 ENCOUNTER — Other Ambulatory Visit: Payer: Self-pay | Admitting: Cardiovascular Disease

## 2019-01-13 DIAGNOSIS — J439 Emphysema, unspecified: Secondary | ICD-10-CM | POA: Diagnosis not present

## 2019-01-14 ENCOUNTER — Other Ambulatory Visit: Payer: Self-pay | Admitting: Cardiovascular Disease

## 2019-02-13 DIAGNOSIS — J439 Emphysema, unspecified: Secondary | ICD-10-CM | POA: Diagnosis not present

## 2019-02-24 ENCOUNTER — Telehealth: Payer: Self-pay | Admitting: Cardiovascular Disease

## 2019-02-24 NOTE — Telephone Encounter (Signed)
Pt c/o of Chest Pain: STAT if CP now or developed within 24 hours  1. Are you having CP right now? Yes  2. Are you experiencing any other symptoms (ex. SOB, nausea, vomiting, sweating)? No  3. How long have you been experiencing CP? Past couple of weeks  4. Is your CP continuous or coming and going? Continuous  5. Have you taken Nitroglycerin? No ? Patient's wife is calling stating the patient has been experiencing soreness in his chest from shoulder to shoulder for the past couple weeks. She states they had thought he possibly pulled a muscle but is now wondering if an appointment needs to be made in regards to it. Please advise.

## 2019-02-24 NOTE — Telephone Encounter (Signed)
Spoke with pts wife who is concerned with ongoing chest soreness for the last 2 weeks. She states this is not traditional CP, but a soreness that "runs across the top part of his chest from shoulder to shoulder." She states prior to his symptoms, he was working "pulling wrenches all day" and that is when he noticed his soreness. He has taken advil and tylenol which relieved the pain. Activity does not make it worse. Pt denies any accompanied SOB, diaphoresis or radiating back or neck pain.   I advised pts wife that the description of his chest soreness could possibly be musculoskeletal in nature. I advised her to contact his PCP, Dr. Shelia Media, for evaluation. In the meantime, I will also forward to Dr. Gwenlyn Found and his RN for any additional recommendation in case he would like him to be seen this week.  Pts wife has agreed with plan and will contact Dr. Pennie Banter office.

## 2019-02-24 NOTE — Telephone Encounter (Signed)
[name] needs to be seen by an APP sometime this week to further evaluate his chest pain.

## 2019-02-25 ENCOUNTER — Encounter: Payer: Self-pay | Admitting: Cardiovascular Disease

## 2019-02-25 DIAGNOSIS — M25511 Pain in right shoulder: Secondary | ICD-10-CM | POA: Diagnosis not present

## 2019-02-25 DIAGNOSIS — R0789 Other chest pain: Secondary | ICD-10-CM | POA: Diagnosis not present

## 2019-02-25 DIAGNOSIS — M25512 Pain in left shoulder: Secondary | ICD-10-CM | POA: Diagnosis not present

## 2019-02-25 DIAGNOSIS — R079 Chest pain, unspecified: Secondary | ICD-10-CM | POA: Diagnosis not present

## 2019-02-25 NOTE — Telephone Encounter (Signed)
New Message  Pt's wife is calling and stated that Dr. Gwenlyn Found said that he needs to make an appt this week. However, he doesn't have anything available for the rest of the week. She was returning a phone call from [facility] about scheduling and medication  Please call to discuss

## 2019-02-25 NOTE — Telephone Encounter (Signed)
This encounter was created in error - please disregard.

## 2019-02-25 NOTE — Telephone Encounter (Signed)
Called patient, and spoke to wife- she states that patient has issues seeing, and she helps him with his health. I will route to Denyse Amass to make aware that wife will be coming to appointment. Thank you!

## 2019-02-25 NOTE — Progress Notes (Signed)
Cardiology Clinic Note   Patient Name: Lance Dillon Date of Encounter: 02/26/2019  Primary Care Provider:  Deland Pretty, MD Primary Cardiologist:  Glenetta Hew, MD  Patient Profile    Lance Dillon is a 79 year old male presents today for evaluation of his chest discomfort.  Past Medical History    Past Medical History:  Diagnosis Date  . Arthritis   . Atherosclerosis of aortic bifurcation and common iliac arteries [facility]) February 2017    Noted on abdominal/renal Dopplers  . Blind right eye   . 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 Cataract   . Colon polyps 2004, 07, 14  . COPD (chronic obstructive pulmonary disease) ([place])   . Glaucoma    left eye   . Heart murmur    No evidence of valvular lesion noted on Echo 01/2010  . Hx of radiation therapy 03/22/11 to 05/08/11   PSA recurrent carcinoma of prostate  . Hypercholesterolemia   . Hypertension   . Macular degeneration   . Macular degeneration of both eyes   . Peripheral vascular disease [facility]) June 2013   Bilateral SFA stenosis: Status post right femoropopliteal bypass - Dr. Kellie Simmering  . 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 Prostate cancer ([place]) 2004  . Renal artery stenosis ([facility])    70% left  . Stones in the urinary tract   . Stroke ([place])   . Urinary incontinence    Past Surgical History:  Procedure Laterality Date  . ABDOMINAL AORTAGRAM N/A 06/20/2011   Procedure: ABDOMINAL Maxcine Ham;  Surgeon: Serafina Mitchell, MD;  Location: [facility] CATH LAB;  Service: Cardiovascular;  Laterality: N/A;  . CARDIOVASCULAR STRESS TEST  07/06/2011   Evidence of mild ischemia in Basal Inferior and Mid Inferior regions, no significant wall motion abnormalities noted.  . clamp to penis     for urinary control    . COLONOSCOPY    . COLONOSCOPY W/ BIOPSIES     multiple   . CYSTOSCOPY W/ URETERAL STENT PLACEMENT Left 06/07/2015   Procedure: CYSTOSCOPY, RETROGRADE PYLEGRAM  WITH STENT PLACEMENT;  Surgeon: Bjorn Loser, MD;  Location: [facility] ORS;  Service: Urology;  Laterality: Left;  . CYSTOSCOPY WITH RETROGRADE PYELOGRAM, URETEROSCOPY AND STENT PLACEMENT Left 07/28/2015   Procedure: CYSTOSCOPY, URETEROSCOPY, WITH BASKET STONE REMOVAL;  Surgeon: Raynelle Bring, MD;  Location: [facility] ORS;  Service: Urology;  Laterality: Left;  . EYE SURGERY Right Aug. 2016   Cataract; removed bilat  . FEMORAL-POPLITEAL BYPASS GRAFT  07/19/2011   Procedure: BYPASS GRAFT FEMORAL-POPLITEAL ARTERY;  Surgeon: Mal Misty, MD;  Location: [facility] OR;  Service: Vascular;  Laterality: Right;  Right Femoral - Popliteal  Bypss with saphenous vein  . HERNIA REPAIR     right side,lft  . INTRAOPERATIVE ARTERIOGRAM  07/19/2011   Procedure: INTRA OPERATIVE ARTERIOGRAM;  Surgeon: Mal Misty, MD;  Location: [place];  Service: Vascular;  Laterality: Right;  . NM MYOVIEW LTD  June 2013   Normal EF, very small area of basal-mid inferior ischemia; LOW RISK  . PENILE PROSTHESIS  REMOVAL    . PENILE PROSTHESIS IMPLANT    . PR VEIN BYPASS GRAFT,AORTO-FEM-POP  07/19/11   Right  Fem/Pop BPG  . PROSTATE BIOPSY    . prostatectomy retropubic radical  07/15/2002   with  nerve sparing, Gleason 3+4=7  . Reclast  July 25, 2013  . RENAL DUPLEX  07/01/2012   Left renal artery 60-99% diameter reduction  . TRANSTHORACIC ECHOCARDIOGRAM  January   Normal EF and overall function  . TRANSTHORACIC ECHOCARDIOGRAM  02/23/2010   EF >51%, lv systolic function normal    Allergies  Allergies  Allergen Reactions  . Avelox [Moxifloxacin Hcl In Nacl] Diarrhea    diarrhea  . Moxifloxacin Diarrhea    diarrhea    History of Present Illness    Lance Dillon has a past medical history of atherosclerosis of extremity with intermittent claudication, PVD, left renal artery  stenosis, essential hypertension, cerebral aneurysm without rupture, COPD, hydronephrosis, hyperlipidemia, dizziness, and vertigo.  He was previously a patient of Dr. Rollene Fare, was a patient of Dr. Ellyn Hack, and now is a patient of Dr. Gwenlyn Found who takes care of this cardiac and PVD needs.  He had been angiography performed by Dr. Trula Slade 06/20/2011 in the setting of right lower extremity claudication.  Angiography demonstrated 70% left renal artery stenosis.  He underwent right femoral-popliteal bypass grafting by Dr. Kellie Simmering.  His semiannual renal Doppler studies have shown a mild progressive decline in his renal dimensions as well as increased velocities.  He underwent an MRI in the setting of dizziness which showed posterior claudication and cerebral aneurysm with known occlusion of his carotid arteries bilaterally.  His renal Doppler studies performed 04/19/2016 showed left renal artery ratio of 6.64 with a left renal dimension had decreased to 1 cm.  He was last seen by Dr. Gwenlyn Found 12/23/2018.  During that time he was doing well and denied chest pain or shortness of breath.  His most recent renal ultrasound (11/21/2018) showed an occluded left renal artery, small left kidney and compensated right renal dimensions of 11.5 cm.  His carotid ultrasound performed at that time showed known occluded bilateral ICAs.  No medications were changed at that time.  His carotid Dopplers remained stable.   He presents the clinic today for evaluation of his chest pain and states 2 to 3 weeks ago he had been working on a boiler.  He was required to do more strenuous activity than normal using wrenches and lifting.  Since that time he has had soreness across his back and chest as well as a increased tightness.  He presented to his PCP 02/25/2019 they indicated they felt this was musculoskeletal in nature.  I also ordered a CXR.  This was reviewed with Dr. Percival Spanish today and showed no acute findings.  EKG was normal.  His pain is  relieved with acetaminophen.  He continues to stay active and eat a heart healthy diet.  I agree with PCP and that this is musculoskeletal pain in nature.  He denies chest pain, shortness of breath, lower extremity edema, fatigue, palpitations, melena, hematuria, hemoptysis, diaphoresis, weakness, presyncope, syncope, orthopnea, and PND.   Home Medications    Prior to Admission medications   Medication Sig Start Date End Date Taking? Authorizing Provider  acetaminophen (TYLENOL) 500 MG tablet Take 500 mg by mouth every 6 (six) hours as needed.    [provider]  albuterol (VENTOLIN HFA) 108 (90 Base) MCG/ACT inhaler Inhale 2 puffs into the lungs every 6 (six) hours as needed for wheezing or shortness of breath.    [provider]  amLODipine (NORVASC) 10 MG tablet Take 1 tablet (10 mg total) by mouth daily. 01/02/19   Lorretta Harp, MD  aspirin 81 MG tablet Take  81 mg by mouth daily.    [provider]  budesonide-formoterol (SYMBICORT) 80-4.5 MCG/ACT inhaler Inhale 2 puffs into the lungs 2 (two) times daily. 03/12/18   Tanda Rockers, MD  Calcium Carbonate-Vitamin D (CALTRATE 600+D PO) Take 1 tablet by mouth daily.     [provider]  Cyanocobalamin (VITAMIN B 12 PO) Take 2,500 mg by mouth daily.    [provider]  fluticasone (FLONASE) 50 MCG/ACT nasal spray Place 2 sprays into both nostrils as needed (for nasal congestion).  09/02/12   [provider]  hydrochlorothiazide (HYDRODIURIL) 12.5 MG tablet TAKE 1 TABLET BY MOUTH EVERY DAY ALONG WITH LOSARTAN 12/14/17   [provider]  ibuprofen (ADVIL,MOTRIN) 200 MG tablet Take 200 mg by mouth every 6 (six) hours as needed.    [provider]  latanoprost (XALATAN) 0.005 % ophthalmic solution Place 1 drop into the left eye at bedtime.  09/18/13   [provider]  losartan (COZAAR) 100 MG tablet TAKE 1 TABLET BY MOUTH EVERY DAY ALONG WITH HCTZ 06/14/18   Leonie Man, MD  LUTEIN PO Take 1 tablet by mouth daily.    [provider]  magnesium gluconate (MAGONATE) 500 MG tablet Take 500 mg by mouth 2 (two) times daily.    [provider]  metoprolol succinate (TOPROL-XL) 25 MG 24 hr tablet TAKE 1 TABLET (25 MG TOTAL) BY MOUTH DAILY. ** DO NOT CRUSH ** (BETA BLOCKER) 01/14/19   Lorretta Harp, MD  Omega-3 Fatty Acids (FISH OIL) 1200 MG CAPS Take 3,600 mg by mouth daily.    [provider]  rosuvastatin (CRESTOR) 10 MG tablet TAKE 1 TABLET BY MOUTH EVERY DAY 01/14/19   Lorretta Harp, MD    Family History    Family History  Problem Relation Age of Onset  . Prostate cancer Brother 50  . Heart disease Brother        Before age 29  . Hypertension Brother   . Heart attack Brother   . Aneurysm Father   . Heart disease Father   . Hypertension Father   . Heart disease Mother        Before age 19  . Hypertension Mother   . Heart attack Mother   . Heart disease Sister   . Hypertension Sister   . Heart attack Sister   . Colon cancer Neg Hx   . Esophageal cancer Neg Hx   . Stomach cancer Neg Hx   . Rectal cancer Neg Hx   . Colon polyps Neg Hx    He indicated that his mother is deceased. He indicated that his father is deceased. He indicated that his sister is deceased. He indicated that his brother is deceased. He indicated that the status of his neg hx is unknown.  Social History    Social History   Socioeconomic History  . Marital status: Married    Spouse name: Not on file  . Number of children: Not on file  . Years of education: Not on file  . Highest education level: Not on file  Occupational History  . Occupation: Therapist, occupational: [employer]  Tobacco Use  . Smoking status: Former Smoker    Packs/day: 3.00    Years: 30.00    Pack years: 90.00    Types: Cigarettes    Quit date: 07/01/2002    Years since quitting: 16.6  . Smokeless tobacco: Never Used  Substance and Sexual  Activity  . Alcohol use: No  . Drug use: No  . Sexual activity: Not on file  Other Topics Concern  . 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   Usually walks 2 miles maybe 2-3 days a week. Just not a cold weather.   He is a former smoker, quit "cold [place] "after 3 pack per day for 60 pack years in 2004   Social Determinants of Health   Financial Resource Strain:   . Difficulty of Paying Living Expenses: Not on file  Food Insecurity:   . Worried About Charity fundraiser in the Last Year: Not on file  . Ran Out of Food in the Last Year: Not on file  Transportation Needs:   . Lack of Transportation (Medical): Not on file  . Lack of Transportation (Non-Medical): Not on file  Physical Activity:   . Days of Exercise per Week: Not on file  . Minutes of Exercise per Session: Not on file  Stress:   . Feeling of Stress : Not on file  Social Connections:   . Frequency of Communication with Friends and Family: Not on file  . Frequency of Social Gatherings with Friends and Family: Not on file  . Attends Religious Services: Not on file  . Active Member of Clubs or Organizations: Not on file  . Attends Archivist Meetings: Not on file  . Marital Status: Not on file  Intimate Partner Violence:   . Fear of Current or Ex-Partner: Not on file  . Emotionally Abused: Not on file  . Physically Abused: Not on file  . Sexually Abused: Not on file     Review of Systems    General:  No chills, fever, night sweats or weight changes.  Cardiovascular:  No chest pain, dyspnea on exertion, edema, orthopnea, palpitations, paroxysmal nocturnal dyspnea. Dermatological: No rash, lesions/masses Respiratory: No cough, dyspnea Urologic: No hematuria, dysuria Abdominal:   No nausea, vomiting, diarrhea, bright red blood per rectum, melena, or hematemesis Neurologic:  No visual  changes, wkns, changes in mental status. All other systems reviewed and are otherwise negative except as noted above.  Physical Exam    VS:  BP 106/65 (BP Location: Left Arm, Patient Position: Sitting, Cuff Size: Normal)   Pulse 81   Ht 5\' 8"  (1.727 m)   Wt 166 lb (75.3 kg)   SpO2 98%   BMI 25.24 kg/m  , BMI Body mass index is 25.24 kg/m. GEN: Well nourished, well developed, in no acute distress. HEENT: normal. Neck: Supple, no JVD, carotid bruits, or masses. Cardiac: RRR, no murmurs, rubs, or gallops. No clubbing, cyanosis, edema.  Radials/DP/PT 2+ and equal bilaterally.  Respiratory:  Respirations regular and unlabored, clear to auscultation bilaterally. GI: Soft, nontender, nondistended, BS + x 4. MS: no deformity or atrophy. Skin: warm and dry, no rash. Neuro:  Strength and sensation are intact. Psych: Normal affect.  Accessory Clinical Findings    ECG personally reviewed by me today-normal sinus rhythm 79 bpm no ST or T wave deviation- No acute changes  EKG 12/24/2018 Normal sinus rhythm 75 bpm  Echocardiogram 02/23/2010 LVEF greater than 27% LV systolic function normal, left ventricular size normal, Mild aortic regurgitation, no pericardial effusion  Carotid ultrasound 11/21/2018 Right Carotid: Evidence consistent with a total occlusion of the right  ICA.   Left Carotid: Velocities in the left ICA are consistent  with a 1-39%  stenosiswith low velocities in a tapered vessel consistent with chronic intracranial occlusion.   Vertebrals: Bilateral vertebral arteries demonstrate antegrade flow.  Subclavians: Normal flow hemodynamics were seen in bilateral subclavian arteries.  Assessment & Plan   1.  Chest pain-mild chest wall soreness today.  Chest pain started 2 to 3 weeks ago with increased physical activity and strenuous work lifting, using wrenches on a boiler. Continue amlodipine 10 mg tablet daily Continue metoprolol succinate 25 mg tablet daily Heart healthy  low-sodium diet Increase physical activity as tolerated Continue acetaminophen as needed not to exceed 3 g daily.  Essential hypertension-BP today 106/65 Continue HCTZ 12.5 mg daily  Continue losartan 100 mg tablet daily Continue metoprolol succinate 25 mg tablet daily heart healthy low-sodium diet-salty 6 given Increase physical activity as tolerated  Hyperlipidemia-LDL 55 12/22/2015 Continue rosuvastatin 10 mg tablet daily Heart healthy low-sodium high-fiber diet Increase physical activity as tolerated  COPD-no increase shortness of breath today.  Chest x-ray reviewed with DOD no acute findings. Continue Symbicort 2 puffs 2 times daily Followed by PCP  Right carotid occlusion-history of bilateral ICA occlusion evident by [place] which remained stable on the last study (11/21/2018).  Disposition: Follow-up with Dr. Gwenlyn Found in November.  Jossie Ng. [facility] [place] [address] Office [phone] Fax [phone]

## 2019-02-25 NOTE — Telephone Encounter (Signed)
Returned call to wife-no answer, lmtcb.

## 2019-02-25 NOTE — Telephone Encounter (Signed)
Scheduled patient to see Coletta Memos tomorrow at 9:30am. Wife is requesting to come with patient. Advised her we are asking that they come alone unless it is for a medical reason. Patients wife advised understanding but would like a nurse to call her about coming into the office.

## 2019-02-25 NOTE — Telephone Encounter (Signed)
Left message (ok per DPR) to call back to schedule appt this week.

## 2019-02-26 ENCOUNTER — Ambulatory Visit: Payer: Medicare HMO | Admitting: General Practice

## 2019-02-26 ENCOUNTER — Encounter: Payer: Self-pay | Admitting: General Practice

## 2019-02-26 ENCOUNTER — Other Ambulatory Visit: Payer: Self-pay

## 2019-02-26 VITALS — BP 106/65 | HR 81 | Ht 68.0 in | Wt 166.0 lb

## 2019-02-26 DIAGNOSIS — I10 Essential (primary) hypertension: Secondary | ICD-10-CM | POA: Diagnosis not present

## 2019-02-26 DIAGNOSIS — E785 Hyperlipidemia, unspecified: Secondary | ICD-10-CM | POA: Diagnosis not present

## 2019-02-26 DIAGNOSIS — I6521 Occlusion and stenosis of right carotid artery: Secondary | ICD-10-CM

## 2019-02-26 DIAGNOSIS — R0789 Other chest pain: Secondary | ICD-10-CM

## 2019-02-26 DIAGNOSIS — J449 Chronic obstructive pulmonary disease, unspecified: Secondary | ICD-10-CM

## 2019-02-26 NOTE — Patient Instructions (Signed)
Reduce your risk of getting COVID-19 With your heart disease it is especially important for people at increased risk of severe illness from COVID-19, and those who live with them, to protect themselves from getting COVID-19. The best way to protect yourself and to help reduce the spread of the virus that causes COVID-19 is to: Marland Kitchen Limit your interactions with other people as much as possible. . Take precautions to prevent getting COVID-19 when you do interact with others. If you start feeling sick and think you may have COVID-19, get in touch with your healthcare provider within 24 hours.  Follow-Up: keep scheduled appointment   At [facility], you and your health needs are our priority.  As part of our continuing mission to provide you with exceptional heart care, we have created designated Provider Care Teams.  These Care Teams include your primary Cardiologist (physician) and Advanced Practice Providers (APPs -  Physician Assistants and Nurse Practitioners) who all work together to provide you with the care you need, when you need it.  Thank you for choosing [facility] at [facility]!!

## 2019-03-10 DIAGNOSIS — M47896 Other spondylosis, lumbar region: Secondary | ICD-10-CM | POA: Diagnosis not present

## 2019-03-10 DIAGNOSIS — M5136 Other intervertebral disc degeneration, lumbar region: Secondary | ICD-10-CM | POA: Diagnosis not present

## 2019-03-10 DIAGNOSIS — M47816 Spondylosis without myelopathy or radiculopathy, lumbar region: Secondary | ICD-10-CM | POA: Diagnosis not present

## 2019-03-13 DIAGNOSIS — Z8546 Personal history of malignant neoplasm of prostate: Secondary | ICD-10-CM | POA: Diagnosis not present

## 2019-03-27 DIAGNOSIS — M47896 Other spondylosis, lumbar region: Secondary | ICD-10-CM | POA: Diagnosis not present

## 2019-03-27 DIAGNOSIS — M47816 Spondylosis without myelopathy or radiculopathy, lumbar region: Secondary | ICD-10-CM | POA: Diagnosis not present

## 2019-04-09 ENCOUNTER — Other Ambulatory Visit: Payer: Self-pay | Admitting: Cardiovascular Disease

## 2019-04-24 DIAGNOSIS — M25561 Pain in right knee: Secondary | ICD-10-CM | POA: Diagnosis not present

## 2019-06-03 DIAGNOSIS — Z8546 Personal history of malignant neoplasm of prostate: Secondary | ICD-10-CM | POA: Diagnosis not present

## 2019-06-11 DIAGNOSIS — N35012 Post-traumatic membranous urethral stricture: Secondary | ICD-10-CM | POA: Diagnosis not present

## 2019-06-11 DIAGNOSIS — C61 Malignant neoplasm of prostate: Secondary | ICD-10-CM | POA: Diagnosis not present

## 2019-06-11 DIAGNOSIS — N393 Stress incontinence (female) (male): Secondary | ICD-10-CM | POA: Diagnosis not present

## 2019-07-21 ENCOUNTER — Other Ambulatory Visit: Payer: Self-pay | Admitting: Cardiovascular Disease

## 2019-07-22 DIAGNOSIS — Z125 Encounter for screening for malignant neoplasm of prostate: Secondary | ICD-10-CM | POA: Diagnosis not present

## 2019-07-22 DIAGNOSIS — E785 Hyperlipidemia, unspecified: Secondary | ICD-10-CM | POA: Diagnosis not present

## 2019-07-22 DIAGNOSIS — I10 Essential (primary) hypertension: Secondary | ICD-10-CM | POA: Diagnosis not present

## 2019-07-22 DIAGNOSIS — N390 Urinary tract infection, site not specified: Secondary | ICD-10-CM | POA: Diagnosis not present

## 2019-07-25 ENCOUNTER — Other Ambulatory Visit: Payer: Self-pay | Admitting: Cardiovascular Disease

## 2019-07-29 DIAGNOSIS — I739 Peripheral vascular disease, unspecified: Secondary | ICD-10-CM | POA: Diagnosis not present

## 2019-07-29 DIAGNOSIS — J439 Emphysema, unspecified: Secondary | ICD-10-CM | POA: Diagnosis not present

## 2019-07-29 DIAGNOSIS — M419 Scoliosis, unspecified: Secondary | ICD-10-CM | POA: Diagnosis not present

## 2019-07-29 DIAGNOSIS — N1831 Chronic kidney disease, stage 3a: Secondary | ICD-10-CM | POA: Diagnosis not present

## 2019-07-29 DIAGNOSIS — Z0001 Encounter for general adult medical examination with abnormal findings: Secondary | ICD-10-CM | POA: Diagnosis not present

## 2019-07-29 DIAGNOSIS — M810 Age-related osteoporosis without current pathological fracture: Secondary | ICD-10-CM | POA: Diagnosis not present

## 2019-07-29 DIAGNOSIS — E785 Hyperlipidemia, unspecified: Secondary | ICD-10-CM | POA: Diagnosis not present

## 2019-07-29 DIAGNOSIS — I779 Disorder of arteries and arterioles, unspecified: Secondary | ICD-10-CM | POA: Diagnosis not present

## 2019-07-29 DIAGNOSIS — C61 Malignant neoplasm of prostate: Secondary | ICD-10-CM | POA: Diagnosis not present

## 2019-07-29 DIAGNOSIS — I10 Essential (primary) hypertension: Secondary | ICD-10-CM | POA: Diagnosis not present

## 2019-08-07 ENCOUNTER — Other Ambulatory Visit (HOSPITAL_COMMUNITY): Payer: Self-pay | Admitting: Interventional Radiology

## 2019-08-07 DIAGNOSIS — I671 Cerebral aneurysm, nonruptured: Secondary | ICD-10-CM

## 2019-08-07 DIAGNOSIS — I771 Stricture of artery: Secondary | ICD-10-CM

## 2019-08-13 ENCOUNTER — Ambulatory Visit (INDEPENDENT_AMBULATORY_CARE_PROVIDER_SITE_OTHER): Payer: Medicare HMO | Admitting: Ophthalmology

## 2019-08-13 ENCOUNTER — Encounter (INDEPENDENT_AMBULATORY_CARE_PROVIDER_SITE_OTHER): Payer: Self-pay | Admitting: Ophthalmology

## 2019-08-13 ENCOUNTER — Other Ambulatory Visit: Payer: Self-pay

## 2019-08-13 DIAGNOSIS — H43812 Vitreous degeneration, left eye: Secondary | ICD-10-CM | POA: Diagnosis not present

## 2019-08-13 DIAGNOSIS — H353134 Nonexudative age-related macular degeneration, bilateral, advanced atrophic with subfoveal involvement: Secondary | ICD-10-CM | POA: Insufficient documentation

## 2019-08-13 DIAGNOSIS — H401133 Primary open-angle glaucoma, bilateral, severe stage: Secondary | ICD-10-CM | POA: Insufficient documentation

## 2019-08-13 DIAGNOSIS — H3570 Unspecified separation of retinal layers: Secondary | ICD-10-CM | POA: Insufficient documentation

## 2019-08-13 NOTE — Progress Notes (Signed)
08/13/2019     CHIEF COMPLAINT Patient presents for Retina Follow Up   HISTORY OF PRESENT ILLNESS: Lance Dillon is a 79 y.o. male who presents to the clinic today for:   HPI    Retina Follow Up    Patient presents with  Other.  In both eyes.  Duration of 1 year.  Since onset it is stable.          Comments    1 year follow up - OCT OU Patient denies change in vision and overall has no complaints.        Last edited by Gerda Diss on 08/13/2019  8:07 AM. (History)      Referring physician: Deland Pretty, MD [place],  [place] [id number]  HISTORICAL INFORMATION:   Selected notes from the MEDICAL RECORD NUMBER       CURRENT MEDICATIONS: Current Outpatient Medications (Ophthalmic Drugs)  Medication Sig  . latanoprost (XALATAN) 0.005 % ophthalmic solution Place 1 drop into the left eye at bedtime.    No current facility-administered medications for this visit. (Ophthalmic Drugs)   Current Outpatient Medications (Other)  Medication Sig  . acetaminophen (TYLENOL) 500 MG tablet Take 500 mg by mouth every 6 (six) hours as needed.  Marland Kitchen albuterol (VENTOLIN HFA) 108 (90 Base) MCG/ACT inhaler Inhale 2 puffs into the lungs every 6 (six) hours as needed for wheezing or shortness of breath.  Marland Kitchen amLODipine (NORVASC) 10 MG tablet Take 1 tablet (10 mg total) by mouth daily.  Marland Kitchen aspirin 81 MG tablet Take 81 mg by mouth daily.  . budesonide-formoterol (SYMBICORT) 80-4.5 MCG/ACT inhaler Inhale 2 puffs into the lungs 2 (two) times daily.  . Calcium Carbonate-Vitamin D (CALTRATE 600+D PO) Take 1 tablet by mouth daily.   . Cyanocobalamin (VITAMIN B 12 PO) Take 2,500 mg by mouth daily.  . fluticasone (FLONASE) 50 MCG/ACT nasal spray Place 2 sprays into both nostrils as needed (for nasal congestion).   . hydrochlorothiazide (HYDRODIURIL) 12.5 MG tablet TAKE 1 TABLET BY MOUTH EVERY DAY ALONG WITH LOSARTAN  . ibuprofen (ADVIL,MOTRIN) 200 MG tablet Take 200 mg by  mouth every 6 (six) hours as needed.  Marland Kitchen losartan (COZAAR) 100 MG tablet TAKE 1 TABLET BY MOUTH EVERY DAY ALONG WITH HCTZ  . LUTEIN PO Take 1 tablet by mouth daily.  . magnesium gluconate (MAGONATE) 500 MG tablet Take 500 mg by mouth 2 (two) times daily.  . metoprolol succinate (TOPROL-XL) 25 MG 24 hr tablet TAKE 1 TABLET (25 MG TOTAL) BY MOUTH DAILY. ** DO NOT CRUSH ** (BETA BLOCKER)  . Omega-3 Fatty Acids (FISH OIL) 1200 MG CAPS Take 3,600 mg by mouth daily.  . rosuvastatin (CRESTOR) 10 MG tablet TAKE 1 TABLET BY MOUTH EVERY DAY   No current facility-administered medications for this visit. (Other)      REVIEW OF SYSTEMS:    ALLERGIES Allergies  Allergen Reactions  . Avelox [Moxifloxacin Hcl In Nacl] Diarrhea    diarrhea  . Moxifloxacin Diarrhea    diarrhea    PAST MEDICAL HISTORY Past Medical History:  Diagnosis Date  . Arthritis   . Atherosclerosis of aortic bifurcation and common iliac arteries [facility]) February 2017    Noted on abdominal/renal Dopplers  . Blind right eye   . 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 Cataract   .  Colon polyps 2004, 07, 14  . COPD (chronic obstructive pulmonary disease) ([place])   . Glaucoma    left eye   . Heart murmur    No evidence of valvular lesion noted on Echo 01/2010  . Hx of radiation therapy 03/22/11 to 05/08/11   PSA recurrent carcinoma of prostate  . Hypercholesterolemia   . Hypertension   . Macular degeneration   . Macular degeneration of both eyes   . Peripheral vascular disease [facility]) June 2013   Bilateral SFA stenosis: Status post right femoropopliteal bypass - Dr. Kellie Simmering  . 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 Prostate cancer ([place]) 2004  . Renal artery stenosis (HCC)    70% left  . Stones in  the urinary tract   . Stroke ([place])   . Urinary incontinence    Past Surgical History:  Procedure Laterality Date  . ABDOMINAL AORTAGRAM N/A 06/20/2011   Procedure: ABDOMINAL Maxcine Ham;  Surgeon: Serafina Mitchell, MD;  Location: [facility] CATH LAB;  Service: Cardiovascular;  Laterality: N/A;  . CARDIOVASCULAR STRESS TEST  07/06/2011   Evidence of mild ischemia in Basal Inferior and Mid Inferior regions, no significant wall motion abnormalities noted.  . clamp to penis     for urinary control  . COLONOSCOPY    . COLONOSCOPY W/ BIOPSIES     multiple   . CYSTOSCOPY W/ URETERAL STENT PLACEMENT Left 06/07/2015   Procedure: CYSTOSCOPY, RETROGRADE PYLEGRAM  WITH STENT PLACEMENT;  Surgeon: Bjorn Loser, MD;  Location: [facility] ORS;  Service: Urology;  Laterality: Left;  . CYSTOSCOPY WITH RETROGRADE PYELOGRAM, URETEROSCOPY AND STENT PLACEMENT Left 07/28/2015   Procedure: CYSTOSCOPY, URETEROSCOPY, WITH BASKET STONE REMOVAL;  Surgeon: Raynelle Bring, MD;  Location: [facility] ORS;  Service: Urology;  Laterality: Left;  . EYE SURGERY Right Aug. 2016   Cataract; removed bilat  . FEMORAL-POPLITEAL BYPASS GRAFT  07/19/2011   Procedure: BYPASS GRAFT FEMORAL-POPLITEAL ARTERY;  Surgeon: Mal Misty, MD;  Location: [facility] OR;  Service: Vascular;  Laterality: Right;  Right Femoral - Popliteal  Bypss with saphenous vein  . HERNIA REPAIR     right side,lft  . INTRAOPERATIVE ARTERIOGRAM  07/19/2011   Procedure: INTRA OPERATIVE ARTERIOGRAM;  Surgeon: Mal Misty, MD;  Location: [place];  Service: Vascular;  Laterality: Right;  . NM MYOVIEW LTD  June 2013   Normal EF, very small area of basal-mid inferior ischemia; LOW RISK  . PENILE PROSTHESIS  REMOVAL    . PENILE PROSTHESIS IMPLANT    . PR VEIN BYPASS GRAFT,AORTO-FEM-POP  07/19/11   Right  Fem/Pop BPG  . PROSTATE BIOPSY    . prostatectomy retropubic radical  07/15/2002   with nerve sparing, Gleason 3+4=7  . Reclast  July 25, 2013  . RENAL DUPLEX  07/01/2012   Left renal artery  60-99% diameter reduction  . TRANSTHORACIC ECHOCARDIOGRAM  January   Normal EF and overall function  . TRANSTHORACIC ECHOCARDIOGRAM  02/23/2010   EF >32%, lv systolic function normal    FAMILY HISTORY Family History  Problem Relation Age of Onset  . Prostate cancer Brother 45  . Heart disease Brother        Before age 75  . Hypertension Brother   . Heart attack Brother   . Aneurysm Father   . Heart disease Father   . Hypertension Father   . Heart disease Mother        Before age 61  .  Hypertension Mother   . Heart attack Mother   . Heart disease Sister   . Hypertension Sister   . Heart attack Sister   . Colon cancer Neg Hx   . Esophageal cancer Neg Hx   . Stomach cancer Neg Hx   . Rectal cancer Neg Hx   . Colon polyps Neg Hx     SOCIAL HISTORY Social History   Tobacco Use  . Smoking status: Former Smoker    Packs/day: 3.00    Years: 30.00    Pack years: 90.00    Types: Cigarettes    Quit date: 07/01/2002    Years since quitting: 17.1  . Smokeless tobacco: Never Used  Vaping Use  . Vaping Use: Never used  Substance Use Topics  . Alcohol use: No  . Drug use: No         OPHTHALMIC EXAM:  Base Eye Exam    Visual Acuity (Snellen - Linear)      Right Left   Dist cc CF @ 1' 20/100-2   Dist ph cc NI NI   Correction: Glasses       Tonometry (Tonopen, 8:12 AM)      Right Left   Pressure 19 15       Pupils      Pupils Dark Light Shape React APD   Right PERRL 3 3 Round Minimal None   Left PERRL 4 4 Round Minimal None       Visual Fields (Counting fingers)      Left Right    Full Full       Extraocular Movement      Right Left    Full Full       Neuro/Psych    Oriented x3: Yes   Mood/Affect: Normal       Dilation    Both eyes: 2.5% Phenylephrine, 1.0% Mydriacyl @ 8:12 AM        Slit Lamp and Fundus Exam    External Exam      Right Left   External Normal Normal       Slit Lamp Exam      Right Left   Lids/Lashes Normal Normal    Conjunctiva/Sclera White and quiet White and quiet   Cornea Clear Clear   Anterior Chamber Deep and quiet Deep and quiet   Iris Round and reactive Round and reactive   Lens Centered posterior chamber intraocular lens Centered posterior chamber intraocular lens   Anterior Vitreous Normal Normal       Fundus Exam      Right Left   Posterior Vitreous Posterior vitreous detachment Posterior vitreous detachment   Disc Normal Normal   C/D Ratio 0.55 0.9   Macula Geographic atrophy, Pigmented atrophy, Disciform scar Geographic atrophy, Pigmented atrophy, Disciform scar   Vessels Normal Normal   Periphery Normal Normal          IMAGING AND PROCEDURES  Imaging and Procedures for 08/13/19  OCT, Retina - OU - Both Eyes       Right Eye Quality was good. Scan locations included subfoveal. Central Foveal Thickness: 263. Progression has been stable. Findings include abnormal foveal contour, no IRF, no SRF, central retinal atrophy, outer retinal atrophy, inner retinal atrophy.   Left Eye Quality was good. Scan locations included subfoveal. Central Foveal Thickness: 235. Progression has been stable. Findings include abnormal foveal contour, central retinal atrophy, outer retinal atrophy, inner retinal atrophy, no IRF, no SRF.  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 The results of the AREDS 2 study was discussed with the patient. A diet rich in dark leafy green vegetables was advised and specific recommendations were made regarding supplements with AREDS 2 formulation . Control of hypertension and serum cholesterol may slow the disease. Smoking cessation is mandatory to slow the disease and diminish the risk of progressing to wet age related macular degeneration. The patient was  instructed in the use of an [place] and was told to return immediately for any changes in the Grid. Stressed to the patient do not rub eyes  Posterior vitreous detachment of left eye   The nature of posterior vitreous detachment was discussed with the patient as well as its physiology, its age prevalence, and its possible implication regarding retinal breaks and detachment.  An informational brochure was given to the patient.  All the patient's questions were answered.  The patient was asked to return if new or different flashes or floaters develops.   Patient was instructed to contact office immediately if any changes were noticed. I explained to the patient that vitreous inside the eye is similar to jello inside a bowl. As the jello melts it can start to pull away from the bowl, similarly the vitreous throughout our lives can begin to pull away from the retina. That process is called a posterior vitreous detachment. In some cases, the vitreous can tug hard enough on the retina to form a retinal tear. I discussed with the patient the signs and symptoms of a retinal detachment.  Do not rub the eye.      ICD-10-CM   1. 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 Posterior vitreous detachment of left eye  H43.812 OCT, Retina - OU - Both Eyes  3. Retinal layer separation  H35.70   4. Primary open angle glaucoma of both eyes, severe stage  H40.1133     1.  Patient is scheduled to follow-up with Dr. Craig Guess and monitoring and management of his open angle glaucoma.  2.  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  Ophthalmic Meds Ordered this visit:  No orders of the defined types were placed in this encounter.      Return in about 2 years (around 08/12/2021) for DILATE OU, COLOR FP, OCT.  There are no Patient Instructions on  file for this visit.   Explained the diagnoses, plan, and follow up with the patient and they expressed understanding.  Patient expressed understanding of the importance of proper follow up care.   Clent Demark Franchelle Foskett M.D. Diseases & Surgery of the Retina and Vitreous [facility] [place] 08/13/19     Abbreviations: M myopia (nearsighted); A astigmatism; H hyperopia (farsighted); P presbyopia; Mrx spectacle prescription;  CTL contact lenses; OD right eye; OS left eye; OU both eyes  XT exotropia; ET esotropia; PEK punctate epithelial keratitis; PEE punctate epithelial erosions; DES dry eye syndrome; MGD meibomian gland dysfunction; ATs artificial tears; PFAT's preservative free artificial tears; [place] nuclear sclerotic cataract; PSC posterior subcapsular cataract; ERM epi-retinal membrane; PVD posterior vitreous detachment; RD retinal detachment; DM diabetes mellitus; DR diabetic retinopathy; NPDR non-proliferative diabetic retinopathy; PDR proliferative diabetic retinopathy; CSME  clinically significant macular edema; DME diabetic macular edema; dbh dot blot hemorrhages; CWS cotton wool spot; POAG primary open angle glaucoma; C/D cup-to-disc ratio; HVF humphrey visual field; GVF goldmann visual field; OCT optical coherence tomography; IOP intraocular pressure; BRVO Branch retinal vein occlusion; CRVO central retinal vein occlusion; CRAO central retinal artery occlusion; BRAO branch retinal artery occlusion; RT retinal tear; SB scleral buckle; PPV pars plana vitrectomy; VH Vitreous hemorrhage; PRP panretinal laser photocoagulation; IVK intravitreal kenalog; VMT vitreomacular traction; MH Macular hole;  NVD neovascularization of the disc; NVE neovascularization elsewhere; AREDS age related eye disease study; ARMD age related macular degeneration; POAG primary open angle glaucoma; EBMD epithelial/anterior basement membrane dystrophy; ACIOL anterior chamber intraocular lens; IOL intraocular lens; PCIOL  posterior chamber intraocular lens; Phaco/IOL phacoemulsification with intraocular lens placement; [place] photorefractive keratectomy; LASIK laser assisted in situ keratomileusis; HTN hypertension; DM diabetes mellitus; COPD chronic obstructive pulmonary disease

## 2019-08-13 NOTE — Assessment & Plan Note (Signed)

## 2019-08-13 NOTE — Assessment & Plan Note (Signed)

## 2019-08-15 DIAGNOSIS — R262 Difficulty in walking, not elsewhere classified: Secondary | ICD-10-CM | POA: Diagnosis not present

## 2019-08-15 DIAGNOSIS — M545 Low back pain: Secondary | ICD-10-CM | POA: Diagnosis not present

## 2019-08-20 DIAGNOSIS — M545 Low back pain: Secondary | ICD-10-CM | POA: Diagnosis not present

## 2019-08-20 DIAGNOSIS — R262 Difficulty in walking, not elsewhere classified: Secondary | ICD-10-CM | POA: Diagnosis not present

## 2019-08-22 DIAGNOSIS — R262 Difficulty in walking, not elsewhere classified: Secondary | ICD-10-CM | POA: Diagnosis not present

## 2019-08-22 DIAGNOSIS — M545 Low back pain: Secondary | ICD-10-CM | POA: Diagnosis not present

## 2019-08-26 ENCOUNTER — Ambulatory Visit (HOSPITAL_COMMUNITY)
Admission: RE | Admit: 2019-08-26 | Discharge: 2019-08-26 | Disposition: A | Discharge status: Home / Self Care | Payer: Medicare HMO | Source: Ambulatory Visit | Attending: Interventional Radiology | Admitting: Interventional Radiology

## 2019-08-26 ENCOUNTER — Other Ambulatory Visit: Payer: Self-pay

## 2019-08-26 DIAGNOSIS — I771 Stricture of artery: Secondary | ICD-10-CM | POA: Diagnosis not present

## 2019-08-26 DIAGNOSIS — I725 Aneurysm of other precerebral arteries: Secondary | ICD-10-CM | POA: Diagnosis not present

## 2019-08-26 DIAGNOSIS — I6523 Occlusion and stenosis of bilateral carotid arteries: Secondary | ICD-10-CM | POA: Diagnosis not present

## 2019-08-26 DIAGNOSIS — I671 Cerebral aneurysm, nonruptured: Secondary | ICD-10-CM | POA: Insufficient documentation

## 2019-08-27 DIAGNOSIS — R262 Difficulty in walking, not elsewhere classified: Secondary | ICD-10-CM | POA: Diagnosis not present

## 2019-08-27 DIAGNOSIS — M545 Low back pain: Secondary | ICD-10-CM | POA: Diagnosis not present

## 2019-08-29 ENCOUNTER — Telehealth (HOSPITAL_COMMUNITY): Payer: Self-pay | Admitting: Radiology

## 2019-08-29 DIAGNOSIS — R262 Difficulty in walking, not elsewhere classified: Secondary | ICD-10-CM | POA: Diagnosis not present

## 2019-08-29 DIAGNOSIS — M545 Low back pain: Secondary | ICD-10-CM | POA: Diagnosis not present

## 2019-08-29 NOTE — Telephone Encounter (Signed)
Called pt, left VM that per Dr. Estanislado Pandy his recent MRA scan was stable and to follow-up with MRA head wo in 1 yrs time. JM

## 2019-09-02 DIAGNOSIS — I10 Essential (primary) hypertension: Secondary | ICD-10-CM | POA: Diagnosis not present

## 2019-09-02 DIAGNOSIS — M810 Age-related osteoporosis without current pathological fracture: Secondary | ICD-10-CM | POA: Diagnosis not present

## 2019-09-02 DIAGNOSIS — M8589 Other specified disorders of bone density and structure, multiple sites: Secondary | ICD-10-CM | POA: Diagnosis not present

## 2019-09-03 DIAGNOSIS — R262 Difficulty in walking, not elsewhere classified: Secondary | ICD-10-CM | POA: Diagnosis not present

## 2019-09-03 DIAGNOSIS — M545 Low back pain: Secondary | ICD-10-CM | POA: Diagnosis not present

## 2019-09-05 DIAGNOSIS — M545 Low back pain: Secondary | ICD-10-CM | POA: Diagnosis not present

## 2019-09-05 DIAGNOSIS — R262 Difficulty in walking, not elsewhere classified: Secondary | ICD-10-CM | POA: Diagnosis not present

## 2019-09-09 DIAGNOSIS — Z961 Presence of intraocular lens: Secondary | ICD-10-CM | POA: Diagnosis not present

## 2019-09-09 DIAGNOSIS — H5213 Myopia, bilateral: Secondary | ICD-10-CM | POA: Diagnosis not present

## 2019-09-09 DIAGNOSIS — H353133 Nonexudative age-related macular degeneration, bilateral, advanced atrophic without subfoveal involvement: Secondary | ICD-10-CM | POA: Diagnosis not present

## 2019-09-09 DIAGNOSIS — H524 Presbyopia: Secondary | ICD-10-CM | POA: Diagnosis not present

## 2019-09-10 DIAGNOSIS — R262 Difficulty in walking, not elsewhere classified: Secondary | ICD-10-CM | POA: Diagnosis not present

## 2019-09-10 DIAGNOSIS — M545 Low back pain: Secondary | ICD-10-CM | POA: Diagnosis not present

## 2019-09-12 DIAGNOSIS — M545 Low back pain: Secondary | ICD-10-CM | POA: Diagnosis not present

## 2019-09-12 DIAGNOSIS — R262 Difficulty in walking, not elsewhere classified: Secondary | ICD-10-CM | POA: Diagnosis not present

## 2019-09-13 DIAGNOSIS — J439 Emphysema, unspecified: Secondary | ICD-10-CM | POA: Diagnosis not present

## 2019-09-17 DIAGNOSIS — R262 Difficulty in walking, not elsewhere classified: Secondary | ICD-10-CM | POA: Diagnosis not present

## 2019-09-17 DIAGNOSIS — M545 Low back pain: Secondary | ICD-10-CM | POA: Diagnosis not present

## 2019-09-18 DIAGNOSIS — C61 Malignant neoplasm of prostate: Secondary | ICD-10-CM | POA: Diagnosis not present

## 2019-09-19 DIAGNOSIS — M545 Low back pain: Secondary | ICD-10-CM | POA: Diagnosis not present

## 2019-09-19 DIAGNOSIS — R262 Difficulty in walking, not elsewhere classified: Secondary | ICD-10-CM | POA: Diagnosis not present

## 2019-09-24 DIAGNOSIS — M545 Low back pain: Secondary | ICD-10-CM | POA: Diagnosis not present

## 2019-09-24 DIAGNOSIS — R262 Difficulty in walking, not elsewhere classified: Secondary | ICD-10-CM | POA: Diagnosis not present

## 2019-09-25 ENCOUNTER — Other Ambulatory Visit: Payer: Self-pay | Admitting: Cardiovascular Disease

## 2019-09-25 NOTE — Telephone Encounter (Signed)
Rx has been sent to the pharmacy electronically. ° °

## 2019-09-26 DIAGNOSIS — M545 Low back pain: Secondary | ICD-10-CM | POA: Diagnosis not present

## 2019-09-26 DIAGNOSIS — R262 Difficulty in walking, not elsewhere classified: Secondary | ICD-10-CM | POA: Diagnosis not present

## 2019-09-29 ENCOUNTER — Other Ambulatory Visit (HOSPITAL_COMMUNITY): Payer: Self-pay | Admitting: Urology

## 2019-09-29 DIAGNOSIS — C61 Malignant neoplasm of prostate: Secondary | ICD-10-CM

## 2019-10-01 DIAGNOSIS — M545 Low back pain: Secondary | ICD-10-CM | POA: Diagnosis not present

## 2019-10-01 DIAGNOSIS — R262 Difficulty in walking, not elsewhere classified: Secondary | ICD-10-CM | POA: Diagnosis not present

## 2019-10-07 ENCOUNTER — Ambulatory Visit (HOSPITAL_COMMUNITY): Admission: RE | Admit: 2019-10-07 | Payer: Medicare HMO | Source: Ambulatory Visit

## 2019-10-08 DIAGNOSIS — R262 Difficulty in walking, not elsewhere classified: Secondary | ICD-10-CM | POA: Diagnosis not present

## 2019-10-08 DIAGNOSIS — M545 Low back pain: Secondary | ICD-10-CM | POA: Diagnosis not present

## 2019-10-15 ENCOUNTER — Ambulatory Visit (HOSPITAL_COMMUNITY)
Admission: RE | Admit: 2019-10-15 | Discharge: 2019-10-15 | Disposition: A | Discharge status: Home / Self Care | Payer: Medicare HMO | Source: Ambulatory Visit | Attending: Urology | Admitting: Urology

## 2019-10-15 ENCOUNTER — Other Ambulatory Visit: Payer: Self-pay

## 2019-10-15 DIAGNOSIS — I700 Atherosclerosis of aorta: Secondary | ICD-10-CM | POA: Diagnosis not present

## 2019-10-15 DIAGNOSIS — I2510 Atherosclerotic heart disease of native coronary artery without angina pectoris: Secondary | ICD-10-CM | POA: Diagnosis not present

## 2019-10-15 DIAGNOSIS — C61 Malignant neoplasm of prostate: Secondary | ICD-10-CM | POA: Diagnosis not present

## 2019-10-15 DIAGNOSIS — C778 Secondary and unspecified malignant neoplasm of lymph nodes of multiple regions: Secondary | ICD-10-CM | POA: Diagnosis not present

## 2019-10-15 MED ORDER — AXUMIN (FLUCICLOVINE F 18) INJECTION
9.7000 | Freq: Once | INTRAVENOUS | Status: AC
  Administered 2019-10-15: 9.7 via INTRAVENOUS

## 2019-10-24 DIAGNOSIS — Z23 Encounter for immunization: Secondary | ICD-10-CM | POA: Diagnosis not present

## 2019-10-27 DIAGNOSIS — C61 Malignant neoplasm of prostate: Secondary | ICD-10-CM | POA: Diagnosis not present

## 2019-10-27 DIAGNOSIS — Z5111 Encounter for antineoplastic chemotherapy: Secondary | ICD-10-CM | POA: Diagnosis not present

## 2019-11-06 DIAGNOSIS — R69 Illness, unspecified: Secondary | ICD-10-CM | POA: Diagnosis not present

## 2019-11-26 DIAGNOSIS — G8929 Other chronic pain: Secondary | ICD-10-CM | POA: Diagnosis not present

## 2019-11-26 DIAGNOSIS — M810 Age-related osteoporosis without current pathological fracture: Secondary | ICD-10-CM | POA: Diagnosis not present

## 2019-11-26 DIAGNOSIS — C61 Malignant neoplasm of prostate: Secondary | ICD-10-CM | POA: Diagnosis not present

## 2019-11-26 DIAGNOSIS — Z7982 Long term (current) use of aspirin: Secondary | ICD-10-CM | POA: Diagnosis not present

## 2019-11-26 DIAGNOSIS — H3530 Unspecified macular degeneration: Secondary | ICD-10-CM | POA: Diagnosis not present

## 2019-11-26 DIAGNOSIS — E785 Hyperlipidemia, unspecified: Secondary | ICD-10-CM | POA: Diagnosis not present

## 2019-11-26 DIAGNOSIS — Z008 Encounter for other general examination: Secondary | ICD-10-CM | POA: Diagnosis not present

## 2019-11-26 DIAGNOSIS — I10 Essential (primary) hypertension: Secondary | ICD-10-CM | POA: Diagnosis not present

## 2019-11-26 DIAGNOSIS — I70209 Unspecified atherosclerosis of native arteries of extremities, unspecified extremity: Secondary | ICD-10-CM | POA: Diagnosis not present

## 2019-11-26 DIAGNOSIS — N393 Stress incontinence (female) (male): Secondary | ICD-10-CM | POA: Diagnosis not present

## 2019-11-26 DIAGNOSIS — H409 Unspecified glaucoma: Secondary | ICD-10-CM | POA: Diagnosis not present

## 2019-12-15 ENCOUNTER — Other Ambulatory Visit (HOSPITAL_COMMUNITY): Payer: Self-pay | Admitting: Cardiovascular Disease

## 2019-12-15 ENCOUNTER — Ambulatory Visit (HOSPITAL_BASED_OUTPATIENT_CLINIC_OR_DEPARTMENT_OTHER)
Admission: RE | Admit: 2019-12-15 | Discharge: 2019-12-15 | Disposition: A | Discharge status: Home / Self Care | Payer: Medicare HMO | Source: Ambulatory Visit | Attending: Cardiovascular Disease | Admitting: Cardiovascular Disease

## 2019-12-15 ENCOUNTER — Ambulatory Visit (HOSPITAL_COMMUNITY)
Admission: RE | Admit: 2019-12-15 | Discharge: 2019-12-15 | Disposition: A | Discharge status: Home / Self Care | Payer: Medicare HMO | Source: Ambulatory Visit | Attending: Internal Medicine | Admitting: Internal Medicine

## 2019-12-15 ENCOUNTER — Other Ambulatory Visit: Payer: Self-pay

## 2019-12-15 DIAGNOSIS — I739 Peripheral vascular disease, unspecified: Secondary | ICD-10-CM | POA: Insufficient documentation

## 2019-12-15 DIAGNOSIS — I701 Atherosclerosis of renal artery: Secondary | ICD-10-CM | POA: Insufficient documentation

## 2019-12-15 DIAGNOSIS — I6523 Occlusion and stenosis of bilateral carotid arteries: Secondary | ICD-10-CM | POA: Diagnosis not present

## 2019-12-15 DIAGNOSIS — Z9862 Peripheral vascular angioplasty status: Secondary | ICD-10-CM

## 2019-12-15 DIAGNOSIS — Z95828 Presence of other vascular implants and grafts: Secondary | ICD-10-CM

## 2019-12-22 DIAGNOSIS — C61 Malignant neoplasm of prostate: Secondary | ICD-10-CM | POA: Diagnosis not present

## 2020-01-07 ENCOUNTER — Other Ambulatory Visit: Payer: Self-pay | Admitting: Cardiovascular Disease

## 2020-01-20 ENCOUNTER — Other Ambulatory Visit: Payer: Self-pay | Admitting: Cardiovascular Disease

## 2020-01-30 DIAGNOSIS — R051 Acute cough: Secondary | ICD-10-CM | POA: Diagnosis not present

## 2020-01-30 DIAGNOSIS — Z03818 Encounter for observation for suspected exposure to other biological agents ruled out: Secondary | ICD-10-CM | POA: Diagnosis not present

## 2020-02-24 ENCOUNTER — Encounter: Payer: Self-pay | Admitting: Cardiovascular Disease

## 2020-02-24 ENCOUNTER — Ambulatory Visit: Payer: Medicare HMO | Admitting: Cardiovascular Disease

## 2020-02-24 ENCOUNTER — Other Ambulatory Visit: Payer: Self-pay

## 2020-02-24 DIAGNOSIS — I70213 Atherosclerosis of native arteries of extremities with intermittent claudication, bilateral legs: Secondary | ICD-10-CM | POA: Diagnosis not present

## 2020-02-24 DIAGNOSIS — I701 Atherosclerosis of renal artery: Secondary | ICD-10-CM | POA: Diagnosis not present

## 2020-02-24 DIAGNOSIS — E785 Hyperlipidemia, unspecified: Secondary | ICD-10-CM | POA: Diagnosis not present

## 2020-02-24 DIAGNOSIS — I10 Essential (primary) hypertension: Secondary | ICD-10-CM

## 2020-02-24 MED ORDER — CILOSTAZOL 50 MG PO TABS: 50.0000 mg | ORAL_TABLET | Freq: Two times a day (BID) | ORAL | 3 refills | Status: DC

## 2020-02-24 NOTE — Assessment & Plan Note (Signed)
History of hyperlipidemia on statin therapy with lipid profile measured 07/22/2019 revealing total cholesterol 126, LDL of 55 and HDL 44.

## 2020-02-24 NOTE — Assessment & Plan Note (Signed)
History of essential hypertension a blood pressure measured today 102/58.  He is on amlodipine, hydrochlorothiazide, and losartan as well as metoprolol.

## 2020-02-24 NOTE — Patient Instructions (Signed)
Medication Instructions:  Start taking Pletal (cilostazol) 50mg  twice daily.   *If you need a refill on your cardiac medications before your next appointment, please call your pharmacy*   Testing/Procedures: Your physician has requested that you have a lower extremity arterial duplex. This test is an ultrasound of the arteries in the legs. It looks at arterial blood flow in the legs. Allow one hour for Lower and Upper Arterial scans. There are no restrictions or special instructions.   Your physician has requested that you have an ankle brachial index (ABI). During this test an ultrasound and blood pressure cuff are used to evaluate the arteries that supply the arms and legs with blood. Allow thirty minutes for this exam. There are no restrictions or special instructions. This procedure will be done: [place]. 2nd Floor.    Follow-Up: At [facility], you and your health needs are our priority.  As part of our continuing mission to provide you with exceptional heart care, we have created designated Provider Care Teams.  These Care Teams include your primary Cardiologist (physician) and Advanced Practice Providers (APPs -  Physician Assistants and Nurse Practitioners) who all work together to provide you with the care you need, when you need it.  We recommend signing up for the patient portal called "MyChart".  Sign up information is provided on this After Visit Summary.  MyChart is used to connect with patients for Virtual Visits (Telemedicine).  Patients are able to view lab/test results, encounter notes, upcoming appointments, etc.  Non-urgent messages can be sent to your provider as well.   To learn more about what you can do with MyChart, go to [email or web address].    Your next appointment:   4 month(s)  The format for your next appointment:   In Person  Provider:   Quay Burow, MD

## 2020-02-24 NOTE — Progress Notes (Signed)
02/24/2020 [name]   [birth year]  [id number]  Primary Physician Deland Pretty, MD Primary Cardiologist: Lorretta Harp MD Lance Dillon, [place], [place]  HPI:  Lance Dillon is a 80 y.o.  formally a patient of Dr. Adonis Dillon the past, then Dr. Ellyn Dillon and currently I am care of his heart and peripheral vasculature.I last saw him in the office  12/23/2018.He has no prior cardiac history. He does have a history of hypertension and hyperlipidemia. He had angiography performed by Dr. Trula Dillon 06/20/11 the setting of right lower extremely claudication. At the time of angiography he had demonstrated 70% left renal artery stenosis. He ultimately underwent right femoropopliteal bypass grafting by Dr. Kellie Dillon. Following his renal Doppler studies semiannually which have demonstrated a mild progressive decline in renal dimensions as well as increase in velocities. His left renal dimension is still 10.9 cm. 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 His most recent renal Doppler studies performed 04/19/16 revealed a left renal aortic ratio of 6.64 with a left renal dimension that had decreased to 1 cm.He denies chest pain or shortness of breath since I saw him last.     His most recent renal Dopplers performed 11/21/2018 revealed an occluded left renal artery, small left kidney with a  compensated right renal dimension 10.8 cm.  Carotid Dopplers performed at the same time showed known occluded bilateral ICAs.  He does have a posterior circulation aneurysm.  Since I saw him a year ago he continues to do well.  He denies chest pain or shortness of breath.  He does complain of bilateral calf claudication left greater than right which is lifestyle limiting.  Recent lower extremity ABIs performed 12/15/2019 were in the 0.65 range bilaterally.   Current Meds   Medication Sig  . acetaminophen (TYLENOL) 500 MG tablet Take 500 mg by mouth every 6 (six) hours as needed.  Marland Kitchen amLODipine (NORVASC) 10 MG tablet TAKE 1 TABLET BY MOUTH EVERY DAY  . aspirin 81 MG tablet Take 81 mg by mouth daily.  . Calcium Carbonate-Vitamin D (CALTRATE 600+D PO) Take 1 tablet by mouth daily.   . Cyanocobalamin (VITAMIN B 12 PO) Take 2,500 mg by mouth daily.  . fluticasone (FLONASE) 50 MCG/ACT nasal spray Place 2 sprays into both nostrils as needed (for nasal congestion).   . hydrochlorothiazide (HYDRODIURIL) 12.5 MG tablet TAKE 1 TABLET BY MOUTH EVERY DAY ALONG WITH LOSARTAN  . ibuprofen (ADVIL,MOTRIN) 200 MG tablet Take 200 mg by mouth every 6 (six) hours as needed.  . latanoprost (XALATAN) 0.005 % ophthalmic solution Place 1 drop into the left eye at bedtime.   Marland Kitchen losartan (COZAAR) 100 MG tablet TAKE 1 TABLET BY MOUTH EVERY DAY ALONG WITH HCTZ  . LUTEIN PO Take 1 tablet by mouth daily.  . metoprolol succinate (TOPROL-XL) 25 MG 24 hr tablet TAKE 1 TABLET (25 MG TOTAL) BY MOUTH DAILY. ** DO NOT CRUSH ** (BETA BLOCKER)  . Omega-3 Fatty Acids (FISH OIL) 1200 MG CAPS Take 3,600 mg by mouth daily.  . rosuvastatin (CRESTOR) 10 MG tablet TAKE 1 TABLET BY MOUTH EVERY DAY     Allergies  Allergen Reactions  . Avelox [Moxifloxacin Hcl In Nacl] Diarrhea    diarrhea  . Moxifloxacin Diarrhea    diarrhea    Social History   Socioeconomic History  . Marital status: Married    Spouse  name: Not on file  . Number of children: Not on file  . Years of education: Not on file  . Highest education level: Not on file  Occupational History  . Occupation: Therapist, occupational: [employer]  Tobacco Use  . Smoking status: Former Smoker    Packs/day: 3.00    Years: 30.00    Pack years: 90.00    Types: Cigarettes    Quit date: 07/01/2002    Years since quitting: 17.6  . Smokeless tobacco: Never Used  Vaping Use  . Vaping Use: Never used  Substance and Sexual Activity  .  Alcohol use: No  . Drug use: No  . Sexual activity: Not on file  Other Topics Concern  . 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   Usually walks 2 miles maybe 2-3 days a week. Just not a cold weather.   He is a former smoker, quit "cold [place] "after 3 pack per day for 60 pack years in 2004   Social Determinants of Health   Financial Resource Strain: Not on file  Food Insecurity: Not on file  Transportation Needs: Not on file  Physical Activity: Not on file  Stress: Not on file  Social Connections: Not on file  Intimate Partner Violence: Not on file     Review of Systems: General: negative for chills, fever, night sweats or weight changes.  Cardiovascular: negative for chest pain, dyspnea on exertion, edema, orthopnea, palpitations, paroxysmal nocturnal dyspnea or shortness of breath Dermatological: negative for rash Respiratory: negative for cough or wheezing Urologic: negative for hematuria Abdominal: negative for nausea, vomiting, diarrhea, bright red blood per rectum, melena, or hematemesis Neurologic: negative for visual changes, syncope, or dizziness All other systems reviewed and are otherwise negative except as noted above.    Blood pressure (!) 102/58, pulse 89, height 5\' 8"  (1.727 m), weight 168 lb (76.2 kg).  General appearance: alert and no distress Neck: no adenopathy, no carotid bruit, no JVD, supple, symmetrical, trachea midline and thyroid not enlarged, symmetric, no tenderness/mass/nodules Lungs: clear to auscultation bilaterally Heart: regular rate and rhythm, S1, S2 normal, no murmur, click, rub or gallop Extremities: extremities normal, atraumatic, no cyanosis or edema Pulses: Diminished pedal pulses bilaterally Skin: Skin color, texture, turgor normal. No rashes or lesions Neurologic: Alert and oriented X 3, normal strength and tone.  Normal symmetric reflexes. Normal coordination and gait  EKG sinus rhythm at 89 with nonspecific ST and T wave changes.  Personally reviewed this EKG.  ASSESSMENT AND PLAN:   Atherosclerosis of native artery of extremity with intermittent claudication (HCC) History of PAD status post right femoropopliteal bypass grafting by Dr. Kellie Dillon with right external iliac and common femoral as well as profunda femoris endarterectomy with patch angioplasty 07/19/2011.  He does complain of left greater than right lower extremity lifestyle of any claudication.  Recent Doppler studies performed 12/15/2019 revealed ABIs in the mid 0.6 range bilaterally.  And then again duplex ultrasound.  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  I am hesitant to recommend endovascular therapy that might require dual antiplatelet therapy given this.  I am going to begin him on Pletal for a empiric trial of 3 months.  Left renal artery stenosis - 70% by angiography; increased velocity on recent Dopplers (60-99%) Left renal artery stenosis which was initially in  the 70% range which ultimately became occluded with duplex ultrasound performed 12/17/2019 revealing a small left kidney measuring 7.33 cm and a larger right kidney measuring 10.8 cm.  His blood pressure is under good control and his renal function is relatively well-preserved as well.  Essential hypertension History of essential hypertension a blood pressure measured today 102/58.  He is on amlodipine, hydrochlorothiazide, and losartan as well as metoprolol.  Hyperlipidemia LDL goal <70 History of hyperlipidemia on statin therapy with lipid profile measured 07/22/2019 revealing total cholesterol 126, LDL of 55 and HDL 44.      Lorretta Harp MD FACP,FACC,FAHA, [facility] 02/24/2020 9:56 AM

## 2020-02-24 NOTE — Assessment & Plan Note (Signed)
Left renal artery stenosis which was initially in the 70% range which ultimately became occluded with duplex ultrasound performed 12/17/2019 revealing a small left kidney measuring 7.33 cm and a larger right kidney measuring 10.8 cm.  His blood pressure is under good control and his renal function is relatively well-preserved as well.

## 2020-02-24 NOTE — Assessment & Plan Note (Signed)
History of PAD status post right femoropopliteal bypass grafting by Dr. Kellie Simmering with right external iliac and common femoral as well as profunda femoris endarterectomy with patch angioplasty 07/19/2011.  He does complain of left greater than right lower extremity lifestyle of any claudication.  Recent Doppler studies performed 12/15/2019 revealed ABIs in the mid 0.6 range bilaterally.  And then again duplex ultrasound.  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  I am hesitant to recommend endovascular therapy that might require dual antiplatelet therapy given this.  I am going to begin him on Pletal for a empiric trial of 3 months.

## 2020-03-03 ENCOUNTER — Ambulatory Visit (HOSPITAL_COMMUNITY)
Admission: RE | Admit: 2020-03-03 | Discharge: 2020-03-03 | Disposition: A | Discharge status: Home / Self Care | Payer: Medicare HMO | Source: Ambulatory Visit | Attending: Cardiovascular Disease | Admitting: Cardiovascular Disease

## 2020-03-03 ENCOUNTER — Other Ambulatory Visit: Payer: Self-pay

## 2020-03-03 DIAGNOSIS — I70213 Atherosclerosis of native arteries of extremities with intermittent claudication, bilateral legs: Secondary | ICD-10-CM | POA: Diagnosis not present

## 2020-03-11 DIAGNOSIS — L03031 Cellulitis of right toe: Secondary | ICD-10-CM | POA: Diagnosis not present

## 2020-03-11 DIAGNOSIS — B351 Tinea unguium: Secondary | ICD-10-CM | POA: Diagnosis not present

## 2020-03-11 DIAGNOSIS — M792 Neuralgia and neuritis, unspecified: Secondary | ICD-10-CM | POA: Diagnosis not present

## 2020-03-11 DIAGNOSIS — I70293 Other atherosclerosis of native arteries of extremities, bilateral legs: Secondary | ICD-10-CM | POA: Diagnosis not present

## 2020-03-11 DIAGNOSIS — L02611 Cutaneous abscess of right foot: Secondary | ICD-10-CM | POA: Diagnosis not present

## 2020-03-11 DIAGNOSIS — M79674 Pain in right toe(s): Secondary | ICD-10-CM | POA: Diagnosis not present

## 2020-03-15 DIAGNOSIS — J439 Emphysema, unspecified: Secondary | ICD-10-CM | POA: Diagnosis not present

## 2020-03-16 DIAGNOSIS — B351 Tinea unguium: Secondary | ICD-10-CM | POA: Diagnosis not present

## 2020-03-16 DIAGNOSIS — H353133 Nonexudative age-related macular degeneration, bilateral, advanced atrophic without subfoveal involvement: Secondary | ICD-10-CM | POA: Diagnosis not present

## 2020-03-16 DIAGNOSIS — H401122 Primary open-angle glaucoma, left eye, moderate stage: Secondary | ICD-10-CM | POA: Diagnosis not present

## 2020-03-22 DIAGNOSIS — C61 Malignant neoplasm of prostate: Secondary | ICD-10-CM | POA: Diagnosis not present

## 2020-03-25 DIAGNOSIS — L03031 Cellulitis of right toe: Secondary | ICD-10-CM | POA: Diagnosis not present

## 2020-03-25 DIAGNOSIS — B351 Tinea unguium: Secondary | ICD-10-CM | POA: Diagnosis not present

## 2020-03-26 DIAGNOSIS — C61 Malignant neoplasm of prostate: Secondary | ICD-10-CM | POA: Diagnosis not present

## 2020-03-26 DIAGNOSIS — N393 Stress incontinence (female) (male): Secondary | ICD-10-CM | POA: Diagnosis not present

## 2020-03-26 DIAGNOSIS — R3121 Asymptomatic microscopic hematuria: Secondary | ICD-10-CM | POA: Diagnosis not present

## 2020-03-29 DIAGNOSIS — N1831 Chronic kidney disease, stage 3a: Secondary | ICD-10-CM | POA: Diagnosis not present

## 2020-03-29 DIAGNOSIS — M810 Age-related osteoporosis without current pathological fracture: Secondary | ICD-10-CM | POA: Diagnosis not present

## 2020-03-29 DIAGNOSIS — I129 Hypertensive chronic kidney disease with stage 1 through stage 4 chronic kidney disease, or unspecified chronic kidney disease: Secondary | ICD-10-CM | POA: Diagnosis not present

## 2020-04-05 DIAGNOSIS — M79674 Pain in right toe(s): Secondary | ICD-10-CM | POA: Diagnosis not present

## 2020-04-05 DIAGNOSIS — M792 Neuralgia and neuritis, unspecified: Secondary | ICD-10-CM | POA: Diagnosis not present

## 2020-04-05 DIAGNOSIS — L03031 Cellulitis of right toe: Secondary | ICD-10-CM | POA: Diagnosis not present

## 2020-04-05 DIAGNOSIS — B351 Tinea unguium: Secondary | ICD-10-CM | POA: Diagnosis not present

## 2020-04-16 DIAGNOSIS — M5416 Radiculopathy, lumbar region: Secondary | ICD-10-CM | POA: Diagnosis not present

## 2020-04-16 DIAGNOSIS — M25552 Pain in left hip: Secondary | ICD-10-CM | POA: Diagnosis not present

## 2020-04-16 DIAGNOSIS — M1711 Unilateral primary osteoarthritis, right knee: Secondary | ICD-10-CM | POA: Diagnosis not present

## 2020-04-16 DIAGNOSIS — M5459 Other low back pain: Secondary | ICD-10-CM | POA: Diagnosis not present

## 2020-04-20 ENCOUNTER — Other Ambulatory Visit: Payer: Self-pay | Admitting: Cardiovascular Disease

## 2020-04-20 ENCOUNTER — Telehealth: Payer: Self-pay | Admitting: Cardiovascular Disease

## 2020-04-20 NOTE — Telephone Encounter (Signed)
Please have Pharm D call pt to discuss  JJB

## 2020-04-20 NOTE — Telephone Encounter (Signed)
Spoke to pt who state on 1/25 Dr. Gwenlyn Found started him on cilostazol 50 mg BID. Pt state a couple days after starting medication, he began to have loose stools. Pt then decrease medication to 50 mg daily. He report symptoms were better but still present and sometimes he's unable to control it. Pt is questioning whether MD could recommend something different.   Pt state he did feel medication helped with his legs.

## 2020-04-20 NOTE — Telephone Encounter (Signed)
Pt c/o medication issue: 1. Name of Medication: Cilostazol 50 mhg 2. How are you currently taking this medication (dosage and times per day)? Patient cut back onece a day  3. Are you having a reaction (difficulty breathing--STAT)?  No  4. What is your medication issue? loose stool

## 2020-04-22 ENCOUNTER — Telehealth: Payer: Self-pay | Admitting: Internal Medicine

## 2020-04-22 NOTE — Telephone Encounter (Signed)
Spoke with wife.  They have cut back to once daily to see if he can tolerate this.  Advised that he can try taking 1 qd or even 1 qod.  Advised that if he doesn't feel any benefit with spacing the doses out, he should just discontinue.  Wife voiced understanding

## 2020-04-22 NOTE — Telephone Encounter (Signed)
Called and spoke with Patient and Patient Wife, Inez Catalina ([place]).  Patient request a refill for Symbicort. Patient LOV was 03/12/18, with Dr. Melvyn Novas. Inez Catalina stated Patient only uses Symbicort as needed.  Patient is scheduled 04/23/20 at 10, with Eustaquio Maize, NP. Nothing further at this time.

## 2020-04-22 NOTE — Telephone Encounter (Signed)
Pt's spouse returned called will route to kristin pharmd so she may try to reach back out

## 2020-04-23 ENCOUNTER — Encounter: Payer: Self-pay | Admitting: Primary Care

## 2020-04-23 ENCOUNTER — Other Ambulatory Visit: Payer: Self-pay

## 2020-04-23 ENCOUNTER — Ambulatory Visit (INDEPENDENT_AMBULATORY_CARE_PROVIDER_SITE_OTHER): Payer: Medicare HMO | Admitting: Primary Care

## 2020-04-23 DIAGNOSIS — J449 Chronic obstructive pulmonary disease, unspecified: Secondary | ICD-10-CM

## 2020-04-23 MED ORDER — BUDESONIDE-FORMOTEROL FUMARATE 80-4.5 MCG/ACT IN AERO: 2.0000 | INHALATION_SPRAY | Freq: Two times a day (BID) | RESPIRATORY_TRACT | 2 refills | Status: DC | PRN

## 2020-04-23 NOTE — Progress Notes (Signed)
@Patient  ID: Lance Dillon, male    DOB: 03/18/1940, 80 y.o.   MRN: [id number]  Chief Complaint  Patient presents with  . Follow-up    SOB at times     Referring provider: Deland Pretty, MD  HPI: 80 year old male, former smoker quit in 2004 (90-pack-year history).  History significant for COPD Gold 2, hypertension, stereo cerebral artery aneurysm, kidney disease stage II, prostate cancer, hyperlipidemia.  Patient of Dr. Melvyn Novas, last seen on 11/20.  04/23/2020 Patient presents today for follow-up for COPD. He develop a dry cough several weeks ago, feels it is related to post nasal drip. He has been taking mucinex and flonase with improvement. Mild shortness of breath with moderate exertion. He has not used symbicort 80 over the last year. Uses it as needed and needs a refill. No nocturnal symptoms. Denies f/c/s, chest tightness, chest pain, wheezing.   Allergies  Allergen Reactions  . Avelox [Moxifloxacin Hcl In Nacl] Diarrhea    diarrhea  . Moxifloxacin Diarrhea    diarrhea    Immunization History  Administered Date(s) Administered  . DTaP 10/17/2010  . Influenza, High Dose Seasonal PF 11/13/2014, 10/19/2015, 10/23/2018  . Influenza, Quadrivalent, Recombinant, Inj, Pf 11/07/2016, 10/18/2017, 10/23/2018, 10/24/2019  . Influenza,inj,quad, With Preservative 10/30/2016  . Influenza-Unspecified 10/31/2014  . PFIZER(Purple Top)SARS-COV-2 Vaccination 02/20/2019, 03/13/2019, 12/01/2019  . Pneumococcal Conjugate-13 05/28/2013  . Pneumococcal-Unspecified 11/05/2013  . Tdap 10/17/2010  . Zoster 09/20/2009  . Zoster Recombinat (Shingrix) 11/19/2017, 02/11/2018    Past Medical History:  Diagnosis Date  . Arthritis   . Atherosclerosis of aortic bifurcation and common iliac arteries [facility]) February 2017    Noted on abdominal/renal Dopplers  . Blind right eye   . 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 Cataract   . Colon polyps 2004, 07, 14  . COPD (chronic obstructive pulmonary disease) ([place])   . Glaucoma    left eye   . Heart murmur    No evidence of valvular lesion noted on Echo 01/2010  . Hx of radiation therapy 03/22/11 to 05/08/11   PSA recurrent carcinoma of prostate  . Hypercholesterolemia   . Hypertension   . Macular degeneration   . Macular degeneration of both eyes   . Peripheral vascular disease [facility]) June 2013   Bilateral SFA stenosis: Status post right femoropopliteal bypass - Dr. Kellie Simmering  . 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 Prostate cancer ([place]) 2004  . Renal artery stenosis (HCC)    70% left  . Stones in the urinary tract   . Stroke ([place])   . Urinary incontinence     Tobacco History: Social History   Tobacco Use  Smoking Status Former Smoker  . Packs/day: 3.00  . Years: 30.00  . Pack years: 90.00  . Types: Cigarettes  . Quit date: 07/01/2002  . Years since quitting: 17.8  Smokeless Tobacco Never Used   Counseling given: Not Answered   Outpatient Medications Prior to Visit  Medication Sig Dispense Refill  . acetaminophen (TYLENOL) 500 MG tablet Take 500 mg by mouth every 6 (six) hours as needed.    Marland Kitchen amLODipine (NORVASC) 10 MG tablet TAKE 1 TABLET BY MOUTH EVERY DAY 90 tablet 1  . aspirin 81 MG tablet Take 81 mg by mouth daily.    Marland Kitchen  Calcium Carbonate-Vitamin D (CALTRATE 600+D PO) Take 1 tablet by mouth daily.     . cilostazol (PLETAL) 50 MG tablet Take 1 tablet (50 mg total) by mouth 2 (two) times daily. (Patient taking differently: Take 50 mg by mouth daily.) 180 tablet 3  . Cyanocobalamin (VITAMIN B 12 PO) Take 2,500 mg by mouth daily.    . fluticasone (FLONASE) 50 MCG/ACT nasal spray Place 2 sprays into both nostrils as needed (for nasal congestion).     . hydrochlorothiazide (HYDRODIURIL) 12.5 MG  tablet TAKE 1 TABLET BY MOUTH EVERY DAY ALONG WITH LOSARTAN  1  . ibuprofen (ADVIL,MOTRIN) 200 MG tablet Take 200 mg by mouth every 6 (six) hours as needed.    . latanoprost (XALATAN) 0.005 % ophthalmic solution Place 1 drop into the left eye at bedtime.     Marland Kitchen losartan (COZAAR) 100 MG tablet TAKE 1 TABLET BY MOUTH EVERY DAY ALONG WITH HCTZ 90 tablet 2  . LUTEIN PO Take 1 tablet by mouth daily.    . metoprolol succinate (TOPROL-XL) 25 MG 24 hr tablet TAKE 1 TABLET (25 MG TOTAL) BY MOUTH DAILY. ** DO NOT CRUSH ** (BETA BLOCKER) 90 tablet 3  . Omega-3 Fatty Acids (FISH OIL) 1200 MG CAPS Take 3,600 mg by mouth daily.    . rosuvastatin (CRESTOR) 10 MG tablet TAKE 1 TABLET BY MOUTH EVERY DAY 90 tablet 2   No facility-administered medications prior to visit.    Review of Systems  Review of Systems  Constitutional: Negative.   HENT: Positive for postnasal drip.   Respiratory: Positive for cough. Negative for chest tightness and wheezing.        Mild DOE  Cardiovascular: Negative.    Physical Exam  BP 120/70 (BP Location: Left Arm, Cuff Size: Normal)   Pulse 68   Temp 97.6 F (36.4 C) (Temporal)   Ht 5\' 8"  (1.727 m)   Wt 168 lb 6.4 oz (76.4 kg)   SpO2 98%   BMI 25.61 kg/m  Physical Exam Constitutional:      Appearance: Normal appearance.  HENT:     Head: Normocephalic and atraumatic.     Mouth/Throat:     Mouth: Mucous membranes are moist.     Pharynx: Oropharynx is clear.  Cardiovascular:     Rate and Rhythm: Normal rate and regular rhythm.  Pulmonary:     Effort: Pulmonary effort is normal.     Breath sounds: Normal breath sounds. No wheezing, rhonchi or rales.     Comments: CTA Musculoskeletal:        General: Normal range of motion.  Skin:    General: Skin is warm and dry.  Neurological:     General: No focal deficit present.     Mental Status: He is alert and oriented to person, place, and time. Mental status is at baseline.  Psychiatric:        Mood and Affect: Mood  normal.        Behavior: Behavior normal.        Thought Content: Thought content normal.        Judgment: Judgment normal.      Lab Results:  CBC    Component Value Date/Time   WBC 5.4 07/28/2015 1500   RBC 3.41 (L) 07/28/2015 1500   HGB 11.0 (L) 07/28/2015 1500   HCT 31.9 (L) 07/28/2015 1500   PLT 152 07/28/2015 1500   MCV 93.5 07/28/2015 1500   MCH 32.3 07/28/2015 1500   MCHC 34.5 07/28/2015  1500   RDW 13.7 07/28/2015 1500   LYMPHSABS 1.3 12/16/2013 0611   MONOABS 0.5 12/16/2013 0611   EOSABS 0.2 12/16/2013 0611   BASOSABS 0.1 12/16/2013 0611    BMET    Component Value Date/Time   NA 136 07/28/2015 1500   K 3.6 07/28/2015 1500   CL 103 07/28/2015 1500   CO2 25 07/28/2015 1500   GLUCOSE 90 07/28/2015 1500   BUN 23 (H) 07/28/2015 1500   CREATININE 1.51 (H) 11/25/2015 1210   CALCIUM 8.8 (L) 07/28/2015 1500   GFRNONAA 44 (L) 11/25/2015 1210   GFRAA 51 (L) 11/25/2015 1210    BNP No results found for: BNP  ProBNP No results found for: PROBNP  Imaging: No results found.   Assessment & Plan:   COPD GOLD II with symptoms more typical of AB/cough variant asthma Quit smoking in 2004. Spirometry in 2019 FEV1 1.68 (61%), ratio 65 - Increased cough off low dose ICS/LABA. DOE with moderate exertion. No chest tightness or wheezing - Continue Flonase nasal spray daily; start loratidine as needed during allergy season. Resume Symbicort 51mcg 1-2 puffs twice daily as needed for cough.  - Follow-up in 1 year with Dr. Melvyn Novas or as needed    Martyn Ehrich, NP 04/23/2020

## 2020-04-23 NOTE — Patient Instructions (Signed)
Nice meeting you today Mr Purnell  Recommendations: Continue Flonase nasal spray  Start Symbicort 1-2 puffs twice daily as needed for cough Start Claritin (loratidine) as needed during allergy season   Rx: Refill Symbicort 80   Follow-up: 1 year with Dr. Melvyn Novas or as needed

## 2020-04-23 NOTE — Assessment & Plan Note (Addendum)
Quit smoking in 2004. Spirometry in 2019 FEV1 1.68 (61%), ratio 65 - Increased cough off low dose ICS/LABA. DOE with moderate exertion. No chest tightness or wheezing - Continue Flonase nasal spray daily; start loratidine as needed during allergy season. Resume Symbicort 71mcg 1-2 puffs twice daily as needed for cough.  - Follow-up in 1 year with Dr. Melvyn Novas or as needed

## 2020-04-29 DIAGNOSIS — N1831 Chronic kidney disease, stage 3a: Secondary | ICD-10-CM | POA: Diagnosis not present

## 2020-04-29 DIAGNOSIS — J441 Chronic obstructive pulmonary disease with (acute) exacerbation: Secondary | ICD-10-CM | POA: Diagnosis not present

## 2020-04-29 DIAGNOSIS — M810 Age-related osteoporosis without current pathological fracture: Secondary | ICD-10-CM | POA: Diagnosis not present

## 2020-04-29 DIAGNOSIS — I129 Hypertensive chronic kidney disease with stage 1 through stage 4 chronic kidney disease, or unspecified chronic kidney disease: Secondary | ICD-10-CM | POA: Diagnosis not present

## 2020-05-10 DIAGNOSIS — M5416 Radiculopathy, lumbar region: Secondary | ICD-10-CM | POA: Diagnosis not present

## 2020-05-28 DIAGNOSIS — M5416 Radiculopathy, lumbar region: Secondary | ICD-10-CM | POA: Insufficient documentation

## 2020-05-29 DIAGNOSIS — I129 Hypertensive chronic kidney disease with stage 1 through stage 4 chronic kidney disease, or unspecified chronic kidney disease: Secondary | ICD-10-CM | POA: Diagnosis not present

## 2020-05-29 DIAGNOSIS — J441 Chronic obstructive pulmonary disease with (acute) exacerbation: Secondary | ICD-10-CM | POA: Diagnosis not present

## 2020-05-29 DIAGNOSIS — M810 Age-related osteoporosis without current pathological fracture: Secondary | ICD-10-CM | POA: Diagnosis not present

## 2020-05-29 DIAGNOSIS — N1831 Chronic kidney disease, stage 3a: Secondary | ICD-10-CM | POA: Diagnosis not present

## 2020-06-15 DIAGNOSIS — M5459 Other low back pain: Secondary | ICD-10-CM | POA: Diagnosis not present

## 2020-06-15 DIAGNOSIS — M5416 Radiculopathy, lumbar region: Secondary | ICD-10-CM | POA: Diagnosis not present

## 2020-06-23 ENCOUNTER — Encounter: Payer: Self-pay | Admitting: Cardiovascular Disease

## 2020-06-23 ENCOUNTER — Ambulatory Visit: Payer: Medicare HMO | Admitting: Cardiovascular Disease

## 2020-06-23 ENCOUNTER — Other Ambulatory Visit: Payer: Self-pay

## 2020-06-23 VITALS — BP 116/62 | HR 81 | Ht 68.0 in | Wt 170.4 lb

## 2020-06-23 DIAGNOSIS — I70213 Atherosclerosis of native arteries of extremities with intermittent claudication, bilateral legs: Secondary | ICD-10-CM | POA: Diagnosis not present

## 2020-06-23 DIAGNOSIS — I10 Essential (primary) hypertension: Secondary | ICD-10-CM

## 2020-06-23 DIAGNOSIS — E785 Hyperlipidemia, unspecified: Secondary | ICD-10-CM | POA: Diagnosis not present

## 2020-06-23 DIAGNOSIS — I701 Atherosclerosis of renal artery: Secondary | ICD-10-CM

## 2020-06-23 DIAGNOSIS — I6521 Occlusion and stenosis of right carotid artery: Secondary | ICD-10-CM | POA: Diagnosis not present

## 2020-06-23 NOTE — Assessment & Plan Note (Signed)
History of intermittent claudication with known PAD Dopplers performed 03/03/2020 revealing ABIs in the 0.6 range bilaterally with most likely occluded SFAs bilaterally as well.  He did have some improvement on cilostazol although he has had some diarrhea with this and is taking it once a day.  I do not think he is a candidate for revascularization.  His right femoropopliteal bypass graft has been shown to be occluded.  Ange

## 2020-06-23 NOTE — Assessment & Plan Note (Signed)
History of carotid artery disease with known occluded ICAs bilaterally.  Does have a posterior circulation aneurysm as well followed by Dr. Patrecia Pour

## 2020-06-23 NOTE — Progress Notes (Signed)
06/23/2020 Lance Dillon   14-Oct-1940  [id number]  Primary Physician Deland Pretty, MD Primary Cardiologist: Lorretta Harp MD Lance Dillon, Lance Dillon, [place]  HPI:  VICTORMANUEL Dillon is a 80 y.o.  formally a patient of Dr. Adonis Dillon the past, then Dr. Ellyn Dillon and currently I am care of his heart and peripheral vasculature.I last saw him in the office1/25/2022.He has no prior cardiac history. He does have a history of hypertension and hyperlipidemia. He had angiography performed by Dr. Trula Dillon 06/20/11 the setting of right lower extremely claudication. At the time of angiography he had demonstrated 70% left renal artery stenosis. He ultimately underwent right femoropopliteal bypass grafting by Dr. Kellie Dillon. Following his renal Doppler studies semiannually which have demonstrated a mild progressive decline in renal dimensions as well as increase in velocities. His left renal dimension is still 10.9 cm. 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 His most recent renal Doppler studies performed 04/19/16 revealed a left renal aortic ratio of 6.64 with a left renal dimension that had decreased to 1 cm.He denies chest pain or shortness of breath since I saw him last.    His most recent renal Dopplers performed 11/21/2018 revealed an occluded left renal artery, small left kidney with a  compensated right renal dimension 10.8 cm. Carotid Dopplers performed at the same time showed known occluded bilateral ICAs.  He does have a posterior circulation aneurysm.  Since I saw him in the office 4 months ago he continues to do well.  His biggest complaint is of pain in his back and hips probably related to degenerative disc disease.  He does also complain of claudication although the cilostazol has improved this somewhat.  He denies chest pain or shortness of breath.  We  talked again about lower extremity revascularization and decided not to pursue this given all of his comorbidities.  Current Meds  Medication Sig  . acetaminophen (TYLENOL) 500 MG tablet Take 500 mg by mouth every 6 (Dillon) hours as needed.  Marland Kitchen amLODipine (NORVASC) 10 MG tablet TAKE 1 TABLET BY MOUTH EVERY DAY  . aspirin 81 MG tablet Take 81 mg by mouth daily.  . budesonide-formoterol (SYMBICORT) 80-4.5 MCG/ACT inhaler Inhale 2 puffs into the lungs 2 (two) times daily as needed.  . Calcium Carbonate-Vitamin D (CALTRATE 600+D PO) Take 1 tablet by mouth daily.   . cilostazol (PLETAL) 50 MG tablet Take 1 tablet (50 mg total) by mouth 2 (two) times daily. (Patient taking differently: Take 50 mg by mouth daily.)  . Cyanocobalamin (VITAMIN B 12 PO) Take 2,500 mg by mouth daily.  . fluticasone (FLONASE) 50 MCG/ACT nasal spray Place 2 sprays into both nostrils as needed (for nasal congestion).   . hydrochlorothiazide (HYDRODIURIL) 12.5 MG tablet TAKE 1 TABLET BY MOUTH EVERY DAY ALONG WITH LOSARTAN  . ibuprofen (ADVIL,MOTRIN) 200 MG tablet Take 200 mg by mouth every 6 (Dillon) hours as needed.  . latanoprost (XALATAN) 0.005 % ophthalmic solution Place 1 drop into the left eye at bedtime.   Marland Kitchen losartan (COZAAR) 100 MG tablet TAKE 1 TABLET BY MOUTH EVERY DAY ALONG WITH HCTZ  . LUTEIN PO Take 1 tablet by mouth daily.  . metoprolol succinate (TOPROL-XL) 25 MG 24 hr tablet TAKE 1 TABLET (25 MG TOTAL) BY MOUTH DAILY. ** DO NOT CRUSH ** (BETA BLOCKER)  . Omega-3 Fatty Acids (FISH OIL) 1200 MG  CAPS Take 3,600 mg by mouth daily.  . rosuvastatin (CRESTOR) 10 MG tablet TAKE 1 TABLET BY MOUTH EVERY DAY     Allergies  Allergen Reactions  . Avelox [Moxifloxacin Hcl In Nacl] Diarrhea    diarrhea  . Moxifloxacin Diarrhea    diarrhea    Social History   Socioeconomic History  . Marital status: Married    Spouse name: Not on file  . Number of children: Not on file  . Years of education: Not on file  . Highest  education level: Not on file  Occupational History  . Occupation: Therapist, occupational: [employer]  Tobacco Use  . Smoking status: Former Smoker    Packs/day: 3.00    Years: 30.00    Pack years: 90.00    Types: Cigarettes    Quit date: 07/01/2002    Years since quitting: 17.9  . Smokeless tobacco: Never Used  Vaping Use  . Vaping Use: Never used  Substance and Sexual Activity  . Alcohol use: No  . Drug use: No  . Sexual activity: Not on file  Other Topics Concern  . 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   Usually walks 2 miles maybe 2-3 days a week. Just not a cold weather.   He is a former smoker, quit "cold [place] "after 3 pack per day for 60 pack years in 2004   Social Determinants of Health   Financial Resource Strain: Not on file  Food Insecurity: Not on file  Transportation Needs: Not on file  Physical Activity: Not on file  Stress: Not on file  Social Connections: Not on file  Intimate Partner Violence: Not on file     Review of Systems: General: negative for chills, fever, night sweats or weight changes.  Cardiovascular: negative for chest pain, dyspnea on exertion, edema, orthopnea, palpitations, paroxysmal nocturnal dyspnea or shortness of breath Dermatological: negative for rash Respiratory: negative for cough or wheezing Urologic: negative for hematuria Abdominal: negative for nausea, vomiting, diarrhea, bright red blood per rectum, melena, or hematemesis Neurologic: negative for visual changes, syncope, or dizziness All other systems reviewed and are otherwise negative except as noted above.    Blood pressure 116/62, pulse 81, height 5\' 8"  (1.727 m), weight 170 lb 6.4 oz (77.3 kg), SpO2 97 %.  General appearance: alert and no distress Neck: no adenopathy, no carotid bruit, no JVD, supple, symmetrical, trachea midline  and thyroid not enlarged, symmetric, no tenderness/mass/nodules Lungs: clear to auscultation bilaterally Heart: regular rate and rhythm, S1, S2 normal, no murmur, click, rub or gallop Extremities: extremities normal, atraumatic, no cyanosis or edema Pulses: 2+ and symmetric Skin: Skin color, texture, turgor normal. No rashes or lesions Neurologic: Alert and oriented X 3, normal strength and tone. Normal symmetric reflexes. Normal coordination and gait  EKG sinus rhythm 81 without ST or T wave changes.  I personally reviewed this EKG.  ASSESSMENT AND PLAN:   Atherosclerosis of native artery of extremity with intermittent claudication (HCC) History of intermittent claudication with known PAD Dopplers performed 03/03/2020 revealing ABIs in the 0.6 range bilaterally with most likely occluded SFAs bilaterally as well.  He did have some improvement on cilostazol although he has had some diarrhea with this and is taking it once a day.  I do not think he is a candidate for revascularization.  His right femoropopliteal bypass  graft has been shown to be occluded.  Ange  Left renal artery stenosis - 70% by angiography; increased velocity on recent Dopplers (60-99%) History of left renal artery stenosis in the past with recent Doppler studies that showed low velocities and a smaller left kidney suggesting occlusion.  His right kidney measures 10.8 cm pole-to-pole has a renal aortic ratio 3.46.  We will continue to follow this on annual basis.  His blood pressure is under good control and his renal function last year was 1.3.  Essential hypertension History of essential hypertension a blood pressure measured today 116/62.  He is on amlodipine, hydrochlorothiazide and losartan as well as metoprolol.  Hyperlipidemia LDL goal <70 History of hyperlipidemia on statin therapy with lipid profile performed 07/22/2019 revealing total cholesterol of 126, LDL 55 and HDL of 44.  Carotid occlusion, right History of  carotid artery disease with known occluded ICAs bilaterally.  Does have a posterior circulation aneurysm as well followed by Dr. Rica Koyanagi MD [facility], [facility] 06/23/2020 9:22 AM

## 2020-06-23 NOTE — Assessment & Plan Note (Signed)
History of left renal artery stenosis in the past with recent Doppler studies that showed low velocities and a smaller left kidney suggesting occlusion.  His right kidney measures 10.8 cm pole-to-pole has a renal aortic ratio 3.46.  We will continue to follow this on annual basis.  His blood pressure is under good control and his renal function last year was 1.3.

## 2020-06-23 NOTE — Patient Instructions (Signed)
Medication Instructions:  Your physician recommends that you continue on your current medications as directed. Please refer to the Current Medication list given to you today.  *If you need a refill on your cardiac medications before your next appointment, please call your pharmacy*   Testing/Procedures: Your physician has requested that you have a lower extremity arterial duplex. This test is an ultrasound of the arteries in the legs. It looks at arterial blood flow in the legs. Allow one hour for Lower Arterial scans. There are no restrictions or special instructions  Your physician has requested that you have an ankle brachial index (ABI). During this test an ultrasound and blood pressure cuff are used to evaluate the arteries that supply the arms and legs with blood. Allow thirty minutes for this exam. There are no restrictions or special instructions.  Your physician has requested that you have a renal artery duplex. During this test, an ultrasound is used to evaluate blood flow to the kidneys. Allow one hour for this exam. Do not eat after midnight the day before and avoid carbonated beverages. Take your medications as you usually do.  Your physician has requested that you have a carotid duplex. This test is an ultrasound of the carotid arteries in your neck. It looks at blood flow through these arteries that supply the brain with blood. Allow one hour for this exam. There are no restrictions or special instructions.  These procedures are done at [place]. 2nd Floor. To be done in February 2023.  Follow-Up: At [facility], you and your health needs are our priority.  As part of our continuing mission to provide you with exceptional heart care, we have created designated Provider Care Teams.  These Care Teams include your primary Cardiologist (physician) and Advanced Practice Providers (APPs -  Physician Assistants and Nurse Practitioners) who all work together to provide you with  the care you need, when you need it.  We recommend signing up for the patient portal called "MyChart".  Sign up information is provided on this After Visit Summary.  MyChart is used to connect with patients for Virtual Visits (Telemedicine).  Patients are able to view lab/test results, encounter notes, upcoming appointments, etc.  Non-urgent messages can be sent to your provider as well.   To learn more about what you can do with MyChart, go to [email or web address].    Your next appointment:   12 month(s)  The format for your next appointment:   In Person  Provider:   Quay Burow, MD

## 2020-06-23 NOTE — Assessment & Plan Note (Signed)
History of hyperlipidemia on statin therapy with lipid profile performed 07/22/2019 revealing total cholesterol of 126, LDL 55 and HDL of 44.

## 2020-06-23 NOTE — Assessment & Plan Note (Signed)
History of essential hypertension a blood pressure measured today 116/62.  He is on amlodipine, hydrochlorothiazide and losartan as well as metoprolol.

## 2020-06-23 NOTE — Addendum Note (Signed)
Addended by: Beatrix Fetters on: 06/23/2020 09:27 AM   Modules accepted: Orders

## 2020-07-01 DIAGNOSIS — M5416 Radiculopathy, lumbar region: Secondary | ICD-10-CM | POA: Diagnosis not present

## 2020-07-22 DIAGNOSIS — M5416 Radiculopathy, lumbar region: Secondary | ICD-10-CM | POA: Diagnosis not present

## 2020-07-29 DIAGNOSIS — I129 Hypertensive chronic kidney disease with stage 1 through stage 4 chronic kidney disease, or unspecified chronic kidney disease: Secondary | ICD-10-CM | POA: Diagnosis not present

## 2020-07-29 DIAGNOSIS — J441 Chronic obstructive pulmonary disease with (acute) exacerbation: Secondary | ICD-10-CM | POA: Diagnosis not present

## 2020-07-29 DIAGNOSIS — M810 Age-related osteoporosis without current pathological fracture: Secondary | ICD-10-CM | POA: Diagnosis not present

## 2020-07-29 DIAGNOSIS — N1831 Chronic kidney disease, stage 3a: Secondary | ICD-10-CM | POA: Diagnosis not present

## 2020-08-09 DIAGNOSIS — R739 Hyperglycemia, unspecified: Secondary | ICD-10-CM | POA: Diagnosis not present

## 2020-08-09 DIAGNOSIS — Z125 Encounter for screening for malignant neoplasm of prostate: Secondary | ICD-10-CM | POA: Diagnosis not present

## 2020-08-09 DIAGNOSIS — E785 Hyperlipidemia, unspecified: Secondary | ICD-10-CM | POA: Diagnosis not present

## 2020-08-09 DIAGNOSIS — M810 Age-related osteoporosis without current pathological fracture: Secondary | ICD-10-CM | POA: Diagnosis not present

## 2020-08-09 DIAGNOSIS — I129 Hypertensive chronic kidney disease with stage 1 through stage 4 chronic kidney disease, or unspecified chronic kidney disease: Secondary | ICD-10-CM | POA: Diagnosis not present

## 2020-08-12 DIAGNOSIS — R32 Unspecified urinary incontinence: Secondary | ICD-10-CM | POA: Diagnosis not present

## 2020-08-12 DIAGNOSIS — M810 Age-related osteoporosis without current pathological fracture: Secondary | ICD-10-CM | POA: Diagnosis not present

## 2020-08-12 DIAGNOSIS — C61 Malignant neoplasm of prostate: Secondary | ICD-10-CM | POA: Diagnosis not present

## 2020-08-12 DIAGNOSIS — J439 Emphysema, unspecified: Secondary | ICD-10-CM | POA: Diagnosis not present

## 2020-08-12 DIAGNOSIS — I6523 Occlusion and stenosis of bilateral carotid arteries: Secondary | ICD-10-CM | POA: Diagnosis not present

## 2020-08-12 DIAGNOSIS — I739 Peripheral vascular disease, unspecified: Secondary | ICD-10-CM | POA: Diagnosis not present

## 2020-08-12 DIAGNOSIS — Z0001 Encounter for general adult medical examination with abnormal findings: Secondary | ICD-10-CM | POA: Diagnosis not present

## 2020-08-12 DIAGNOSIS — I10 Essential (primary) hypertension: Secondary | ICD-10-CM | POA: Diagnosis not present

## 2020-08-12 DIAGNOSIS — I671 Cerebral aneurysm, nonruptured: Secondary | ICD-10-CM | POA: Diagnosis not present

## 2020-08-12 DIAGNOSIS — N261 Atrophy of kidney (terminal): Secondary | ICD-10-CM | POA: Diagnosis not present

## 2020-08-18 DIAGNOSIS — M5416 Radiculopathy, lumbar region: Secondary | ICD-10-CM | POA: Diagnosis not present

## 2020-08-24 ENCOUNTER — Other Ambulatory Visit (HOSPITAL_COMMUNITY): Payer: Self-pay | Admitting: Interventional Radiology

## 2020-08-24 DIAGNOSIS — I671 Cerebral aneurysm, nonruptured: Secondary | ICD-10-CM

## 2020-08-29 DIAGNOSIS — N1831 Chronic kidney disease, stage 3a: Secondary | ICD-10-CM | POA: Diagnosis not present

## 2020-08-29 DIAGNOSIS — J441 Chronic obstructive pulmonary disease with (acute) exacerbation: Secondary | ICD-10-CM | POA: Diagnosis not present

## 2020-08-29 DIAGNOSIS — M810 Age-related osteoporosis without current pathological fracture: Secondary | ICD-10-CM | POA: Diagnosis not present

## 2020-08-29 DIAGNOSIS — I129 Hypertensive chronic kidney disease with stage 1 through stage 4 chronic kidney disease, or unspecified chronic kidney disease: Secondary | ICD-10-CM | POA: Diagnosis not present

## 2020-09-03 DIAGNOSIS — M5416 Radiculopathy, lumbar region: Secondary | ICD-10-CM | POA: Diagnosis not present

## 2020-09-10 DIAGNOSIS — H00015 Hordeolum externum left lower eyelid: Secondary | ICD-10-CM | POA: Diagnosis not present

## 2020-09-12 DIAGNOSIS — J439 Emphysema, unspecified: Secondary | ICD-10-CM | POA: Diagnosis not present

## 2020-09-13 ENCOUNTER — Ambulatory Visit (HOSPITAL_COMMUNITY)
Admission: RE | Admit: 2020-09-13 | Discharge: 2020-09-13 | Disposition: A | Discharge status: Home / Self Care | Payer: Medicare HMO | Source: Ambulatory Visit | Attending: Interventional Radiology | Admitting: Interventional Radiology

## 2020-09-13 ENCOUNTER — Other Ambulatory Visit: Payer: Self-pay

## 2020-09-13 DIAGNOSIS — I671 Cerebral aneurysm, nonruptured: Secondary | ICD-10-CM | POA: Insufficient documentation

## 2020-09-13 DIAGNOSIS — I6523 Occlusion and stenosis of bilateral carotid arteries: Secondary | ICD-10-CM | POA: Diagnosis not present

## 2020-09-13 DIAGNOSIS — I725 Aneurysm of other precerebral arteries: Secondary | ICD-10-CM | POA: Diagnosis not present

## 2020-09-15 ENCOUNTER — Telehealth (HOSPITAL_COMMUNITY): Payer: Self-pay

## 2020-09-15 NOTE — Telephone Encounter (Signed)
Called pt regarding recent imaging, no answer, left vm. AW  

## 2020-09-16 ENCOUNTER — Telehealth (HOSPITAL_COMMUNITY): Payer: Self-pay

## 2020-09-16 DIAGNOSIS — H524 Presbyopia: Secondary | ICD-10-CM | POA: Diagnosis not present

## 2020-09-16 DIAGNOSIS — C61 Malignant neoplasm of prostate: Secondary | ICD-10-CM | POA: Diagnosis not present

## 2020-09-16 DIAGNOSIS — H353133 Nonexudative age-related macular degeneration, bilateral, advanced atrophic without subfoveal involvement: Secondary | ICD-10-CM | POA: Diagnosis not present

## 2020-09-16 DIAGNOSIS — H401122 Primary open-angle glaucoma, left eye, moderate stage: Secondary | ICD-10-CM | POA: Diagnosis not present

## 2020-09-16 NOTE — Telephone Encounter (Signed)
Returned pt's call, no answer, left vm to f/u in 1 year. AW

## 2020-09-24 DIAGNOSIS — N393 Stress incontinence (female) (male): Secondary | ICD-10-CM | POA: Diagnosis not present

## 2020-09-24 DIAGNOSIS — C61 Malignant neoplasm of prostate: Secondary | ICD-10-CM | POA: Diagnosis not present

## 2020-09-24 DIAGNOSIS — N35012 Post-traumatic membranous urethral stricture: Secondary | ICD-10-CM | POA: Diagnosis not present

## 2020-09-29 DIAGNOSIS — I129 Hypertensive chronic kidney disease with stage 1 through stage 4 chronic kidney disease, or unspecified chronic kidney disease: Secondary | ICD-10-CM | POA: Diagnosis not present

## 2020-09-29 DIAGNOSIS — M810 Age-related osteoporosis without current pathological fracture: Secondary | ICD-10-CM | POA: Diagnosis not present

## 2020-09-29 DIAGNOSIS — J441 Chronic obstructive pulmonary disease with (acute) exacerbation: Secondary | ICD-10-CM | POA: Diagnosis not present

## 2020-09-29 DIAGNOSIS — N1831 Chronic kidney disease, stage 3a: Secondary | ICD-10-CM | POA: Diagnosis not present

## 2020-10-12 ENCOUNTER — Other Ambulatory Visit: Payer: Self-pay | Admitting: Cardiovascular Disease

## 2020-10-13 DIAGNOSIS — R82998 Other abnormal findings in urine: Secondary | ICD-10-CM | POA: Diagnosis not present

## 2020-10-29 DIAGNOSIS — I129 Hypertensive chronic kidney disease with stage 1 through stage 4 chronic kidney disease, or unspecified chronic kidney disease: Secondary | ICD-10-CM | POA: Diagnosis not present

## 2020-10-29 DIAGNOSIS — J441 Chronic obstructive pulmonary disease with (acute) exacerbation: Secondary | ICD-10-CM | POA: Diagnosis not present

## 2020-10-29 DIAGNOSIS — N1831 Chronic kidney disease, stage 3a: Secondary | ICD-10-CM | POA: Diagnosis not present

## 2020-10-29 DIAGNOSIS — M810 Age-related osteoporosis without current pathological fracture: Secondary | ICD-10-CM | POA: Diagnosis not present

## 2020-11-02 DIAGNOSIS — Z23 Encounter for immunization: Secondary | ICD-10-CM | POA: Diagnosis not present

## 2020-11-29 DIAGNOSIS — I129 Hypertensive chronic kidney disease with stage 1 through stage 4 chronic kidney disease, or unspecified chronic kidney disease: Secondary | ICD-10-CM | POA: Diagnosis not present

## 2020-11-29 DIAGNOSIS — J441 Chronic obstructive pulmonary disease with (acute) exacerbation: Secondary | ICD-10-CM | POA: Diagnosis not present

## 2020-11-29 DIAGNOSIS — M810 Age-related osteoporosis without current pathological fracture: Secondary | ICD-10-CM | POA: Diagnosis not present

## 2020-11-29 DIAGNOSIS — N1831 Chronic kidney disease, stage 3a: Secondary | ICD-10-CM | POA: Diagnosis not present

## 2020-12-09 DIAGNOSIS — M205X1 Other deformities of toe(s) (acquired), right foot: Secondary | ICD-10-CM | POA: Diagnosis not present

## 2020-12-09 DIAGNOSIS — L03115 Cellulitis of right lower limb: Secondary | ICD-10-CM | POA: Diagnosis not present

## 2020-12-09 DIAGNOSIS — M2041 Other hammer toe(s) (acquired), right foot: Secondary | ICD-10-CM | POA: Diagnosis not present

## 2020-12-16 DIAGNOSIS — I739 Peripheral vascular disease, unspecified: Secondary | ICD-10-CM | POA: Diagnosis not present

## 2020-12-16 DIAGNOSIS — L03115 Cellulitis of right lower limb: Secondary | ICD-10-CM | POA: Diagnosis not present

## 2020-12-17 ENCOUNTER — Other Ambulatory Visit: Payer: Self-pay | Admitting: Cardiovascular Disease

## 2020-12-17 ENCOUNTER — Telehealth: Payer: Self-pay | Admitting: Cardiovascular Disease

## 2020-12-17 NOTE — Telephone Encounter (Signed)
Spoke with wife, Lance Dillon (ok per DPR) who states that the pt went to his podiatrist yesterday. Pt has new sores on his feet and he mentioned his poor circulation to podiatrist who requests that pt's lower extremity ultrasounds been done in the next few weeks. Pt already has carotid and renal ultrasounds scheduled for 12/9, however lower extremities can not be added to this appointment. After discussion with wife, the priority is to see the blood flow in the legs so pt will have lower extremity arterial ultrasounds on 12/9 and carotid and renal ultrasounds will be rescheduled for a later date. Wife verbalizes understanding.

## 2020-12-17 NOTE — Telephone Encounter (Signed)
Patient's wife called in to say he needs ultrasound of his leg. He was recommend by his dr to have it done

## 2020-12-17 NOTE — Telephone Encounter (Signed)
Called pt. He has a renal and carotid ultrasound set up omn 12/9 and he wants to know can they add an ultrasound of his legs as well. Pt went to the podiatrist yesterday and he has sores on his feet. He states "I know I have poor circulation in my legs but I think we should keep a check on it." Pt made aware I will forward this message to Dr. Gwenlyn Found for further advice. He thanked me for calling him back.

## 2020-12-19 ENCOUNTER — Other Ambulatory Visit: Payer: Self-pay

## 2020-12-19 ENCOUNTER — Emergency Department (HOSPITAL_BASED_OUTPATIENT_CLINIC_OR_DEPARTMENT_OTHER): Payer: Medicare HMO

## 2020-12-19 ENCOUNTER — Inpatient Hospital Stay (HOSPITAL_BASED_OUTPATIENT_CLINIC_OR_DEPARTMENT_OTHER)
Admission: EM | Admit: 2020-12-19 | Discharge: 2020-12-25 | DRG: 253 | Disposition: A | Discharge status: Home Health | Payer: Medicare HMO | Attending: Internal Medicine | Admitting: Internal Medicine

## 2020-12-19 ENCOUNTER — Encounter (HOSPITAL_BASED_OUTPATIENT_CLINIC_OR_DEPARTMENT_OTHER): Payer: Self-pay | Admitting: Emergency Medicine

## 2020-12-19 DIAGNOSIS — L03115 Cellulitis of right lower limb: Secondary | ICD-10-CM | POA: Diagnosis not present

## 2020-12-19 DIAGNOSIS — E7800 Pure hypercholesterolemia, unspecified: Secondary | ICD-10-CM | POA: Diagnosis not present

## 2020-12-19 DIAGNOSIS — Z923 Personal history of irradiation: Secondary | ICD-10-CM

## 2020-12-19 DIAGNOSIS — I70201 Unspecified atherosclerosis of native arteries of extremities, right leg: Principal | ICD-10-CM | POA: Diagnosis present

## 2020-12-19 DIAGNOSIS — I10 Essential (primary) hypertension: Secondary | ICD-10-CM | POA: Diagnosis not present

## 2020-12-19 DIAGNOSIS — I701 Atherosclerosis of renal artery: Secondary | ICD-10-CM | POA: Diagnosis present

## 2020-12-19 DIAGNOSIS — M79604 Pain in right leg: Secondary | ICD-10-CM | POA: Diagnosis not present

## 2020-12-19 DIAGNOSIS — I129 Hypertensive chronic kidney disease with stage 1 through stage 4 chronic kidney disease, or unspecified chronic kidney disease: Secondary | ICD-10-CM | POA: Diagnosis present

## 2020-12-19 DIAGNOSIS — I6521 Occlusion and stenosis of right carotid artery: Secondary | ICD-10-CM | POA: Diagnosis present

## 2020-12-19 DIAGNOSIS — Z8546 Personal history of malignant neoplasm of prostate: Secondary | ICD-10-CM | POA: Diagnosis not present

## 2020-12-19 DIAGNOSIS — H5461 Unqualified visual loss, right eye, normal vision left eye: Secondary | ICD-10-CM | POA: Diagnosis present

## 2020-12-19 DIAGNOSIS — L03031 Cellulitis of right toe: Secondary | ICD-10-CM | POA: Diagnosis not present

## 2020-12-19 DIAGNOSIS — J449 Chronic obstructive pulmonary disease, unspecified: Secondary | ICD-10-CM | POA: Diagnosis not present

## 2020-12-19 DIAGNOSIS — K5900 Constipation, unspecified: Secondary | ICD-10-CM | POA: Diagnosis present

## 2020-12-19 DIAGNOSIS — M79674 Pain in right toe(s): Secondary | ICD-10-CM | POA: Diagnosis not present

## 2020-12-19 DIAGNOSIS — N182 Chronic kidney disease, stage 2 (mild): Secondary | ICD-10-CM | POA: Diagnosis not present

## 2020-12-19 DIAGNOSIS — L97512 Non-pressure chronic ulcer of other part of right foot with fat layer exposed: Secondary | ICD-10-CM

## 2020-12-19 DIAGNOSIS — Z79899 Other long term (current) drug therapy: Secondary | ICD-10-CM

## 2020-12-19 DIAGNOSIS — Z20822 Contact with and (suspected) exposure to covid-19: Secondary | ICD-10-CM | POA: Diagnosis present

## 2020-12-19 DIAGNOSIS — I7092 Chronic total occlusion of artery of the extremities: Secondary | ICD-10-CM | POA: Diagnosis not present

## 2020-12-19 DIAGNOSIS — Z86010 Personal history of colonic polyps: Secondary | ICD-10-CM | POA: Diagnosis not present

## 2020-12-19 DIAGNOSIS — I70335 Atherosclerosis of unspecified type of bypass graft(s) of the right leg with ulceration of other part of foot: Secondary | ICD-10-CM

## 2020-12-19 DIAGNOSIS — M1990 Unspecified osteoarthritis, unspecified site: Secondary | ICD-10-CM | POA: Diagnosis present

## 2020-12-19 DIAGNOSIS — I700 Atherosclerosis of aorta: Secondary | ICD-10-CM | POA: Diagnosis not present

## 2020-12-19 DIAGNOSIS — R531 Weakness: Secondary | ICD-10-CM | POA: Diagnosis not present

## 2020-12-19 DIAGNOSIS — Z9079 Acquired absence of other genital organ(s): Secondary | ICD-10-CM

## 2020-12-19 DIAGNOSIS — I671 Cerebral aneurysm, nonruptured: Secondary | ICD-10-CM | POA: Diagnosis present

## 2020-12-19 DIAGNOSIS — Z881 Allergy status to other antibiotic agents status: Secondary | ICD-10-CM

## 2020-12-19 DIAGNOSIS — I2510 Atherosclerotic heart disease of native coronary artery without angina pectoris: Secondary | ICD-10-CM | POA: Diagnosis present

## 2020-12-19 DIAGNOSIS — Z8249 Family history of ischemic heart disease and other diseases of the circulatory system: Secondary | ICD-10-CM | POA: Diagnosis not present

## 2020-12-19 DIAGNOSIS — Z8042 Family history of malignant neoplasm of prostate: Secondary | ICD-10-CM | POA: Diagnosis not present

## 2020-12-19 DIAGNOSIS — N186 End stage renal disease: Secondary | ICD-10-CM | POA: Diagnosis not present

## 2020-12-19 DIAGNOSIS — Z87891 Personal history of nicotine dependence: Secondary | ICD-10-CM | POA: Diagnosis not present

## 2020-12-19 DIAGNOSIS — Z8673 Personal history of transient ischemic attack (TIA), and cerebral infarction without residual deficits: Secondary | ICD-10-CM

## 2020-12-19 DIAGNOSIS — Z7982 Long term (current) use of aspirin: Secondary | ICD-10-CM

## 2020-12-19 DIAGNOSIS — I70235 Atherosclerosis of native arteries of right leg with ulceration of other part of foot: Secondary | ICD-10-CM | POA: Diagnosis not present

## 2020-12-19 DIAGNOSIS — H3530 Unspecified macular degeneration: Secondary | ICD-10-CM | POA: Diagnosis present

## 2020-12-19 LAB — CBC WITH DIFFERENTIAL/PLATELET
Abs Immature Granulocytes: 0.04 10*3/uL (ref 0.00–0.07)
Basophils Absolute: 0.1 10*3/uL (ref 0.0–0.1)
Basophils Relative: 1 %
Eosinophils Absolute: 0.2 10*3/uL (ref 0.0–0.5)
Eosinophils Relative: 2 %
HCT: 39.4 % (ref 39.0–52.0)
Hemoglobin: 13.4 g/dL (ref 13.0–17.0)
Immature Granulocytes: 0 %
Lymphocytes Relative: 16 %
Lymphs Abs: 1.6 10*3/uL (ref 0.7–4.0)
MCH: 32.1 pg (ref 26.0–34.0)
MCHC: 34 g/dL (ref 30.0–36.0)
MCV: 94.3 fL (ref 80.0–100.0)
Monocytes Absolute: 0.8 10*3/uL (ref 0.1–1.0)
Monocytes Relative: 8 %
Neutro Abs: 7.5 10*3/uL (ref 1.7–7.7)
Neutrophils Relative %: 73 %
Platelets: 194 10*3/uL (ref 150–400)
RBC: 4.18 MIL/uL — ABNORMAL LOW (ref 4.22–5.81)
RDW: 12.8 % (ref 11.5–15.5)
WBC: 10.2 10*3/uL (ref 4.0–10.5)
nRBC: 0 % (ref 0.0–0.2)

## 2020-12-19 LAB — COMPREHENSIVE METABOLIC PANEL
ALT: 15 U/L (ref 0–44)
AST: 16 U/L (ref 15–41)
Albumin: 4.1 g/dL (ref 3.5–5.0)
Alkaline Phosphatase: 68 U/L (ref 38–126)
Anion gap: 8 (ref 5–15)
BUN: 30 mg/dL — ABNORMAL HIGH (ref 8–23)
CO2: 31 mmol/L (ref 22–32)
Calcium: 9.7 mg/dL (ref 8.9–10.3)
Chloride: 98 mmol/L (ref 98–111)
Creatinine, Ser: 1.21 mg/dL (ref 0.61–1.24)
GFR, Estimated: >60 mL/min (ref >60)
Glucose, Bld: 99 mg/dL (ref 70–99)
Potassium: 3.7 mmol/L (ref 3.5–5.1)
Sodium: 137 mmol/L (ref 135–145)
Total Bilirubin: 1.3 mg/dL — ABNORMAL HIGH (ref 0.3–1.2)
Total Protein: 7 g/dL (ref 6.5–8.1)

## 2020-12-19 LAB — LACTIC ACID, PLASMA: Lactic Acid, Venous: 1.2 mmol/L (ref 0.5–1.9)

## 2020-12-19 LAB — RESP PANEL BY RT-PCR (FLU A&B, COVID) ARPGX2
Influenza A by PCR: NEGATIVE
Influenza B by PCR: NEGATIVE
SARS Coronavirus 2 by RT PCR: NEGATIVE

## 2020-12-19 MED ORDER — SODIUM CHLORIDE 0.9 % IV SOLN
2.0000 g | Freq: Once | INTRAVENOUS | Status: AC
  Administered 2020-12-19: 2 g via INTRAVENOUS
  Filled 2020-12-19: qty 2

## 2020-12-19 MED ORDER — ENOXAPARIN SODIUM 40 MG/0.4ML IJ SOSY
40.0000 mg | PREFILLED_SYRINGE | Freq: Once | INTRAMUSCULAR | Status: AC
  Administered 2020-12-19: 40 mg via SUBCUTANEOUS
  Filled 2020-12-19: qty 0.4

## 2020-12-19 MED ORDER — HYDROCODONE-ACETAMINOPHEN 5-325 MG PO TABS
1.0000 | ORAL_TABLET | Freq: Once | ORAL | Status: AC
  Administered 2020-12-19: 1 via ORAL
  Filled 2020-12-19: qty 1

## 2020-12-19 MED ORDER — VANCOMYCIN HCL IN DEXTROSE 1-5 GM/200ML-% IV SOLN
1000.0000 mg | Freq: Once | INTRAVENOUS | Status: AC
  Administered 2020-12-19: 1000 mg via INTRAVENOUS
  Filled 2020-12-19: qty 200

## 2020-12-19 MED ORDER — SODIUM CHLORIDE 0.9 % IV SOLN: INTRAVENOUS | Status: DC | PRN

## 2020-12-19 MED ORDER — FENTANYL CITRATE PF 50 MCG/ML IJ SOSY
50.0000 ug | PREFILLED_SYRINGE | Freq: Once | INTRAMUSCULAR | Status: AC
  Administered 2020-12-19: 50 ug via INTRAVENOUS
  Filled 2020-12-19: qty 1

## 2020-12-19 MED ORDER — METRONIDAZOLE 500 MG/100ML IV SOLN
500.0000 mg | Freq: Two times a day (BID) | INTRAVENOUS | Status: DC
  Administered 2020-12-19: 500 mg via INTRAVENOUS
  Filled 2020-12-19 (×2): qty 100

## 2020-12-19 MED ORDER — SODIUM CHLORIDE 0.9 % IV SOLN
2.0000 g | Freq: Two times a day (BID) | INTRAVENOUS | Status: DC
  Administered 2020-12-20 – 2020-12-23 (×9): 2 g via INTRAVENOUS
  Filled 2020-12-19 (×9): qty 2

## 2020-12-19 MED ORDER — VANCOMYCIN HCL 1250 MG/250ML IV SOLN
1250.0000 mg | INTRAVENOUS | Status: DC
  Administered 2020-12-20 – 2020-12-23 (×4): 1250 mg via INTRAVENOUS
  Filled 2020-12-19 (×6): qty 250

## 2020-12-19 MED ORDER — MORPHINE SULFATE (PF) 2 MG/ML IV SOLN
1.0000 mg | INTRAVENOUS | Status: DC | PRN
  Administered 2020-12-19 – 2020-12-20 (×3): 1 mg via INTRAVENOUS
  Filled 2020-12-19 (×3): qty 1

## 2020-12-19 MED ORDER — HYDROCODONE-ACETAMINOPHEN 5-325 MG PO TABS
1.0000 | ORAL_TABLET | Freq: Four times a day (QID) | ORAL | Status: DC | PRN
  Administered 2020-12-20 – 2020-12-25 (×9): 1 via ORAL
  Filled 2020-12-19 (×8): qty 1

## 2020-12-19 NOTE — Progress Notes (Signed)
Pharmacy Antibiotic Note  Lance Dillon is a 80 y.o. male admitted on 12/19/2020 with  foot wound .  Pharmacy has been consulted for vancomycin and cefepime dosing.  Patient with a history of CAD, COPD, history of prostate cancer and radiation therapy, PVD s/p rt fem pop bypass with Dr. Kellie Simmering, PCA aneurysm, renal artery stenosis. Patient presenting with sore in between his 4th and 5th toe on right side of foot. Pt seen by podiatrist, has been taking cipro. Pt felt his foot looked worse today.  SCr 1.21 WBC 10.2; LA 1.2; afeb  Plan: Metronidazole per MD Cefepime 2g q12hr Vancomycin 1000 mg given at [facility] will start 1250 mg q24hr (ASNK 539) unless change in renal function Trend WBC, Fever, Renal function, & Clinical course F/u cultures, clinical course, WBC, fever De-escalate when able Levels at steady state     Temp (24hrs), Avg:97.7 F (36.5 C), Min:97.7 F (36.5 C), Max:97.7 F (36.5 C)  No results for input(s): WBC, CREATININE, LATICACIDVEN, VANCOTROUGH, VANCOPEAK, VANCORANDOM, GENTTROUGH, GENTPEAK, GENTRANDOM, TOBRATROUGH, TOBRAPEAK, TOBRARND, AMIKACINPEAK, AMIKACINTROU, AMIKACIN in the last 168 hours.  CrCl cannot be calculated (Patient's most recent lab result is older than the maximum 21 days allowed.).    Allergies  Allergen Reactions   Avelox [Moxifloxacin Hcl In Nacl] Diarrhea    diarrhea   Moxifloxacin Diarrhea    diarrhea    Antimicrobials this admission: cefepime 11/20 >>  vancomycin 11/20 >>  metronidazole 11/20 >>   Microbiology results: Pending  Thank you for allowing pharmacy to be a part of this patient's care.  Lorelei Pont, PharmD, BCPS 12/19/2020 11:19 AM ED Clinical Pharmacist -  [phone]

## 2020-12-19 NOTE — Consult Note (Signed)
[facility]   ASSESSMENT / PLAN: Lance Dillon is a 80 y.o. male with atherosclerosis of native arteries of right lower extremity causing 4/5th interdigital ulceration.  Patient counseled patients with chronic limb threatening ischemia have an annual risk of cardiovascular mortality of 25% and a high risk of amputation.   Recommend the following which can slow the progression of atherosclerosis and reduce the risk of major adverse cardiac / limb events:  Complete cessation from all tobacco products. Blood glucose control with goal A1c < 7%. Blood pressure control with goal blood pressure < 140/90 mmHg. Lipid reduction therapy with goal LDL-C <100 mg/dL (<70 if symptomatic from PAD).  Aspirin 81mg  PO QD.  Atorvastatin 40-80mg  PO QD (or other "high intensity" statin therapy).  Plan right lower extremity angiogram with possible intervention in cath lab 12/20/20.  CHIEF COMPLAINT: right foot ulcer  HISTORY OF PRESENT ILLNESS: Lance Dillon is a 80 y.o. male who presents to the droppage emergency department for evaluation of a right interdigital foot ulceration that is developed over the past 2 weeks.  Patient is well-known to our service.  He also follows with Dr. Gwenlyn Found.  Patient reports typical ischemic rest pain symptoms over the past several months.  Over the past 2 weeks an ulcer has developed in his foot which is grown steadily worse.  Some erythema developed around the foot yesterday, prompting evaluation.  The patient is ambulatory.  He still is working, and hopes to continue to do so.  VASCULAR SURGICAL HISTORY: Right external iliac, common femoral, profunda femoris endarterectomy and dacryon patch angioplasty, right common femoral to below-knee popliteal with ipsilateral nonreversed greater saphenous vein (07/19/2011 Kellie Simmering)  VASCULAR RISK FACTORS: Negative history of stroke / transient ischemic attack. Positive history of coronary artery disease.   Negative history of diabetes mellitus.  Positive history of smoking. Not actively smoking. Positive history of hypertension.  Negative history of chronic kidney disease.  Last GFR >60.  Positive history of chronic obstructive pulmonary disease. Not on home oxygen.  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   Bilateral: Noted on MRA of brain in November 2015 with collateral flow from posterior circulation and external collaterals reconstituting anterior circulation   Cataract    Colon polyps 2004, 07, 14   COPD (chronic obstructive pulmonary disease) ([place])    Glaucoma    left eye    Heart murmur    No evidence of valvular lesion noted on Echo 01/2010   Hx of radiation therapy 03/22/11 to 05/08/11   PSA recurrent carcinoma of prostate   Hypercholesterolemia    Hypertension    Macular degeneration    Macular degeneration of both eyes    Peripheral vascular disease [facility]) June 2013   Bilateral SFA stenosis: Status post right femoropopliteal bypass - Dr. 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 mm x 8 mm fusiform aneurysm    Prostate cancer ([place]) 2004   Renal artery stenosis ([facility])    70% left   Stones in the urinary tract    Stroke [facility])    Urinary incontinence  Past Surgical History:  Procedure Laterality Date   ABDOMINAL AORTAGRAM N/A 06/20/2011   Procedure: ABDOMINAL AORTAGRAM;  Surgeon: Serafina Mitchell, MD;  Location: [facility] CATH LAB;  Service: Cardiovascular;  Laterality: N/A;   CARDIOVASCULAR STRESS TEST  07/06/2011   Evidence of mild ischemia in Basal  Inferior and Mid Inferior regions, no significant wall motion abnormalities noted.   clamp to penis     for urinary control   COLONOSCOPY     COLONOSCOPY W/ BIOPSIES     multiple    CYSTOSCOPY W/ URETERAL STENT PLACEMENT Left 06/07/2015   Procedure: CYSTOSCOPY, RETROGRADE PYLEGRAM  WITH STENT PLACEMENT;  Surgeon: Bjorn Loser, MD;  Location: [facility] ORS;  Service: Urology;  Laterality: Left;   CYSTOSCOPY WITH RETROGRADE PYELOGRAM, URETEROSCOPY AND STENT PLACEMENT Left 07/28/2015   Procedure: CYSTOSCOPY, URETEROSCOPY, WITH BASKET STONE REMOVAL;  Surgeon: Raynelle Bring, MD;  Location: [facility] ORS;  Service: Urology;  Laterality: Left;   EYE SURGERY Right Aug. 2016   Cataract; removed bilat   FEMORAL-POPLITEAL BYPASS GRAFT  07/19/2011   Procedure: BYPASS GRAFT FEMORAL-POPLITEAL ARTERY;  Surgeon: Mal Misty, MD;  Location: [place];  Service: Vascular;  Laterality: Right;  Right Femoral - Popliteal  Bypss with saphenous vein   HERNIA REPAIR     right side,lft   INTRAOPERATIVE ARTERIOGRAM  07/19/2011   Procedure: INTRA OPERATIVE ARTERIOGRAM;  Surgeon: Mal Misty, MD;  Location[facility] OR;  Service: Vascular;  Laterality: Right;   NM MYOVIEW LTD  June 2013   Normal EF, very small area of basal-mid inferior ischemia; LOW RISK   PENILE PROSTHESIS  REMOVAL     PENILE PROSTHESIS IMPLANT     PR VEIN BYPASS GRAFT,AORTO-FEM-POP  07/19/11   Right  Fem/Pop BPG   PROSTATE BIOPSY     prostatectomy retropubic radical  07/15/2002   with nerve sparing, Gleason 3+4=7   Reclast  July 25, 2013   RENAL DUPLEX  07/01/2012   Left renal artery 60-99% diameter reduction   TRANSTHORACIC ECHOCARDIOGRAM  January   Normal EF and overall function   TRANSTHORACIC ECHOCARDIOGRAM  02/23/2010   EF >61%, lv systolic function normal    Family History  Problem Relation Age of Onset   Prostate cancer Brother 46   Heart disease Brother        Before age 39   Hypertension Brother    Heart attack Brother    Aneurysm Father     Heart disease Father    Hypertension Father    Heart disease Mother        Before age 31   Hypertension Mother    Heart attack Mother    Heart disease Sister    Hypertension Sister    Heart attack Sister    Colon cancer Neg Hx    Esophageal cancer Neg Hx    Stomach cancer Neg Hx    Rectal cancer Neg Hx    Colon polyps Neg Hx     Social History   Socioeconomic History   Marital status: Married    Spouse name: Not on file   Number of children: Not on file   Years of education: Not on file   Highest education level: Not on file  Occupational History   Occupation: Therapist, occupational: [employer]  Tobacco Use   Smoking status: Former    Packs/day: 3.00    Years: 30.00    Pack years: 90.00    Types: Cigarettes  Quit date: 07/01/2002    Years since quitting: 18.4   Smokeless tobacco: Never  Vaping Use   Vaping Use: Never used  Substance and Sexual Activity   Alcohol use: No   Drug use: No   Sexual activity: Not on file  Other Topics Concern   Not on file  Social History Narrative   Married, 2 children , Oncologist - predominantly commercial, 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   Usually walks 2 miles maybe 2-3 days a week. Just not a cold weather.   He is a former smoker, quit "cold [place] "after 3 pack per day for 60 pack years in 2004   Social Determinants of Health   Financial Resource Strain: Not on file  Food Insecurity: Not on file  Transportation Needs: Not on file  Physical Activity: Not on file  Stress: Not on file  Social Connections: Not on file  Intimate Partner Violence: Not on file    Allergies  Allergen Reactions   Avelox [Moxifloxacin Hcl In Nacl] Diarrhea    diarrhea   Moxifloxacin Diarrhea    diarrhea    Current Facility-Administered Medications  Medication Dose Route Frequency Provider Last Rate Last Admin   0.9 %  sodium chloride infusion   Intravenous PRN Gareth Morgan, MD 10 mL/hr at  12/19/20 1151 New Bag at 12/19/20 1151   fentaNYL (SUBLIMAZE) injection 50 mcg  50 mcg Intravenous Once Gareth Morgan, MD       metroNIDAZOLE (FLAGYL) IVPB 500 mg  500 mg Intravenous Q12H Gareth Morgan, MD       vancomycin (VANCOCIN) IVPB 1000 mg/200 mL premix  1,000 mg Intravenous Once Gareth Morgan, MD 200 mL/hr at 12/19/20 1233 1,000 mg at 12/19/20 1233   Current Outpatient Medications  Medication Sig Dispense Refill   acetaminophen (TYLENOL) 500 MG tablet Take 500 mg by mouth every 6 (six) hours as needed.     amLODipine (NORVASC) 10 MG tablet TAKE 1 TABLET BY MOUTH EVERY DAY 90 tablet 1   aspirin 81 MG tablet Take 81 mg by mouth daily.     budesonide-formoterol (SYMBICORT) 80-4.5 MCG/ACT inhaler Inhale 2 puffs into the lungs 2 (two) times daily as needed. 1 each 2   Calcium Carbonate-Vitamin D (CALTRATE 600+D PO) Take 1 tablet by mouth daily.      cilostazol (PLETAL) 50 MG tablet Take 1 tablet (50 mg total) by mouth 2 (two) times daily. (Patient taking differently: Take 50 mg by mouth daily.) 180 tablet 3   Cyanocobalamin (VITAMIN B 12 PO) Take 2,500 mg by mouth daily.     fluticasone (FLONASE) 50 MCG/ACT nasal spray Place 2 sprays into both nostrils as needed (for nasal congestion).      hydrochlorothiazide (HYDRODIURIL) 12.5 MG tablet TAKE 1 TABLET BY MOUTH EVERY DAY ALONG WITH LOSARTAN  1   ibuprofen (ADVIL,MOTRIN) 200 MG tablet Take 200 mg by mouth every 6 (six) hours as needed.     latanoprost (XALATAN) 0.005 % ophthalmic solution Place 1 drop into the left eye at bedtime.      losartan (COZAAR) 100 MG tablet TAKE 1 TABLET BY MOUTH EVERY DAY ALONG WITH HCTZ 90 tablet 2   LUTEIN PO Take 1 tablet by mouth daily.     metoprolol succinate (TOPROL-XL) 25 MG 24 hr tablet TAKE 1 TABLET (25 MG TOTAL) BY MOUTH DAILY. ** DO NOT CRUSH ** (BETA BLOCKER) 90 tablet 3   Omega-3 Fatty Acids (FISH OIL) 1200 MG CAPS Take 3,600 mg  by mouth daily.     rosuvastatin (CRESTOR) 10 MG tablet TAKE 1  TABLET BY MOUTH EVERY DAY 90 tablet 3    REVIEW OF SYSTEMS:  [X]  denotes positive finding, [ ]  denotes negative finding Cardiac  Comments:  Chest pain or chest pressure:    Shortness of breath upon exertion:    Short of breath when lying flat:    Irregular heart rhythm:        Vascular    Pain in calf, thigh, or hip brought on by ambulation: x   Pain in feet at night that wakes you up from your sleep:  x   Blood clot in your veins:    Leg swelling:         Pulmonary    Oxygen at home:    Productive cough:     Wheezing:         Neurologic    Sudden weakness in arms or legs:     Sudden numbness in arms or legs:     Sudden onset of difficulty speaking or slurred speech:    Temporary loss of vision in one eye:     Problems with dizziness:         Gastrointestinal    Blood in stool:     Vomited blood:         Genitourinary    Burning when urinating:     Blood in urine:        Psychiatric    Major depression:         Hematologic    Bleeding problems:    Problems with blood clotting too easily:        Skin    Rashes or ulcers:        Constitutional    Fever or chills:      PHYSICAL EXAM Vitals:   12/19/20 1023 12/19/20 1154  BP: 101/67 109/71  Pulse: 94 83  Resp: 20 18  Temp: 97.7 F (36.5 C)   SpO2: 100% 100%   Constitutional: well appearing. no distress. Appears well nourished.  Neurologic: CN intact. no focal findings. no sensory loss. Psychiatric:  Mood and affect symmetric and appropriate. Eyes:  No icterus. No conjunctival pallor. Ears, nose, throat:  mucous membranes moist. Midline trachea.  Cardiac: regular rate and rhythm.  Respiratory: unlabored. Abdominal: soft, non-tender, non-distended.  Peripheral vascular: 1+ femoral pulses. Doppler flow in R PT. Extremity: No edema. No cyanosis. No pallor.  Skin: No gangrene. + ulceration.      Lymphatic: No Stemmer's sign. No palpable lymphadenopathy.  PERTINENT LABORATORY AND RADIOLOGIC  DATA  Most recent CBC CBC Latest Ref Rng & Units 12/19/2020 07/28/2015 06/10/2015  WBC 4.0 - 10.5 K/uL 10.2 5.4 6.1  Hemoglobin 13.0 - 17.0 g/dL 13.4 11.0(L) 10.3(L)  Hematocrit 39.0 - 52.0 % 39.4 31.9(L) 29.4(L)  Platelets 150 - 400 K/uL 194 152 109(L)     Most recent CMP CMP Latest Ref Rng & Units 12/19/2020 12/24/2018 11/25/2015  Glucose 70 - 99 mg/dL 99 - -  BUN 8 - 23 mg/dL 30(H) - -  Creatinine 0.61 - 1.24 mg/dL 1.21 - 1.51(H)  Sodium 135 - 145 mmol/L 137 - -  Potassium 3.5 - 5.1 mmol/L 3.7 - -  Chloride 98 - 111 mmol/L 98 - -  CO2 22 - 32 mmol/L 31 - -  Calcium 8.9 - 10.3 mg/dL 9.7 - -  Total Protein 6.5 - 8.1 g/dL 7.0 7.0 -  Total Bilirubin 0.3 - 1.2  mg/dL 1.3(H) 1.0 -  Alkaline Phos 38 - 126 U/L 68 60 -  AST 15 - 41 U/L 16 38 -  ALT 0 - 44 U/L 15 37 -    Renal function CrCl cannot be calculated (Unknown ideal weight.).  No results found for: HGBA1C  LDL Chol Calc (NIH)  Date Value Ref Range Status  12/24/2018 43 0 - 99 mg/dL Final    Yevonne Aline. Stanford Breed, MD [facility] Phone Number: [phone] 12/19/2020 1:31 PM  Total time spent on preparing this encounter including chart review, data review, collecting history, examining the patient, coordinating care for this new patient, 60 minutes.  Portions of this report may have been transcribed using voice recognition software.  Every effort has been made to ensure accuracy; however, inadvertent computerized transcription errors may still be present.

## 2020-12-19 NOTE — ED Triage Notes (Signed)
Pt has a sore in between his 4th and 5th toe on right side of foot. Pt seen by podiatrist, has been taking cipro. Pt felt his foot looked worse today. Pt reports poor circulation in bilateral legs, but his right calf feels more sore than usuajl.

## 2020-12-19 NOTE — ED Triage Notes (Signed)
Pt reports he does not take ibuprofen due to only having one kidney. He took some tylenol with a little relief but was told to back off that because of chance of liver damage.

## 2020-12-19 NOTE — Plan of Care (Signed)
  Problem: Education: Goal: Knowledge of General Education information will improve Description: Including pain rating scale, medication(s)/side effects and non-pharmacologic comfort measures Outcome: Progressing   Problem: Health Behavior/Discharge Planning: Goal: Ability to manage health-related needs will improve Outcome: Progressing   Problem: Clinical Measurements: Goal: Ability to maintain clinical measurements within normal limits will improve Outcome: Progressing Goal: Respiratory complications will improve Outcome: Progressing   Problem: Nutrition: Goal: Adequate nutrition will be maintained Outcome: Progressing   Problem: Coping: Goal: Level of anxiety will decrease Outcome: Progressing   Problem: Elimination: Goal: Will not experience complications related to bowel motility Outcome: Progressing

## 2020-12-19 NOTE — H&P (Signed)
History and Physical    Lance Dillon BMW:[id number] DOB: May 06, 1940 DOA: 12/19/2020  PCP: Lance Pretty, MD  Patient coming from: [place] ED  I have personally briefly reviewed patient's old medical records in [place]  Chief Complaint: infected foot ulcer  HPI: Lance Dillon is a 80 y.o. male with medical history significant for PAD, left renal artery stenosis, right carotid stenosis, cerebral aneurysm, COPD, CKD stage II, history of prostate cancer s/p prostatectomy, hypertension, hyperlipidemia.  Patient has noticed a lateral ulcer on the fourth digit of his right foot for the past 2-weeks.  Has been following with podiatry.  Notes that it progressively worsened in pain and had spreading erythema.  Today noticed darkening discoloration of the fourth toe.  Has had worsening pain and weakness of his entire right lower extremity for the past 2 days. He has history of right external iliac, femoral, profunda femoris endarterectomy and right femoral to popliteal bypass.  ED Course: He was afebrile normotensive and stable on room air.No leukocytosis or anemia.  BMP otherwise unremarkable. Right foot x-ray was otherwise negative.  He was evaluated by Dr. Stanford Breed with vascular surgery who will plan to take patient for right lower extremity angiogram with possible intervention in Cath Lab tomorrow.  Review of Systems: Constitutional: No Weight Change, No Fever ENT/Mouth: No sore throat, No Rhinorrhea Eyes: No Eye Pain, No Vision Changes Cardiovascular: No Chest Pain, no SOB, No PND, No Dyspnea on Exertion, No Orthopnea, No Claudication, No Edema, No Palpitations Respiratory: No Cough, No Sputum, No Wheezing, no Dyspnea  Gastrointestinal: No Nausea, No Vomiting, No Diarrhea, No Constipation, No Pain Genitourinary: no Urinary Incontinence, No Urgency, No Flank Pain Musculoskeletal: No Arthralgias, No Myalgias Skin: No Skin Lesions, No Pruritus, Neuro: no Weakness, No Numbness,   No Loss of Consciousness, No Syncope Psych: No Anxiety/Panic, No Depression, no decrease appetite Heme/Lymph: No Bruising, No Bleeding  Past Medical History:  Diagnosis Date   Arthritis    Atherosclerosis of aortic bifurcation and common iliac arteries [facility]) February 2017    Noted on abdominal/renal Dopplers   Blind right eye    Carotid artery, internal, occlusion November 2015   Bilateral: Noted on MRA of brain in November 2015 with collateral flow from posterior circulation and external collaterals reconstituting anterior circulation   Cataract    Colon polyps 2004, 07, 14   COPD (chronic obstructive pulmonary disease) (HCC)    Glaucoma    left eye    Heart murmur    No evidence of valvular lesion noted on Echo 01/2010   Hx of radiation therapy 03/22/11 to 05/08/11   PSA recurrent carcinoma of prostate   Hypercholesterolemia    Hypertension    Macular degeneration    Macular degeneration of both eyes    Peripheral vascular disease [facility]) June 2013   Bilateral SFA stenosis: Status post right femoropopliteal bypass - Dr. 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 mm x 8 mm fusiform aneurysm    Prostate cancer ([place]) 2004   Renal artery stenosis (HCC)    70% left   Stones in the urinary tract    Stroke [facility])    Urinary incontinence     Past Surgical History:  Procedure Laterality Date   ABDOMINAL AORTAGRAM N/A 06/20/2011   Procedure: ABDOMINAL Maxcine Ham;  Surgeon: Serafina Mitchell, MD;  Location: [facility] CATH LAB;  Service: Cardiovascular;  Laterality: N/A;   CARDIOVASCULAR STRESS TEST  07/06/2011   Evidence of  mild ischemia in Basal Inferior and Mid Inferior regions, no significant wall motion abnormalities noted.   clamp to penis     for urinary control   COLONOSCOPY     COLONOSCOPY W/ BIOPSIES     multiple    CYSTOSCOPY W/ URETERAL STENT PLACEMENT Left 06/07/2015   Procedure: CYSTOSCOPY, RETROGRADE PYLEGRAM  WITH STENT PLACEMENT;  Surgeon: Bjorn Loser, MD;  Location: [facility] ORS;  Service: Urology;  Laterality: Left;   CYSTOSCOPY WITH RETROGRADE PYELOGRAM, URETEROSCOPY AND STENT PLACEMENT Left 07/28/2015   Procedure: CYSTOSCOPY, URETEROSCOPY, WITH BASKET STONE REMOVAL;  Surgeon: Raynelle Bring, MD;  Location: [facility] ORS;  Service: Urology;  Laterality: Left;   EYE SURGERY Right Aug. 2016   Cataract; removed bilat   FEMORAL-POPLITEAL BYPASS GRAFT  07/19/2011   Procedure: BYPASS GRAFT FEMORAL-POPLITEAL ARTERY;  Surgeon: Mal Misty, MD;  Location: [facility];  Service: Vascular;  Laterality: Right;  Right Femoral - Popliteal  Bypss with saphenous vein   HERNIA REPAIR     right side,lft   INTRAOPERATIVE ARTERIOGRAM  07/19/2011   Procedure: INTRA OPERATIVE ARTERIOGRAM;  Surgeon: Mal Misty, MD;  Location: [place];  Service: Vascular;  Laterality: Right;   NM MYOVIEW LTD  June 2013   Normal EF, very small area of basal-mid inferior ischemia; LOW RISK   PENILE PROSTHESIS  REMOVAL     PENILE PROSTHESIS IMPLANT     PR VEIN BYPASS GRAFT,AORTO-FEM-POP  07/19/11   Right  Fem/Pop BPG   PROSTATE BIOPSY     prostatectomy retropubic radical  07/15/2002   with nerve sparing, Gleason 3+4=7   Reclast  July 25, 2013   RENAL DUPLEX  07/01/2012   Left renal artery 60-99% diameter reduction   TRANSTHORACIC ECHOCARDIOGRAM  January   Normal EF and overall function   TRANSTHORACIC ECHOCARDIOGRAM  02/23/2010   EF >14%, lv systolic function normal     reports that he quit smoking about 18 years ago. His smoking use included cigarettes. He has a 90.00 pack-year smoking history. He has never used smokeless tobacco. He reports that he does not drink alcohol and does not use drugs. Social History  Allergies  Allergen Reactions   Avelox [Moxifloxacin Hcl In Nacl] Diarrhea    diarrhea   Moxifloxacin Diarrhea    diarrhea    Family History  Problem Relation Age of Onset   Prostate cancer Brother 14   Heart disease Brother        Before age 37   Hypertension  Brother    Heart attack Brother    Aneurysm Father    Heart disease Father    Hypertension Father    Heart disease Mother        Before age 83   Hypertension Mother    Heart attack Mother    Heart disease Sister    Hypertension Sister    Heart attack Sister    Colon cancer Neg Hx    Esophageal cancer Neg Hx    Stomach cancer Neg Hx    Rectal cancer Neg Hx    Colon polyps Neg Hx      Prior to Admission medications   Medication Sig Start Date End Date Taking? Authorizing Provider  acetaminophen (TYLENOL) 500 MG tablet Take 500 mg by mouth every 6 (six) hours as needed.    [provider]  amLODipine (NORVASC) 10 MG tablet TAKE 1 TABLET BY MOUTH EVERY DAY 01/20/20   Lorretta Harp, MD  aspirin 81 MG tablet Take  81 mg by mouth daily.    [provider]  budesonide-formoterol (SYMBICORT) 80-4.5 MCG/ACT inhaler Inhale 2 puffs into the lungs 2 (two) times daily as needed. 04/23/20   Martyn Ehrich, NP  Calcium Carbonate-Vitamin D (CALTRATE 600+D PO) Take 1 tablet by mouth daily.     [provider]  cilostazol (PLETAL) 50 MG tablet Take 1 tablet (50 mg total) by mouth 2 (two) times daily. Patient taking differently: Take 50 mg by mouth daily. 02/24/20   Lorretta Harp, MD  Cyanocobalamin (VITAMIN B 12 PO) Take 2,500 mg by mouth daily.    [provider]  fluticasone (FLONASE) 50 MCG/ACT nasal spray Place 2 sprays into both nostrils as needed (for nasal congestion).  09/02/12   [provider]  hydrochlorothiazide (HYDRODIURIL) 12.5 MG tablet TAKE 1 TABLET BY MOUTH EVERY DAY ALONG WITH LOSARTAN 12/14/17   [provider]  ibuprofen (ADVIL,MOTRIN) 200 MG tablet Take 200 mg by mouth every 6 (six) hours as needed.    [provider]  latanoprost (XALATAN) 0.005 % ophthalmic solution Place 1 drop into the left eye at bedtime.  09/18/13   [provider]  losartan (COZAAR) 100 MG tablet TAKE 1 TABLET BY MOUTH EVERY DAY  ALONG WITH HCTZ 06/14/18   Leonie Man, MD  LUTEIN PO Take 1 tablet by mouth daily.    [provider]  metoprolol succinate (TOPROL-XL) 25 MG 24 hr tablet TAKE 1 TABLET (25 MG TOTAL) BY MOUTH DAILY. ** DO NOT CRUSH ** (BETA BLOCKER) 04/20/20   Lorretta Harp, MD  Omega-3 Fatty Acids (FISH OIL) 1200 MG CAPS Take 3,600 mg by mouth daily.    [provider]  rosuvastatin (CRESTOR) 10 MG tablet TAKE 1 TABLET BY MOUTH EVERY DAY 10/12/20   Lorretta Harp, MD    Physical Exam: Vitals:   12/19/20 1630 12/19/20 1801 12/19/20 1808 12/19/20 1809  BP: 106/60 124/83    Pulse: 88 90    Resp: 16     Temp:  98.3 F (36.8 C)    TempSrc:  Oral    SpO2: 100% 100%    Weight:    (S) 71.2 kg  Height:   5\' 8"  (1.727 m)     Constitutional: NAD, calm, comfortable nontoxic appearing elderly male lying flat in bed Vitals:   12/19/20 1630 12/19/20 1801 12/19/20 1808 12/19/20 1809  BP: 106/60 124/83    Pulse: 88 90    Resp: 16     Temp:  98.3 F (36.8 C)    TempSrc:  Oral    SpO2: 100% 100%    Weight:    (S) 71.2 kg  Height:   5\' 8"  (1.727 m)    Eyes: PERRL, lids and conjunctivae normal ENMT: Mucous membranes are moist.  Neck: normal, supple Respiratory: clear to auscultation bilaterally, no wheezing, no crackles. Normal respiratory effort. No accessory muscle use.  Cardiovascular: Regular rate and rhythm, no murmurs / rubs / gallops. No extremity edema. 2+ pedal pulses.  Abdomen: no tenderness, no masses palpated.  Bowel sounds positive.  Musculoskeletal: no clubbing / cyanosis. No joint deformity upper and lower extremities. Good ROM, no contractures. Normal muscle tone.  Skin: Discolored rated ischemic appearing right fourth digit with interdigit nonhealing ulcer and a spreading erythema up dorsal foot     Neurologic: CN 2-12 grossly intact. Sensation intact, Strength 5/5 in all 4.  Psychiatric: Normal judgment and insight. Alert and oriented x 3. Normal mood.  Labs on Admission: I have personally reviewed following labs and imaging studies  CBC: Recent Labs  Lab 12/19/20 1128  WBC 10.2  NEUTROABS 7.5  HGB 13.4  HCT 39.4  MCV 94.3  PLT 893   Basic Metabolic Panel: Recent Labs  Lab 12/19/20 1128  NA 137  K 3.7  CL 98  CO2 31  GLUCOSE 99  BUN 30*  CREATININE 1.21  CALCIUM 9.7   GFR: Estimated Creatinine Clearance: 47.1 mL/min (by C-G formula based on SCr of 1.21 mg/dL). Liver Function Tests: Recent Labs  Lab 12/19/20 1128  AST 16  ALT 15  ALKPHOS 68  BILITOT 1.3*  PROT 7.0  ALBUMIN 4.1   No results for input(s): LIPASE, AMYLASE in the last 168 hours. No results for input(s): AMMONIA in the last 168 hours. Coagulation Profile: No results for input(s): INR, PROTIME in the last 168 hours. Cardiac Enzymes: No results for input(s): CKTOTAL, CKMB, CKMBINDEX, TROPONINI in the last 168 hours. BNP (last 3 results) No results for input(s): PROBNP in the last 8760 hours. HbA1C: No results for input(s): HGBA1C in the last 72 hours. CBG: No results for input(s): GLUCAP in the last 168 hours. Lipid Profile: No results for input(s): CHOL, HDL, LDLCALC, TRIG, CHOLHDL, LDLDIRECT in the last 72 hours. Thyroid Function Tests: No results for input(s): TSH, T4TOTAL, FREET4, T3FREE, THYROIDAB in the last 72 hours. Anemia Panel: No results for input(s): VITAMINB12, FOLATE, FERRITIN, TIBC, IRON, RETICCTPCT in the last 72 hours. Urine analysis:    Component Value Date/Time   COLORURINE RED (A) 07/06/2015 0114   APPEARANCEUR CLOUDY (A) 07/06/2015 0114   LABSPEC 1.008 07/06/2015 0114   PHURINE 6.0 07/06/2015 0114   GLUCOSEU NEGATIVE 07/06/2015 0114   HGBUR LARGE (A) 07/06/2015 0114   BILIRUBINUR NEGATIVE 07/06/2015 0114   KETONESUR NEGATIVE 07/06/2015 0114   PROTEINUR 100 (A) 07/06/2015 0114   UROBILINOGEN 1.0 07/13/2011 0844   NITRITE NEGATIVE 07/06/2015 0114   LEUKOCYTESUR LARGE (A) 07/06/2015 0114    Radiological Exams  on Admission: DG Foot Complete Right  Result Date: 12/19/2020 CLINICAL DATA:  Soreness in the fourth and fifth toes. EXAM: RIGHT FOOT COMPLETE - 3+ VIEW COMPARISON:  None. FINDINGS: There is no evidence of fracture or dislocation. There is no evidence of arthropathy or other focal bone abnormality. Soft tissues are unremarkable. IMPRESSION: Negative. Electronically Signed   By: Misty Stanley M.D.   On: 12/19/2020 11:21      Assessment/Plan  Ischemic right 4th toe with non-healing lateral interdigit ulcer and celluitis  History of PAD/chronic claudication/right femoral to popliteal bypass -Has received IV vancomycin, cefepime and Flagyl in the ED.  We will continue with IV vancomycin and cefepime. -Vascular surgery Dr. Stanford Breed has evaluated in a.m. plans for right lower extremity angiogram with possible intervention tomorrow -N.p.o. past midnight -PRN hydrocodone for mild pain and IV morphine for severe pain  CKD stage II History of left renal artery stenosis - Creatinine is stable at 1.2  HTN  Controlled. Resume antihypertensives once he is able to verify his medications  COPD Not in acute exacerbation  History of cerebral aneurysm -Asymptomatic.  Unchanged on recent MRA head in August  DVT prophylaxis:.Lovenox x 1. Can resume after angiogram tomorrow Code Status: Full Family Communication: Plan discussed with patient at bedside  disposition Plan: Home with at least 2 midnight stays  Consults called: Vascular surgery Admission status: inpatient Level of care: Med-Surg  Status is: Inpatient  Remains inpatient appropriate because: Ischemic right foot wound  requiring vascular consult         Orene Desanctis DO [facility]   If 7PM-7AM, please contact night-coverage [email or web address]   12/19/2020, 9:27 PM

## 2020-12-19 NOTE — ED Notes (Signed)
Patient given decaf coffee with MD permission, patient's family has gone to get patient food. Patient assisted to restroom with Filiberto Pinks without incident.  Patient denies any needs at this time.

## 2020-12-19 NOTE — ED Notes (Signed)
Patient assisted to bathroom via Filiberto Pinks without incident.

## 2020-12-19 NOTE — ED Notes (Signed)
Report given to [facility] 

## 2020-12-19 NOTE — ED Provider Notes (Signed)
[facility] EMERGENCY DEPT Provider Note   CSN: [id number] Arrival date & time: 12/19/20  1000     History Chief Complaint  Patient presents with   Foot Injury    Lance Dillon is a 80 y.o. male.  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 Kellie Simmering, PCA aneurysm, renal artery stenosis who presents with concern for wound ulceration between his fourth and fifth toe on his right foot with worsening erythema and pain despite taking Cipro.  Reports it has been there for the last 2 weeks, with unclear injury to the area.  He saw his podiatrist Dr. Fritzi Mandes at [facility] on November 10, and was diagnosed with cellulitis and initiated on ciprofloxacin.  He has taken it over the last week without any improvement, and reports worsening of redness of his foot, now extending towards his ankle.  Has pain in his right calf as well as his right foot.  Has numbness of his toes in his bilateral lower extremities.  Denies fevers, nausea, vomiting, diarrhea, abdominal pain or other symptoms   Per Dr. Gwenlyn Found who has been following him for PVD Has had prior angiography performed by Dr. Trula Slade in 2013 and went right femoral-popliteal bypass with Dr. Kellie Simmering.  PAD dopplers fevealing ABIs in the .6 range bilaterally with most likely occluded SFAs bilaterally too.   Past Medical History:  Diagnosis Date   Arthritis    Atherosclerosis of aortic bifurcation and common iliac arteries [facility]) February 2017    Noted on abdominal/renal Dopplers   Blind right eye    Carotid artery, internal, occlusion November 2015   Bilateral: Noted on MRA of brain in November 2015 with collateral flow from posterior circulation and external collaterals reconstituting  anterior circulation   Cataract    Colon polyps 2004, 07, 14   COPD (chronic obstructive pulmonary disease) (HCC)    Glaucoma    left eye    Heart murmur    No evidence of valvular lesion noted on Echo 01/2010   Hx of radiation therapy 03/22/11 to 05/08/11   PSA recurrent carcinoma of prostate   Hypercholesterolemia    Hypertension    Macular degeneration    Macular degeneration of both eyes    Peripheral vascular disease [facility]) June 2013   Bilateral SFA stenosis: Status post right femoropopliteal bypass - Dr. 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 08/13/2019   Retinal layer separation 08/13/2019   Primary open angle glaucoma of both eyes, severe stage 08/13/2019   Rhinitis, chronic 03/12/2018   Degeneration of lumbar intervertebral disc 08/28/2017   Lung field abnormal finding on examination 07/19/2017   COPD GOLD II with symptoms more typical of AB/cough variant asthma 07/19/2017   Carotid occlusion, right 08/11/2015  Aneurysm, cerebral 08/11/2015   Hydronephrosis, left 06/06/2015   Kidney disease, chronic, 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   Dizziness 12/16/2013   Vertigo    Left renal artery stenosis - 70% by angiography; increased velocity on recent Dopplers (60-99%) 12/08/2012    Class:  Diagnosis of   Hyperlipidemia LDL goal <70 12/08/2012   Personal history of colonic adenomas 11/15/2012   Pain in limb 02/13/2012   Peripheral vascular disease ([place]) 08/07/2011   Atherosclerosis of native artery of extremity with intermittent claudication ([place]) 06/13/2011   Prostate cancer ([place]) 02/16/2011   Essential hypertension 12/09/2010    Past Surgical History:  Procedure Laterality Date   ABDOMINAL AORTAGRAM N/A 06/20/2011   Procedure: ABDOMINAL Maxcine Ham;  Surgeon: Serafina Mitchell, MD;  Location: [facility] CATH LAB;  Service: Cardiovascular;  Laterality: N/A;   CARDIOVASCULAR STRESS TEST  07/06/2011   Evidence of mild ischemia in Basal Inferior and Mid Inferior regions, no significant wall motion abnormalities noted.   clamp to penis     for urinary control   COLONOSCOPY     COLONOSCOPY W/ BIOPSIES     multiple    CYSTOSCOPY W/ URETERAL STENT PLACEMENT Left 06/07/2015   Procedure: CYSTOSCOPY, RETROGRADE PYLEGRAM  WITH STENT PLACEMENT;  Surgeon: Bjorn Loser, MD;  Location: [facility] ORS;  Service: Urology;  Laterality: Left;   CYSTOSCOPY WITH RETROGRADE PYELOGRAM, URETEROSCOPY AND STENT PLACEMENT Left 07/28/2015   Procedure: CYSTOSCOPY, URETEROSCOPY, WITH BASKET STONE REMOVAL;  Surgeon: Raynelle Bring, MD;  Location: [facility] ORS;  Service: Urology;  Laterality: Left;   EYE SURGERY Right Aug. 2016   Cataract; removed bilat   FEMORAL-POPLITEAL BYPASS GRAFT  07/19/2011   Procedure: BYPASS GRAFT FEMORAL-POPLITEAL ARTERY;  Surgeon: Mal Misty, MD;  Location: [place];  Service: Vascular;  Laterality: Right;  Right Femoral - Popliteal  Bypss with saphenous vein   HERNIA REPAIR     right side,lft   INTRAOPERATIVE ARTERIOGRAM  07/19/2011   Procedure: INTRA OPERATIVE ARTERIOGRAM;  Surgeon: Mal Misty, MD;  Location: [facility];  Service: Vascular;  Laterality: Right;   NM MYOVIEW LTD  June 2013   Normal EF, very small area of basal-mid inferior ischemia; LOW RISK   PENILE PROSTHESIS  REMOVAL     PENILE  PROSTHESIS IMPLANT     PR VEIN BYPASS GRAFT,AORTO-FEM-POP  07/19/11   Right  Fem/Pop BPG   PROSTATE BIOPSY     prostatectomy retropubic radical  07/15/2002   with nerve sparing, Gleason 3+4=7   Reclast  July 25, 2013   RENAL DUPLEX  07/01/2012   Left renal artery 60-99% diameter reduction   TRANSTHORACIC ECHOCARDIOGRAM  January   Normal EF and overall function   TRANSTHORACIC ECHOCARDIOGRAM  02/23/2010   EF >86%, lv systolic function normal       Family History  Problem Relation Age of Onset   Prostate cancer Brother 12   Heart disease Brother        Before age 82   Hypertension Brother    Heart attack Brother    Aneurysm Father    Heart disease Father    Hypertension Father    Heart disease Mother        Before age 67   Hypertension Mother    Heart attack Mother    Heart disease Sister  Hypertension Sister    Heart attack Sister    Colon cancer Neg Hx    Esophageal cancer Neg Hx    Stomach cancer Neg Hx    Rectal cancer Neg Hx    Colon polyps Neg Hx     Social History   Tobacco Use   Smoking status: Former    Packs/day: 3.00    Years: 30.00    Pack years: 90.00    Types: Cigarettes    Quit date: 07/01/2002    Years since quitting: 18.4   Smokeless tobacco: Never  Vaping Use   Vaping Use: Never used  Substance Use Topics   Alcohol use: No   Drug use: No    Home Medications Prior to Admission medications   Medication Sig Start Date End Date Taking? Authorizing Provider  acetaminophen (TYLENOL) 500 MG tablet Take 500 mg by mouth every 6 (six) hours as needed.    [provider]  amLODipine (NORVASC) 10 MG tablet TAKE 1 TABLET BY MOUTH EVERY DAY 01/20/20   Lorretta Harp, MD  aspirin 81 MG tablet Take 81 mg by mouth daily.    [provider]  budesonide-formoterol (SYMBICORT) 80-4.5 MCG/ACT inhaler Inhale 2 puffs into the lungs 2 (two) times daily as needed. 04/23/20   Martyn Ehrich, NP  Calcium Carbonate-Vitamin D (CALTRATE 600+D  PO) Take 1 tablet by mouth daily.     [provider]  cilostazol (PLETAL) 50 MG tablet Take 1 tablet (50 mg total) by mouth 2 (two) times daily. Patient taking differently: Take 50 mg by mouth daily. 02/24/20   Lorretta Harp, MD  Cyanocobalamin (VITAMIN B 12 PO) Take 2,500 mg by mouth daily.    [provider]  fluticasone (FLONASE) 50 MCG/ACT nasal spray Place 2 sprays into both nostrils as needed (for nasal congestion).  09/02/12   [provider]  hydrochlorothiazide (HYDRODIURIL) 12.5 MG tablet TAKE 1 TABLET BY MOUTH EVERY DAY ALONG WITH LOSARTAN 12/14/17   [provider]  ibuprofen (ADVIL,MOTRIN) 200 MG tablet Take 200 mg by mouth every 6 (six) hours as needed.    [provider]  latanoprost (XALATAN) 0.005 % ophthalmic solution Place 1 drop into the left eye at bedtime.  09/18/13   [provider]  losartan (COZAAR) 100 MG tablet TAKE 1 TABLET BY MOUTH EVERY DAY ALONG WITH HCTZ 06/14/18   Leonie Man, MD  LUTEIN PO Take 1 tablet by mouth daily.    [provider]  metoprolol succinate (TOPROL-XL) 25 MG 24 hr tablet TAKE 1 TABLET (25 MG TOTAL) BY MOUTH DAILY. ** DO NOT CRUSH ** (BETA BLOCKER) 04/20/20   Lorretta Harp, MD  Omega-3 Fatty Acids (FISH OIL) 1200 MG CAPS Take 3,600 mg by mouth daily.    [provider]  rosuvastatin (CRESTOR) 10 MG tablet TAKE 1 TABLET BY MOUTH EVERY DAY 10/12/20   Lorretta Harp, MD    Allergies    Avelox [moxifloxacin hcl in nacl] and Moxifloxacin  Review of Systems   Review of Systems  Constitutional:  Negative for fever.  HENT:  Negative for sore throat.   Eyes:  Negative for visual disturbance.  Respiratory:  Negative for shortness of breath.   Cardiovascular:  Negative for chest pain.  Gastrointestinal:  Negative for abdominal pain, nausea and vomiting.  Genitourinary:  Negative for difficulty urinating.  Musculoskeletal:  Positive for arthralgias and neck pain.  Negative for back pain and neck stiffness.  Skin:  Positive  for rash and wound.  Neurological:  Negative for syncope and headaches.   Physical Exam Updated Vital Signs BP 121/66 (BP Location: Right Arm)   Pulse 78   Temp 97.7 F (36.5 C)   Resp 16   SpO2 98%   Physical Exam Vitals and nursing note reviewed.  Constitutional:      General: He is not in acute distress.    Appearance: He is well-developed. He is not diaphoretic.  HENT:     Head: Normocephalic and atraumatic.  Eyes:     Conjunctiva/sclera: Conjunctivae normal.  Cardiovascular:     Rate and Rhythm: Normal rate and regular rhythm.     Heart sounds: Normal heart sounds. No murmur heard.   No friction rub. No gallop.  Pulmonary:     Effort: Pulmonary effort is normal. No respiratory distress.     Breath sounds: Normal breath sounds. No wheezing or rales.  Abdominal:     General: There is no distension.     Palpations: Abdomen is soft.     Tenderness: There is no abdominal tenderness. There is no guarding.  Musculoskeletal:        General: Tenderness (right foot) present.     Cervical back: Normal range of motion.     Comments: Monophasic doppler signal bilatera LE faint, biphasic faint doppler signal popliteal  Skin:    General: Skin is warm and dry.     Capillary Refill: Sluggish capillary refill to distal toes but present Neurological:     Mental Status: He is alert and oriented to person, place, and time.       ED Results / Procedures / Treatments   Labs (all labs ordered are listed, but only abnormal results are displayed) Labs Reviewed  CBC WITH DIFFERENTIAL/PLATELET - Abnormal; Notable for the following components:      Result Value   RBC 4.18 (*)    All other components within normal limits  COMPREHENSIVE METABOLIC PANEL - Abnormal; Notable for the following components:   BUN 30 (*)    Total Bilirubin 1.3 (*)    All other components within normal limits  RESP PANEL BY RT-PCR (FLU A&B, COVID)  ARPGX2  CULTURE, BLOOD (ROUTINE X 2)  CULTURE, BLOOD (ROUTINE X 2)  LACTIC ACID, PLASMA    EKG None  Radiology DG Foot Complete Right  Result Date: 12/19/2020 CLINICAL DATA:  Soreness in the fourth and fifth toes. EXAM: RIGHT FOOT COMPLETE - 3+ VIEW COMPARISON:  None. FINDINGS: There is no evidence of fracture or dislocation. There is no evidence of arthropathy or other focal bone abnormality. Soft tissues are unremarkable. IMPRESSION: Negative. Electronically Signed   By: Misty Stanley M.D.   On: 12/19/2020 11:21    Procedures Procedures   Medications Ordered in ED Medications  metroNIDAZOLE (FLAGYL) IVPB 500 mg (500 mg Intravenous New Bag/Given 12/19/20 1354)  0.9 %  sodium chloride infusion ( Intravenous New Bag/Given 12/19/20 1151)  ceFEPIme (MAXIPIME) 2 g in sodium chloride 0.9 % 100 mL IVPB (has no administration in time range)  vancomycin (VANCOREADY) IVPB 1250 mg/250 mL (has no administration in time range)  vancomycin (VANCOCIN) IVPB 1000 mg/200 mL premix (0 mg Intravenous Stopped 12/19/20 1350)  ceFEPIme (MAXIPIME) 2 g in sodium chloride 0.9 % 100 mL IVPB (0 g Intravenous Stopped 12/19/20 1223)  fentaNYL (SUBLIMAZE) injection 50 mcg (50 mcg Intravenous Given 12/19/20 1354)    ED Course  I have reviewed the triage vital signs and the nursing notes.  Pertinent labs &  imaging results that were available during my care of the patient were reviewed by me and considered in my medical decision making (see chart for details).    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 Kellie Simmering, PCA aneurysm, renal artery stenosis who presents with concern for wound ulceration between his fourth and fifth toe on his right  foot with worsening erythema and pain despite taking Cipro.  Has significant PVD and only faint doppler signals bilaterally and concern this is contributing to his right foot wounds and inability to heal.  Has surrounding worsening cellulitis despite outpatient antibiotics and is appropriate for IV antibiotics and inpatient treatment.  Consulted Vascular surgery Dr. Stanford Breed, and contacted Cardiology given Dr. Gwenlyn Found follows however Dr. Gwenlyn Found is not available over the next week and given more acute concerns, contacted Dr. Stanford Breed who came to the bedside to evaluate Mr. Ratajczak. Will plan on admission to [facility] service for continued treatment of cellulitis, likely angiography tomorrow.   Final Clinical Impression(s) / ED Diagnoses Final diagnoses:  Cellulitis of right lower extremity  Atherosclerosis of bypass graft of right lower extremity with ulceration of other part of foot [facility])    Rx / DC Orders ED Discharge Orders     None        Gareth Morgan, MD 12/19/20 1556

## 2020-12-20 ENCOUNTER — Encounter (HOSPITAL_COMMUNITY)
Admission: EM | Disposition: A | Discharge status: Home Health | Payer: Self-pay | Source: Home / Self Care | Attending: Internal Medicine

## 2020-12-20 ENCOUNTER — Encounter (HOSPITAL_COMMUNITY): Payer: Self-pay | Admitting: Internal Medicine

## 2020-12-20 DIAGNOSIS — I70235 Atherosclerosis of native arteries of right leg with ulceration of other part of foot: Secondary | ICD-10-CM

## 2020-12-20 DIAGNOSIS — Z9889 Other specified postprocedural states: Secondary | ICD-10-CM

## 2020-12-20 HISTORY — PX: ABDOMINAL AORTOGRAM W/LOWER EXTREMITY: CATH118223

## 2020-12-20 LAB — BASIC METABOLIC PANEL
Anion gap: 7 (ref 5–15)
BUN: 25 mg/dL — ABNORMAL HIGH (ref 8–23)
CO2: 25 mmol/L (ref 22–32)
Calcium: 8.2 mg/dL — ABNORMAL LOW (ref 8.9–10.3)
Chloride: 101 mmol/L (ref 98–111)
Creatinine, Ser: 1.27 mg/dL — ABNORMAL HIGH (ref 0.61–1.24)
GFR, Estimated: 57 mL/min — ABNORMAL LOW (ref >60)
Glucose, Bld: 112 mg/dL — ABNORMAL HIGH (ref 70–99)
Potassium: 3.5 mmol/L (ref 3.5–5.1)
Sodium: 133 mmol/L — ABNORMAL LOW (ref 135–145)

## 2020-12-20 LAB — CBC
HCT: 34.3 % — ABNORMAL LOW (ref 39.0–52.0)
Hemoglobin: 11.6 g/dL — ABNORMAL LOW (ref 13.0–17.0)
MCH: 32.2 pg (ref 26.0–34.0)
MCHC: 33.8 g/dL (ref 30.0–36.0)
MCV: 95.3 fL (ref 80.0–100.0)
Platelets: 153 10*3/uL (ref 150–400)
RBC: 3.6 MIL/uL — ABNORMAL LOW (ref 4.22–5.81)
RDW: 12.7 % (ref 11.5–15.5)
WBC: 7 10*3/uL (ref 4.0–10.5)
nRBC: 0 % (ref 0.0–0.2)

## 2020-12-20 SURGERY — ABDOMINAL AORTOGRAM W/LOWER EXTREMITY
Anesthesia: LOCAL

## 2020-12-20 MED ORDER — HEPARIN (PORCINE) IN NACL 1000-0.9 UT/500ML-% IV SOLN
INTRAVENOUS | Status: DC | PRN
  Administered 2020-12-20 (×2): 500 mL

## 2020-12-20 MED ORDER — ASPIRIN EC 81 MG PO TBEC
81.0000 mg | DELAYED_RELEASE_TABLET | Freq: Every day | ORAL | Status: DC
  Administered 2020-12-20 – 2020-12-25 (×5): 81 mg via ORAL
  Filled 2020-12-20 (×5): qty 1

## 2020-12-20 MED ORDER — SODIUM CHLORIDE 0.9 % IV SOLN: 250.0000 mL | INTRAVENOUS | Status: DC | PRN

## 2020-12-20 MED ORDER — FLUTICASONE PROPIONATE 50 MCG/ACT NA SUSP
2.0000 | NASAL | Status: DC | PRN
  Filled 2020-12-20: qty 16

## 2020-12-20 MED ORDER — HEPARIN (PORCINE) IN NACL 1000-0.9 UT/500ML-% IV SOLN
INTRAVENOUS | Status: AC
  Filled 2020-12-20: qty 1000

## 2020-12-20 MED ORDER — FENTANYL CITRATE (PF) 100 MCG/2ML IJ SOLN
INTRAMUSCULAR | Status: AC
  Filled 2020-12-20: qty 2

## 2020-12-20 MED ORDER — LIDOCAINE HCL (PF) 1 % IJ SOLN
INTRAMUSCULAR | Status: AC
  Filled 2020-12-20: qty 30

## 2020-12-20 MED ORDER — SODIUM CHLORIDE 0.9% FLUSH: 3.0000 mL | INTRAVENOUS | Status: DC | PRN

## 2020-12-20 MED ORDER — IODIXANOL 320 MG/ML IV SOLN
INTRAVENOUS | Status: DC | PRN
  Administered 2020-12-20: 80 mL

## 2020-12-20 MED ORDER — ROSUVASTATIN CALCIUM 5 MG PO TABS
10.0000 mg | ORAL_TABLET | Freq: Every day | ORAL | Status: DC
  Administered 2020-12-20 – 2020-12-25 (×5): 10 mg via ORAL
  Filled 2020-12-20 (×5): qty 2

## 2020-12-20 MED ORDER — HYDRALAZINE HCL 20 MG/ML IJ SOLN: 5.0000 mg | INTRAMUSCULAR | Status: DC | PRN

## 2020-12-20 MED ORDER — ACETAMINOPHEN 325 MG PO TABS: 650.0000 mg | ORAL_TABLET | ORAL | Status: DC | PRN

## 2020-12-20 MED ORDER — ONDANSETRON HCL 4 MG/2ML IJ SOLN: 4.0000 mg | Freq: Four times a day (QID) | INTRAMUSCULAR | Status: DC | PRN

## 2020-12-20 MED ORDER — SODIUM CHLORIDE 0.9% FLUSH
3.0000 mL | Freq: Two times a day (BID) | INTRAVENOUS | Status: DC
  Administered 2020-12-21 – 2020-12-25 (×7): 3 mL via INTRAVENOUS

## 2020-12-20 MED ORDER — MIDAZOLAM HCL 2 MG/2ML IJ SOLN
INTRAMUSCULAR | Status: DC | PRN
  Administered 2020-12-20 (×2): 1 mg via INTRAVENOUS

## 2020-12-20 MED ORDER — FENTANYL CITRATE (PF) 100 MCG/2ML IJ SOLN
INTRAMUSCULAR | Status: DC | PRN
  Administered 2020-12-20 (×2): 25 ug via INTRAVENOUS

## 2020-12-20 MED ORDER — MIDAZOLAM HCL 2 MG/2ML IJ SOLN
INTRAMUSCULAR | Status: AC
  Filled 2020-12-20: qty 2

## 2020-12-20 MED ORDER — SODIUM CHLORIDE 0.9 % IV SOLN: INTRAVENOUS | Status: AC

## 2020-12-20 MED ORDER — SODIUM CHLORIDE 0.9 % IV SOLN: INTRAVENOUS | Status: DC

## 2020-12-20 MED ORDER — MOMETASONE FURO-FORMOTEROL FUM 100-5 MCG/ACT IN AERO
2.0000 | INHALATION_SPRAY | Freq: Two times a day (BID) | RESPIRATORY_TRACT | Status: DC
  Administered 2020-12-20 – 2020-12-25 (×6): 2 via RESPIRATORY_TRACT
  Filled 2020-12-20 (×2): qty 8.8

## 2020-12-20 MED ORDER — HYDROCODONE-ACETAMINOPHEN 5-325 MG PO TABS
ORAL_TABLET | ORAL | Status: AC
  Filled 2020-12-20: qty 1

## 2020-12-20 MED ORDER — LABETALOL HCL 5 MG/ML IV SOLN: 10.0000 mg | INTRAVENOUS | Status: DC | PRN

## 2020-12-20 MED ORDER — LIDOCAINE HCL (PF) 1 % IJ SOLN
INTRAMUSCULAR | Status: DC | PRN
  Administered 2020-12-20 (×2): 20 mL

## 2020-12-20 SURGICAL SUPPLY — 9 items
CATH OMNI FLUSH 5F 65CM (CATHETERS) ×2 IMPLANT
KIT MICROPUNCTURE NIT STIFF (SHEATH) ×2 IMPLANT
KIT PV (KITS) ×2 IMPLANT
SHEATH PINNACLE 5F 10CM (SHEATH) ×2 IMPLANT
SHEATH PROBE COVER 6X72 (BAG) ×2 IMPLANT
SYR MEDRAD MARK V 150ML (SYRINGE) ×2 IMPLANT
TRANSDUCER W/STOPCOCK (MISCELLANEOUS) ×2 IMPLANT
TRAY PV CATH (CUSTOM PROCEDURE TRAY) ×2 IMPLANT
WIRE BENTSON .035X145CM (WIRE) ×2 IMPLANT

## 2020-12-20 NOTE — Progress Notes (Signed)
[facility] [place] PROGRESS NOTE  ASSESSMENT / PLAN: Lance Dillon is a 80 y.o. male with atherosclerosis of native arteries of right lower extremity causing 4/5th interdigital ulceration.  Plan right lower extremity angiogram with possible intervention in cath lab 12/20/20.   SUBJECTIVE: No interval changes. Reviewed plan for today.  OBJECTIVE: BP 102/66   Pulse 79   Temp 98.8 F (37.1 C) (Oral)   Resp 18   Ht 5\' 8"  (1.727 m)   Wt 71.2 kg   SpO2 98%   BMI 23.87 kg/m   Intake/Output Summary (Last 24 hours) at 12/20/2020 1137 Last data filed at 12/20/2020 1034 Gross per 24 hour  Intake 919.49 ml  Output 150 ml  Net 769.49 ml    No distress Regular rate and rhythm Unlabored breathing Unchanged appearance of right 4/5 toe webspace  CBC Latest Ref Rng & Units 12/20/2020 12/19/2020 07/28/2015  WBC 4.0 - 10.5 K/uL 7.0 10.2 5.4  Hemoglobin 13.0 - 17.0 g/dL 11.6(L) 13.4 11.0(L)  Hematocrit 39.0 - 52.0 % 34.3(L) 39.4 31.9(L)  Platelets 150 - 400 K/uL 153 194 152     CMP Latest Ref Rng & Units 12/20/2020 12/19/2020 12/24/2018  Glucose 70 - 99 mg/dL 112(H) 99 -  BUN 8 - 23 mg/dL 25(H) 30(H) -  Creatinine 0.61 - 1.24 mg/dL 1.27(H) 1.21 -  Sodium 135 - 145 mmol/L 133(L) 137 -  Potassium 3.5 - 5.1 mmol/L 3.5 3.7 -  Chloride 98 - 111 mmol/L 101 98 -  CO2 22 - 32 mmol/L 25 31 -  Calcium 8.9 - 10.3 mg/dL 8.2(L) 9.7 -  Total Protein 6.5 - 8.1 g/dL - 7.0 7.0  Total Bilirubin 0.3 - 1.2 mg/dL - 1.3(H) 1.0  Alkaline Phos 38 - 126 U/L - 68 60  AST 15 - 41 U/L - 16 38  ALT 0 - 44 U/L - 15 37    Estimated Creatinine Clearance: 44.9 mL/min (A) (by C-G formula based on SCr of 1.27 mg/dL (H)).  Yevonne Aline. Stanford Breed, MD [facility] Phone Number: [phone] 12/20/2020 11:37 AM

## 2020-12-20 NOTE — Progress Notes (Signed)
PROGRESS NOTE  Lance Dillon LHT:[id number] DOB: 09/15/1940 DOA: 12/19/2020 PCP: Deland Pretty, MD   LOS: 1 day   Brief narrative:   Lance Dillon is a 80 y.o. male with past medical history of peripheral artery disease, renal artery stenosis, carotid stenosis, cerebral aneurysm, COPD, CKD stage II, history of prostate cancer status post prostatectomy, hypertension and hyperlipidemia presented to hospital with ulceration in his right foot for the last 2 weeks.  Patient had been following up with podiatry but had been having worsening pain in spreading erythema.  Patient does have history of right external iliac fever profunda femoris endarterectomy and right femoral to popliteal bypass in the past.  In the ED, patient was afebrile, normotensive.  No leukocytosis.  Foot x-ray was unremarkable.  Patient was seen by Dr. Stanford Breed with vascular surgery who has plans for lower extremity angiogram with possible intervention on 12/20/2020,.  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   History of right femoral-popliteal bypass.   Continue IV vancomycin and cefepime.  Vascular surgery awaiting for renal angiogram and possible intervention.  Continue supportive care.  CKD stage II with previous history of left renal artery stenosis. Creatinine at 1.2.   Essential HTN  Blood pressure stable at this time.   COPD Did not appear to be in exacerbation.  Resume inhalers from home.   History of cerebral aneurysm -Asymptomatic.  Unchanged on recent MRA head in August  DVT prophylaxis:    Code Status: Full code  Family Communication: Spoke with the patient at bedside.  Status is:  Inpatient  Remains inpatient appropriate because: Need for further diagnostic testing, vascular surgery evaluation, IV antibiotics   Consultants: Vascular surgery  Procedures: None yet  Anti-infectives:  Vancomycin and cefepime   Anti-infectives (From admission, onward)    Start     Dose/Rate Route Frequency Ordered Stop   12/20/20 0800  vancomycin (VANCOREADY) IVPB 1250 mg/250 mL        1,250 mg 166.7 mL/hr over 90 Minutes Intravenous Every 24 hours 12/19/20 1413     12/20/20 0000  ceFEPIme (MAXIPIME) 2 g in sodium chloride 0.9 % 100 mL IVPB        2 g 200 mL/hr over 30 Minutes Intravenous Every 12 hours 12/19/20 1413     12/19/20 1145  vancomycin (VANCOCIN) IVPB 1000 mg/200 mL premix        1,000 mg 200 mL/hr over 60 Minutes Intravenous  Once 12/19/20 1132 12/19/20 1350   12/19/20 1145  ceFEPIme (MAXIPIME) 2 g in sodium chloride 0.9 % 100 mL IVPB        2 g 200 mL/hr over 30 Minutes Intravenous  Once 12/19/20 1132 12/19/20 1223   12/19/20 1145  metroNIDAZOLE (FLAGYL) IVPB 500 mg  Status:  Discontinued        500 mg 100 mL/hr over 60 Minutes Intravenous Every 12 hours 12/19/20 1132 12/19/20 2144      Subjective: Today, patient was seen and examined at bedside.  Patient complains of mild pain on the foot.  Denies any chest pain, shortness of breath, fever or chills.   Objective: Vitals:   12/19/20 2132 12/20/20 0456  BP: 107/69 116/75  Pulse: 85 75  Resp:  18 18  Temp: 98.5 F (36.9 C) 98.5 F (36.9 C)  SpO2: 98% 98%    Intake/Output Summary (Last 24 hours) at 12/20/2020 0755 Last data filed at 12/20/2020 0456 Gross per 24 hour  Intake 521.67 ml  Output 150 ml  Net 371.67 ml   Filed Weights   12/19/20 1809 12/19/20 2132  Weight: (S) 71.2 kg 71.2 kg   Body mass index is 23.87 kg/m.   Physical Exam:  GENERAL: Patient is alert awake and oriented. Not in obvious distress. HENT: No scleral pallor or icterus. Pupils equally reactive to light. Oral mucosa  is moist NECK: is supple, no gross swelling noted. CHEST: Clear to auscultation. No crackles or wheezes.  Diminished breath sounds bilaterally. CVS: S1 and S2 heard, no murmur. Regular rate and rhythm.  ABDOMEN: Soft, non-tender, bowel sounds are present. EXTREMITIES: No edema. CNS: Cranial nerves are intact. No focal motor deficits. SKIN: Discolored ischemic appearing right fourth digit, interdigit nonhealing ulcer, spreading erythema of the dorsal foot.     Data Review: I have personally reviewed the following labs and imaging studies.  CBC: Recent Labs  Lab 12/19/20 1128 12/20/20 0253  WBC 10.2 7.0  NEUTROABS 7.5  --   HGB 13.4 11.6*  HCT 39.4 34.3*  MCV 94.3 95.3  PLT 194 702   Basic Metabolic Panel: Recent Labs  Lab 12/19/20 1128 12/20/20 0253  NA 137 133*  K 3.7 3.5  CL 98 101  CO2 31 25  GLUCOSE 99 112*  BUN 30* 25*  CREATININE 1.21 1.27*  CALCIUM 9.7 8.2*   Liver Function Tests: Recent Labs  Lab 12/19/20 1128  AST 16  ALT 15  ALKPHOS 68  BILITOT 1.3*  PROT 7.0  ALBUMIN 4.1   No results for input(s): LIPASE, AMYLASE in the last 168 hours. No results for input(s): AMMONIA in the last 168 hours. Cardiac Enzymes: No results for input(s): CKTOTAL, CKMB, CKMBINDEX, TROPONINI in the last 168 hours. BNP (last 3 results) No results for input(s): BNP in the last 8760 hours.  ProBNP (last 3 results) No results for input(s): PROBNP in the last 8760 hours.  CBG: No results for input(s): GLUCAP in the last 168 hours. Recent Results (from the past 240 hour(s))  Blood culture (routine x 2)     Status: None (Preliminary result)   Collection Time: 12/19/20 10:58 AM   Specimen: BLOOD  Result Value Ref Range Status   Specimen Description BLOOD RIGHT ANTECUBITAL  Final   Special Requests   Final    BOTTLES DRAWN AEROBIC AND ANAEROBIC Blood Culture adequate volume Performed at [facility], [address], [place], [place] [address]    Culture PENDING   Incomplete   Report Status PENDING  Incomplete  Blood culture (routine x 2)     Status: None (Preliminary result)   Collection Time: 12/19/20 11:03 AM   Specimen: BLOOD  Result Value Ref Range Status   Specimen Description BLOOD LEFT ANTECUBITAL  Final   Special Requests   Final    BOTTLES DRAWN AEROBIC AND ANAEROBIC Blood Culture adequate volume Performed at [facility], [place] [address], [place], [place] [address]    Culture PENDING  Incomplete   Report Status PENDING  Incomplete  Resp Panel by RT-PCR (Flu A&B, Covid) Nasopharyngeal Swab     Status: None   Collection Time: 12/19/20 12:21 PM   Specimen: Nasopharyngeal Swab; Nasopharyngeal(NP) swabs in vial transport medium  Result Value Ref Range Status  SARS Coronavirus 2 by RT PCR NEGATIVE NEGATIVE Final    Comment: (NOTE) SARS-CoV-2 target nucleic acids are NOT DETECTED.  The SARS-CoV-2 RNA is generally detectable in upper respiratory specimens during the acute phase of infection. The lowest concentration of SARS-CoV-2 viral copies this assay can detect is 138 copies/mL. A negative result does not preclude SARS-Cov-2 infection and should not be used as the sole basis for treatment or other patient management decisions. A negative result may occur with  improper specimen collection/handling, submission of specimen other than nasopharyngeal swab, presence of viral mutation(s) within the areas targeted by this assay, and inadequate number of viral copies(<138 copies/mL). A negative result must be combined with clinical observations, patient history, and epidemiological information. The expected result is Negative.  Fact Sheet for Patients:  EntrepreneurPulse.com.au  Fact Sheet for Healthcare Providers:  IncredibleEmployment.be  This test is no t yet approved or cleared by the [place] FDA and  has been authorized for detection and/or diagnosis of SARS-CoV-2 by FDA under an  Emergency Use Authorization (EUA). This EUA will remain  in effect (meaning this test can be used) for the duration of the COVID-19 declaration under Section 564(b)(1) of the Act, 21 U.S.C.section 360bbb-3(b)(1), unless the authorization is terminated  or revoked sooner.       Influenza A by PCR NEGATIVE NEGATIVE Final   Influenza B by PCR NEGATIVE NEGATIVE Final    Comment: (NOTE) The Xpert Xpress SARS-CoV-2/FLU/RSV plus assay is intended as an aid in the diagnosis of influenza from Nasopharyngeal swab specimens and should not be used as a sole basis for treatment. Nasal washings and aspirates are unacceptable for Xpert Xpress SARS-CoV-2/FLU/RSV testing.  Fact Sheet for Patients: EntrepreneurPulse.com.au  Fact Sheet for Healthcare Providers: IncredibleEmployment.be  This test is not yet approved or cleared by the [place] FDA and has been authorized for detection and/or diagnosis of SARS-CoV-2 by FDA under an Emergency Use Authorization (EUA). This EUA will remain in effect (meaning this test can be used) for the duration of the COVID-19 declaration under Section 564(b)(1) of the Act, 21 U.S.C. section 360bbb-3(b)(1), unless the authorization is terminated or revoked.  Performed at [facility], [address], [place], [place] [address]      Studies: DG Foot Complete Right  Result Date: 12/19/2020 CLINICAL DATA:  Soreness in the fourth and fifth toes. EXAM: RIGHT FOOT COMPLETE - 3+ VIEW COMPARISON:  None. FINDINGS: There is no evidence of fracture or dislocation. There is no evidence of arthropathy or other focal bone abnormality. Soft tissues are unremarkable. IMPRESSION: Negative. Electronically Signed   By: Misty Stanley M.D.   On: 12/19/2020 11:21      Flora Lipps, MD  [facility] 12/20/2020  If 7PM-7AM, please contact night-coverage

## 2020-12-20 NOTE — Progress Notes (Addendum)
Pt wheeled out of room for procedure.

## 2020-12-20 NOTE — Op Note (Signed)
    Patient name: Lance Dillon MRN: [id number] DOB: Nov 10, 1940 Sex: male  12/20/2020 Pre-operative Diagnosis: Critical right lower extremity ischemia with fourth toe ulceration Post-operative diagnosis:  Same Surgeon:  Erlene Quan C. Donzetta Matters, MD Procedure Performed: 1.  Ultrasound-guided cannulation left common femoral artery 2.  Aortogram 3.  Selection of right external leg artery and right lower extremity angiography 4.  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  He now has a fourth toe ulceration with depressed ABIs and has previous duplex demonstrating occluded bypass graft.  He is now indicated for angiography with possible intervention.  Findings: Left common femoral artery appeared to be occluded I was able to cannulate this just at the top prior to a collateral and get access.  Performed aortogram which demonstrated heavily calcified aorta and bilateral common and external iliac arteries.  I then crossed over to the common iliac artery on the right where I had collaterals to fill the right lower extremity.  I performed angiography was demonstrated occluded right common femoral with long segment occlusion of his SFA which was heavily calcified reconstitutes at the level of the knee where it is diseased below the knee is much less diseased appears to have runoff via the anterior tibial and peroneal arteries.  Plan will be for right common femoral endarterectomy and femoral to below-knee popliteal artery bypass which will likely need to be performed with graft given the vein has been harvested in the past for bypass.   Procedure:  The patient was identified in the holding area and taken to room 8.  The patient was then placed supine on the table and prepped and draped in the usual sterile fashion.  A time out was called.  Ultrasound  was used to evaluate the left common femoral artery.  This was heavily calcified.  I did find an area cephalad that was apparently patent.  I anesthetized the area and cannulated with micropuncture needle followed by wire and sheath.  I placed a Bentson wire followed by a 5 [place] sheath.  I placed an Omni catheter to the level of L1 and performed aortogram.  I then crossed the bifurcation with an Omni catheter and Bentson wire placed the catheter into the external neck artery on the right and perform right lower extremity angiography with the above findings.  I elected for no intervention.  The catheter was removed over wire.  Sheath will be pulled in postoperative holding.  He tolerated procedure without any complication.  Contrast: 80 cc   Ikey Omary C. Donzetta Matters, MD [facility] [place] Office: [phone] Pager: [phone]

## 2020-12-20 NOTE — H&P (View-Only) (Signed)
[facility] [place] PROGRESS NOTE  ASSESSMENT / PLAN: Lance Dillon is a 80 y.o. male with atherosclerosis of native arteries of right lower extremity causing 4/5th interdigital ulceration.  Plan right lower extremity angiogram with possible intervention in cath lab 12/20/20.   SUBJECTIVE: No interval changes. Reviewed plan for today.  OBJECTIVE: BP 102/66   Pulse 79   Temp 98.8 F (37.1 C) (Oral)   Resp 18   Ht 5\' 8"  (1.727 m)   Wt 71.2 kg   SpO2 98%   BMI 23.87 kg/m   Intake/Output Summary (Last 24 hours) at 12/20/2020 1137 Last data filed at 12/20/2020 1034 Gross per 24 hour  Intake 919.49 ml  Output 150 ml  Net 769.49 ml    No distress Regular rate and rhythm Unlabored breathing Unchanged appearance of right 4/5 toe webspace  CBC Latest Ref Rng & Units 12/20/2020 12/19/2020 07/28/2015  WBC 4.0 - 10.5 K/uL 7.0 10.2 5.4  Hemoglobin 13.0 - 17.0 g/dL 11.6(L) 13.4 11.0(L)  Hematocrit 39.0 - 52.0 % 34.3(L) 39.4 31.9(L)  Platelets 150 - 400 K/uL 153 194 152     CMP Latest Ref Rng & Units 12/20/2020 12/19/2020 12/24/2018  Glucose 70 - 99 mg/dL 112(H) 99 -  BUN 8 - 23 mg/dL 25(H) 30(H) -  Creatinine 0.61 - 1.24 mg/dL 1.27(H) 1.21 -  Sodium 135 - 145 mmol/L 133(L) 137 -  Potassium 3.5 - 5.1 mmol/L 3.5 3.7 -  Chloride 98 - 111 mmol/L 101 98 -  CO2 22 - 32 mmol/L 25 31 -  Calcium 8.9 - 10.3 mg/dL 8.2(L) 9.7 -  Total Protein 6.5 - 8.1 g/dL - 7.0 7.0  Total Bilirubin 0.3 - 1.2 mg/dL - 1.3(H) 1.0  Alkaline Phos 38 - 126 U/L - 68 60  AST 15 - 41 U/L - 16 38  ALT 0 - 44 U/L - 15 37    Estimated Creatinine Clearance: 44.9 mL/min (A) (by C-G formula based on SCr of 1.27 mg/dL (H)).  Lance Dillon. Stanford Breed, MD [facility] Phone Number: [phone] 12/20/2020 11:37 AM

## 2020-12-20 NOTE — Interval H&P Note (Signed)
History and Physical Interval Note:  12/20/2020 2:00 PM  Lance Dillon  has presented today for surgery, with the diagnosis of claudication.  The various methods of treatment have been discussed with the patient and family. After consideration of risks, benefits and other options for treatment, the patient has consented to  Procedure(s): ABDOMINAL AORTOGRAM W/LOWER EXTREMITY (N/A) as a surgical intervention.  The patient's history has been reviewed, patient examined, no change in status, stable for surgery.  I have reviewed the patient's chart and labs.  Questions were answered to the patient's satisfaction.     [name]

## 2020-12-20 NOTE — Progress Notes (Signed)
Site area: LT GROIN Site Prior to Removal:  Level 0 Pressure Applied For:20 MINUTES Manual:   YES Patient Status During Pull:  AWAKE Post Pull Site:  Level 0 Post Pull Instructions Given:  YES Post Pull Pulses Present: BIL DP DOPPLER Dressing Applied:  YES Bedrest begins @ 15:15 Comments:

## 2020-12-21 ENCOUNTER — Inpatient Hospital Stay (HOSPITAL_COMMUNITY): Payer: Medicare HMO | Admitting: Anesthesiology

## 2020-12-21 ENCOUNTER — Encounter (HOSPITAL_COMMUNITY)
Admission: EM | Disposition: A | Discharge status: Home Health | Payer: Self-pay | Source: Home / Self Care | Attending: Internal Medicine

## 2020-12-21 ENCOUNTER — Encounter (HOSPITAL_COMMUNITY): Payer: Self-pay | Admitting: Vascular Surgery

## 2020-12-21 DIAGNOSIS — I70235 Atherosclerosis of native arteries of right leg with ulceration of other part of foot: Secondary | ICD-10-CM

## 2020-12-21 HISTORY — PX: FEMORAL-POPLITEAL BYPASS GRAFT: SHX937

## 2020-12-21 HISTORY — PX: ENDARTERECTOMY FEMORAL: SHX5804

## 2020-12-21 LAB — POCT I-STAT 7, (LYTES, BLD GAS, ICA,H+H)
Acid-base deficit: 2 mmol/L (ref 0.0–2.0)
Bicarbonate: 25 mmol/L (ref 20.0–28.0)
Calcium, Ion: 1.16 mmol/L (ref 1.15–1.40)
HCT: 25 % — ABNORMAL LOW (ref 39.0–52.0)
Hemoglobin: 8.5 g/dL — ABNORMAL LOW (ref 13.0–17.0)
O2 Saturation: 100 %
Potassium: 4.2 mmol/L (ref 3.5–5.1)
Sodium: 136 mmol/L (ref 135–145)
TCO2: 27 mmol/L (ref 22–32)
pCO2 arterial: 54.5 mmHg — ABNORMAL HIGH (ref 32.0–48.0)
pH, Arterial: 7.27 — ABNORMAL LOW (ref 7.350–7.450)
pO2, Arterial: 440 mmHg — ABNORMAL HIGH (ref 83.0–108.0)

## 2020-12-21 LAB — CBC
HCT: 32.7 % — ABNORMAL LOW (ref 39.0–52.0)
Hemoglobin: 11 g/dL — ABNORMAL LOW (ref 13.0–17.0)
MCH: 32.1 pg (ref 26.0–34.0)
MCHC: 33.6 g/dL (ref 30.0–36.0)
MCV: 95.3 fL (ref 80.0–100.0)
Platelets: 154 10*3/uL (ref 150–400)
RBC: 3.43 MIL/uL — ABNORMAL LOW (ref 4.22–5.81)
RDW: 12.5 % (ref 11.5–15.5)
WBC: 7 10*3/uL (ref 4.0–10.5)
nRBC: 0 % (ref 0.0–0.2)

## 2020-12-21 LAB — BASIC METABOLIC PANEL
Anion gap: 7 (ref 5–15)
BUN: 18 mg/dL (ref 8–23)
CO2: 24 mmol/L (ref 22–32)
Calcium: 8.3 mg/dL — ABNORMAL LOW (ref 8.9–10.3)
Chloride: 102 mmol/L (ref 98–111)
Creatinine, Ser: 1.16 mg/dL (ref 0.61–1.24)
GFR, Estimated: >60 mL/min (ref >60)
Glucose, Bld: 113 mg/dL — ABNORMAL HIGH (ref 70–99)
Potassium: 4.1 mmol/L (ref 3.5–5.1)
Sodium: 133 mmol/L — ABNORMAL LOW (ref 135–145)

## 2020-12-21 LAB — POCT ACTIVATED CLOTTING TIME
Activated Clotting Time: 208 seconds
Activated Clotting Time: 283 seconds

## 2020-12-21 LAB — SURGICAL PCR SCREEN
MRSA, PCR: NEGATIVE
Staphylococcus aureus: POSITIVE — AB

## 2020-12-21 LAB — PREPARE RBC (CROSSMATCH)

## 2020-12-21 SURGERY — BYPASS GRAFT FEMORAL-POPLITEAL ARTERY
Anesthesia: General | Laterality: Right

## 2020-12-21 MED ORDER — SUGAMMADEX SODIUM 200 MG/2ML IV SOLN
INTRAVENOUS | Status: DC | PRN
  Administered 2020-12-21: 400 mg via INTRAVENOUS

## 2020-12-21 MED ORDER — SODIUM CHLORIDE 0.9 % IV SOLN: 500.0000 mL | Freq: Once | INTRAVENOUS | Status: DC | PRN

## 2020-12-21 MED ORDER — CHLORHEXIDINE GLUCONATE 0.12 % MT SOLN
15.0000 mL | Freq: Once | OROMUCOSAL | Status: AC
  Administered 2020-12-21: 15 mL via OROMUCOSAL
  Filled 2020-12-21: qty 15

## 2020-12-21 MED ORDER — AMISULPRIDE (ANTIEMETIC) 5 MG/2ML IV SOLN: 10.0000 mg | Freq: Once | INTRAVENOUS | Status: DC | PRN

## 2020-12-21 MED ORDER — ALBUMIN HUMAN 5 % IV SOLN: INTRAVENOUS | Status: DC | PRN

## 2020-12-21 MED ORDER — PROTAMINE SULFATE 10 MG/ML IV SOLN
INTRAVENOUS | Status: AC
  Filled 2020-12-21: qty 5

## 2020-12-21 MED ORDER — LIDOCAINE 2% (20 MG/ML) 5 ML SYRINGE
INTRAMUSCULAR | Status: DC | PRN
  Administered 2020-12-21: 80 mg via INTRAVENOUS
  Administered 2020-12-21: 20 mg via INTRAVENOUS

## 2020-12-21 MED ORDER — FENTANYL CITRATE (PF) 100 MCG/2ML IJ SOLN: 25.0000 ug | INTRAMUSCULAR | Status: DC | PRN

## 2020-12-21 MED ORDER — DOCUSATE SODIUM 100 MG PO CAPS
100.0000 mg | ORAL_CAPSULE | Freq: Every day | ORAL | Status: DC
  Administered 2020-12-22 – 2020-12-25 (×3): 100 mg via ORAL
  Filled 2020-12-21 (×4): qty 1

## 2020-12-21 MED ORDER — LACTATED RINGERS IV SOLN: INTRAVENOUS | Status: DC

## 2020-12-21 MED ORDER — BISACODYL 10 MG RE SUPP: 10.0000 mg | Freq: Every day | RECTAL | Status: DC | PRN

## 2020-12-21 MED ORDER — FENTANYL CITRATE (PF) 250 MCG/5ML IJ SOLN
INTRAMUSCULAR | Status: DC | PRN
  Administered 2020-12-21: 50 ug via INTRAVENOUS
  Administered 2020-12-21: 25 ug via INTRAVENOUS
  Administered 2020-12-21 (×2): 50 ug via INTRAVENOUS

## 2020-12-21 MED ORDER — ONDANSETRON HCL 4 MG/2ML IJ SOLN
INTRAMUSCULAR | Status: AC
  Filled 2020-12-21: qty 2

## 2020-12-21 MED ORDER — MAGNESIUM SULFATE 2 GM/50ML IV SOLN: 2.0000 g | Freq: Every day | INTRAVENOUS | Status: DC | PRN

## 2020-12-21 MED ORDER — SODIUM CHLORIDE 0.9 % IV SOLN: INTRAVENOUS | Status: DC

## 2020-12-21 MED ORDER — PHENYLEPHRINE 40 MCG/ML (10ML) SYRINGE FOR IV PUSH (FOR BLOOD PRESSURE SUPPORT)
PREFILLED_SYRINGE | INTRAVENOUS | Status: DC | PRN
  Administered 2020-12-21: 240 ug via INTRAVENOUS
  Administered 2020-12-21 (×2): 80 ug via INTRAVENOUS

## 2020-12-21 MED ORDER — OXYCODONE HCL 5 MG/5ML PO SOLN: 5.0000 mg | Freq: Once | ORAL | Status: DC | PRN

## 2020-12-21 MED ORDER — MORPHINE SULFATE (PF) 2 MG/ML IV SOLN
2.0000 mg | INTRAVENOUS | Status: DC | PRN
  Administered 2020-12-21 – 2020-12-23 (×3): 2 mg via INTRAVENOUS
  Filled 2020-12-21 (×3): qty 1

## 2020-12-21 MED ORDER — PHENYLEPHRINE 40 MCG/ML (10ML) SYRINGE FOR IV PUSH (FOR BLOOD PRESSURE SUPPORT)
PREFILLED_SYRINGE | INTRAVENOUS | Status: AC
  Filled 2020-12-21: qty 10

## 2020-12-21 MED ORDER — POLYETHYLENE GLYCOL 3350 17 G PO PACK
17.0000 g | PACK | Freq: Every day | ORAL | Status: DC | PRN
  Administered 2020-12-22: 17 g via ORAL
  Filled 2020-12-21: qty 1

## 2020-12-21 MED ORDER — PHENYLEPHRINE HCL-NACL 20-0.9 MG/250ML-% IV SOLN
INTRAVENOUS | Status: DC | PRN
  Administered 2020-12-21: 40 ug/min via INTRAVENOUS

## 2020-12-21 MED ORDER — HYDRALAZINE HCL 20 MG/ML IJ SOLN: 5.0000 mg | INTRAMUSCULAR | Status: DC | PRN

## 2020-12-21 MED ORDER — LABETALOL HCL 5 MG/ML IV SOLN: 10.0000 mg | INTRAVENOUS | Status: DC | PRN

## 2020-12-21 MED ORDER — PROPOFOL 10 MG/ML IV BOLUS
INTRAVENOUS | Status: DC | PRN
  Administered 2020-12-21: 170 mg via INTRAVENOUS

## 2020-12-21 MED ORDER — GUAIFENESIN-DM 100-10 MG/5ML PO SYRP: 15.0000 mL | ORAL_SOLUTION | ORAL | Status: DC | PRN

## 2020-12-21 MED ORDER — FENTANYL CITRATE (PF) 100 MCG/2ML IJ SOLN
INTRAMUSCULAR | Status: AC
  Administered 2020-12-21: 25 ug via INTRAVENOUS
  Filled 2020-12-21: qty 2

## 2020-12-21 MED ORDER — ORAL CARE MOUTH RINSE: 15.0000 mL | Freq: Once | OROMUCOSAL | Status: AC

## 2020-12-21 MED ORDER — PANTOPRAZOLE SODIUM 40 MG PO TBEC
40.0000 mg | DELAYED_RELEASE_TABLET | Freq: Every day | ORAL | Status: DC
  Administered 2020-12-22 – 2020-12-25 (×4): 40 mg via ORAL
  Filled 2020-12-21 (×5): qty 1

## 2020-12-21 MED ORDER — DEXAMETHASONE SODIUM PHOSPHATE 10 MG/ML IJ SOLN
INTRAMUSCULAR | Status: AC
  Filled 2020-12-21: qty 1

## 2020-12-21 MED ORDER — ROCURONIUM BROMIDE 10 MG/ML (PF) SYRINGE
PREFILLED_SYRINGE | INTRAVENOUS | Status: DC | PRN
  Administered 2020-12-21: 20 mg via INTRAVENOUS
  Administered 2020-12-21: 80 mg via INTRAVENOUS
  Administered 2020-12-21: 20 mg via INTRAVENOUS

## 2020-12-21 MED ORDER — ALUM & MAG HYDROXIDE-SIMETH 200-200-20 MG/5ML PO SUSP: 15.0000 mL | ORAL | Status: DC | PRN

## 2020-12-21 MED ORDER — FENTANYL CITRATE (PF) 100 MCG/2ML IJ SOLN: 25.0000 ug | Freq: Once | INTRAMUSCULAR | Status: AC

## 2020-12-21 MED ORDER — ONDANSETRON HCL 4 MG/2ML IJ SOLN
INTRAMUSCULAR | Status: DC | PRN
  Administered 2020-12-21: 4 mg via INTRAVENOUS

## 2020-12-21 MED ORDER — PROTAMINE SULFATE 10 MG/ML IV SOLN
INTRAVENOUS | Status: DC | PRN
Start: 2020-12-21 — End: 2020-12-21
  Administered 2020-12-21 (×2): 25 mg via INTRAVENOUS

## 2020-12-21 MED ORDER — METOPROLOL TARTRATE 5 MG/5ML IV SOLN: 2.0000 mg | INTRAVENOUS | Status: DC | PRN

## 2020-12-21 MED ORDER — POTASSIUM CHLORIDE CRYS ER 20 MEQ PO TBCR: 20.0000 meq | EXTENDED_RELEASE_TABLET | Freq: Every day | ORAL | Status: DC | PRN

## 2020-12-21 MED ORDER — OXYCODONE HCL 5 MG PO TABS: 5.0000 mg | ORAL_TABLET | Freq: Once | ORAL | Status: DC | PRN

## 2020-12-21 MED ORDER — LACTATED RINGERS IV SOLN: INTRAVENOUS | Status: DC | PRN

## 2020-12-21 MED ORDER — LIDOCAINE 2% (20 MG/ML) 5 ML SYRINGE
INTRAMUSCULAR | Status: AC
  Filled 2020-12-21: qty 5

## 2020-12-21 MED ORDER — SODIUM CHLORIDE 0.9 % IV SOLN: 10.0000 mL/h | Freq: Once | INTRAVENOUS | Status: DC

## 2020-12-21 MED ORDER — HEPARIN SODIUM (PORCINE) 5000 UNIT/ML IJ SOLN
5000.0000 [IU] | Freq: Three times a day (TID) | INTRAMUSCULAR | Status: DC
  Administered 2020-12-22 – 2020-12-25 (×10): 5000 [IU] via SUBCUTANEOUS
  Filled 2020-12-21 (×10): qty 1

## 2020-12-21 MED ORDER — 0.9 % SODIUM CHLORIDE (POUR BTL) OPTIME
TOPICAL | Status: DC | PRN
  Administered 2020-12-21: 2000 mL

## 2020-12-21 MED ORDER — LIDOCAINE HCL URETHRAL/MUCOSAL 2 % EX GEL
CUTANEOUS | Status: DC | PRN
  Administered 2020-12-21: 1 via URETHRAL

## 2020-12-21 MED ORDER — HEMOSTATIC AGENTS (NO CHARGE) OPTIME
TOPICAL | Status: DC | PRN
  Administered 2020-12-21: 1 via TOPICAL

## 2020-12-21 MED ORDER — ONDANSETRON HCL 4 MG/2ML IJ SOLN: 4.0000 mg | Freq: Once | INTRAMUSCULAR | Status: DC | PRN

## 2020-12-21 MED ORDER — ROCURONIUM BROMIDE 10 MG/ML (PF) SYRINGE
PREFILLED_SYRINGE | INTRAVENOUS | Status: AC
  Filled 2020-12-21: qty 10

## 2020-12-21 MED ORDER — ONDANSETRON HCL 4 MG/2ML IJ SOLN: 4.0000 mg | Freq: Four times a day (QID) | INTRAMUSCULAR | Status: DC | PRN

## 2020-12-21 MED ORDER — PHENOL 1.4 % MT LIQD: 1.0000 | OROMUCOSAL | Status: DC | PRN

## 2020-12-21 MED ORDER — DEXAMETHASONE SODIUM PHOSPHATE 10 MG/ML IJ SOLN
INTRAMUSCULAR | Status: DC | PRN
  Administered 2020-12-21 (×2): 5 mg via INTRAVENOUS

## 2020-12-21 MED ORDER — HEPARIN 6000 UNIT IRRIGATION SOLUTION
Status: DC | PRN
  Administered 2020-12-21: 1

## 2020-12-21 MED ORDER — FENTANYL CITRATE (PF) 250 MCG/5ML IJ SOLN
INTRAMUSCULAR | Status: AC
  Filled 2020-12-21: qty 5

## 2020-12-21 MED ORDER — LIDOCAINE HCL URETHRAL/MUCOSAL 2 % EX GEL
CUTANEOUS | Status: AC
  Filled 2020-12-21: qty 11

## 2020-12-21 MED ORDER — HEPARIN SODIUM (PORCINE) 1000 UNIT/ML IJ SOLN
INTRAMUSCULAR | Status: DC | PRN
  Administered 2020-12-21: 5000 [IU] via INTRAVENOUS
  Administered 2020-12-21: 7000 [IU] via INTRAVENOUS

## 2020-12-21 SURGICAL SUPPLY — 66 items
ADH SKN CLS APL DERMABOND .7 (GAUZE/BANDAGES/DRESSINGS) ×2
BAG COUNTER SPONGE SURGICOUNT (BAG) ×2 IMPLANT
BAG SPNG CNTER NS LX DISP (BAG) ×1
BANDAGE ESMARK 6X9 LF (GAUZE/BANDAGES/DRESSINGS) ×1 IMPLANT
BNDG CMPR 9X6 STRL LF SNTH (GAUZE/BANDAGES/DRESSINGS) ×1
BNDG ESMARK 6X9 LF (GAUZE/BANDAGES/DRESSINGS) ×2
CANISTER SUCT 3000ML PPV (MISCELLANEOUS) ×2 IMPLANT
CANNULA VESSEL 3MM 2 BLNT TIP (CANNULA) IMPLANT
CATH COUDE FOLEY 2W 5CC 16FR (CATHETERS) ×2 IMPLANT
CATH COUDE FOLEY 5CC 14FR (CATHETERS) ×2 IMPLANT
CATH FOLEY 2WAY SLVR  5CC 14FR (CATHETERS) ×2
CATH FOLEY 2WAY SLVR 5CC 14FR (CATHETERS) ×1 IMPLANT
CLIP LIGATING EXTRA MED SLVR (CLIP) ×2 IMPLANT
CLIP LIGATING EXTRA SM BLUE (MISCELLANEOUS) ×2 IMPLANT
CLIP VESOCCLUDE SM WIDE 24/CT (CLIP) ×2 IMPLANT
COVER PROBE W GEL 5X96 (DRAPES) ×2 IMPLANT
CUFF TOURN SGL QUICK 24 (TOURNIQUET CUFF) ×2
CUFF TRNQT CYL 24X4X40X1 (TOURNIQUET CUFF) ×1 IMPLANT
DERMABOND ADVANCED (GAUZE/BANDAGES/DRESSINGS) ×2
DERMABOND ADVANCED .7 DNX12 (GAUZE/BANDAGES/DRESSINGS) ×2 IMPLANT
DRAIN CHANNEL 15F RND FF W/TCR (WOUND CARE) IMPLANT
DRAPE C-ARM 42X72 X-RAY (DRAPES) IMPLANT
DRAPE HALF SHEET 40X57 (DRAPES) IMPLANT
DRAPE X-RAY CASS 24X20 (DRAPES) IMPLANT
ELECT REM PT RETURN 9FT ADLT (ELECTROSURGICAL) ×4
ELECTRODE REM PT RTRN 9FT ADLT (ELECTROSURGICAL) ×2 IMPLANT
EVACUATOR SILICONE 100CC (DRAIN) IMPLANT
GAUZE 4X4 16PLY ~~LOC~~+RFID DBL (SPONGE) ×2 IMPLANT
GLOVE SURG ENC MOIS LTX SZ7.5 (GLOVE) ×2 IMPLANT
GLOVE SURG UNDER POLY LF SZ7 (GLOVE) ×2 IMPLANT
GOWN STRL REUS W/ TWL LRG LVL3 (GOWN DISPOSABLE) ×3 IMPLANT
GOWN STRL REUS W/ TWL XL LVL3 (GOWN DISPOSABLE) ×1 IMPLANT
GOWN STRL REUS W/TWL LRG LVL3 (GOWN DISPOSABLE) ×6
GOWN STRL REUS W/TWL XL LVL3 (GOWN DISPOSABLE) ×2
GRAFT PROPATEN W/RING 6X80X60 (Vascular Products) ×2 IMPLANT
HEMOSTAT SNOW SURGICEL 2X4 (HEMOSTASIS) IMPLANT
INSERT FOGARTY SM (MISCELLANEOUS) IMPLANT
KIT BASIN OR (CUSTOM PROCEDURE TRAY) ×2 IMPLANT
KIT TURNOVER KIT B (KITS) ×2 IMPLANT
NS IRRIG 1000ML POUR BTL (IV SOLUTION) ×4 IMPLANT
PACK PERIPHERAL VASCULAR (CUSTOM PROCEDURE TRAY) ×2 IMPLANT
PAD ARMBOARD 7.5X6 YLW CONV (MISCELLANEOUS) ×4 IMPLANT
PADDING CAST COTTON 6X4 STRL (CAST SUPPLIES) ×2 IMPLANT
PENCIL BUTTON HOLSTER BLD 10FT (ELECTRODE) ×2 IMPLANT
SPONGE INTESTINAL PEANUT (DISPOSABLE) ×2 IMPLANT
SPONGE T-LAP 18X18 ~~LOC~~+RFID (SPONGE) ×6 IMPLANT
STOPCOCK 4 WAY LG BORE MALE ST (IV SETS) IMPLANT
SUT ETHILON 3 0 PS 1 (SUTURE) IMPLANT
SUT MNCRL AB 4-0 PS2 18 (SUTURE) ×4 IMPLANT
SUT PROLENE 4 0 RB 1 (SUTURE) ×6
SUT PROLENE 4-0 RB1 .5 CRCL 36 (SUTURE) ×3 IMPLANT
SUT PROLENE 5 0 C 1 24 (SUTURE) ×4 IMPLANT
SUT PROLENE 6 0 BV (SUTURE) ×8 IMPLANT
SUT SILK 2 0 SH (SUTURE) ×2 IMPLANT
SUT SILK 3 0 (SUTURE)
SUT SILK 3-0 18XBRD TIE 12 (SUTURE) IMPLANT
SUT VIC AB 2-0 CT1 27 (SUTURE) ×4
SUT VIC AB 2-0 CT1 TAPERPNT 27 (SUTURE) ×2 IMPLANT
SUT VIC AB 3-0 SH 27 (SUTURE) ×4
SUT VIC AB 3-0 SH 27X BRD (SUTURE) ×2 IMPLANT
TOWEL GREEN STERILE (TOWEL DISPOSABLE) ×4 IMPLANT
TOWEL GREEN STERILE FF (TOWEL DISPOSABLE) ×2 IMPLANT
TRAY CATH INTERMITTENT SS 16FR (CATHETERS) ×2 IMPLANT
TRAY FOLEY MTR SLVR 16FR STAT (SET/KITS/TRAYS/PACK) IMPLANT
UNDERPAD 30X36 HEAVY ABSORB (UNDERPADS AND DIAPERS) ×2 IMPLANT
WATER STERILE IRR 1000ML POUR (IV SOLUTION) ×2 IMPLANT

## 2020-12-21 NOTE — Progress Notes (Signed)
PROGRESS NOTE  Lance Dillon NLG:[id number] DOB: 02/08/1940 DOA: 12/19/2020 PCP: Deland Pretty, MD   LOS: 2 days   Brief narrative:  Lance Dillon is a 80 y.o. male with past medical history of peripheral artery disease, renal artery stenosis, carotid stenosis, cerebral aneurysm, COPD, CKD stage II, history of prostate cancer status post prostatectomy, hypertension and hyperlipidemia presented to hospital with ulceration in his right foot for the last 2 weeks.  Patient had been following up with podiatry but had been having worsening pain in spreading erythema.  Patient does have history of right external iliac fever profunda femoris endarterectomy and right femoral to popliteal bypass in the past.  In the ED, patient was afebrile, normotensive.  No leukocytosis.  Foot x-ray was unremarkable.  Patient was seen by Dr. Stanford Breed, with vascular surgery who has plans for lower extremity angiogram with possible intervention on 12/20/2020,.  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   History of right femoral-popliteal bypass.   On  IV vancomycin and cefepime.  Vascular surgery on board and patient underwent angiogram and recommendation is bypass surgery.  Patient is currently n.p.o. for bypass surgery today   CKD stage II with previous history of left renal artery stenosis. Creatinine at 1.1.  We will continue to monitor closely..   Essential HTN  Blood pressure stable at this time.  Continue as needed hydralazine for now.   COPD Did not appear to be in exacerbation.  Continue inhalers.   History of cerebral aneurysm -Asymptomatic.  Unchanged on recent MRA head in August  DVT  prophylaxis:    On hold for surgery.  Code Status: Full code  Family Communication:  Spoke with the patient's wife on the phone and updated her about the clinical condition of the patient.  Status is: Inpatient  Remains inpatient appropriate because: Vascular surgery  surgery today, IV antibiotics  Consultants: Vascular surgery  Procedures: Angiogram on 12/20/20  Anti-infectives:  Vancomycin and  cefepime   Anti-infectives (From admission, onward)    Start     Dose/Rate Route Frequency Ordered Stop   12/20/20 0800  [MAR Hold]  vancomycin (VANCOREADY) IVPB 1250 mg/250 mL        (MAR Hold since Tue 12/21/2020 at 0910.Hold Reason: Transfer to a Procedural area)   1,250 mg 166.7 mL/hr over 90 Minutes Intravenous Every 24 hours 12/19/20 1413     12/20/20 0000  [MAR Hold]  ceFEPIme (MAXIPIME) 2 g in sodium chloride 0.9 % 100 mL IVPB        (MAR Hold since Tue 12/21/2020 at 0910.Hold Reason: Transfer to a Procedural area)   2 g 200 mL/hr over 30 Minutes Intravenous Every 12 hours 12/19/20 1413     12/19/20 1145  vancomycin (VANCOCIN) IVPB 1000 mg/200 mL premix        1,000 mg 200 mL/hr over 60 Minutes Intravenous  Once 12/19/20 1132 12/19/20 1350   12/19/20 1145  ceFEPIme (MAXIPIME) 2 g in sodium chloride 0.9 % 100 mL IVPB        2 g 200 mL/hr over 30 Minutes Intravenous  Once 12/19/20 1132 12/19/20 1223   12/19/20 1145  metroNIDAZOLE (FLAGYL) IVPB 500 mg  Status:  Discontinued  500 mg 100 mL/hr over 60 Minutes Intravenous Every 12 hours 12/19/20 1132 12/19/20 2144      Subjective: Today, patient was seen and examined at bedside.  N.p.o. for surgical intervention.   Objective: Vitals:   12/21/20 0352 12/21/20 0923  BP: (!) 114/59 140/68  Pulse: 80 81  Resp: 18 18  Temp: 99.8 F (37.7 C) 97.7 F (36.5 C)  SpO2: 99% 100%    Intake/Output Summary (Last 24 hours) at 12/21/2020 0954 Last data filed at 12/21/2020 0340 Gross per 24 hour  Intake 1941.63 ml   Output 1550 ml  Net 391.63 ml    Filed Weights   12/19/20 1809 12/19/20 2132 12/21/20 0352  Weight: (S) 71.2 kg 71.2 kg 73 kg   Body mass index is 24.47 kg/m.   Physical Exam:  GENERAL: Patient is alert awake oriented.   HENT: No scleral pallor or icterus. Pupils equally reactive to light. Oral mucosa is moist NECK: is supple, no gross swelling noted. CHEST: Clear to auscultation. No crackles or wheezes.  Diminished breath sounds bilaterally. CVS: S1 and S2 heard, no murmur. Regular rate and rhythm.  ABDOMEN: Soft, non-tender, bowel sounds are present. EXTREMITIES: No edema.  Right foot with ischemic digit. CNS: Cranial nerves are intact. No focal motor deficits. SKIN: Discolored ischemic appearing right fourth digit, interdigit nonhealing ulcer, spreading erythema of the dorsal foot.     Data Review: I have personally reviewed the following labs and imaging studies.  CBC: Recent Labs  Lab 12/19/20 1128 12/20/20 0253 12/21/20 0254  WBC 10.2 7.0 7.0  NEUTROABS 7.5  --   --   HGB 13.4 11.6* 11.0*  HCT 39.4 34.3* 32.7*  MCV 94.3 95.3 95.3  PLT 194 153 546    Basic Metabolic Panel: Recent Labs  Lab 12/19/20 1128 12/20/20 0253 12/21/20 0254  NA 137 133* 133*  K 3.7 3.5 4.1  CL 98 101 102  CO2 31 25 24   GLUCOSE 99 112* 113*  BUN 30* 25* 18  CREATININE 1.21 1.27* 1.16  CALCIUM 9.7 8.2* 8.3*    Liver Function Tests: Recent Labs  Lab 12/19/20 1128  AST 16  ALT 15  ALKPHOS 68  BILITOT 1.3*  PROT 7.0  ALBUMIN 4.1    No results for input(s): LIPASE, AMYLASE in the last 168 hours. No results for input(s): AMMONIA in the last 168 hours. Cardiac Enzymes: No results for input(s): CKTOTAL, CKMB, CKMBINDEX, TROPONINI in the last 168 hours. BNP (last 3 results) No results for input(s): BNP in the last 8760 hours.  ProBNP (last 3 results) No results for input(s): PROBNP in the last 8760 hours.  CBG: No results for input(s): GLUCAP in the last 168  hours. Recent Results (from the past 240 hour(s))  Blood culture (routine x 2)     Status: None (Preliminary result)   Collection Time: 12/19/20 10:58 AM   Specimen: BLOOD  Result Value Ref Range Status   Specimen Description BLOOD RIGHT ANTECUBITAL  Final   Special Requests   Final    BOTTLES DRAWN AEROBIC AND ANAEROBIC Blood Culture adequate volume   Culture   Final    NO GROWTH 2 DAYS Performed at [facility], [address] [place], [place] [address]    Report Status PENDING  Incomplete  Blood culture (routine x 2)     Status: None (Preliminary result)   Collection Time: 12/19/20 11:03 AM   Specimen: BLOOD  Result Value Ref Range Status   Specimen Description  BLOOD LEFT ANTECUBITAL  Final   Special Requests   Final    BOTTLES DRAWN AEROBIC AND ANAEROBIC Blood Culture adequate volume   Culture   Final    NO GROWTH 2 DAYS Performed at [facility], [address], [place], [place] [address]    Report Status PENDING  Incomplete  Resp Panel by RT-PCR (Flu A&B, Covid) Nasopharyngeal Swab     Status: None   Collection Time: 12/19/20 12:21 PM   Specimen: Nasopharyngeal Swab; Nasopharyngeal(NP) swabs in vial transport medium  Result Value Ref Range Status   SARS Coronavirus 2 by RT PCR NEGATIVE NEGATIVE Final    Comment: (NOTE) SARS-CoV-2 target nucleic acids are NOT DETECTED.  The SARS-CoV-2 RNA is generally detectable in upper respiratory specimens during the acute phase of infection. The lowest concentration of SARS-CoV-2 viral copies this assay can detect is 138 copies/mL. A negative result does not preclude SARS-Cov-2 infection and should not be used as the sole basis for treatment or other patient management decisions. A negative result may occur with  improper specimen collection/handling, submission of specimen other than nasopharyngeal swab, presence of viral mutation(s) within the areas targeted by this assay, and inadequate number of viral copies(<138  copies/mL). A negative result must be combined with clinical observations, patient history, and epidemiological information. The expected result is Negative.  Fact Sheet for Patients:  EntrepreneurPulse.com.au  Fact Sheet for Healthcare Providers:  IncredibleEmployment.be  This test is no t yet approved or cleared by the [place] FDA and  has been authorized for detection and/or diagnosis of SARS-CoV-2 by FDA under an Emergency Use Authorization (EUA). This EUA will remain  in effect (meaning this test can be used) for the duration of the COVID-19 declaration under Section 564(b)(1) of the Act, 21 U.S.C.section 360bbb-3(b)(1), unless the authorization is terminated  or revoked sooner.       Influenza A by PCR NEGATIVE NEGATIVE Final   Influenza B by PCR NEGATIVE NEGATIVE Final    Comment: (NOTE) The Xpert Xpress SARS-CoV-2/FLU/RSV plus assay is intended as an aid in the diagnosis of influenza from Nasopharyngeal swab specimens and should not be used as a sole basis for treatment. Nasal washings and aspirates are unacceptable for Xpert Xpress SARS-CoV-2/FLU/RSV testing.  Fact Sheet for Patients: EntrepreneurPulse.com.au  Fact Sheet for Healthcare Providers: IncredibleEmployment.be  This test is not yet approved or cleared by the [place] FDA and has been authorized for detection and/or diagnosis of SARS-CoV-2 by FDA under an Emergency Use Authorization (EUA). This EUA will remain in effect (meaning this test can be used) for the duration of the COVID-19 declaration under Section 564(b)(1) of the Act, 21 U.S.C. section 360bbb-3(b)(1), unless the authorization is terminated or revoked.  Performed at [facility], [address], [place], [place] [address]       Studies: PERIPHERAL VASCULAR CATHETERIZATION  Result Date: 12/20/2020 Patient name: KANIEL KIANG MRN:  [id number] DOB: September 08, 1940 Sex: male 12/20/2020 Pre-operative Diagnosis: Critical right lower extremity ischemia with fourth toe ulceration Post-operative diagnosis:  Same Surgeon:  Eda Paschal. Donzetta Matters, MD Procedure Performed: 1.  Ultrasound-guided cannulation left common femoral artery 2.  Aortogram 3.  Selection of right external leg artery and right lower extremity angiography 4.  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  He now has a fourth toe ulceration with depressed ABIs and  has previous duplex demonstrating occluded bypass graft.  He is now indicated for angiography with possible intervention. Findings: Left common femoral artery appeared to be occluded I was able to cannulate this just at the top prior to a collateral and get access.  Performed aortogram which demonstrated heavily calcified aorta and bilateral common and external iliac arteries.  I then crossed over to the common iliac artery on the right where I had collaterals to fill the right lower extremity.  I performed angiography was demonstrated occluded right common femoral with long segment occlusion of his SFA which was heavily calcified reconstitutes at the level of the knee where it is diseased below the knee is much less diseased appears to have runoff via the anterior tibial and peroneal arteries. Plan will be for right common femoral endarterectomy and femoral to below-knee popliteal artery bypass which will likely need to be performed with graft given the vein has been harvested in the past for bypass.  Procedure:  The patient was identified in the holding area and taken to room 8.  The patient was then placed supine on the table and prepped and draped in the usual sterile fashion.  A time out was called.  Ultrasound was used to evaluate the left common femoral artery.  This was  heavily calcified.  I did find an area cephalad that was apparently patent.  I anesthetized the area and cannulated with micropuncture needle followed by wire and sheath.  I placed a Bentson wire followed by a 5 [place] sheath.  I placed an Omni catheter to the level of L1 and performed aortogram.  I then crossed the bifurcation with an Omni catheter and Bentson wire placed the catheter into the external neck artery on the right and perform right lower extremity angiography with the above findings.  I elected for no intervention.  The catheter was removed over wire.  Sheath will be pulled in postoperative holding.  He tolerated procedure without any complication. Contrast: 80 cc Brandon C. Donzetta Matters, MD [facility] Office: [phone] Pager: [phone]   DG Foot Complete Right  Result Date: 12/19/2020 CLINICAL DATA:  Soreness in the fourth and fifth toes. EXAM: RIGHT FOOT COMPLETE - 3+ VIEW COMPARISON:  None. FINDINGS: There is no evidence of fracture or dislocation. There is no evidence of arthropathy or other focal bone abnormality. Soft tissues are unremarkable. IMPRESSION: Negative. Electronically Signed   By: Misty Stanley M.D.   On: 12/19/2020 11:21      Flora Lipps, MD  [facility] 12/21/2020  If 7PM-7AM, please contact night-coverage

## 2020-12-21 NOTE — Transfer of Care (Signed)
Immediate Anesthesia Transfer of Care Note  Patient: Lance Dillon  Procedure(s) Performed: RIGHT FEMORAL-POPLITEAL ARTERY BYPASS GRAFT WITH PTFE (Right) RIGHT FEMORAL ENDARTERECTOMY (Right)  Patient Location: PACU  Anesthesia Type:General  Level of Consciousness: drowsy  Airway & Oxygen Therapy: Patient Spontanous Breathing and Patient connected to face mask oxygen  Post-op Assessment: Report given to RN and Post -op Vital signs reviewed and stable  Post vital signs: Reviewed and stable  Last Vitals:  Vitals Value Taken Time  BP 108/50 12/21/20 1505  Temp    Pulse 98 12/21/20 1505  Resp 18 12/21/20 1505  SpO2 100 % 12/21/20 1505    Last Pain:  Vitals:   12/21/20 0934  TempSrc:   PainSc: 2       Patients Stated Pain Goal: 0 (09/47/09 6283)  Complications: No notable events documented.

## 2020-12-21 NOTE — Progress Notes (Addendum)
Pt arrived from PACU, VSS, CHG complete, Call light within reach, orders checked, oriented to unit. Groins level 0, doppler Pt in both [place], RN 12/21/2020 5:27 PM

## 2020-12-21 NOTE — Progress Notes (Signed)
  Progress Note    12/21/2020 7:53 AM 1 Day Post-Op  Subjective: No overnight issues  Vitals:   12/21/20 0013 12/21/20 0352  BP: 104/71 (!) 114/59  Pulse: 88 80  Resp: 16 18  Temp: 99.2 F (37.3 C) 99.8 F (37.7 C)  SpO2: 98% 99%    Physical Exam: Awake alert oriented None the respirations No hematoma left groin Right fourth toe ulceration stable  CBC    Component Value Date/Time   WBC 7.0 12/21/2020 0254   RBC 3.43 (L) 12/21/2020 0254   HGB 11.0 (L) 12/21/2020 0254   HCT 32.7 (L) 12/21/2020 0254   PLT 154 12/21/2020 0254   MCV 95.3 12/21/2020 0254   MCH 32.1 12/21/2020 0254   MCHC 33.6 12/21/2020 0254   RDW 12.5 12/21/2020 0254   LYMPHSABS 1.6 12/19/2020 1128   MONOABS 0.8 12/19/2020 1128   EOSABS 0.2 12/19/2020 1128   BASOSABS 0.1 12/19/2020 1128    BMET    Component Value Date/Time   NA 133 (L) 12/21/2020 0254   K 4.1 12/21/2020 0254   CL 102 12/21/2020 0254   CO2 24 12/21/2020 0254   GLUCOSE 113 (H) 12/21/2020 0254   BUN 18 12/21/2020 0254   CREATININE 1.16 12/21/2020 0254   CALCIUM 8.3 (L) 12/21/2020 0254   GFRNONAA >60 12/21/2020 0254   GFRAA 51 (L) 11/25/2015 1210    INR    Component Value Date/Time   INR 1.00 04/27/2015 0655     Intake/Output Summary (Last 24 hours) at 12/21/2020 0753 Last data filed at 12/21/2020 0340 Gross per 24 hour  Intake 2089.45 ml  Output 1550 ml  Net 539.45 ml     Assessment:  80 y.o. male is s/p angiography for chronic limb threatening ischemia right lower extremity with fourth toe ulceration.  Plan: OR today for right common femoral endarterectomy and femoral to below-knee popliteal artery bypass.  I have again discussed the risk benefits alternatives to include blood loss requiring transfusion Risk for coronary artery ischemia Risk for infection in the future Risk for limb loss even with successful bypass Risk for graft occlusion in the future even in the short-term  He demonstrates good  understanding and plans for surgery today.  Meagen Limones C. Donzetta Matters, MD [facility] Office: [phone] Pager: [phone]  12/21/2020 7:53 AM

## 2020-12-21 NOTE — Anesthesia Preprocedure Evaluation (Addendum)
Anesthesia Evaluation  Patient identified by MRN, date of birth, ID band Patient awake    Reviewed: Allergy & Precautions, NPO status , Patient's Chart, lab work & pertinent test results  Airway Mallampati: II  TM Distance: >3 FB Neck ROM: Full    Dental  (+) Edentulous Upper,  A few remaining teeth on the bottom:   Pulmonary COPD, former smoker,    Pulmonary exam normal breath sounds clear to auscultation       Cardiovascular hypertension, Pt. on medications + Peripheral Vascular Disease  Normal cardiovascular exam Rhythm:Regular Rate:Normal     Neuro/Psych Blind right eye CVA    GI/Hepatic   Endo/Other    Renal/GU Renal InsufficiencyRenal disease  negative genitourinary   Musculoskeletal  (+) Arthritis ,   Abdominal   Peds  Hematology   Anesthesia Other Findings   Reproductive/Obstetrics negative OB ROS                           Anesthesia Physical Anesthesia Plan  ASA: 3  Anesthesia Plan: General   Post-op Pain Management: Tylenol PO (pre-op)   Induction: Intravenous  PONV Risk Score and Plan: 2  Airway Management Planned: Oral ETT  Additional Equipment: Arterial line  Intra-op Plan:   Post-operative Plan: Extubation in OR  Informed Consent: I have reviewed the patients History and Physical, chart, labs and discussed the procedure including the risks, benefits and alternatives for the proposed anesthesia with the patient or authorized representative who has indicated his/her understanding and acceptance.     Dental advisory given  Plan Discussed with: CRNA and Anesthesiologist  Anesthesia Plan Comments:        Anesthesia Quick Evaluation

## 2020-12-21 NOTE — Anesthesia Procedure Notes (Signed)
Arterial Line Insertion Start/End11/22/2022 10:00 AM, 12/21/2020 10:45 AM Performed by: Merlinda Frederick, MD, anesthesiologist  Patient location: Pre-op. Preanesthetic checklist: patient identified, risks and benefits discussed and surgical consent Lidocaine 1% used for infiltration radial was placed Catheter size: 20 G Hand hygiene performed  and Seldinger technique used  Attempts: 5 or more (Several attempts by CRNA x 2 and myself) Procedure performed using ultrasound guided technique. Ultrasound Notes:anatomy identified, needle tip was noted to be adjacent to the nerve/plexus identified and no ultrasound evidence of intravascular and/or intraneural injection Following insertion, dressing applied and Biopatch. Post procedure assessment: normal  Patient tolerated the procedure well with no immediate complications.

## 2020-12-21 NOTE — Op Note (Signed)
Patient name: Lance Dillon MRN: [id number] DOB: 1940/10/07 Sex: male  12/21/2020 Pre-operative Diagnosis: Chronic right lower extremity limb threatening ischemia with right fourth toe ulceration Post-operative diagnosis:  Same Surgeon:  Eda Paschal. Donzetta Matters, MD Assistants: Gae Gallop, MD; Leontine Locket, [place] Procedure Performed: 1.  Re-exposure right common femoral artery and right below-knee popliteal artery 2.  Right common femoral and profundofemoral endarterectomy 3.  Right common femoral to below-knee popliteal artery bypass with 6 mm ringed PTFE   Indications: 80 year old male with previous history of right common femoral endarterectomy and right femoral to popliteal artery bypass with vein.  This has now thrombosed.  Patient has ulceration of his right great toe.  He is indicated for the above-noted procedure.  Assistants were necessary to expedite this high complexity case and to facilitate exposure  Findings: Common femoral artery was completely occluded with very friable and gelatinous plaques that were obviously chronic.  After extensive endarterectomy we did establish good backbleeding from the profunda and strong antegrade bleeding from the external iliac artery which was soft for clamping.  Below the knee there was dense scar tissue made exposure difficult.  Ultimately the bypass was performed under tourniquet and we reopened the hood down to where there was a good lumen of the below-knee popliteal artery.  At completion there was a strong posterior tibial signal that was graft dependent.   Procedure:  The patient was identified in the holding area and taken to the operating was placed supine operative table and general anesthesia was induced.  The catheter was attempted be placed but we could not place despite multiple attempts.  We elected that the case would be under 3 hours and that we could proceed without catheter.  He was sterilely prepped draped in the right lower  extremity usual fashion and antibiotics were administered.  Timeout was called.  Concomitantly I began in the groin Dr. Scot Dock below the knee.  We reexposed through both previous incisions.  In the groin we expect under the inguinal ligament to the external leg artery placed a vessel loop around this.  I exposed the profunda however I could not get good exposure due to dense scar tissue ultimately made sure I could clamp there.  The SFA I was able to clamp although this was chronically occluded.  Below the knee Dr. Scot Dock was able to expose to the level of the previous toe to the bypass with her multiple veins making difficult exposure of the popliteal artery distal to this.  We elected to tunnel 6 mm ringed PTFE anatomically between the 2 incisions and then heparinized.  Above the knee we placed a tourniquet and then exsanguinated the leg and inflated the tourniquet to 250 mmHg.  Dr. Scot Dock was able to open from the hood of the previous graft down to the popliteal artery and there was a very good lumen.  He sewed the graft into side there with 6-0 Prolene suture.  Concomitantly I worked on the common femoral artery which was opened but was completely occluded.  Extensive endarterectomy was performed of the common femoral artery up onto the inguinal ligament we had very strong inflow.  We took meticulous care of the profunda to establish good backbleeding and this was clamped.  We then made along patulous anastomosis proximally that was then decide.  Prior to completion we then allowed flushing all directions thoroughly irrigated with heparinized saline.  Upon completion there were good signals distally in the posterior tibial and peroneal arteries.  We administered 50 mg of protamine.  Unfortunately there was a vein injury when I remove the clamp from the external iliac artery.  This appeared to be a side branch of the external iliac vein.  This is where the most of her blood loss occurred did require repair of  the vein with lateral venotomy with 4-0 Prolene 5-0 Prolene sutures.  We lost about 500 mg of blood during that time.  We do not have to reclamped any of the arteries.  After this we elected to administer 1 unit of packed red blood cells the patient was stable however.  We obtain hemostasis and our wounds we irrigated and closed in layers of Vicryl and Monocryl.  Dermabond is placed to the level of the skin.  He was awakened from anesthesia having tolerated procedure well without immediate complication.  All counts were correct at completion.  EBL: 750cc   Sarah Baez C. Donzetta Matters, MD [facility] Office: [phone] Pager: [phone]

## 2020-12-21 NOTE — OR Nursing (Signed)
Foley catheter insertion attempted twice by Dr. Scot Dock and Dr. Donzetta Matters prior the surgery but no success on each attempt. Proceeded to procedure without foley, both surgeons aware.

## 2020-12-21 NOTE — Discharge Instructions (Addendum)
[facility]  Discharge instructions  Lower Extremity Bypass Surgery  Please refer to the following instruction for your post-procedure care. Your surgeon or physician assistant will discuss any changes with you.  Activity  You are encouraged to walk as much as you can. You can slowly return to normal activities during the month after your surgery. Avoid strenuous activity and heavy lifting until your doctor tells you it's OK. Avoid activities such as vacuuming or swinging a golf club. Do not drive until your doctor give the OK and you are no longer taking prescription pain medications. It is also normal to have difficulty with sleep habits, eating and bowel movement after surgery. These will go away with time.  Bathing/Showering  Shower daily after you go home. Do not soak in a bathtub, hot tub, or swim until the incision heals completely.  Incision Care  Clean your incision with mild soap and water. Shower every day. Pat the area dry with a clean towel. You do not need a bandage unless otherwise instructed. Do not apply any ointments or creams to your incision. If you have open wounds you will be instructed how to care for them or a visiting nurse may be arranged for you. If you have staples or sutures along your incision they will be removed at your post-op appointment. You may have skin glue on your incision. Do not peel it off. It will come off on its own in about one week.  Wash the groin wound with soap and water daily and pat dry. (No tub bath-only shower)  Then put a dry gauze or washcloth in the groin to keep this area dry to help prevent wound infection.  Do this daily and as needed.  Do not use Vaseline or neosporin on your incisions.  Only use soap and water on your incisions and then protect and keep dry.  Keep dry gauze between toes and change this daily.   Diet  Resume your normal diet. There are no special food restrictions following this  procedure. A low fat/ low cholesterol diet is recommended for all patients with vascular disease. In order to heal from your surgery, it is CRITICAL to get adequate nutrition. Your body requires vitamins, minerals, and protein. Vegetables are the best source of vitamins and minerals. Vegetables also provide the perfect balance of protein. Processed food has little nutritional value, so try to avoid this.  Medications  Resume taking all your medications unless your doctor or physician assistant tells you not to. If your incision is causing pain, you may take over-the-counter pain relievers such as acetaminophen (Tylenol). If you were prescribed a stronger pain medication, please aware these medication can cause nausea and constipation. Prevent nausea by taking the medication with a snack or meal. Avoid constipation by drinking plenty of fluids and eating foods with high amount of fiber, such as fruits, vegetables, and grains. Take Colace 100 mg (an over-the-counter stool softener) twice a day as needed for constipation.  Do not take Tylenol if you are taking prescription pain medications.  Follow Up  Our office will schedule a follow up appointment 2-3 weeks following discharge.  Please call us immediately for any of the following conditions  Severe or worsening pain in your legs or feet while at rest or while walking Increase pain, redness, warmth, or drainage (pus) from your incision site(s) Fever of 101 degree or higher The swelling in your leg with the bypass suddenly worsens and becomes more  painful than when you were in the hospital If you have been instructed to feel your graft pulse then you should do so every day. If you can no longer feel this pulse, call the office immediately. Not all patients are given this instruction.  Leg swelling is common after leg bypass surgery.  The swelling should improve over a few months following surgery. To improve the swelling, you may elevate your legs  above the level of your heart while you are sitting or resting. Your surgeon or physician assistant may ask you to apply an ACE wrap or wear compression (TED) stockings to help to reduce swelling.  Reduce your risk of vascular disease  Stop smoking. If you would like help call [facility] at 1-800-QUIT-NOW 225-824-7966) or [facility] at [phone].  Manage your cholesterol Maintain a desired weight Control your diabetes weight Control your diabetes Keep your blood pressure down  If you have any questions, please call the office at [phone]

## 2020-12-21 NOTE — Anesthesia Procedure Notes (Addendum)
Procedure Name: Intubation Date/Time: 12/21/2020 12:15 PM Performed by: Vonna Drafts, CRNA Pre-anesthesia Checklist: Patient identified, Emergency Drugs available, Suction available and Patient being monitored Patient Re-evaluated:Patient Re-evaluated prior to induction Oxygen Delivery Method: Circle system utilized Preoxygenation: Pre-oxygenation with 100% oxygen Induction Type: IV induction Ventilation: Mask ventilation without difficulty Laryngoscope Size: Mac and 4 Grade View: Grade I Tube type: Oral Tube size: 7.5 mm Number of attempts: 1 Airway Equipment and Method: Stylet and Oral airway Placement Confirmation: ETT inserted through vocal cords under direct vision, positive ETCO2 and breath sounds checked- equal and bilateral Secured at: 23 cm Tube secured with: Tape Dental Injury: Teeth and Oropharynx as per pre-operative assessment

## 2020-12-22 ENCOUNTER — Encounter (HOSPITAL_COMMUNITY): Payer: Self-pay | Admitting: Vascular Surgery

## 2020-12-22 DIAGNOSIS — Z95828 Presence of other vascular implants and grafts: Secondary | ICD-10-CM

## 2020-12-22 DIAGNOSIS — I70235 Atherosclerosis of native arteries of right leg with ulceration of other part of foot: Secondary | ICD-10-CM

## 2020-12-22 LAB — TYPE AND SCREEN
ABO/RH(D): O POS
Antibody Screen: NEGATIVE
Unit division: 0
Unit division: 0

## 2020-12-22 LAB — CBC
HCT: 28.6 % — ABNORMAL LOW (ref 39.0–52.0)
Hemoglobin: 9.7 g/dL — ABNORMAL LOW (ref 13.0–17.0)
MCH: 31.3 pg (ref 26.0–34.0)
MCHC: 33.9 g/dL (ref 30.0–36.0)
MCV: 92.3 fL (ref 80.0–100.0)
Platelets: 121 10*3/uL — ABNORMAL LOW (ref 150–400)
RBC: 3.1 MIL/uL — ABNORMAL LOW (ref 4.22–5.81)
RDW: 13.7 % (ref 11.5–15.5)
WBC: 10.1 10*3/uL (ref 4.0–10.5)
nRBC: 0 % (ref 0.0–0.2)

## 2020-12-22 LAB — BASIC METABOLIC PANEL
Anion gap: 10 (ref 5–15)
BUN: 20 mg/dL (ref 8–23)
CO2: 17 mmol/L — ABNORMAL LOW (ref 22–32)
Calcium: 7.6 mg/dL — ABNORMAL LOW (ref 8.9–10.3)
Chloride: 105 mmol/L (ref 98–111)
Creatinine, Ser: 1.27 mg/dL — ABNORMAL HIGH (ref 0.61–1.24)
GFR, Estimated: 57 mL/min — ABNORMAL LOW (ref >60)
Glucose, Bld: 237 mg/dL — ABNORMAL HIGH (ref 70–99)
Potassium: 4.4 mmol/L (ref 3.5–5.1)
Sodium: 132 mmol/L — ABNORMAL LOW (ref 135–145)

## 2020-12-22 LAB — BPAM RBC
Blood Product Expiration Date: 202212202359
Blood Product Expiration Date: 202212202359
ISSUE DATE / TIME: 202211221518
ISSUE DATE / TIME: 202211221707
Unit Type and Rh: 5100
Unit Type and Rh: 5100

## 2020-12-22 LAB — LIPID PANEL
Cholesterol: 81 mg/dL (ref 0–200)
HDL: 37 mg/dL — ABNORMAL LOW (ref >40)
LDL Cholesterol: 34 mg/dL (ref 0–99)
Total CHOL/HDL Ratio: 2.2 RATIO
Triglycerides: 52 mg/dL (ref <150)
VLDL: 10 mg/dL (ref 0–40)

## 2020-12-22 MED ORDER — LATANOPROST 0.005 % OP SOLN
1.0000 [drp] | Freq: Every day | OPHTHALMIC | Status: DC
  Administered 2020-12-22 – 2020-12-24 (×3): 1 [drp] via OPHTHALMIC
  Filled 2020-12-22: qty 2.5

## 2020-12-22 MED ORDER — METOPROLOL SUCCINATE ER 25 MG PO TB24
25.0000 mg | ORAL_TABLET | Freq: Every day | ORAL | Status: DC
  Filled 2020-12-22 (×2): qty 1

## 2020-12-22 NOTE — Progress Notes (Signed)
Pharmacy Antibiotic Note  Lance Dillon is a 80 y.o. male admitted on 12/19/2020 with critical right lower extremity ischemia with fourth toe ulceration s/p fem-pop bypass.  Pharmacy has been consulted for vancomycin and cefepime dosing.   Scr slightly worse. Per [facility], plan for antibiotics for a few more days.  11/23 Vancomycin 1000mg  Q 24 hr Scr used: 1.27 mg/dL Weight: 73 kg Vd coeff: 0.72 L/kg Est AUC: 457  Plan: Cefepime 2g q12hr Decrease Vancomycin to 1000 mg Q 24 hr Monitor cultures, clinical status, renal fx, vanc level Narrow abx as able and f/u duration     Height: 5\' 8"  (172.7 cm) Weight: 73 kg (160 lb 15 oz) IBW/kg (Calculated) : 68.4  Temp (24hrs), Avg:97.9 F (36.6 C), Min:97.1 F (36.2 C), Max:98.5 F (36.9 C)  Recent Labs  Lab 12/19/20 1123 12/19/20 1128 12/20/20 0253 12/21/20 0254 12/22/20 0121  WBC  --  10.2 7.0 7.0 10.1  CREATININE  --  1.21 1.27* 1.16 1.27*  LATICACIDVEN 1.2  --   --   --   --      Estimated Creatinine Clearance: 44.9 mL/min (A) (by C-G formula based on SCr of 1.27 mg/dL (H)).    Allergies  Allergen Reactions   Avelox [Moxifloxacin Hcl In Nacl] Diarrhea   Moxifloxacin Diarrhea   Cilostazol Diarrhea    Antimicrobials this admission: cefepime 11/20 >>  vancomycin 11/20 >>  metronidazole 11/20   Microbiology results: 11/22 MRSA + 11/20 Bcx NGTD   Thank you for allowing pharmacy to be a part of this patient's care. Benetta Spar, PharmD, BCPS, BCCP Clinical Pharmacist  Please check AMION for all [place] phone numbers After 10:00 PM, call [place] [phone]

## 2020-12-22 NOTE — Progress Notes (Signed)
Mobility Specialist: Progress Note   12/22/20 1638  Mobility  Activity Ambulated in hall  Level of Assistance Contact guard assist, steadying assist  Assistive Device Front wheel walker  Distance Ambulated (ft) 40 ft  Mobility Ambulated with assistance in hallway  Mobility Response Tolerated well  Mobility performed by Mobility specialist  $Mobility charge 1 Mobility   Pre-Mobility: 93 HR, 100% SpO2 Post-Mobility: 94 HR, 98% SpO2  Pt independent to sit EOB from supine and contact guard to stand. Pt c/o pain in his RLE during ambulation, no rating given. Pt back to bed after walk with call bell and phone in reach. Pt's wife present in the room.   [facility] Demetri Goshert Mobility Specialist Mobility Specialist Phone #1: [phone] Mobility Specialist Phone #2: [phone]

## 2020-12-22 NOTE — Anesthesia Postprocedure Evaluation (Signed)
Anesthesia Post Note  Patient: Lance Dillon  Procedure(s) Performed: RIGHT FEMORAL-POPLITEAL ARTERY BYPASS GRAFT WITH PTFE (Right) RIGHT FEMORAL ENDARTERECTOMY (Right)     Patient location during evaluation: Other Anesthesia Type: General Level of consciousness: awake and alert Pain management: pain level controlled Vital Signs Assessment: post-procedure vital signs reviewed and stable Respiratory status: spontaneous breathing, nonlabored ventilation and respiratory function stable Cardiovascular status: blood pressure returned to baseline and stable Postop Assessment: no apparent nausea or vomiting Anesthetic complications: no   No notable events documented.  Last Vitals:  Vitals:   12/21/20 2333 12/22/20 0406  BP: 104/66 (!) 106/56  Pulse: 100 80  Resp: 20 13  Temp: (!) 36.3 C 36.6 C  SpO2: 97% 100%    Last Pain:  Vitals:   12/22/20 0406  TempSrc: Oral  PainSc:                  Aryn Kops,W. EDMOND

## 2020-12-22 NOTE — Evaluation (Signed)
Physical Therapy Evaluation Patient Details Name: RAYMOND BHARDWAJ MRN: [id number] DOB: December 18, 1940 Today's Date: 12/22/2020  History of Present Illness  Pt is an 80 y.o. male admitted on 12/19/20 with an infected R foot ulcer. S/p LE angiogram on 11/21 and femoral to below knee popliteal bypass 11/22. 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   Clinical Impression  Pt presents with decreased strength, balance, functional mobility and activity tolerance secondary to above. PTA, pt was independent in ADLs, working light duty, and ambulated with the use of a cane in the home and a rollator in the community. Despite pain today, pt was agreeable to OOB mobility, ambulating a short distance in the room with a RW and min guard. He requested to stay in the room due to urinary incontinence. Pt would benefit from [facility] PT after discharge to regain PLOF and maximize independence. Pt was active PTA and would like to return to that. Will continue to follow acutely to address short-term PT goals.        Recommendations for follow up therapy are one component of a multi-disciplinary discharge planning process, led by the attending physician.  Recommendations may be updated based on patient status, additional functional criteria and insurance authorization.  Follow Up Recommendations Home health PT    Assistance Recommended at Discharge PRN  Functional Status Assessment Patient has had a recent decline in their functional status and demonstrates the ability to make significant improvements in function in a reasonable and predictable amount of time.  Equipment Recommendations  Other (comment) (possible RW - clarify with pt)    Recommendations for Other Services       Precautions / Restrictions Precautions Precautions: Fall Restrictions Weight Bearing Restrictions: No       Mobility  Bed Mobility Overal bed mobility: Modified Independent             General bed mobility comments: Pt able to go to supine <> sit with increased time, use of bedrails, and HOB elevated    Transfers Overall transfer level: Needs assistance Equipment used: Rolling walker (2 wheels) Transfers: Sit to/from Stand Sit to Stand: Min guard           General transfer comment: Requested to use RW instead of cane; took increased time to elevate and relied heavily on UE; min guard for safety    Ambulation/Gait Ambulation/Gait assistance: Min guard Gait Distance (Feet): 24 Feet Assistive device: Rolling walker (2 wheels) Gait Pattern/deviations: Step-through pattern;Decreased step length - left;Decreased step length - right;Decreased weight shift to right;Decreased stride length;Trunk flexed Gait velocity: decreased     General Gait Details: ambulated in room with min guard for safety; cues to put as much weight as possible on the RLE. Pt requested to stay in room due to urinary incontinence.  Stairs            Wheelchair Mobility    Modified Rankin (Stroke Patients Only)       Balance Overall balance assessment: Needs assistance Sitting-balance support: No upper extremity supported;Feet supported Sitting balance-Leahy Scale: Good Sitting balance - Comments: Pt able to sit EOB and weight shift with no physical assist   Standing balance support: Bilateral upper extremity supported;During functional activity;Reliant on assistive device for balance Standing balance-Leahy Scale: Poor Standing balance comment: reliant on UE support to offload painful RLE and maintain balance during static standing and ambulation  Pertinent Vitals/Pain Pain Assessment: 0-10 Pain Score: 5  Pain Location: R LE Pain Descriptors / Indicators: Tingling;Burning Pain Intervention(s): Premedicated before session;Monitored during session;Limited  activity within patient's tolerance    Home Living Family/patient expects to be discharged to:: Private residence Living Arrangements: Spouse/significant other Available Help at Discharge: Family;Available 24 hours/day Type of Home: House Home Access: Stairs to enter Entrance Stairs-Rails: Psychiatric nurse of Steps: 2   Home Layout: One level Home Equipment: Tub bench;Rollator (4 wheels);Cane - single point;Hand held shower head      Prior Function Prior Level of Function : Independent/Modified Independent;Working/employed             Mobility Comments: use of cane in the home and for short distances, use of Rollator outside of the home. Does not like to use rollator inside because it does not fit well. Denies any falls in past year. ADLs Comments: Pt reports Modified Independent with ADLs, IADLs, was assisting wife with LB dressing since her hip fx in August. 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)     Hand Dominance   Dominant Hand: Right    Extremity/Trunk Assessment   Upper Extremity Assessment Upper Extremity Assessment: Overall WFL for tasks assessed    Lower Extremity Assessment Lower Extremity Assessment: Generalized weakness;RLE deficits/detail RLE Deficits / Details: slightly decreased knee flex/ext AROM    Cervical / Trunk Assessment Cervical / Trunk Assessment: Normal  Communication   Communication: No difficulties  Cognition Arousal/Alertness: Awake/alert Behavior During Therapy: WFL for tasks assessed/performed Overall Cognitive Status: Within Functional Limits for tasks assessed                                 General Comments: Does benefit from cues for safety but [facility] cognitively        General Comments General comments (skin integrity, edema, etc.): Wife and son present and supportive. Pt motivated to get OOB depite pain. He was pleased with his mobility and states he did  better than he thought he would.    Exercises     Assessment/Plan    PT Assessment Patient needs continued PT services  PT Problem List Decreased strength;Decreased mobility;Decreased range of motion;Decreased activity tolerance;Decreased balance;Pain       PT Treatment Interventions DME instruction;Therapeutic exercise;Balance training;Gait training;Stair training;Functional mobility training;Therapeutic activities;Patient/family education    PT Goals (Current goals can be found in the Care Plan section)  Acute Rehab PT Goals Patient Stated Goal: to get back to working PT Goal Formulation: With patient Time For Goal Achievement: 01/05/21 Potential to Achieve Goals: Good    Frequency Min 3X/week   Barriers to discharge        Co-evaluation               AM-PAC PT "6 Clicks" Mobility  Outcome Measure Help needed turning from your back to your side while in a flat bed without using bedrails?: None Help needed moving from lying on your back to sitting on the side of a flat bed without using bedrails?: None Help needed moving to and from a bed to a chair (including a wheelchair)?: A Little Help needed standing up from a chair using your arms (e.g., wheelchair or bedside chair)?: A Little Help needed to walk in hospital room?: A Little Help needed climbing 3-5 steps with a railing? : A Lot 6 Click Score: 19    End of  Session Equipment Utilized During Treatment: Gait belt Activity Tolerance: Patient tolerated treatment well Patient left: in bed;with bed alarm set;with family/visitor present;with call bell/phone within reach Nurse Communication: Mobility status PT Visit Diagnosis: Unsteadiness on feet (R26.81);Other abnormalities of gait and mobility (R26.89);Difficulty in walking, not elsewhere classified (R26.2);Muscle weakness (generalized) (M62.81);Pain Pain - Right/Left: Right Pain - part of body: Leg;Ankle and joints of foot    Time: 3335-4562 PT Time  Calculation (min) (ACUTE ONLY): 27 min   Charges:   PT Evaluation $PT Eval Moderate Complexity: 1 [address], SPT   Brandon Melnick 12/22/2020, 9:57 AM

## 2020-12-22 NOTE — Evaluation (Signed)
Occupational Therapy Evaluation Patient Details Name: Lance Dillon MRN: [id number] DOB: 1940/10/25 Today's Date: 12/22/2020   History of Present Illness Pt is an 80 y.o. male admitted on 12/19/20 with an infected R foot ulcer. S/p LE angiogram on 11/21 and femoral to below knee popliteal bypass 11/22. 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   Clinical Impression   PTA, pt lives with spouse and reports Modified Independence with ADLs, IADLs and mobility (cane inside and Rollator outside). 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 Pt presents with deficits in R LE pain, endurance and standing balance. Pt overall min guard for short mobility using RW, limited in further distance d/t pain. Pt requires Setup for UB ADLs and Min A for LB ADLs. Anticipate once pain improves, pt will quickly return to baseline. However, due to deficits and typically active lifestyle, would recommend HHOT follow-up to further maximize ADL/IADL independence. Recommend RW for mobility in the home to offload painful LE. Will continue to follow acutely.      Recommendations for follow up therapy are one component of a multi-disciplinary discharge planning process, led by the attending physician.  Recommendations may be updated based on patient status, additional functional criteria and insurance authorization.   Follow Up Recommendations  Home health OT    Assistance Recommended at Discharge Intermittent Supervision/Assistance  Functional Status Assessment  Patient has had a recent decline in their functional status and demonstrates the ability to make significant improvements in function in a reasonable and predictable amount of time.  Equipment Recommendations  Other (comment) (Rolling walker)     Recommendations for Other Services       Precautions / Restrictions Precautions Precautions: Fall Restrictions Weight Bearing Restrictions: No      Mobility Bed Mobility Overal bed mobility: Modified Independent                  Transfers Overall transfer level: Needs assistance Equipment used: Rolling walker (2 wheels);None Transfers: Sit to/from Stand Sit to Stand: Min assist           General transfer comment: Min A with trial without AD - unable to stand fully upright and noted to reach out for support. Stability and posture improved with RW use with decreased assist needed to stand at min guard      Balance Overall balance assessment: Needs assistance Sitting-balance support: No upper extremity supported;Feet supported Sitting balance-Leahy Scale: Good     Standing balance support: Bilateral upper extremity supported;During functional activity;Reliant on assistive device for balance Standing balance-Leahy Scale: Poor Standing balance comment: reliant on UE support to offload painful LE and maintain balance                           ADL either performed or assessed with clinical judgement   ADL Overall ADL's : Needs assistance/impaired Eating/Feeding: Independent;Sitting   Grooming: Modified independent;Sitting;Wash/dry face;Oral care Grooming Details (indicate cue type and reason): and to shave face seated at sink. Pt unable to stand for duration of tasks due to LE pain Upper Body Bathing: Set up;Sitting   Lower Body Bathing: Minimal assistance;Sit to/from stand   Upper Body Dressing : Set up;Sitting   Lower Body Dressing: Minimal assistance;Sit to/from stand Lower Body Dressing Details (indicate cue  type and reason): likely some assist to don over waist in standing due to pain/balance deficits. Pt able to don sock to affected LE bed level - good flexibility Toilet Transfer: Min guard;Ambulation;Rolling walker (2 wheels)   Toileting-  Clothing Manipulation and Hygiene: Minimal assistance;Sit to/from stand       Functional mobility during ADLs: Min guard;Rolling walker (2 wheels) General ADL Comments: Pt limited by R LE pain in standing limiting standing tolerance for ADL tasks. Educated on energy conservation (performing tasks seated at sink at home if pain too much to decrease fall risk), use of tub bench at home as likely unable to put full pressure through R LE     Vision Baseline Vision/History: 1 Wears glasses;6 Macular Degeneration Ability to See in Adequate Light: 2 Moderately impaired Patient Visual Report: No change from baseline Vision Assessment?: Vision impaired- to be further tested in functional context Additional Comments: hx of macular degeneration, reports use of contrasting surfaces/items at home as compensatory strategy     Perception     Praxis      Pertinent Vitals/Pain Pain Assessment: 0-10 Pain Score: 7  Pain Location: R LE Pain Descriptors / Indicators: Guarding;Grimacing;Sore Pain Intervention(s): Monitored during session;Limited activity within patient's tolerance;Patient requesting pain meds-RN notified     Hand Dominance Right   Extremity/Trunk Assessment Upper Extremity Assessment Upper Extremity Assessment: Overall WFL for tasks assessed   Lower Extremity Assessment Lower Extremity Assessment: Defer to PT evaluation   Cervical / Trunk Assessment Cervical / Trunk Assessment: Normal   Communication Communication Communication: No difficulties   Cognition Arousal/Alertness: Awake/alert Behavior During Therapy: WFL for tasks assessed/performed Overall Cognitive Status: Within Functional Limits for tasks assessed                                 General Comments: Does benefit from cues for safety but [facility] cognitively     General Comments  VSS on RA, denies dizziness. Wife and son present during session    Exercises     Shoulder [place] expects to be discharged to:: Private residence Living Arrangements: Spouse/significant other Available Help at Discharge: Family;Available 24 hours/day Type of Home: House Home Access: Stairs to enter [employer] of Steps: 2 Entrance Stairs-Rails: Right;Left Home Layout: One level     Bathroom Shower/Tub: Teacher, early years/pre: Handicapped height     Home Equipment: Tub bench;Rollator (4 wheels);Cane - single point;Hand held shower head          Prior Functioning/Environment Prior Level of Function : Independent/Modified Independent;Driving;Working/employed             Mobility Comments: use of cane in the home and for short distances, use of Rollator outside of the home. Denies any falls in past year ADLs Comments: Pt reports Modified Independent with ADLs, IADLs, was assisting wife with LB dressing since her hip fx in August. 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)        OT Problem List: Decreased strength;Decreased activity tolerance;Impaired balance (sitting and/or standing);Decreased knowledge of use of DME or AE;Pain      OT Treatment/Interventions: Self-care/ADL training;Therapeutic exercise;DME and/or AE instruction;Therapeutic activities;Patient/family education;Balance training    OT Goals(Current goals can be found in the care plan section) Acute Rehab OT Goals Patient Stated Goal: return to independence quickly, decrease pain OT Goal Formulation: With patient  Time For Goal Achievement: 01/06/21 Potential to Achieve Goals: Good  OT Frequency: Min 2X/week   Barriers to D/C:            Co-evaluation              AM-PAC OT "6 Clicks" Daily Activity     Outcome Measure Help from another person eating meals?: None Help from another person taking care of personal grooming?: None Help from another person toileting, which includes using toliet, bedpan, or urinal?:  A Little Help from another person bathing (including washing, rinsing, drying)?: A Little Help from another person to put on and taking off regular upper body clothing?: A Little Help from another person to put on and taking off regular lower body clothing?: A Little 6 Click Score: 20   End of Session Equipment Utilized During Treatment: Gait belt;Rolling walker (2 wheels) Nurse Communication: Mobility status;Patient requests pain meds  Activity Tolerance: Patient tolerated treatment well;Patient limited by pain Patient left: in bed;with family/visitor present;Other (comment) (sitting EOB with PT entering)  OT Visit Diagnosis: Unsteadiness on feet (R26.81);Other abnormalities of gait and mobility (R26.89);Pain Pain - Right/Left: Right Pain - part of body: Leg                Time: 0742-0821 OT Time Calculation (min): 39 min Charges:  OT General Charges $OT Visit: 1 Visit OT Evaluation $OT Eval Moderate Complexity: 1 Mod OT Treatments $Self Care/Home Management : 8-22 mins  Malachy Chamber, OTR/L Acute Rehab Services Office: [phone]   Layla Maw 12/22/2020, 8:36 AM

## 2020-12-22 NOTE — Progress Notes (Signed)
PHARMACIST LIPID MONITORING   Lance Dillon is a 80 y.o. male admitted on 12/19/2020 with  critical right lower extremity ischemia with fourth toe ulceration s/p right common femoral endarterectomy and femoral to below-knee popliteal artery bypass.  Pharmacy has been consulted to optimize lipid-lowering therapy with the indication of secondary prevention for clinical ASCVD.  Recent Labs:  Lipid Panel (last 6 months):   Lab Results  Component Value Date   CHOL 81 12/22/2020   TRIG 52 12/22/2020   HDL 37 (L) 12/22/2020   CHOLHDL 2.2 12/22/2020   VLDL 10 12/22/2020   LDLCALC 34 12/22/2020    Hepatic function panel (last 6 months):   Lab Results  Component Value Date   AST 16 12/19/2020   ALT 15 12/19/2020   ALKPHOS 68 12/19/2020   BILITOT 1.3 (H) 12/19/2020    SCr (since admission):   Serum creatinine: 1.27 mg/dL (H) 12/22/20 0121 Estimated creatinine clearance: 44.9 mL/min (A)  Current therapy and lipid therapy tolerance Current lipid-lowering therapy: rosuvastatin 10mg  Previous lipid-lowering therapies (if applicable): n/a Documented or reported allergies or intolerances to lipid-lowering therapies (if applicable): none  Assessment:   No changes indicated at this time given LDL < 55.   Plan:    1.Statin intensity (high intensity recommended for all patients regardless of the LDL):  No statin changes. The patient is already on a high intensity statin.  2.Add ezetimibe (if any one of the following):   Not indicated at this time.  3.Refer to lipid clinic:   No  4.Follow-up with:  Primary care provider - Deland Pretty, MD  5.Follow-up labs after discharge:  No changes in lipid therapy, repeat a lipid panel in one year.      Benetta Spar, PharmD, BCPS, BCCP Clinical Pharmacist  Please check AMION for all [facility] phone numbers After 10:00 PM, call [place] [phone]

## 2020-12-22 NOTE — Progress Notes (Signed)
   VASCULAR SURGERY ASSESSMENT & PLAN:   POD 1 REDO RIGHT FEM-BK-POP (PTFE): Graft is patent. Brisk doppler flow in right DP and PT.   VQI: On ASA and a statin.   DVT PROPHYLAXIS: On SQ heparin.  ID: On maxipime and vanco.   Continue meticulous wound care to right 5th and 4th toes. Will likely need several days of IV ABx, then the wounds can be followed as as outpt.  SUBJECTIVE:   No complaints.  PHYSICAL EXAM:   Vitals:   12/21/20 1730 12/21/20 1930 12/21/20 2333 12/22/20 0406  BP: (!) 111/57 117/70 104/66 (!) 106/56  Pulse: 96 (!) 103 100 80  Resp: 18 17 20 13   Temp: 97.8 F (36.6 C) 97.6 F (36.4 C) (!) 97.4 F (36.3 C) 97.9 F (36.6 C)  TempSrc: Oral Oral Oral Oral  SpO2: 98% 100% 97% 100%  Weight:      Height:       Incisions look fine. Brisk DP and PT with the doppler 4th and 5th toes stable. No drainage. Moderate cellulitis.   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

## 2020-12-22 NOTE — Progress Notes (Addendum)
Mobility Specialist: Progress Note   12/22/20 1126  Mobility  Activity Ambulated in room  Level of Assistance Contact guard assist, steadying assist  Assistive Device Front wheel walker  Distance Ambulated (ft) 24 ft  Mobility Ambulated with assistance in room  Mobility Response Tolerated well  Mobility performed by Mobility specialist  $Mobility charge 1 Mobility   During Mobility: 121 HR Post-Mobility: 94 HR  Pt c/o 8/10 pain during ambulation but said it was easing up after returning to bed, otherwise no c/o. Pt back to bed after walk with call bell and phone at his side. Family present in the room. Will f/u again later today.   [facility] Batu Cassin Mobility Specialist Mobility Specialist Phone #1: [phone] Mobility Specialist Phone #2: [phone]

## 2020-12-22 NOTE — Progress Notes (Signed)
PROGRESS NOTE  Lance Dillon IWP:[id number] DOB: 11/23/1940 DOA: 12/19/2020 PCP: Deland Pretty, MD   LOS: 3 days   Brief narrative:  Lance Dillon is a 80 y.o. male with past medical history of peripheral artery disease, renal artery stenosis, carotid stenosis, cerebral aneurysm, COPD, CKD stage II, history of prostate cancer status post prostatectomy, hypertension and hyperlipidemia presented to hospital with ulceration in his right foot for the last 2 weeks.  Patient had been following up with podiatry but had been having worsening pain in spreading erythema.  Patient does have history of right external iliac profunda femoris endarterectomy and right femoral to popliteal bypass in the past.  In the ED, patient was afebrile, normotensive.  No leukocytosis.  Foot x-ray was unremarkable.  Patient was seen by Dr. Stanford Breed, with vascular surgery who had for lower extremity angiogram with possible intervention on 12/20/2020.  During hospitalization, patient underwent angiogram followed by surgical intervention on 12/21/2020.  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   History of right femoral-popliteal bypass.   On  IV vancomycin and cefepime.  Vascular surgery on board and patient underwent angiogram and subsequently underwent Right common femoral and profundofemoral endarterectomy with right common femoral to below-knee popliteal artery bypass on 12/21/2020.  Further plans as per vascular surgery.  Vascular surgery recommends continuation of IV antibiotics at this time.  Physical therapy has recommended home PT on discharge.  CKD stage II with previous history of left renal artery  stenosis. Creatinine at 1.2.  We will continue to monitor closely.   Essential HTN  Continue as needed hydralazine for now.   COPD Did not appear to be in exacerbation.  Continue inhalers.   History of cerebral aneurysm -Asymptomatic.  Unchanged on recent MRA head in August  DVT prophylaxis: heparin injection 5,000 Units Start: 12/22/20 0600 SCD's Start: 12/21/20 1724   Code Status: Full code  Family Communication:  I spoke with the patient's wife, granddaughter at bedside.  Status is: Inpatient  Remains inpatient appropriate because: Status post vascular surgery, IV antibiotics  Consultants: Vascular surgery  Procedures: Angiogram on 12/20/20 Right common femoral and profundofemoral endarterectomy with right common femoral to below-knee popliteal artery bypass on 12/21/2020  Anti-infectives:  Vancomycin and  cefepime   Anti-infectives (From admission, onward)    Start     Dose/Rate Route Frequency Ordered Stop   12/20/20 0800  vancomycin (VANCOREADY) IVPB 1250 mg/250 mL        1,250 mg 166.7 mL/hr over 90 Minutes Intravenous Every 24 hours 12/19/20 1413     12/20/20 0000  ceFEPIme (MAXIPIME) 2 g in sodium chloride 0.9 % 100 mL IVPB        2 g 200 mL/hr over 30 Minutes Intravenous Every 12 hours 12/19/20 1413     12/19/20 1145  vancomycin (VANCOCIN) IVPB 1000 mg/200 mL premix        1,000 mg 200 mL/hr over 60 Minutes Intravenous  Once 12/19/20 1132 12/19/20 1350   12/19/20 1145  ceFEPIme (MAXIPIME) 2 g in sodium chloride 0.9 % 100 mL IVPB        2 g 200 mL/hr over 30 Minutes Intravenous  Once 12/19/20 1132 12/19/20 1223   12/19/20 1145  metroNIDAZOLE (FLAGYL) IVPB 500 mg  Status:  Discontinued        500 mg 100 mL/hr over 60 Minutes Intravenous Every 12 hours 12/19/20 1132 12/19/20 2144      Subjective: Today, patient was seen and examined at bedside.  Patient denies overt pain.  Patient's family at bedside.  Complains of constipation.  Patient denies any  nausea vomiting fever or chills.   Objective: Vitals:   12/22/20 0742 12/22/20 1214  BP: (!) 109/57 (!) 99/51  Pulse: 98 100  Resp: 18 18  Temp: 98.5 F (36.9 C) 97.9 F (36.6 C)  SpO2: 100% 100%    Intake/Output Summary (Last 24 hours) at 12/22/2020 1322 Last data filed at 12/22/2020 0915 Gross per 24 hour  Intake 2948.33 ml  Output 2800 ml  Net 148.33 ml    Filed Weights   12/19/20 1809 12/19/20 2132 12/21/20 0352  Weight: (S) 71.2 kg 71.2 kg 73 kg   Body mass index is 24.47 kg/m.   Physical Exam: GENERAL: Patient is alert awake oriented.  Not in obvious distress. HENT: No scleral pallor or icterus. Pupils equally reactive to light. Oral mucosa is moist NECK: is supple, no gross swelling noted. CHEST: Clear to auscultation. No crackles or wheezes.  Diminished breath sounds bilaterally. CVS: S1 and S2 heard, no murmur. Regular rate and rhythm.  ABDOMEN: Soft, non-tender, bowel sounds are present. EXTREMITIES: No edema.  Right foot with Ace bandage.  Status post incision from vascular surgery. CNS: Cranial nerves are intact. No focal motor deficits. SKIN: Right foot covered with his bandage.  Incision over the right lower extremity from bypass surgery.     Data Review: I have personally reviewed the following labs and imaging studies.  CBC: Recent Labs  Lab 12/19/20 1128 12/20/20 0253 12/21/20 0254 12/21/20 1442 12/22/20 0121  WBC 10.2 7.0 7.0  --  10.1  NEUTROABS 7.5  --   --   --   --   HGB 13.4 11.6* 11.0* 8.5* 9.7*  HCT 39.4 34.3* 32.7* 25.0* 28.6*  MCV 94.3 95.3 95.3  --  92.3  PLT 194 153 154  --  121*    Basic Metabolic Panel: Recent Labs  Lab 12/19/20 1128 12/20/20 0253 12/21/20 0254 12/21/20 1442 12/22/20 0121  NA 137 133* 133* 136 132*  K 3.7 3.5 4.1 4.2 4.4  CL 98 101 102  --  105  CO2 31 25 24   --  17*  GLUCOSE 99 112* 113*  --  237*  BUN 30* 25* 18  --  20  CREATININE 1.21 1.27* 1.16  --  1.27*  CALCIUM 9.7 8.2* 8.3*  --  7.6*     Liver Function Tests: Recent Labs  Lab 12/19/20 1128  AST 16  ALT 15  ALKPHOS 68  BILITOT 1.3*  PROT 7.0  ALBUMIN 4.1    No results for input(s): LIPASE, AMYLASE in the last 168 hours. No results for input(s): AMMONIA in the last 168 hours. Cardiac Enzymes: No results for input(s): CKTOTAL, CKMB, CKMBINDEX, TROPONINI in the last 168 hours. BNP (last 3 results) No results for input(s): BNP in the last 8760 hours.  ProBNP (last 3 results) No results for input(s): PROBNP in the last 8760 hours.  CBG: No results for input(s): GLUCAP in the last 168 hours. Recent Results (from the past 240 hour(s))  Blood culture (routine x 2)     Status: None (Preliminary result)   Collection Time: 12/19/20 10:58 AM   Specimen: BLOOD  Result Value Ref Range Status   Specimen Description BLOOD RIGHT ANTECUBITAL  Final   Special Requests   Final    BOTTLES DRAWN AEROBIC AND ANAEROBIC Blood Culture adequate volume   Culture   Final    NO GROWTH 3 DAYS Performed at [facility], [address], [place], [place] [address]    Report Status PENDING  Incomplete  Blood culture (routine x 2)     Status: None (Preliminary result)   Collection Time: 12/19/20 11:03 AM   Specimen: BLOOD  Result Value Ref Range Status   Specimen Description BLOOD LEFT ANTECUBITAL  Final   Special Requests   Final    BOTTLES DRAWN AEROBIC AND ANAEROBIC Blood Culture adequate volume   Culture   Final    NO GROWTH 3 DAYS Performed at [facility], [place] [address], [place], [place] [address]    Report Status PENDING  Incomplete  Resp Panel by RT-PCR (Flu A&B, Covid) Nasopharyngeal Swab     Status: None   Collection Time: 12/19/20 12:21 PM   Specimen: Nasopharyngeal Swab; Nasopharyngeal(NP) swabs in vial transport medium  Result Value Ref Range Status   SARS Coronavirus 2 by RT PCR NEGATIVE NEGATIVE Final    Comment: (NOTE) SARS-CoV-2 target nucleic acids are NOT DETECTED.  The SARS-CoV-2 RNA is  generally detectable in upper respiratory specimens during the acute phase of infection. The lowest concentration of SARS-CoV-2 viral copies this assay can detect is 138 copies/mL. A negative result does not preclude SARS-Cov-2 infection and should not be used as the sole basis for treatment or other patient management decisions. A negative result may occur with  improper specimen collection/handling, submission of specimen other than nasopharyngeal swab, presence of viral mutation(s) within the areas targeted by this assay, and inadequate number of viral copies(<138 copies/mL). A negative result must be combined with clinical observations, patient history, and epidemiological information. The expected result is Negative.  Fact Sheet for Patients:  EntrepreneurPulse.com.au  Fact Sheet for Healthcare Providers:  IncredibleEmployment.be  This test is no t yet approved or cleared by the [place] FDA and  has been authorized for detection and/or diagnosis of SARS-CoV-2 by FDA under an Emergency Use Authorization (EUA). This EUA will remain  in effect (meaning this test can be used) for the duration of the COVID-19 declaration under Section 564(b)(1) of the Act, 21 U.S.C.section 360bbb-3(b)(1), unless the authorization is terminated  or revoked sooner.       Influenza A by PCR NEGATIVE NEGATIVE Final   Influenza B by PCR NEGATIVE NEGATIVE Final    Comment: (NOTE) The Xpert Xpress SARS-CoV-2/FLU/RSV plus assay is intended as an aid in the diagnosis of influenza from Nasopharyngeal swab specimens and should not be used as a sole basis for treatment. Nasal washings and aspirates are unacceptable for Xpert Xpress SARS-CoV-2/FLU/RSV testing.  Fact Sheet for Patients: EntrepreneurPulse.com.au  Fact Sheet for Healthcare Providers: IncredibleEmployment.be  This test is not yet approved or cleared by the [place] FDA and has been authorized for detection and/or diagnosis of SARS-CoV-2 by FDA under an Emergency Use Authorization (EUA). This EUA will remain in effect (meaning this test can be used) for the duration of the COVID-19 declaration under Section 564(b)(1) of the Act, 21 U.S.C. section 360bbb-3(b)(1), unless the authorization is terminated or revoked.  Performed at [facility], [address] [place],  [address]   Surgical pcr screen     Status: Abnormal   Collection Time: 12/21/20  10:30 AM  Result Value Ref Range Status   MRSA, PCR NEGATIVE NEGATIVE Final   Staphylococcus aureus POSITIVE (A) NEGATIVE Final    Comment: (NOTE) The Xpert SA Assay (FDA approved for NASAL specimens in patients 72 years of age and older), is one component of a comprehensive surveillance program. It is not intended to diagnose infection nor to guide or monitor treatment. Performed at [facility], [place] [address] [place], [place] [address]       Studies: PERIPHERAL VASCULAR CATHETERIZATION  Result Date: 12/20/2020 Patient name: Lance Dillon MRN: [id number] DOB: [birth year] Sex: male 12/20/2020 Pre-operative Diagnosis: Critical right lower extremity ischemia with fourth toe ulceration Post-operative diagnosis:  Same Surgeon:  Eda Paschal. Donzetta Matters, MD Procedure Performed: 1.  Ultrasound-guided cannulation left common femoral artery 2.  Aortogram 3.  Selection of right external leg artery and right lower extremity angiography 4.  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  He now has a fourth toe ulceration with depressed ABIs and has previous duplex demonstrating occluded bypass graft.  He is now indicated for angiography with possible intervention. Findings: Left common femoral artery appeared to be occluded I  was able to cannulate this just at the top prior to a collateral and get access.  Performed aortogram which demonstrated heavily calcified aorta and bilateral common and external iliac arteries.  I then crossed over to the common iliac artery on the right where I had collaterals to fill the right lower extremity.  I performed angiography was demonstrated occluded right common femoral with long segment occlusion of his SFA which was heavily calcified reconstitutes at the level of the knee where it is diseased below the knee is much less diseased appears to have runoff via the anterior tibial and peroneal arteries. Plan will be for right common femoral endarterectomy and femoral to below-knee popliteal artery bypass which will likely need to be performed with graft given the vein has been harvested in the past for bypass.  Procedure:  The patient was identified in the holding area and taken to room 8.  The patient was then placed supine on the table and prepped and draped in the usual sterile fashion.  A time out was called.  Ultrasound was used to evaluate the left common femoral artery.  This was heavily calcified.  I did find an area cephalad that was apparently patent.  I anesthetized the area and cannulated with micropuncture needle followed by wire and sheath.  I placed a Bentson wire followed by a 5 [place] sheath.  I placed an Omni catheter to the level of L1 and performed aortogram.  I then crossed the bifurcation with an Omni catheter and Bentson wire placed the catheter into the external neck artery on the right and perform right lower extremity angiography with the above findings.  I elected for no intervention.  The catheter was removed over wire.  Sheath will be pulled in postoperative holding.  He tolerated procedure without any complication. Contrast: 80 cc Brandon C. Donzetta Matters, MD [facility] [place] Office: [phone] Pager: [phone]      Flora Lipps, MD  [facility] 12/22/2020  If 7PM-7AM, please contact night-coverage

## 2020-12-23 LAB — CBC
HCT: 26.5 % — ABNORMAL LOW (ref 39.0–52.0)
Hemoglobin: 8.8 g/dL — ABNORMAL LOW (ref 13.0–17.0)
MCH: 31 pg (ref 26.0–34.0)
MCHC: 33.2 g/dL (ref 30.0–36.0)
MCV: 93.3 fL (ref 80.0–100.0)
Platelets: 125 10*3/uL — ABNORMAL LOW (ref 150–400)
RBC: 2.84 MIL/uL — ABNORMAL LOW (ref 4.22–5.81)
RDW: 13.6 % (ref 11.5–15.5)
WBC: 9.4 10*3/uL (ref 4.0–10.5)
nRBC: 0 % (ref 0.0–0.2)

## 2020-12-23 LAB — BASIC METABOLIC PANEL
Anion gap: 5 (ref 5–15)
BUN: 19 mg/dL (ref 8–23)
CO2: 23 mmol/L (ref 22–32)
Calcium: 7.8 mg/dL — ABNORMAL LOW (ref 8.9–10.3)
Chloride: 106 mmol/L (ref 98–111)
Creatinine, Ser: 1.17 mg/dL (ref 0.61–1.24)
GFR, Estimated: >60 mL/min (ref >60)
Glucose, Bld: 119 mg/dL — ABNORMAL HIGH (ref 70–99)
Potassium: 3.9 mmol/L (ref 3.5–5.1)
Sodium: 134 mmol/L — ABNORMAL LOW (ref 135–145)

## 2020-12-23 NOTE — Plan of Care (Signed)
  Problem: Clinical Measurements: Goal: Will remain free from infection Outcome: Progressing   Problem: Nutrition: Goal: Adequate nutrition will be maintained Outcome: Progressing   Problem: Elimination: Goal: Will not experience complications related to bowel motility Outcome: Progressing Goal: Will not experience complications related to urinary retention Outcome: Progressing   

## 2020-12-23 NOTE — Progress Notes (Addendum)
  Progress Note    12/23/2020 10:14 AM 2 Days Post-Op  Subjective:  No complaints this morning   Vitals:   12/23/20 0816 12/23/20 0857  BP: 108/66   Pulse: (!) 101 89  Resp: 20 18  Temp: 98.1 F (36.7 C)   SpO2: 93% 95%   Physical Exam: Lungs:  non labored Incisions:  R groin and popliteal incisions c/d/i Extremities:  brisk DP and PT by doppler Neurologic: A&O  CBC    Component Value Date/Time   WBC 9.4 12/23/2020 0112   RBC 2.84 (L) 12/23/2020 0112   HGB 8.8 (L) 12/23/2020 0112   HCT 26.5 (L) 12/23/2020 0112   PLT 125 (L) 12/23/2020 0112   MCV 93.3 12/23/2020 0112   MCH 31.0 12/23/2020 0112   MCHC 33.2 12/23/2020 0112   RDW 13.6 12/23/2020 0112   LYMPHSABS 1.6 12/19/2020 1128   MONOABS 0.8 12/19/2020 1128   EOSABS 0.2 12/19/2020 1128   BASOSABS 0.1 12/19/2020 1128    BMET    Component Value Date/Time   NA 134 (L) 12/23/2020 0112   K 3.9 12/23/2020 0112   CL 106 12/23/2020 0112   CO2 23 12/23/2020 0112   GLUCOSE 119 (H) 12/23/2020 0112   BUN 19 12/23/2020 0112   CREATININE 1.17 12/23/2020 0112   CALCIUM 7.8 (L) 12/23/2020 0112   GFRNONAA >60 12/23/2020 0112   GFRAA 51 (L) 11/25/2015 1210    INR    Component Value Date/Time   INR 1.00 04/27/2015 0655     Intake/Output Summary (Last 24 hours) at 12/23/2020 1014 Last data filed at 12/23/2020 0824 Gross per 24 hour  Intake 210 ml  Output 3150 ml  Net -2940 ml     Assessment/Plan:  80 y.o. male is s/p redo R fem pop with PTFE 2 Days Post-Op   RLE well perfused by doppler exam Continue current wound care to 4th and 5th toes Ok for d/c home tomorrow on p.o. antibiotics if [facility] PT arranged Office will arrange outpatient follow up   Dagoberto Ligas, PA-C [facility] [phone] 12/23/2020 10:14 AM  VASCULAR STAFF ADDENDUM: I have independently interviewed and examined the patient. I agree with the above.  Pt progressing appropriately.  Excellent signals in the foot.  Will allow the toes to demarcate further.  Cassandria Santee, MD [facility] Phone Number: [phone] 12/23/2020 10:30 AM

## 2020-12-23 NOTE — Progress Notes (Signed)
PROGRESS NOTE  Lance Dillon JQB:[id number] DOB: Sep 08, 1940 DOA: 12/19/2020 PCP: Deland Pretty, MD   LOS: 4 days   Brief narrative:  Lance Dillon is a 80 y.o. male with past medical history of peripheral artery disease, renal artery stenosis, carotid stenosis, cerebral aneurysm, COPD, CKD stage II, history of prostate cancer status post prostatectomy, hypertension and hyperlipidemia presented to hospital with ulceration in his right foot for the last 2 weeks.  Patient had been following up with podiatry but had been having worsening pain in spreading erythema.  Patient does have history of right external iliac profunda femoris endarterectomy and right femoral to popliteal bypass in the past.  In the ED, patient was afebrile, normotensive.  No leukocytosis.  Foot x-ray was unremarkable.  Patient was seen by Dr. Stanford Breed, with vascular surgery who had for lower extremity angiogram with possible intervention on 12/20/2020.  During hospitalization, patient underwent angiogram followed by surgical intervention on 12/21/2020.  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   History of right femoral-popliteal bypass.   On  IV vancomycin and cefepime.  Vascular surgery on board and patient underwent angiogram and subsequently underwent Right common femoral and profundofemoral endarterectomy with right common femoral to below-knee popliteal artery bypass on 12/21/2020. Marland Kitchen  Vascular surgery recommends continuation of IV antibiotics at this time.  Possible transition to oral antibiotic tomorrow and plan for discharge.  Possible transition to oral antibiotic tomorrow.  Physical therapy has recommended home PT  on discharge.  CKD stage II with previous history of left renal artery stenosis. Creatinine at 1.2.  We will continue to monitor closely.   Essential HTN  Continue as needed hydralazine for now.  Continue metoprolol.  Patient is on losartan hydrochlorothiazide and amlodipine at home which is on hold at this time.   COPD Did not appear to be in exacerbation.  Continue inhalers.   History of cerebral aneurysm -Asymptomatic.  Unchanged on recent MRA head in August  Disposition. Likely home with home health tomorrow, IV antibiotic for 1 more day.  Transition to p.o. in a.m.  DVT prophylaxis: heparin injection 5,000 Units Start: 12/22/20 0600 SCD's Start: 12/21/20 1724   Code Status: Full code  Family Communication:  I spoke with the patient's wife at bedside.  Status is: Inpatient  Remains inpatient appropriate because: Status post vascular surgery, IV antibiotics  Consultants: Vascular surgery  Procedures: Angiogram on 12/20/20 Right common femoral and profundofemoral endarterectomy with right common femoral to below-knee popliteal artery bypass on 12/21/2020  Anti-infectives:  Vancomycin and  cefepime   Anti-infectives (From admission, onward)    Start     Dose/Rate Route Frequency Ordered Stop   12/20/20 0800  vancomycin (VANCOREADY) IVPB 1250 mg/250 mL        1,250 mg 166.7 mL/hr over 90 Minutes Intravenous Every 24 hours 12/19/20 1413     12/20/20 0000  ceFEPIme (MAXIPIME) 2 g in sodium chloride 0.9 % 100 mL IVPB        2 g 200 mL/hr over 30 Minutes Intravenous Every 12 hours 12/19/20 1413     12/19/20 1145  vancomycin (VANCOCIN) IVPB 1000 mg/200 mL premix        1,000 mg 200 mL/hr over 60  Minutes Intravenous  Once 12/19/20 1132 12/19/20 1350   12/19/20 1145  ceFEPIme (MAXIPIME) 2 g in sodium chloride 0.9 % 100 mL IVPB        2 g 200 mL/hr over 30 Minutes Intravenous  Once 12/19/20 1132 12/19/20 1223   12/19/20 1145  metroNIDAZOLE (FLAGYL) IVPB 500 mg   Status:  Discontinued        500 mg 100 mL/hr over 60 Minutes Intravenous Every 12 hours 12/19/20 1132 12/19/20 2144      Subjective: Today, patient was seen and examined at bedside.  Patient complains of mild dizziness.  Had some low blood pressure overnight.  Denies any chest pain, shortness of breath,  Objective: Vitals:   12/23/20 0816 12/23/20 0857  BP: 108/66   Pulse: (!) 101 89  Resp: 20 18  Temp: 98.1 F (36.7 C)   SpO2: 93% 95%    Intake/Output Summary (Last 24 hours) at 12/23/2020 1120 Last data filed at 12/23/2020 0824 Gross per 24 hour  Intake 210 ml  Output 3150 ml  Net -2940 ml    Filed Weights   12/19/20 1809 12/19/20 2132 12/21/20 0352  Weight: (S) 71.2 kg 71.2 kg 73 kg   Body mass index is 24.47 kg/m.   Physical Exam: General:  Average built, not in obvious distress HENT:   No scleral pallor or icterus noted. Oral mucosa is moist.  Chest:  Clear breath sounds.  Diminished breath sounds bilaterally. No crackles or wheezes.  CVS: S1 &S2 heard. No murmur.  Regular rate and rhythm. Abdomen: Soft, nontender, nondistended.  Bowel sounds are heard.   Extremities: No cyanosis, clubbing or edema.  Peripheral pulses are palpable.  Right foot with Ace bandage.  Status post incision from vascular surgery.  Distal capillary refill present. Psych: Alert, awake and oriented, normal mood CNS:  No cranial nerve deficits.  Power equal in all extremities.   Skin: Warm and dry.  Incision over the right lower extremity from bypass surgery     Data Review: I have personally reviewed the following labs and imaging studies.  CBC: Recent Labs  Lab 12/19/20 1128 12/20/20 0253 12/21/20 0254 12/21/20 1442 12/22/20 0121 12/23/20 0112  WBC 10.2 7.0 7.0  --  10.1 9.4  NEUTROABS 7.5  --   --   --   --   --   HGB 13.4 11.6* 11.0* 8.5* 9.7* 8.8*  HCT 39.4 34.3* 32.7* 25.0* 28.6* 26.5*  MCV 94.3 95.3 95.3  --  92.3 93.3  PLT 194 153 154  --  121* 125*    Basic  Metabolic Panel: Recent Labs  Lab 12/19/20 1128 12/20/20 0253 12/21/20 0254 12/21/20 1442 12/22/20 0121 12/23/20 0112  NA 137 133* 133* 136 132* 134*  K 3.7 3.5 4.1 4.2 4.4 3.9  CL 98 101 102  --  105 106  CO2 31 25 24   --  17* 23  GLUCOSE 99 112* 113*  --  237* 119*  BUN 30* 25* 18  --  20 19  CREATININE 1.21 1.27* 1.16  --  1.27* 1.17  CALCIUM 9.7 8.2* 8.3*  --  7.6* 7.8*    Liver Function Tests: Recent Labs  Lab 12/19/20 1128  AST 16  ALT 15  ALKPHOS 68  BILITOT 1.3*  PROT 7.0  ALBUMIN 4.1    No results for input(s): LIPASE, AMYLASE in the last 168 hours. No results for input(s): AMMONIA in the last 168 hours. Cardiac Enzymes: No results for input(s): CKTOTAL, CKMB,  CKMBINDEX, TROPONINI in the last 168 hours. BNP (last 3 results) No results for input(s): BNP in the last 8760 hours.  ProBNP (last 3 results) No results for input(s): PROBNP in the last 8760 hours.  CBG: No results for input(s): GLUCAP in the last 168 hours. Recent Results (from the past 240 hour(s))  Blood culture (routine x 2)     Status: None (Preliminary result)   Collection Time: 12/19/20 10:58 AM   Specimen: BLOOD  Result Value Ref Range Status   Specimen Description BLOOD RIGHT ANTECUBITAL  Final   Special Requests   Final    BOTTLES DRAWN AEROBIC AND ANAEROBIC Blood Culture adequate volume   Culture   Final    NO GROWTH 3 DAYS Performed at [facility], [address] [place], [place] [address]    Report Status PENDING  Incomplete  Blood culture (routine x 2)     Status: None (Preliminary result)   Collection Time: 12/19/20 11:03 AM   Specimen: BLOOD  Result Value Ref Range Status   Specimen Description BLOOD LEFT ANTECUBITAL  Final   Special Requests   Final    BOTTLES DRAWN AEROBIC AND ANAEROBIC Blood Culture adequate volume   Culture   Final    NO GROWTH 3 DAYS Performed at [facility], [place] [address] [place], [place] [address]    Report Status PENDING   Incomplete  Resp Panel by RT-PCR (Flu A&B, Covid) Nasopharyngeal Swab     Status: None   Collection Time: 12/19/20 12:21 PM   Specimen: Nasopharyngeal Swab; Nasopharyngeal(NP) swabs in vial transport medium  Result Value Ref Range Status   SARS Coronavirus 2 by RT PCR NEGATIVE NEGATIVE Final    Comment: (NOTE) SARS-CoV-2 target nucleic acids are NOT DETECTED.  The SARS-CoV-2 RNA is generally detectable in upper respiratory specimens during the acute phase of infection. The lowest concentration of SARS-CoV-2 viral copies this assay can detect is 138 copies/mL. A negative result does not preclude SARS-Cov-2 infection and should not be used as the sole basis for treatment or other patient management decisions. A negative result may occur with  improper specimen collection/handling, submission of specimen other than nasopharyngeal swab, presence of viral mutation(s) within the areas targeted by this assay, and inadequate number of viral copies(<138 copies/mL). A negative result must be combined with clinical observations, patient history, and epidemiological information. The expected result is Negative.  Fact Sheet for Patients:  EntrepreneurPulse.com.au  Fact Sheet for Healthcare Providers:  IncredibleEmployment.be  This test is no t yet approved or cleared by the [place] FDA and  has been authorized for detection and/or diagnosis of SARS-CoV-2 by FDA under an Emergency Use Authorization (EUA). This EUA will remain  in effect (meaning this test can be used) for the duration of the COVID-19 declaration under Section 564(b)(1) of the Act, 21 U.S.C.section 360bbb-3(b)(1), unless the authorization is terminated  or revoked sooner.       Influenza A by PCR NEGATIVE NEGATIVE Final   Influenza B by PCR NEGATIVE NEGATIVE Final    Comment: (NOTE) The Xpert Xpress SARS-CoV-2/FLU/RSV plus assay is intended as an aid in the diagnosis of influenza  from Nasopharyngeal swab specimens and should not be used as a sole basis for treatment. Nasal washings and aspirates are unacceptable for Xpert Xpress SARS-CoV-2/FLU/RSV testing.  Fact Sheet for Patients: EntrepreneurPulse.com.au  Fact Sheet for Healthcare Providers: IncredibleEmployment.be  This test is not yet approved or cleared by the [place] FDA and has  been authorized for detection and/or diagnosis of SARS-CoV-2 by FDA under an Emergency Use Authorization (EUA). This EUA will remain in effect (meaning this test can be used) for the duration of the COVID-19 declaration under Section 564(b)(1) of the Act, 21 U.S.C. section 360bbb-3(b)(1), unless the authorization is terminated or revoked.  Performed at [facility], [address], [place], [place] [address]   Surgical pcr screen     Status: Abnormal   Collection Time: 12/21/20 10:30 AM  Result Value Ref Range Status   MRSA, PCR NEGATIVE NEGATIVE Final   Staphylococcus aureus POSITIVE (A) NEGATIVE Final    Comment: (NOTE) The Xpert SA Assay (FDA approved for NASAL specimens in patients 12 years of age and older), is one component of a comprehensive surveillance program. It is not intended to diagnose infection nor to guide or monitor treatment. Performed at [facility], [place] [address], [place], [place] [address]      Studies: No results found.    Flora Lipps, MD  [facility] 12/23/2020  If 7PM-7AM, please contact night-coverage

## 2020-12-24 LAB — BASIC METABOLIC PANEL
Anion gap: 7 (ref 5–15)
BUN: 17 mg/dL (ref 8–23)
CO2: 24 mmol/L (ref 22–32)
Calcium: 7.8 mg/dL — ABNORMAL LOW (ref 8.9–10.3)
Chloride: 102 mmol/L (ref 98–111)
Creatinine, Ser: 1.26 mg/dL — ABNORMAL HIGH (ref 0.61–1.24)
GFR, Estimated: 58 mL/min — ABNORMAL LOW (ref >60)
Glucose, Bld: 109 mg/dL — ABNORMAL HIGH (ref 70–99)
Potassium: 4.2 mmol/L (ref 3.5–5.1)
Sodium: 133 mmol/L — ABNORMAL LOW (ref 135–145)

## 2020-12-24 LAB — CULTURE, BLOOD (ROUTINE X 2)
Culture: NO GROWTH
Culture: NO GROWTH
Special Requests: ADEQUATE
Special Requests: ADEQUATE

## 2020-12-24 LAB — CBC
HCT: 27.5 % — ABNORMAL LOW (ref 39.0–52.0)
Hemoglobin: 9 g/dL — ABNORMAL LOW (ref 13.0–17.0)
MCH: 31 pg (ref 26.0–34.0)
MCHC: 32.7 g/dL (ref 30.0–36.0)
MCV: 94.8 fL (ref 80.0–100.0)
Platelets: 143 10*3/uL — ABNORMAL LOW (ref 150–400)
RBC: 2.9 MIL/uL — ABNORMAL LOW (ref 4.22–5.81)
RDW: 13.6 % (ref 11.5–15.5)
WBC: 7.5 10*3/uL (ref 4.0–10.5)
nRBC: 0 % (ref 0.0–0.2)

## 2020-12-24 LAB — MAGNESIUM: Magnesium: 1.9 mg/dL (ref 1.7–2.4)

## 2020-12-24 MED ORDER — CEFDINIR 300 MG PO CAPS
600.0000 mg | ORAL_CAPSULE | Freq: Every day | ORAL | Status: DC
  Administered 2020-12-24 – 2020-12-25 (×2): 600 mg via ORAL
  Filled 2020-12-24 (×3): qty 2

## 2020-12-24 MED ORDER — DOXYCYCLINE HYCLATE 100 MG PO TABS
100.0000 mg | ORAL_TABLET | Freq: Two times a day (BID) | ORAL | Status: DC
  Administered 2020-12-24 – 2020-12-25 (×3): 100 mg via ORAL
  Filled 2020-12-24 (×3): qty 1

## 2020-12-24 NOTE — Progress Notes (Addendum)
  Progress Note    12/24/2020 7:52 AM 3 Days Post-Op  Subjective:  says he has some pain in the toe.  C/o pain in the lateral thigh on the right  Afebrile HR 90's-100's  244'Q-286'N systolic 81% RA  Vitals:   12/23/20 2333 12/24/20 0328  BP: 115/63 115/62  Pulse: 83 85  Resp: 20 19  Temp: 98 F (36.7 C) 98 F (36.7 C)  SpO2: 98% 98%    Physical Exam: General:  no distress Lungs:  non labored Incisions:  right groin and right BK incisions are healing nicely.  Extremities:  brisk right PT doppler signal; ischemic right 4th toe.     CBC    Component Value Date/Time   WBC 7.5 12/24/2020 0206   RBC 2.90 (L) 12/24/2020 0206   HGB 9.0 (L) 12/24/2020 0206   HCT 27.5 (L) 12/24/2020 0206   PLT 143 (L) 12/24/2020 0206   MCV 94.8 12/24/2020 0206   MCH 31.0 12/24/2020 0206   MCHC 32.7 12/24/2020 0206   RDW 13.6 12/24/2020 0206   LYMPHSABS 1.6 12/19/2020 1128   MONOABS 0.8 12/19/2020 1128   EOSABS 0.2 12/19/2020 1128   BASOSABS 0.1 12/19/2020 1128    BMET    Component Value Date/Time   NA 133 (L) 12/24/2020 0206   K 4.2 12/24/2020 0206   CL 102 12/24/2020 0206   CO2 24 12/24/2020 0206   GLUCOSE 109 (H) 12/24/2020 0206   BUN 17 12/24/2020 0206   CREATININE 1.26 (H) 12/24/2020 0206   CALCIUM 7.8 (L) 12/24/2020 0206   GFRNONAA 58 (L) 12/24/2020 0206   GFRAA 51 (L) 11/25/2015 1210    INR    Component Value Date/Time   INR 1.00 04/27/2015 0655     Intake/Output Summary (Last 24 hours) at 12/24/2020 0752 Last data filed at 12/24/2020 0534 Gross per 24 hour  Intake 1080 ml  Output 3650 ml  Net -2570 ml     Assessment/Plan:  80 y.o. male is s/p:  redo R fem pop with PTFE  3 Days Post-Op   -pt with brisk doppler signals right foot -right 4th toe will need to demarcate-dry dressing bw toes.  Erythema on dorsum of foot much improved.   -oob and ambulate.  Work with PT.  -probably home tomorrow on broad spectrum po abx.   -f/u with [facility] in a couple of  weeks-our office will make appt.   -discussed with pt to keep right groin incision dry unless in the shower. Otherwise dry. -DVT prophylaxis:  sq heparin   Leontine Locket, PA-C [facility] [phone] 12/24/2020 7:52 AM   VASCULAR STAFF ADDENDUM: I have independently interviewed and examined the patient. I agree with the above.    Cassandria Santee, MD [facility] Phone Number: [phone] 12/24/2020 8:39 AM

## 2020-12-24 NOTE — Progress Notes (Signed)
Mobility Specialist Progress Note    12/24/20 1451  Mobility  Activity Ambulated in hall  Level of Assistance Modified independent, requires aide device or extra time  Assistive Device Front wheel walker  Distance Ambulated (ft) 55 ft  Mobility Ambulated independently in hallway  Mobility Response Tolerated well  Mobility performed by Mobility specialist  $Mobility charge 1 Mobility   Pre-Mobility: 93 HR  Pt received in bed and agreeable. C/o toe pain. Returned to sitting EOB with wife present.   [facility] Mobility Specialist  M.S. Primary Phone: [phone] M.S. Secondary Phone: [phone]

## 2020-12-24 NOTE — Progress Notes (Signed)
Physical Therapy Treatment Patient Details Name: Lance Dillon MRN: [id number] DOB: 06/16/1940 Today's Date: 12/24/2020   History of Present Illness Pt is an 80 y.o. male admitted on 12/19/20 with an infected R foot ulcer. S/p LE angiogram on 11/21 and femoral to below knee popliteal bypass 11/22. 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    PT Comments    Pt motivated and was able to ambulate in room.  Further distance limited due to pain - recommend premedicating for pain for improved tolerance (notified RN and mobility specialist).   Recommendations for follow up therapy are one component of a multi-disciplinary discharge planning process, led by the attending physician.  Recommendations may be updated based on patient status, additional functional criteria and insurance authorization.  Follow Up Recommendations  Home health PT     Assistance Recommended at Discharge PRN  Equipment Recommendations  Other (comment) (possible RW - clarify with pt)    Recommendations for Other Services       Precautions / Restrictions Precautions Precautions: Fall     Mobility  Bed Mobility Overal bed mobility: Modified Independent             General bed mobility comments: Supine sit with increased time but independently.  Discussed he could do this on his own in room (but no further without assist)    Transfers Overall transfer level: Needs assistance Equipment used: Rolling walker (2 wheels) Transfers: Sit to/from Stand Sit to Stand: Min guard           General transfer comment: Increased time with cues for hand placment and heavily reliant on UE    Ambulation/Gait Ambulation/Gait assistance: Min guard Gait Distance (Feet): 25 Feet Assistive device: Rolling walker (2 wheels) Gait Pattern/deviations: Step-through pattern;Decreased weight shift  to right;Decreased stride length Gait velocity: decreased     General Gait Details: Limited due to pain; educated on RW and lowered RW for improved use of UE; discussed would premedicate before next mobility sesion   Marine scientist [place] (Stroke Patients Only)       Balance Overall balance assessment: Needs assistance Sitting-balance support: No upper extremity supported;Feet supported Sitting balance-Leahy Scale: Normal     Standing balance support: Bilateral upper extremity supported;Reliant on assistive device for balance Standing balance-Leahy Scale: Poor Standing balance comment: reliant on UE support to offload painful RLE and maintain balance during static standing and ambulation                            Cognition Arousal/Alertness: Awake/alert Behavior During Therapy: WFL for tasks assessed/performed Overall Cognitive Status: Within Functional Limits for tasks assessed                                 General Comments: Very pleasant and motivated despite pain        Exercises General Exercises - Lower Extremity Ankle Circles/Pumps: AROM;Right;Supine Quad Sets: AROM;Right;Supine Heel Slides: AROM;Right;Supine Hip ABduction/ADduction: AROM;Right Straight Leg Raises: Supine Other Exercises Other Exercises: Only 1-3 reps for all due to pain - educating pt on exercises he could perform when pain better controlled    General Comments General comments (skin integrity, edema, etc.): VSS  Pertinent Vitals/Pain Pain Assessment: 0-10 Pain Score: 8  (8/10 mobility but back to 3/10 rest) Pain Location: R LE with mobility Pain Descriptors / Indicators: Burning;Sharp Pain Intervention(s): Limited activity within patient's tolerance;Monitored during session;Repositioned (recommended premedication before mobility (notified RN and mobility specialist))    Home Living                           Prior Function            PT Goals (current goals can now be found in the care plan section) Progress towards PT goals: Progressing toward goals    Frequency    Min 3X/week      PT Plan Current plan remains appropriate    Co-evaluation              AM-PAC PT "6 Clicks" Mobility   Outcome Measure  Help needed turning from your back to your side while in a flat bed without using bedrails?: None Help needed moving from lying on your back to sitting on the side of a flat bed without using bedrails?: None Help needed moving to and from a bed to a chair (including a wheelchair)?: A Little Help needed standing up from a chair using your arms (e.g., wheelchair or bedside chair)?: A Little Help needed to walk in hospital room?: A Little Help needed climbing 3-5 steps with a railing? : A Little 6 Click Score: 20    End of Session Equipment Utilized During Treatment: Gait belt Activity Tolerance: Patient limited by pain Patient left: in bed;with bed alarm set;with family/visitor present;with call bell/phone within reach Nurse Communication: Mobility status PT Visit Diagnosis: Unsteadiness on feet (R26.81);Other abnormalities of gait and mobility (R26.89);Difficulty in walking, not elsewhere classified (R26.2);Muscle weakness (generalized) (M62.81);Pain     Time: 4332-9518 PT Time Calculation (min) (ACUTE ONLY): 13 min  Charges:  $Gait Training: 8-22 mins                     Abran Richard, PT Acute Rehab Services Pager [phone] [facility] 12/24/2020, 10:10 AM

## 2020-12-24 NOTE — Progress Notes (Signed)
Occupational Therapy Treatment Patient Details Name: Lance Dillon MRN: [id number] DOB: March 24, 1940 Today's Date: 12/24/2020   History of present illness Pt is an 80 y.o. male admitted on 12/19/20 with an infected R foot ulcer. S/p LE angiogram on 11/21 and femoral to below knee popliteal bypass 11/22. 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   OT comments  Pt eager to work with OT.  He continues with pain Rt LE that limits his activity.  He is able to complete ADLs with min guard assist with increased time.  He verbalizes understanding of how to use tub transfer bench for tub transfer.   Will continue to follow.    Recommendations for follow up therapy are one component of a multi-disciplinary discharge planning process, led by the attending physician.  Recommendations may be updated based on patient status, additional functional criteria and insurance authorization.    Follow Up Recommendations  Home health OT    Assistance Recommended at Discharge Intermittent Supervision/Assistance  Equipment Recommendations  None recommended by OT    Recommendations for Other Services      Precautions / Restrictions Precautions Precautions: Fall Restrictions Weight Bearing Restrictions: No       Mobility Bed Mobility Overal bed mobility: Modified Independent             General bed mobility comments: Supine sit with increased time but independently.  Discussed he could do this on his own in room (but no further without assist)    Transfers Overall transfer level: Needs assistance Equipment used: Rolling walker (2 wheels) Transfers: Sit to/from Stand Sit to Stand: Min guard           General transfer comment: Increased time with cues for hand placment and heavily reliant on UE     Balance Overall balance assessment: Needs  assistance Sitting-balance support: No upper extremity supported;Feet supported Sitting balance-Leahy Scale: Normal     Standing balance support: Bilateral upper extremity supported;Reliant on assistive device for balance Standing balance-Leahy Scale: Poor Standing balance comment: reliant on UE support to offload painful RLE and maintain balance during static standing and ambulation                           ADL either performed or assessed with clinical judgement   ADL Overall ADL's : Needs assistance/impaired     Grooming: Wash/dry hands;Brushing hair;Supervision/safety;Standing;Wash/dry face       Lower Body Bathing: Min guard;Sit to/from stand       Lower Body Dressing: Min guard;Sit to/from stand       Toileting- Water quality scientist and Hygiene: Sit to/from stand;Min guard       Functional mobility during ADLs: Min guard;Rolling walker (2 wheels)      Extremity/Trunk Assessment Upper Extremity Assessment Upper Extremity Assessment: Overall WFL for tasks assessed   Lower Extremity Assessment Lower Extremity Assessment: Defer to PT evaluation        Vision       Perception     Praxis      Cognition Arousal/Alertness: Awake/alert Behavior During Therapy: WFL for tasks assessed/performed Overall Cognitive Status: Within Functional Limits for tasks assessed                                 General Comments: Very pleasant and motivated despite pain  Exercises General Exercises - Lower Extremity Ankle Circles/Pumps: AROM;Right;Supine Quad Sets: AROM;Right;Supine Heel Slides: AROM;Right;Supine Hip ABduction/ADduction: AROM;Right Straight Leg Raises: Supine Other Exercises Other Exercises: Only 1-3 reps for all due to pain - educating pt on exercises he could perform when pain better controlled   Shoulder Instructions       General Comments VSS    Pertinent Vitals/ Pain       Pain Assessment: Faces Pain  Score: 8  (8/10 mobility but back to 3/10 rest) Faces Pain Scale: Hurts even more Pain Location: R LE with mobility Pain Descriptors / Indicators: Burning;Sharp Pain Intervention(s): Monitored during session;Limited activity within patient's tolerance  Home Living                                          Prior Functioning/Environment              Frequency  Min 2X/week        Progress Toward Goals  OT Goals(current goals can now be found in the care plan section)  Progress towards OT goals: Progressing toward goals     Plan Discharge plan remains appropriate;Equipment recommendations need to be updated    Co-evaluation                 AM-PAC OT "6 Clicks" Daily Activity     Outcome Measure   Help from another person eating meals?: None Help from another person taking care of personal grooming?: None Help from another person toileting, which includes using toliet, bedpan, or urinal?: A Little Help from another person bathing (including washing, rinsing, drying)?: A Little Help from another person to put on and taking off regular upper body clothing?: A Little Help from another person to put on and taking off regular lower body clothing?: A Little 6 Click Score: 20    End of Session Equipment Utilized During Treatment: Rolling walker (2 wheels)  OT Visit Diagnosis: Unsteadiness on feet (R26.81);Other abnormalities of gait and mobility (R26.89);Pain Pain - Right/Left: Right Pain - part of body: Leg   Activity Tolerance Patient limited by pain   Patient Left in bed;with call bell/phone within reach;with family/visitor present   Nurse Communication Mobility status        Time: 8938-1017 OT Time Calculation (min): 1340 min  Charges: OT General Charges $OT Visit: 1 Visit OT Treatments $Self Care/Home Management : 8-22 mins  Nilsa Nutting OTR/L Acute Rehabilitation Services Pager [phone] Office [phone]   Lucille Passy  M 12/24/2020, 12:06 PM

## 2020-12-24 NOTE — Progress Notes (Signed)
Patients IV malpositioned, new IV was placed. Cefepime and vancomycin were discontinued. MD notified me to give these two medications. Pharmacy states do not give medications. MD transitioned to oral meds to be given.  Daymon Larsen, RN

## 2020-12-24 NOTE — TOC Initial Note (Signed)
Transition of Care (TOC) - Initial/Assessment Note  Marvetta Gibbons RN, BSN Transitions of Care Unit 4E- RN Case Manager See Treatment Team for direct phone #    Patient Details  Name: Lance Dillon MRN: [id number] Date of Birth: April 05, 1940  Transition of Care [facility]) CM/SW Contact:    Dawayne Patricia, RN Phone Number: 12/24/2020, 12:20 PM  Clinical Narrative:                 Pt from home w/ wife, orders placed for HHPT/OT needs.  CM in to speak with pt and wife at the bedside. Per conversation pt reports he has rollator at home, however would like a RW for home as well. Explained Medicare rules regarding DME and coverage every 5 yrs. Depending on coverage for rollator - may or may not cover RW- pt and wife voiced understanding and would like to see if insurance would cover one. Will ask MD for DME order.   Also discussed [place] services, list provided for [facility] choice- Per CMS guidelines from medicare.gov website with star ratings (copy placed in shadow chart), after review of list- wife has selected [facility] as first choice, and if unable to accept- either [facility] or [facility] as alternates.   Address, phone #s and PCP all confirmed in epic.   Call made to [facility] with [facility] for [facility] referral- referral has been accepted, due to PT/OT staffing from the holiday- [facility] requesting RN to be added for start of care needs. HH order will be modified, MD aware and agreeable.   Call made to [facility]- for RW referral- Freda Munro to check on coverage and get with pt if insurance will not cover to see if pt wants to pay out of pocket for RW.   Expected Discharge Plan: [place] Barriers to Discharge: Continued Medical Work up   Patient Goals and CMS Choice Patient states their goals for this hospitalization and ongoing recovery are:: return home CMS Medicare.gov Compare Post Acute Care list provided to:: Patient Choice offered to / list presented to : Patient, Spouse  Expected  Discharge Plan and Services Expected Discharge Plan: [place]   Discharge Planning Services: CM Consult Post Acute Care Choice: Durable Medical Equipment, Home Health Living arrangements for the past 2 months: Single Family Home                 DME Arranged: Walker rolling DME Agency: [facility] Date DME Agency Contacted: 12/24/20 Time DME Agency Contacted: 1219 Representative spoke with at DME Agency: Freda Munro HH Arranged: PT, OT, RN [facility] Agency: [place] Date [place]: 12/24/20 Time [place]: 1219 Representative spoke with at [place]: Sharmon Revere  Prior Living Arrangements/Services Living arrangements for the past 2 months: [place] with:: Self, Spouse Patient language and need for interpreter reviewed:: Yes Do you feel safe going back to the place where you live?: Yes      Need for Family Participation in Patient Care: Yes (Comment) Care giver support system in place?: Yes (comment) Current home services: DME (rollator) Criminal Activity/Legal Involvement Pertinent to Current Situation/Hospitalization: No - Comment as needed  Activities of Daily Living Home Assistive Devices/Equipment: Eyeglasses ADL Screening (condition at time of admission) Patient's cognitive ability adequate to safely complete daily activities?: Yes Is the patient deaf or have difficulty hearing?: No Does the patient have difficulty seeing, even when wearing glasses/contacts?: No Does the patient have difficulty concentrating, remembering, or making decisions?:  No Patient able to express need for assistance with ADLs?: Yes Does the patient have difficulty dressing or bathing?: No Independently performs ADLs?: Yes (appropriate for developmental age) Does the patient have difficulty walking or climbing stairs?: Yes Weakness of Legs: Right Weakness of Arms/Hands: None  Permission Sought/Granted Permission sought to share  information with : Chartered certified accountant granted to share information with : Yes, Verbal Permission Granted     Permission granted to share info w AGENCY: HH/DME        Emotional Assessment Appearance:: Appears stated age Attitude/Demeanor/Rapport: Engaged Affect (typically observed): Pleasant, Appropriate, Accepting Orientation: : Oriented to Self, Oriented to Place, Oriented to  Time, Oriented to Situation Alcohol / Substance Use: Not Applicable Psych Involvement: No (comment)  Admission diagnosis:  Cellulitis of right lower extremity [L03.115] Atherosclerosis of bypass graft of right lower extremity with ulceration of other part of foot ([place]) [I70.335] Ischemic ulcer of left foot, unspecified ulcer stage ([place]) [L97.529] Patient Active Problem List   Diagnosis Date Noted   Ischemic ulcer of foot, right, with fat layer exposed ([place]) 12/19/2020   COPD (chronic obstructive pulmonary disease) ([place]) 12/19/2020   Posterior vitreous detachment of left eye 08/13/2019   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 08/13/2019   Retinal layer separation 08/13/2019   Primary open angle glaucoma of both eyes, severe stage 08/13/2019   Rhinitis, chronic 03/12/2018   Degeneration of lumbar intervertebral disc 08/28/2017   Lung field abnormal finding on examination 07/19/2017   COPD GOLD II with symptoms more typical of AB/cough variant asthma 07/19/2017   Carotid occlusion, 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   Dizziness 12/16/2013   Vertigo    Left renal artery stenosis - 70% by angiography; increased  velocity on recent Dopplers (60-99%) 12/08/2012    Class: Diagnosis of   Hyperlipidemia LDL goal <70 12/08/2012   Personal history of colonic adenomas 11/15/2012   Pain in limb 02/13/2012   Peripheral vascular disease ([place]) 08/07/2011   Atherosclerosis of native artery of extremity with intermittent claudication ([place]) 06/13/2011   Prostate cancer ([place]) 02/16/2011   Essential hypertension 12/09/2010   PCP:  Deland Pretty, MD Pharmacy:   [facility] [address], [place] [address] Phone: [phone] Fax: [phone]  [id number] [place], [place] [address] Phone: [phone] Fax: [phone]     Social Determinants of Health ([place]) Interventions    Readmission Risk Interventions Readmission Risk Prevention Plan 12/24/2020  Transportation Screening Complete  PCP or Specialist Appt within 5-7 Days Complete  Home Care Screening Complete  Medication Review (RN CM) Complete  Some recent data might be hidden

## 2020-12-24 NOTE — Progress Notes (Signed)
Pharmacy Antibiotic Note  Lance Dillon is a 80 y.o. male admitted on 12/19/2020 with critical right lower extremity ischemia with fourth toe ulceration s/p fem-pop bypass.  Pharmacy has been consulted for vancomycin and cefepime dosing.   D/w with Kateri Plummer from [facility] and we will transition him to PO doxy and cefdinir to complete a total of 10d of abx.    Plan: Dc vanc/cefepime Cefdinir 600mg  PO qday x4 Doxycycline 100mg  BID x4 days   Height: 5\' 8"  (172.7 cm) Weight: 76 kg (167 lb 8.8 oz) IBW/kg (Calculated) : 68.4  Temp (24hrs), Avg:98.3 F (36.8 C), Min:98 F (36.7 C), Max:98.7 F (37.1 C)  Recent Labs  Lab 12/19/20 1123 12/19/20 1128 12/20/20 0253 12/21/20 0254 12/22/20 0121 12/23/20 0112 12/24/20 0206  WBC  --    < > 7.0 7.0 10.1 9.4 7.5  CREATININE  --    < > 1.27* 1.16 1.27* 1.17 1.26*  LATICACIDVEN 1.2  --   --   --   --   --   --    < > = values in this interval not displayed.     Estimated Creatinine Clearance: 45.2 mL/min (A) (by C-G formula based on SCr of 1.26 mg/dL (H)).    Allergies  Allergen Reactions   Avelox [Moxifloxacin Hcl In Nacl] Diarrhea   Moxifloxacin Diarrhea   Cilostazol Diarrhea    Antimicrobials this admission: cefepime 11/20 >> 11/24 vancomycin 11/20 >> 11/24 metronidazole 11/20  Cefdinir 11/25>>11/28 Doxy 11/25>>11/28  Microbiology results: 11/22 MRSA + 11/20 Bcx NGTD   Onnie Boer, PharmD, BCIDP, AAHIVP, CPP Infectious Disease Pharmacist 12/24/2020 10:24 AM

## 2020-12-24 NOTE — Progress Notes (Signed)
PROGRESS NOTE  Lance Dillon:[id number] DOB: December 18, 1940 DOA: 12/19/2020 PCP: Deland Pretty, MD   LOS: 5 days   Brief narrative:  Lance Dillon is a 80 y.o. male with past medical history of peripheral artery disease, renal artery stenosis, carotid stenosis, cerebral aneurysm, COPD, CKD stage II, history of prostate cancer status post prostatectomy, hypertension and hyperlipidemia presented to hospital with ulceration in his right foot for the last 2 weeks.  Patient had been following up with podiatry but had been having worsening pain in spreading erythema.  Patient does have history of right external iliac profunda femoris endarterectomy and right femoral to popliteal bypass in the past.  In the ED, patient was afebrile, normotensive.  No leukocytosis.  Foot x-ray was unremarkable.  Patient was seen by Dr. Stanford Breed, with vascular surgery who had for lower extremity angiogram with possible intervention on 12/20/2020.  During hospitalization, patient underwent angiogram followed by surgical intervention on 12/21/2020.  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   History of right femoral-popliteal bypass.    On  IV vancomycin and cefepime.  Vascular surgery on board and patient underwent angiogram and subsequently underwent Right common femoral and profundofemoral endarterectomy with right common femoral to below-knee popliteal artery bypass on 12/21/2020.  Vascular surgery recommends continuation of IV antibiotics at this time.   Possible transition to oral antibiotic tomorrow.  Physical therapy has recommended home PT on discharge.  Home health has been ordered.  CKD stage II with  previous history of left renal artery stenosis. Creatinine at 1.2.  We will continue to monitor closely.   Essential HTN  Continue as needed hydralazine for now.  Continue metoprolol.  Patient is on losartan-hydrochlorothiazide and amlodipine at home which is on hold at this time.  Patient was not orthostatic yesterday.   COPD Did not appear to be in exacerbation.  Continue inhalers.   History of cerebral aneurysm -Asymptomatic.  Unchanged on recent MRA head in August  Disposition. Likely home with home health tomorrow, IV antibiotic for 1 more day.  Transition to p.o. in a.m.  DVT prophylaxis: heparin injection 5,000 Units Start: 12/22/20 0600 SCD's Start: 12/21/20 1724   Code Status: Full code  Family Communication:  I again spoke with the patient's wife at bedside.  Status is: Inpatient  Remains inpatient appropriate because: Status post vascular surgery, IV antibiotics  Consultants: Vascular surgery  Procedures: Angiogram on 12/20/20 Right common femoral and profundofemoral endarterectomy with right common femoral to below-knee popliteal artery bypass on 12/21/2020  Anti-infectives:  Vancomycin and  cefepime   Anti-infectives (From admission, onward)    Start     Dose/Rate Route Frequency Ordered Stop   12/20/20 0800  vancomycin (VANCOREADY) IVPB 1250 mg/250 mL        1,250 mg 166.7 mL/hr over 90 Minutes Intravenous Every 24 hours 12/19/20 1413     12/20/20 0000  ceFEPIme (MAXIPIME) 2 g in sodium chloride 0.9 % 100 mL IVPB        2 g 200 mL/hr over 30 Minutes Intravenous Every 12 hours 12/19/20 1413     12/19/20 1145  vancomycin (VANCOCIN) IVPB 1000 mg/200 mL premix        1,000 mg 200 mL/hr  over 60 Minutes Intravenous  Once 12/19/20 1132 12/19/20 1350   12/19/20 1145  ceFEPIme (MAXIPIME) 2 g in sodium chloride 0.9 % 100 mL IVPB        2 g 200 mL/hr over 30 Minutes Intravenous  Once 12/19/20 1132 12/19/20 1223   12/19/20 1145  metroNIDAZOLE (FLAGYL) IVPB 500  mg  Status:  Discontinued        500 mg 100 mL/hr over 60 Minutes Intravenous Every 12 hours 12/19/20 1132 12/19/20 2144      Subjective: Today, patient was seen and examined at bedside.  Complains of mild foot pain.  Wants to get out of bed and walk.    Objective: Vitals:   12/24/20 0328 12/24/20 0852  BP: 115/62   Pulse: 85   Resp: 19   Temp: 98 F (36.7 C)   SpO2: 98% 98%    Intake/Output Summary (Last 24 hours) at 12/24/2020 0854 Last data filed at 12/24/2020 0534 Gross per 24 hour  Intake 1080 ml  Output 2950 ml  Net -1870 ml    Filed Weights   12/19/20 2132 12/21/20 0352 12/24/20 0328  Weight: 71.2 kg 73 kg 76 kg   Body mass index is 25.48 kg/m.   Physical Exam: General:  Average built, not in obvious distress HENT:   No scleral pallor or icterus noted. Oral mucosa is moist.  Chest:  Clear breath sounds.  Diminished breath sounds bilaterally. No crackles or wheezes.  CVS: S1 &S2 heard. No murmur.  Regular rate and rhythm. Abdomen: Soft, nontender, nondistended.  Bowel sounds are heard.   Extremities: No cyanosis, clubbing or edema.  Peripheral pulses are palpable.  Right foot with dressing. Psych: Alert, awake and oriented, normal mood CNS:  No cranial nerve deficits.  Power equal in all extremities.   Skin: Warm and dry.  Right foot with incision from recent surgery.  Capillary refill present.  Bluish discoloration of the fourth toe.      Data Review: I have personally reviewed the following labs and imaging studies.  CBC: Recent Labs  Lab 12/19/20 1128 12/20/20 0253 12/21/20 0254 12/21/20 1442 12/22/20 0121 12/23/20 0112 12/24/20 0206  WBC 10.2 7.0 7.0  --  10.1 9.4 7.5  NEUTROABS 7.5  --   --   --   --   --   --   HGB 13.4 11.6* 11.0* 8.5* 9.7* 8.8* 9.0*  HCT 39.4 34.3* 32.7* 25.0* 28.6* 26.5* 27.5*  MCV 94.3 95.3 95.3  --  92.3 93.3 94.8  PLT 194 153 154  --  121* 125* 143*    Basic Metabolic Panel: Recent Labs  Lab 12/20/20 0253  12/21/20 0254 12/21/20 1442 12/22/20 0121 12/23/20 0112 12/24/20 0206  NA 133* 133* 136 132* 134* 133*  K 3.5 4.1 4.2 4.4 3.9 4.2  CL 101 102  --  105 106 102  CO2 25 24  --  17* 23 24  GLUCOSE 112* 113*  --  237* 119* 109*  BUN 25* 18  --  20 19 17   CREATININE 1.27* 1.16  --  1.27* 1.17 1.26*  CALCIUM 8.2* 8.3*  --  7.6* 7.8* 7.8*  MG  --   --   --   --   --  1.9    Liver Function Tests: Recent Labs  Lab 12/19/20 1128  AST 16  ALT 15  ALKPHOS 68  BILITOT 1.3*  PROT 7.0  ALBUMIN 4.1    No results for input(s): LIPASE, AMYLASE in the  last 168 hours. No results for input(s): AMMONIA in the last 168 hours. Cardiac Enzymes: No results for input(s): CKTOTAL, CKMB, CKMBINDEX, TROPONINI in the last 168 hours. BNP (last 3 results) No results for input(s): BNP in the last 8760 hours.  ProBNP (last 3 results) No results for input(s): PROBNP in the last 8760 hours.  CBG: No results for input(s): GLUCAP in the last 168 hours. Recent Results (from the past 240 hour(s))  Blood culture (routine x 2)     Status: None (Preliminary result)   Collection Time: 12/19/20 10:58 AM   Specimen: BLOOD  Result Value Ref Range Status   Specimen Description BLOOD RIGHT ANTECUBITAL  Final   Special Requests   Final    BOTTLES DRAWN AEROBIC AND ANAEROBIC Blood Culture adequate volume   Culture   Final    NO GROWTH 4 DAYS Performed at [facility], [address], [place], [place] [address]    Report Status PENDING  Incomplete  Blood culture (routine x 2)     Status: None (Preliminary result)   Collection Time: 12/19/20 11:03 AM   Specimen: BLOOD  Result Value Ref Range Status   Specimen Description BLOOD LEFT ANTECUBITAL  Final   Special Requests   Final    BOTTLES DRAWN AEROBIC AND ANAEROBIC Blood Culture adequate volume   Culture   Final    NO GROWTH 4 DAYS Performed at [facility], [place] [address] [place], [place] [address]    Report Status PENDING  Incomplete  Resp  Panel by RT-PCR (Flu A&B, Covid) Nasopharyngeal Swab     Status: None   Collection Time: 12/19/20 12:21 PM   Specimen: Nasopharyngeal Swab; Nasopharyngeal(NP) swabs in vial transport medium  Result Value Ref Range Status   SARS Coronavirus 2 by RT PCR NEGATIVE NEGATIVE Final    Comment: (NOTE) SARS-CoV-2 target nucleic acids are NOT DETECTED.  The SARS-CoV-2 RNA is generally detectable in upper respiratory specimens during the acute phase of infection. The lowest concentration of SARS-CoV-2 viral copies this assay can detect is 138 copies/mL. A negative result does not preclude SARS-Cov-2 infection and should not be used as the sole basis for treatment or other patient management decisions. A negative result may occur with  improper specimen collection/handling, submission of specimen other than nasopharyngeal swab, presence of viral mutation(s) within the areas targeted by this assay, and inadequate number of viral copies(<138 copies/mL). A negative result must be combined with clinical observations, patient history, and epidemiological information. The expected result is Negative.  Fact Sheet for Patients:  EntrepreneurPulse.com.au  Fact Sheet for Healthcare Providers:  IncredibleEmployment.be  This test is no t yet approved or cleared by the [place] FDA and  has been authorized for detection and/or diagnosis of SARS-CoV-2 by FDA under an Emergency Use Authorization (EUA). This EUA will remain  in effect (meaning this test can be used) for the duration of the COVID-19 declaration under Section 564(b)(1) of the Act, 21 U.S.C.section 360bbb-3(b)(1), unless the authorization is terminated  or revoked sooner.       Influenza A by PCR NEGATIVE NEGATIVE Final   Influenza B by PCR NEGATIVE NEGATIVE Final    Comment: (NOTE) The Xpert Xpress SARS-CoV-2/FLU/RSV plus assay is intended as an aid in the diagnosis of influenza from Nasopharyngeal  swab specimens and should not be used as a sole basis for treatment. Nasal washings and aspirates are unacceptable for Xpert Xpress SARS-CoV-2/FLU/RSV testing.  Fact Sheet for Patients: EntrepreneurPulse.com.au  Fact  Sheet for Healthcare Providers: IncredibleEmployment.be  This test is not yet approved or cleared by the [place] and has been authorized for detection and/or diagnosis of SARS-CoV-2 by FDA under an Emergency Use Authorization (EUA). This EUA will remain in effect (meaning this test can be used) for the duration of the COVID-19 declaration under Section 564(b)(1) of the Act, 21 U.S.C. section 360bbb-3(b)(1), unless the authorization is terminated or revoked.  Performed at [facility], [address], [place], [place] [address]   Surgical pcr screen     Status: Abnormal   Collection Time: 12/21/20 10:30 AM  Result Value Ref Range Status   MRSA, PCR NEGATIVE NEGATIVE Final   Staphylococcus aureus POSITIVE (A) NEGATIVE Final    Comment: (NOTE) The Xpert SA Assay (FDA approved for NASAL specimens in patients 43 years of age and older), is one component of a comprehensive surveillance program. It is not intended to diagnose infection nor to guide or monitor treatment. Performed at [facility], [place] [address], [place], [place] [address]      Studies: No results found.   Flora Lipps, MD  [facility] 12/24/2020  If 7PM-7AM, please contact night-coverage

## 2020-12-25 MED ORDER — DOCUSATE SODIUM 100 MG PO CAPS: 100.0000 mg | ORAL_CAPSULE | Freq: Every day | ORAL | 0 refills | Status: AC | PRN

## 2020-12-25 MED ORDER — HYDROCODONE-ACETAMINOPHEN 5-325 MG PO TABS: 1.0000 | ORAL_TABLET | Freq: Four times a day (QID) | ORAL | 0 refills | Status: DC | PRN

## 2020-12-25 MED ORDER — CEFDINIR 300 MG PO CAPS: 600.0000 mg | ORAL_CAPSULE | Freq: Every day | ORAL | 0 refills | Status: DC

## 2020-12-25 MED ORDER — DOXYCYCLINE HYCLATE 100 MG PO TABS: 100.0000 mg | ORAL_TABLET | Freq: Two times a day (BID) | ORAL | 0 refills | Status: DC

## 2020-12-25 NOTE — Discharge Summary (Signed)
Physician Discharge Summary  Lance Dillon LNL:[id number] DOB: December 24, 1940 DOA: 12/19/2020  PCP: Deland Pretty, MD  Admit date: 12/19/2020 Discharge date: 12/25/2020  Admitted From: Home  Discharge disposition: Home with home health  Recommendations for Outpatient Follow-Up:   Follow up with your primary care provider in one week.  Check CBC, BMP, magnesium in the next visit Follow-up with vascular surgery as has been scheduled by the clinic.  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  Diet recommendation: Low sodium, heart healthy.    Wound care: Incision site care  Code status: Full.  History of Present Illness:   Lance Dillon is a 80 y.o. male with past medical history of peripheral artery disease, renal artery stenosis, carotid stenosis, cerebral aneurysm, COPD, CKD stage II, history of prostate cancer status post prostatectomy, hypertension and hyperlipidemia presented to hospital with ulceration in his right foot for the last 2 weeks.  Patient had been following up with podiatry but had been having worsening pain in spreading erythema.  Patient does have history of right external iliac profunda femoris endarterectomy and right femoral to popliteal bypass in the past.  In the ED, patient was afebrile, normotensive.  No leukocytosis.  Foot x-ray was unremarkable.  Patient was seen by Dr. Stanford Breed, with vascular surgery who had for lower extremity angiogram with possible intervention on 12/20/2020.  Hospital Course:   Following conditions were addressed during hospitalization as listed below,  Ischemic right fourth toe with nonhealing interdigit ulcer with cellulitis.   History of right femoral-popliteal  bypass.     During hospitalization patient received IV vancomycin and cefepime.  Vascular surgery was consulted and patient  underwent angiogram and subsequently underwent Right common femoral and profundofemoral endarterectomy with right common femoral to below-knee popliteal artery bypass on 12/21/2020.  Vascular surgery recommended continuation of antibiotic on discharge.  He was also seen by physical  therapy who recommended home PT on discharge.  Home health has been ordered for discharge.   CKD stage II with previous history of left renal artery stenosis. Creatinine at remained stable.  Will need outpatient monitoring of renal function.   Essential HTN   Patient is on losartan-hydrochlorothiazide and amlodipine at home.  This will be resumed on discharge.   COPD Continue Symbicort on discharge.   History of cerebral aneurysm -Asymptomatic.  Unchanged on recent MRA head in August  Disposition.  At this time, patient is stable for disposition home with outpatient PCP and vascular surgery follow-up  Medical Consultants:   Vascular surgery  Procedures:    Angiogram on 12/20/20 Right common femoral and profundofemoral endarterectomy with right common femoral to below-knee popliteal artery bypass on 12/21/2020 Subjective:   Today, patient was seen and examined at bedside.  In good spirits.  Denies overt pain.  Denies any fever chills.  Discharge Exam:   Vitals:   12/25/20 0821 12/25/20 0847  BP: (!) 100/58   Pulse: 100   Resp: 17   Temp: 98 F (36.7 C)   SpO2: 99% 97%   Vitals:   12/24/20 2235 12/25/20 0301 12/25/20 0821 12/25/20 0847  BP:  106/65 (!) 100/58   Pulse: 88 80 100   Resp: 16 18 17    Temp: 98 F (36.7 C)  98 F (36.7 C) 98 F (36.7 C)   TempSrc: Oral Oral Oral   SpO2: 97% 98% 99% 97%  Weight:  78.4 kg    Height:        General: Alert awake, not in obvious distress HENT: pupils equally reacting to light,  No scleral pallor or icterus noted. Oral  mucosa is moist.  Chest:  Clear breath sounds.  Diminished breath sounds bilaterally. No crackles or wheezes.  CVS: S1 &S2 heard. No murmur.  Regular rate and rhythm. Abdomen: Soft, nontender, nondistended.  Bowel sounds are heard.   Extremities: No cyanosis, clubbing or edema.  Peripheral pulses are palpable.  Right lower extremity with incision.  Bluish discoloration of the fourth toe on the right.. Psych: Alert, awake and oriented, normal mood CNS:  No cranial nerve deficits.  Power equal in all extremities.   Skin: Warm and dry.  No rashes noted.  Erythema from the dorsum of the foot has improved  The results of significant diagnostics from this hospitalization (including imaging, microbiology, ancillary and laboratory) are listed below for reference.     Diagnostic Studies:   PERIPHERAL VASCULAR CATHETERIZATION  Result Date: 12/20/2020 Patient name: Lance Dillon MRN: [id number] DOB: 04-09-1940 Sex: male 12/20/2020 Pre-operative Diagnosis: Critical right lower extremity ischemia with fourth toe ulceration Post-operative diagnosis:  Same Surgeon:  Eda Paschal. Donzetta Matters, MD Procedure Performed: 1.  Ultrasound-guided cannulation left common femoral artery 2.  Aortogram 3.  Selection of right external leg artery and right lower extremity angiography 4.  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  He now has a fourth toe ulceration with depressed ABIs and has previous duplex demonstrating occluded bypass graft.  He is now indicated for angiography with possible intervention. Findings: Left common femoral artery appeared to be occluded I was able to cannulate this just at the top prior to a collateral and get access.  Performed aortogram which demonstrated heavily calcified aorta and bilateral common and external iliac arteries.  I then crossed  over to the common iliac artery on the right where I had collaterals to fill the right lower extremity.  I performed angiography was demonstrated occluded right common femoral with long segment occlusion of his SFA which was heavily calcified reconstitutes at the level of the knee where it is diseased below the knee is much less diseased appears to have runoff via the anterior tibial and peroneal arteries. Plan will be for right common femoral endarterectomy and femoral to below-knee popliteal artery bypass which will likely need to be performed with graft given the vein has been harvested in the past for bypass.  Procedure:  The patient was identified in the holding area and taken to room 8.  The patient was then placed supine on the table and prepped and draped in the usual sterile fashion.  A time out was called.  Ultrasound was used to evaluate the left common femoral artery.  This was heavily calcified.  I did find an area cephalad that was apparently patent.  I anesthetized the area and cannulated with micropuncture needle followed by wire and sheath.  I placed a Bentson wire followed by a 5 [place] sheath.  I placed an Omni catheter to the level of L1 and performed aortogram.  I then crossed the bifurcation with an Omni catheter and Bentson wire placed the catheter into the external  neck artery on the right and perform right lower extremity angiography with the above findings.  I elected for no intervention.  The catheter was removed over wire.  Sheath will be pulled in postoperative holding.  He tolerated procedure without any complication. Contrast: 80 cc Brandon C. Donzetta Matters, MD [facility] [place] Office: [phone] Pager: [phone]   DG Foot Complete Right  Result Date: 12/19/2020 CLINICAL DATA:  Soreness in the fourth and fifth toes. EXAM: RIGHT FOOT COMPLETE - 3+ VIEW COMPARISON:  None. FINDINGS: There is no evidence of fracture or dislocation. There is no evidence of  arthropathy or other focal bone abnormality. Soft tissues are unremarkable. IMPRESSION: Negative. Electronically Signed   By: Misty Stanley M.D.   On: 12/19/2020 11:21     Labs:   Basic Metabolic Panel: Recent Labs  Lab 12/20/20 0253 12/21/20 0254 12/21/20 1442 12/22/20 0121 12/23/20 0112 12/24/20 0206  NA 133* 133* 136 132* 134* 133*  K 3.5 4.1 4.2 4.4 3.9 4.2  CL 101 102  --  105 106 102  CO2 25 24  --  17* 23 24  GLUCOSE 112* 113*  --  237* 119* 109*  BUN 25* 18  --  20 19 17   CREATININE 1.27* 1.16  --  1.27* 1.17 1.26*  CALCIUM 8.2* 8.3*  --  7.6* 7.8* 7.8*  MG  --   --   --   --   --  1.9   GFR Estimated Creatinine Clearance: 45.2 mL/min (A) (by C-G formula based on SCr of 1.26 mg/dL (H)). Liver Function Tests: Recent Labs  Lab 12/19/20 1128  AST 16  ALT 15  ALKPHOS 68  BILITOT 1.3*  PROT 7.0  ALBUMIN 4.1   No results for input(s): LIPASE, AMYLASE in the last 168 hours. No results for input(s): AMMONIA in the last 168 hours. Coagulation profile No results for input(s): INR, PROTIME in the last 168 hours.  CBC: Recent Labs  Lab 12/19/20 1128 12/20/20 0253 12/21/20 0254 12/21/20 1442 12/22/20 0121 12/23/20 0112 12/24/20 0206  WBC 10.2 7.0 7.0  --  10.1 9.4 7.5  NEUTROABS 7.5  --   --   --   --   --   --   HGB 13.4 11.6* 11.0* 8.5* 9.7* 8.8* 9.0*  HCT 39.4 34.3* 32.7* 25.0* 28.6* 26.5* 27.5*  MCV 94.3 95.3 95.3  --  92.3 93.3 94.8  PLT 194 153 154  --  121* 125* 143*   Cardiac Enzymes: No results for input(s): CKTOTAL, CKMB, CKMBINDEX, TROPONINI in the last 168 hours. BNP: Invalid input(s): POCBNP CBG: No results for input(s): GLUCAP in the last 168 hours. D-Dimer No results for input(s): DDIMER in the last 72 hours. Hgb A1c No results for input(s): HGBA1C in the last 72 hours. Lipid Profile No results for input(s): CHOL, HDL, LDLCALC, TRIG, CHOLHDL, LDLDIRECT in the last 72 hours. Thyroid function studies No results for input(s): TSH,  T4TOTAL, T3FREE, THYROIDAB in the last 72 hours.  Invalid input(s): FREET3 Anemia work up No results for input(s): VITAMINB12, FOLATE, FERRITIN, TIBC, IRON, RETICCTPCT in the last 72 hours. Microbiology Recent Results (from the past 240 hour(s))  Blood culture (routine x 2)     Status: None   Collection Time: 12/19/20 10:58 AM   Specimen: BLOOD  Result Value Ref Range Status   Specimen Description BLOOD RIGHT ANTECUBITAL  Final   Special Requests   Final    BOTTLES DRAWN AEROBIC AND ANAEROBIC Blood Culture adequate  volume   Culture   Final    NO GROWTH 5 DAYS Performed at [facility], [place] [address], [place], [place] [address]    Report Status 12/24/2020 FINAL  Final  Blood culture (routine x 2)     Status: None   Collection Time: 12/19/20 11:03 AM   Specimen: BLOOD  Result Value Ref Range Status   Specimen Description BLOOD LEFT ANTECUBITAL  Final   Special Requests   Final    BOTTLES DRAWN AEROBIC AND ANAEROBIC Blood Culture adequate volume   Culture   Final    NO GROWTH 5 DAYS Performed at [facility], [place] [address], [place], [place] [address]    Report Status 12/24/2020 FINAL  Final  Resp Panel by RT-PCR (Flu A&B, Covid) Nasopharyngeal Swab     Status: None   Collection Time: 12/19/20 12:21 PM   Specimen: Nasopharyngeal Swab; Nasopharyngeal(NP) swabs in vial transport medium  Result Value Ref Range Status   SARS Coronavirus 2 by RT PCR NEGATIVE NEGATIVE Final    Comment: (NOTE) SARS-CoV-2 target nucleic acids are NOT DETECTED.  The SARS-CoV-2 RNA is generally detectable in upper respiratory specimens during the acute phase of infection. The lowest concentration of SARS-CoV-2 viral copies this assay can detect is 138 copies/mL. A negative result does not preclude SARS-Cov-2 infection and should not be used as the sole basis for treatment or other patient management decisions. A negative result may occur with  improper specimen collection/handling,  submission of specimen other than nasopharyngeal swab, presence of viral mutation(s) within the areas targeted by this assay, and inadequate number of viral copies(<138 copies/mL). A negative result must be combined with clinical observations, patient history, and epidemiological information. The expected result is Negative.  Fact Sheet for Patients:  EntrepreneurPulse.com.au  Fact Sheet for Healthcare Providers:  IncredibleEmployment.be  This test is no t yet approved or cleared by the [place] FDA and  has been authorized for detection and/or diagnosis of SARS-CoV-2 by FDA under an Emergency Use Authorization (EUA). This EUA will remain  in effect (meaning this test can be used) for the duration of the COVID-19 declaration under Section 564(b)(1) of the Act, 21 U.S.C.section 360bbb-3(b)(1), unless the authorization is terminated  or revoked sooner.       Influenza A by PCR NEGATIVE NEGATIVE Final   Influenza B by PCR NEGATIVE NEGATIVE Final    Comment: (NOTE) The Xpert Xpress SARS-CoV-2/FLU/RSV plus assay is intended as an aid in the diagnosis of influenza from Nasopharyngeal swab specimens and should not be used as a sole basis for treatment. Nasal washings and aspirates are unacceptable for Xpert Xpress SARS-CoV-2/FLU/RSV testing.  Fact Sheet for Patients: EntrepreneurPulse.com.au  Fact Sheet for Healthcare Providers: IncredibleEmployment.be  This test is not yet approved or cleared by the [place] FDA and has been authorized for detection and/or diagnosis of SARS-CoV-2 by FDA under an Emergency Use Authorization (EUA). This EUA will remain in effect (meaning this test can be used) for the duration of the COVID-19 declaration under Section 564(b)(1) of the Act, 21 U.S.C. section 360bbb-3(b)(1), unless the authorization is terminated or revoked.  Performed at [facility],  [address], [place], [place] [address]   Surgical pcr screen     Status: Abnormal   Collection Time: 12/21/20 10:30 AM  Result Value Ref Range Status   MRSA, PCR NEGATIVE NEGATIVE Final   Staphylococcus aureus POSITIVE (A) NEGATIVE Final    Comment: (NOTE) The Xpert SA Assay (  FDA approved for NASAL specimens in patients 54 years of age and older), is one component of a comprehensive surveillance program. It is not intended to diagnose infection nor to guide or monitor treatment. Performed at [facility], [place] [address], [place], [place] [address]      Discharge Instructions:   Discharge Instructions     Call MD for:  redness, tenderness, or signs of infection (pain, swelling, redness, odor or green/yellow discharge around incision site)   Complete by: As directed    Call MD for:  severe uncontrolled pain   Complete by: As directed    Call MD for:  temperature >100.4   Complete by: As directed    Diet - low sodium heart healthy   Complete by: As directed    Discharge instructions   Complete by: As directed    Follow-up with your primary care provider in 1 week.  Follow-up with vascular surgery in 3 weeks (office to schedule).  Complete the course of antibiotic.  No overexertion.  If you experience worsening symptoms including increasing fever, chills, worsening pain please seek medical attention.   Increase activity slowly   Complete by: As directed    No wound care   Complete by: As directed       Allergies as of 12/25/2020       Reactions   Avelox [moxifloxacin Hcl In Nacl] Diarrhea   Moxifloxacin Diarrhea   Cilostazol Diarrhea        Medication List     STOP taking these medications    cilostazol 50 MG tablet Commonly known as: PLETAL       TAKE these medications    acetaminophen 500 MG tablet Commonly known as: TYLENOL Take 500 mg by mouth every 6 (six) hours as needed for mild pain or headache.   amLODipine 5 MG tablet Commonly  known as: NORVASC Take 5 mg by mouth at bedtime.   aspirin 81 MG tablet Take 81 mg by mouth at bedtime.   budesonide-formoterol 80-4.5 MCG/ACT inhaler Commonly known as: Symbicort Inhale 2 puffs into the lungs 2 (two) times daily as needed.   CALTRATE 600+D PO Take 1 tablet by mouth daily.   cefdinir 300 MG capsule Commonly known as: OMNICEF Take 2 capsules (600 mg total) by mouth daily for 5 days.   docusate sodium 100 MG capsule Commonly known as: COLACE Take 1 capsule (100 mg total) by mouth daily as needed for mild constipation.   doxycycline 100 MG tablet Commonly known as: VIBRA-TABS Take 1 tablet (100 mg total) by mouth 2 (two) times daily for 5 days.   Fish Oil 1200 MG Caps Take 3,600 mg by mouth daily.   fluticasone 50 MCG/ACT nasal spray Commonly known as: FLONASE Place 2 sprays into both nostrils daily as needed for rhinitis or allergies (or nasal congestion).   HYDROcodone-acetaminophen 5-325 MG tablet Commonly known as: NORCO/VICODIN Take 1 tablet by mouth every 6 (six) hours as needed for moderate pain.   latanoprost 0.005 % ophthalmic solution Commonly known as: XALATAN Place 1 drop into the left eye at bedtime.   losartan-hydrochlorothiazide 100-12.5 MG tablet Commonly known as: HYZAAR Take 1 tablet by mouth at bedtime.   LUTEIN PO Take 1 capsule by mouth daily.   metoprolol succinate 25 MG 24 hr tablet Commonly known as: TOPROL-XL TAKE 1 TABLET (25 MG TOTAL) BY MOUTH DAILY. ** DO NOT CRUSH ** (BETA BLOCKER) What changed:  when to take this additional instructions  rosuvastatin 10 MG tablet Commonly known as: CRESTOR TAKE 1 TABLET BY MOUTH EVERY DAY What changed: when to take this   Vitamin B-12 2500 MCG Subl Place 2,500 mcg under the tongue daily.               Durable Medical Equipment  (From admission, onward)           Start     Ordered   12/24/20 1209  For home use only DME Walker rolling  Once       Question Answer  Comment  Walker: With 5 Inch Wheels   Patient needs a walker to treat with the following condition Weakness generalized      12/24/20 1208            Follow-up Information     [facility] Follow up in 3 week(s).   Specialty: Vascular Surgery Why: Office will call you to arrange your appt (sent) Contact information: [place], [place] Follow up in 1 week(s).   Specialty: Internal Medicine Contact information: [address] [place] [address] [phone]         Leonie Man, MD .   Specialty: Cardiology Contact information: [address] [place] [address] [place], [place] Follow up.   Why: HHPT/OT/RN arranged- they will contact you post discharge to schedule initial visit- (please contact hospital liaison Sharmon Revere for any questions- [phone]) Contact information: [place] [address] [phone]                 Time coordinating discharge: 39 minutes  Signed:  Cristan Hout  [facility] 12/25/2020, 9:17 AM

## 2020-12-25 NOTE — Progress Notes (Signed)
  Progress Note    12/25/2020 8:48 AM 4 Days Post-Op  Subjective:  R foot feeling better over past 24 hours   Vitals:   12/25/20 0821 12/25/20 0847  BP: (!) 100/58   Pulse: 100   Resp: 17   Temp: 98 F (36.7 C)   SpO2: 99% 97%   Physical Exam Lungs:  non labored Incisions:  R groin and popliteal incision c/d/i Extremities:  R 4th toe dry with dusky discoloration Neurologic: A&O  CBC    Component Value Date/Time   WBC 7.5 12/24/2020 0206   RBC 2.90 (L) 12/24/2020 0206   HGB 9.0 (L) 12/24/2020 0206   HCT 27.5 (L) 12/24/2020 0206   PLT 143 (L) 12/24/2020 0206   MCV 94.8 12/24/2020 0206   MCH 31.0 12/24/2020 0206   MCHC 32.7 12/24/2020 0206   RDW 13.6 12/24/2020 0206   LYMPHSABS 1.6 12/19/2020 1128   MONOABS 0.8 12/19/2020 1128   EOSABS 0.2 12/19/2020 1128   BASOSABS 0.1 12/19/2020 1128    BMET    Component Value Date/Time   NA 133 (L) 12/24/2020 0206   K 4.2 12/24/2020 0206   CL 102 12/24/2020 0206   CO2 24 12/24/2020 0206   GLUCOSE 109 (H) 12/24/2020 0206   BUN 17 12/24/2020 0206   CREATININE 1.26 (H) 12/24/2020 0206   CALCIUM 7.8 (L) 12/24/2020 0206   GFRNONAA 58 (L) 12/24/2020 0206   GFRAA 51 (L) 11/25/2015 1210    INR    Component Value Date/Time   INR 1.00 04/27/2015 0655     Intake/Output Summary (Last 24 hours) at 12/25/2020 0848 Last data filed at 12/24/2020 2042 Gross per 24 hour  Intake 240 ml  Output 800 ml  Net -560 ml      Assessment/Plan:  80 y.o. male is s/p redo R fem pop with PTFE 4 Days Post-Op   R foot well perfused with brisk DP and PT signals Transitioned to p.o. abx yesterday Ok for d/c home from vascular standpoint Allow toes to demarcate and we will follow as an outpatient   Dagoberto Ligas, PA-C [facility] [phone] 12/25/2020 8:48 AM  VASCULAR STAFF ADDENDUM: I have independently interviewed and examined the patient. I agree with the above.  Will follow up in 2 weeks   J. Melene Muller, MD [facility] Phone Number: [phone] 12/25/2020 8:48 AM

## 2020-12-25 NOTE — Progress Notes (Addendum)
11/25 2230 RN observed puffiness around right femoral area incision. Area is palpable. No bleeding, hematoma. Dorsalis pedis pulse palpable with a doppler. RN will keep an eye on area   11/26 0530 Puffiness around femoral region increased in size. No hematoma, no bleeding, a little bruising. Dorsalis pedis pulse palpable with doppler. RN will pass it onto day nurse to keep an eye on area.

## 2020-12-25 NOTE — Progress Notes (Signed)
D/c tele  and IV. Went over AVS with pt and all questions were addressed. Pt received rolling walker yesterday and [facility] services set up per case manager.  Lavenia Atlas, RN

## 2020-12-25 NOTE — Plan of Care (Signed)
  Problem: Clinical Measurements: Goal: Will remain free from infection Outcome: Progressing Goal: Cardiovascular complication will be avoided Outcome: Progressing   Problem: Elimination: Goal: Will not experience complications related to bowel motility Outcome: Progressing   Problem: Skin Integrity: Goal: Risk for impaired skin integrity will decrease Outcome: Progressing

## 2020-12-25 NOTE — Progress Notes (Signed)
  Progress Note    12/25/2020 8:13 AM 4 Days Post-Op  Subjective:  R foot feeling better over past 24 hours   Vitals:   12/24/20 2235 12/25/20 0301  BP:  106/65  Pulse: 88 80  Resp: 16 18  Temp: 98 F (36.7 C) 98 F (36.7 C)  SpO2: 97% 98%   Physical Exam Lungs:  non labored Incisions:  R groin and popliteal incision c/d/i Extremities:  R 4th toe dry with dusky discoloration Neurologic: A&O  CBC    Component Value Date/Time   WBC 7.5 12/24/2020 0206   RBC 2.90 (L) 12/24/2020 0206   HGB 9.0 (L) 12/24/2020 0206   HCT 27.5 (L) 12/24/2020 0206   PLT 143 (L) 12/24/2020 0206   MCV 94.8 12/24/2020 0206   MCH 31.0 12/24/2020 0206   MCHC 32.7 12/24/2020 0206   RDW 13.6 12/24/2020 0206   LYMPHSABS 1.6 12/19/2020 1128   MONOABS 0.8 12/19/2020 1128   EOSABS 0.2 12/19/2020 1128   BASOSABS 0.1 12/19/2020 1128    BMET    Component Value Date/Time   NA 133 (L) 12/24/2020 0206   K 4.2 12/24/2020 0206   CL 102 12/24/2020 0206   CO2 24 12/24/2020 0206   GLUCOSE 109 (H) 12/24/2020 0206   BUN 17 12/24/2020 0206   CREATININE 1.26 (H) 12/24/2020 0206   CALCIUM 7.8 (L) 12/24/2020 0206   GFRNONAA 58 (L) 12/24/2020 0206   GFRAA 51 (L) 11/25/2015 1210    INR    Component Value Date/Time   INR 1.00 04/27/2015 0655     Intake/Output Summary (Last 24 hours) at 12/25/2020 0813 Last data filed at 12/24/2020 2042 Gross per 24 hour  Intake 360 ml  Output 800 ml  Net -440 ml     Assessment/Plan:  80 y.o. male is s/p redo R fem pop with PTFE 4 Days Post-Op   R foot well perfused with brisk DP and PT signals Transitioned to p.o. abx yesterday Ok for d/c home from vascular standpoint Allow toes to demarcate and we will follow as an outpatient   Dagoberto Ligas, PA-C [facility] [phone] 12/25/2020 8:13 AM

## 2020-12-26 ENCOUNTER — Other Ambulatory Visit: Payer: Self-pay | Admitting: Physician Assistant

## 2020-12-26 MED ORDER — CEFDINIR 300 MG PO CAPS: 600.0000 mg | ORAL_CAPSULE | Freq: Every day | ORAL | 0 refills | Status: DC

## 2020-12-27 ENCOUNTER — Telehealth: Payer: Self-pay

## 2020-12-27 ENCOUNTER — Encounter (HOSPITAL_COMMUNITY): Payer: Self-pay | Admitting: Vascular Surgery

## 2020-12-27 ENCOUNTER — Other Ambulatory Visit: Payer: Self-pay

## 2020-12-27 ENCOUNTER — Ambulatory Visit (INDEPENDENT_AMBULATORY_CARE_PROVIDER_SITE_OTHER): Payer: Medicare HMO | Admitting: Physician Assistant

## 2020-12-27 ENCOUNTER — Encounter: Payer: Self-pay | Admitting: Physician Assistant

## 2020-12-27 VITALS — BP 87/57 | HR 94 | Temp 98.4°F | Resp 20 | Ht 68.0 in | Wt 172.0 lb

## 2020-12-27 DIAGNOSIS — I739 Peripheral vascular disease, unspecified: Secondary | ICD-10-CM

## 2020-12-27 NOTE — H&P (View-Only) (Signed)
POST OPERATIVE OFFICE NOTE    CC:  F/u for surgery  HPI:  This is a 80 y.o. male who is s/p right common femoral and profundofemoral endarterectomy and Right common femoral to below-knee popliteal artery bypass with 6 mm ringed PTFE on 12/21/2020 by Dr. Donzetta Matters for ulceration of the right 4th toe.  He was discharged home on 12/25/2020 on cefdinir and doxycycline.    Pt called today with concerns for redness spreading from the right 4th toe and increased drainage.  He returns today for evaluation of this and is with his wife.  Pt states that he is having increased pain in the 4th toe.  He states there is some more redness on the foot.  His wife states this has gotten worse since yesterday morning when she took a picture.  He has had some swelling in his RLE but improves with elevation.  He has not had fever and his temp this morning is 98.    Allergies  Allergen Reactions   Avelox [Moxifloxacin Hcl In Nacl] Diarrhea   Moxifloxacin Diarrhea   Cilostazol Diarrhea    Current Outpatient Medications  Medication Sig Dispense Refill   acetaminophen (TYLENOL) 500 MG tablet Take 500 mg by mouth every 6 (six) hours as needed for mild pain or headache.     amLODipine (NORVASC) 5 MG tablet Take 5 mg by mouth at bedtime.     aspirin 81 MG tablet Take 81 mg by mouth at bedtime.     budesonide-formoterol (SYMBICORT) 80-4.5 MCG/ACT inhaler Inhale 2 puffs into the lungs 2 (two) times daily as needed. (Patient not taking: Reported on 12/20/2020) 1 each 2   Calcium Carbonate-Vitamin D (CALTRATE 600+D PO) Take 1 tablet by mouth daily.      cefdinir (OMNICEF) 300 MG capsule Take 2 capsules (600 mg total) by mouth daily for 5 days. 10 capsule 0   Cyanocobalamin (VITAMIN B-12) 2500 MCG SUBL Place 2,500 mcg under the tongue daily.     docusate sodium (COLACE) 100 MG capsule Take 1 capsule (100 mg total) by mouth daily as needed for mild constipation.  0   doxycycline (VIBRA-TABS) 100 MG tablet Take 1 tablet (100 mg  total) by mouth 2 (two) times daily for 5 days. 10 tablet 0   fluticasone (FLONASE) 50 MCG/ACT nasal spray Place 2 sprays into both nostrils daily as needed for rhinitis or allergies (or nasal congestion).     HYDROcodone-acetaminophen (NORCO/VICODIN) 5-325 MG tablet Take 1 tablet by mouth every 6 (six) hours as needed for moderate pain. 20 tablet 0   latanoprost (XALATAN) 0.005 % ophthalmic solution Place 1 drop into the left eye at bedtime.      losartan-hydrochlorothiazide (HYZAAR) 100-12.5 MG tablet Take 1 tablet by mouth at bedtime.     LUTEIN PO Take 1 capsule by mouth daily.     metoprolol succinate (TOPROL-XL) 25 MG 24 hr tablet TAKE 1 TABLET (25 MG TOTAL) BY MOUTH DAILY. ** DO NOT CRUSH ** (BETA BLOCKER) (Patient taking differently: Take 25 mg by mouth See admin instructions. Take 25 mg by mouth at bedtime ** DO NOT CRUSH **    (BETA BLOCKER)) 90 tablet 3   Omega-3 Fatty Acids (FISH OIL) 1200 MG CAPS Take 3,600 mg by mouth daily.     rosuvastatin (CRESTOR) 10 MG tablet TAKE 1 TABLET BY MOUTH EVERY DAY (Patient taking differently: Take 10 mg by mouth at bedtime.) 90 tablet 3   No current facility-administered medications for this visit.  ROS:  See HPI  Physical Exam:  Today's Vitals   12/27/20 1051  BP: (!) 87/57  Pulse: 94  Resp: 20  Temp: 98.4 F (36.9 C)  TempSrc: Temporal  SpO2: 96%  Weight: 172 lb (78 kg)  Height: 5\' 8"  (1.727 m)  PainSc: 6    Body mass index is 26.15 kg/m.   Incision:  right groin incision is healing-he does have some ecchymosis and a small hematoma.  Right below knee incision is healing nicely.  Extremities:  brisk right DP/PT doppler signals.        Assessment/Plan:  This is a 80 y.o. male who is s/p: right common femoral and profundofemoral endarterectomy and Right common femoral to below-knee popliteal artery bypass with 6 mm ringed PTFE on 12/21/2020 by Dr. Donzetta Matters for ulceration of the right 4th toe who comes in today with increased  drainage and redness.   -pt continues to have brisk doppler signals right DP/PT.  There is some mild erythema on the dorsum of the foot proximal to his toes.  His 4th toe has had some drainage but I am unable to express any today.   -he has not missed any abx.   -discussed with Dr. Donzetta Matters and will schedule for right 4th toe amputation tomorrow, 11/29.  Will ask [facility] to admit when he is in recovery. -pt knows that if he feels worse or his toe worsens, he is advised to go to the ER.   Leontine Locket, [facility] [phone]   Clinic MD:  Donzetta Matters

## 2020-12-27 NOTE — Progress Notes (Signed)
POST OPERATIVE OFFICE NOTE    CC:  F/u for surgery  HPI:  This is a 80 y.o. male who is s/p right common femoral and profundofemoral endarterectomy and Right common femoral to below-knee popliteal artery bypass with 6 mm ringed PTFE on 12/21/2020 by Dr. Donzetta Matters for ulceration of the right 4th toe.  He was discharged home on 12/25/2020 on cefdinir and doxycycline.    Pt called today with concerns for redness spreading from the right 4th toe and increased drainage.  He returns today for evaluation of this and is with his wife.  Pt states that he is having increased pain in the 4th toe.  He states there is some more redness on the foot.  His wife states this has gotten worse since yesterday morning when she took a picture.  He has had some swelling in his RLE but improves with elevation.  He has not had fever and his temp this morning is 98.    Allergies  Allergen Reactions   Avelox [Moxifloxacin Hcl In Nacl] Diarrhea   Moxifloxacin Diarrhea   Cilostazol Diarrhea    Current Outpatient Medications  Medication Sig Dispense Refill   acetaminophen (TYLENOL) 500 MG tablet Take 500 mg by mouth every 6 (six) hours as needed for mild pain or headache.     amLODipine (NORVASC) 5 MG tablet Take 5 mg by mouth at bedtime.     aspirin 81 MG tablet Take 81 mg by mouth at bedtime.     budesonide-formoterol (SYMBICORT) 80-4.5 MCG/ACT inhaler Inhale 2 puffs into the lungs 2 (two) times daily as needed. (Patient not taking: Reported on 12/20/2020) 1 each 2   Calcium Carbonate-Vitamin D (CALTRATE 600+D PO) Take 1 tablet by mouth daily.      cefdinir (OMNICEF) 300 MG capsule Take 2 capsules (600 mg total) by mouth daily for 5 days. 10 capsule 0   Cyanocobalamin (VITAMIN B-12) 2500 MCG SUBL Place 2,500 mcg under the tongue daily.     docusate sodium (COLACE) 100 MG capsule Take 1 capsule (100 mg total) by mouth daily as needed for mild constipation.  0   doxycycline (VIBRA-TABS) 100 MG tablet Take 1 tablet (100 mg  total) by mouth 2 (two) times daily for 5 days. 10 tablet 0   fluticasone (FLONASE) 50 MCG/ACT nasal spray Place 2 sprays into both nostrils daily as needed for rhinitis or allergies (or nasal congestion).     HYDROcodone-acetaminophen (NORCO/VICODIN) 5-325 MG tablet Take 1 tablet by mouth every 6 (six) hours as needed for moderate pain. 20 tablet 0   latanoprost (XALATAN) 0.005 % ophthalmic solution Place 1 drop into the left eye at bedtime.      losartan-hydrochlorothiazide (HYZAAR) 100-12.5 MG tablet Take 1 tablet by mouth at bedtime.     LUTEIN PO Take 1 capsule by mouth daily.     metoprolol succinate (TOPROL-XL) 25 MG 24 hr tablet TAKE 1 TABLET (25 MG TOTAL) BY MOUTH DAILY. ** DO NOT CRUSH ** (BETA BLOCKER) (Patient taking differently: Take 25 mg by mouth See admin instructions. Take 25 mg by mouth at bedtime ** DO NOT CRUSH **    (BETA BLOCKER)) 90 tablet 3   Omega-3 Fatty Acids (FISH OIL) 1200 MG CAPS Take 3,600 mg by mouth daily.     rosuvastatin (CRESTOR) 10 MG tablet TAKE 1 TABLET BY MOUTH EVERY DAY (Patient taking differently: Take 10 mg by mouth at bedtime.) 90 tablet 3   No current facility-administered medications for this visit.  ROS:  See HPI  Physical Exam:  Today's Vitals   12/27/20 1051  BP: (!) 87/57  Pulse: 94  Resp: 20  Temp: 98.4 F (36.9 C)  TempSrc: Temporal  SpO2: 96%  Weight: 172 lb (78 kg)  Height: 5\' 8"  (1.727 m)  PainSc: 6    Body mass index is 26.15 kg/m.   Incision:  right groin incision is healing-he does have some ecchymosis and a small hematoma.  Right below knee incision is healing nicely.  Extremities:  brisk right DP/PT doppler signals.        Assessment/Plan:  This is a 80 y.o. male who is s/p: right common femoral and profundofemoral endarterectomy and Right common femoral to below-knee popliteal artery bypass with 6 mm ringed PTFE on 12/21/2020 by Dr. Donzetta Matters for ulceration of the right 4th toe who comes in today with increased  drainage and redness.   -pt continues to have brisk doppler signals right DP/PT.  There is some mild erythema on the dorsum of the foot proximal to his toes.  His 4th toe has had some drainage but I am unable to express any today.   -he has not missed any abx.   -discussed with Dr. Donzetta Matters and will schedule for right 4th toe amputation tomorrow, 11/29.  Will ask [facility] to admit when he is in recovery. -pt knows that if he feels worse or his toe worsens, he is advised to go to the ER.   Leontine Locket, [facility] [phone]   Clinic MD:  Donzetta Matters

## 2020-12-27 NOTE — Telephone Encounter (Signed)
Patient was just released from the hospital on 11/26 s/p fem endart and FPBG- advised to call if foot worsened. Today patient has redness spreading up from the right fourth toe. It was dry, it is now draining "bloody mucous stuff". Temperature is 98, but says he is having some chills. Foot is getting increasingly painful. Placed on schedule for wound check.

## 2020-12-27 NOTE — Progress Notes (Signed)
DUE TO COVID-19 ONLY ONE VISITOR IS ALLOWED TO COME WITH YOU AND STAY IN THE WAITING ROOM ONLY DURING PRE OP AND PROCEDURE DAY OF SURGERY.   Two VISITORS MAY VISIT WITH YOU AFTER SURGERY IN YOUR PRIVATE ROOM DURING VISITING HOURS ONLY!  PCP - Dr Deland Pretty Cardiologist - Dr Quay Burow  Chest x-ray - n/a EKG - 06/23/20 Stress Test - 07/06/11 ECHO - 01/2017 Cardiac Cath - n/a  ICD Pacemaker/Loop - n/a  Sleep Study -  n/a CPAP - none  Aspirin Instructions: Follow your surgeon's instructions on when to stop aspirin prior to surgery,  If no instructions were given by your surgeon then you will need to call the office for those instructions.  Anesthesia review: Yes  STOP now taking any Aspirin (unless otherwise instructed by your surgeon), Aleve, Naproxen, Ibuprofen, Motrin, Advil, Goody's, BC's, all herbal medications, fish oil, and all vitamins.   Coronavirus Screening Covid test is scheduled on DOS Do you have any of the following symptoms:  Cough yes/no: No Fever (>100.81F)  yes/no: No Runny nose yes/no: No Sore throat yes/no: No Difficulty breathing/shortness of breath  yes/no: No  Have you traveled in the last 14 days and where? yes/no: No  Patient/Wife Lance Dillon verbalized understanding of instructions that were given via phone.

## 2020-12-28 ENCOUNTER — Inpatient Hospital Stay (HOSPITAL_COMMUNITY): Payer: Medicare HMO | Admitting: Physician Assistant

## 2020-12-28 ENCOUNTER — Inpatient Hospital Stay (HOSPITAL_COMMUNITY)
Admission: RE | Admit: 2020-12-28 | Discharge: 2020-12-30 | DRG: 240 | Disposition: A | Discharge status: Home / Self Care | Payer: Medicare HMO | Attending: Internal Medicine | Admitting: Internal Medicine

## 2020-12-28 ENCOUNTER — Encounter (HOSPITAL_COMMUNITY)
Admission: RE | Disposition: A | Discharge status: Home / Self Care | Payer: Self-pay | Source: Home / Self Care | Attending: Internal Medicine

## 2020-12-28 ENCOUNTER — Encounter (HOSPITAL_COMMUNITY): Payer: Self-pay | Admitting: Vascular Surgery

## 2020-12-28 ENCOUNTER — Other Ambulatory Visit: Payer: Self-pay

## 2020-12-28 DIAGNOSIS — Z6825 Body mass index (BMI) 25.0-25.9, adult: Secondary | ICD-10-CM | POA: Diagnosis not present

## 2020-12-28 DIAGNOSIS — H409 Unspecified glaucoma: Secondary | ICD-10-CM | POA: Diagnosis present

## 2020-12-28 DIAGNOSIS — Z7982 Long term (current) use of aspirin: Secondary | ICD-10-CM | POA: Diagnosis not present

## 2020-12-28 DIAGNOSIS — Z8546 Personal history of malignant neoplasm of prostate: Secondary | ICD-10-CM | POA: Diagnosis not present

## 2020-12-28 DIAGNOSIS — K219 Gastro-esophageal reflux disease without esophagitis: Secondary | ICD-10-CM | POA: Diagnosis present

## 2020-12-28 DIAGNOSIS — I6529 Occlusion and stenosis of unspecified carotid artery: Secondary | ICD-10-CM | POA: Diagnosis present

## 2020-12-28 DIAGNOSIS — I70261 Atherosclerosis of native arteries of extremities with gangrene, right leg: Secondary | ICD-10-CM | POA: Diagnosis present

## 2020-12-28 DIAGNOSIS — I771 Stricture of artery: Secondary | ICD-10-CM | POA: Diagnosis not present

## 2020-12-28 DIAGNOSIS — I129 Hypertensive chronic kidney disease with stage 1 through stage 4 chronic kidney disease, or unspecified chronic kidney disease: Secondary | ICD-10-CM | POA: Diagnosis present

## 2020-12-28 DIAGNOSIS — E440 Moderate protein-calorie malnutrition: Secondary | ICD-10-CM | POA: Insufficient documentation

## 2020-12-28 DIAGNOSIS — Z87891 Personal history of nicotine dependence: Secondary | ICD-10-CM

## 2020-12-28 DIAGNOSIS — L97512 Non-pressure chronic ulcer of other part of right foot with fat layer exposed: Secondary | ICD-10-CM | POA: Diagnosis not present

## 2020-12-28 DIAGNOSIS — R21 Rash and other nonspecific skin eruption: Secondary | ICD-10-CM | POA: Diagnosis not present

## 2020-12-28 DIAGNOSIS — E7800 Pure hypercholesterolemia, unspecified: Secondary | ICD-10-CM | POA: Diagnosis not present

## 2020-12-28 DIAGNOSIS — E785 Hyperlipidemia, unspecified: Secondary | ICD-10-CM | POA: Diagnosis present

## 2020-12-28 DIAGNOSIS — Z923 Personal history of irradiation: Secondary | ICD-10-CM

## 2020-12-28 DIAGNOSIS — I96 Gangrene, not elsewhere classified: Secondary | ICD-10-CM | POA: Diagnosis not present

## 2020-12-28 DIAGNOSIS — N1831 Chronic kidney disease, stage 3a: Secondary | ICD-10-CM | POA: Diagnosis present

## 2020-12-28 DIAGNOSIS — E871 Hypo-osmolality and hyponatremia: Secondary | ICD-10-CM | POA: Diagnosis present

## 2020-12-28 DIAGNOSIS — Z20822 Contact with and (suspected) exposure to covid-19: Secondary | ICD-10-CM | POA: Diagnosis present

## 2020-12-28 DIAGNOSIS — I10 Essential (primary) hypertension: Secondary | ICD-10-CM | POA: Diagnosis present

## 2020-12-28 DIAGNOSIS — Z888 Allergy status to other drugs, medicaments and biological substances status: Secondary | ICD-10-CM

## 2020-12-28 DIAGNOSIS — I701 Atherosclerosis of renal artery: Secondary | ICD-10-CM | POA: Diagnosis present

## 2020-12-28 DIAGNOSIS — N182 Chronic kidney disease, stage 2 (mild): Secondary | ICD-10-CM | POA: Diagnosis not present

## 2020-12-28 DIAGNOSIS — Z8249 Family history of ischemic heart disease and other diseases of the circulatory system: Secondary | ICD-10-CM

## 2020-12-28 DIAGNOSIS — Z7951 Long term (current) use of inhaled steroids: Secondary | ICD-10-CM | POA: Diagnosis not present

## 2020-12-28 DIAGNOSIS — D500 Iron deficiency anemia secondary to blood loss (chronic): Secondary | ICD-10-CM | POA: Diagnosis not present

## 2020-12-28 DIAGNOSIS — Z881 Allergy status to other antibiotic agents status: Secondary | ICD-10-CM

## 2020-12-28 DIAGNOSIS — J449 Chronic obstructive pulmonary disease, unspecified: Secondary | ICD-10-CM | POA: Diagnosis present

## 2020-12-28 DIAGNOSIS — Z8673 Personal history of transient ischemic attack (TIA), and cerebral infarction without residual deficits: Secondary | ICD-10-CM | POA: Diagnosis not present

## 2020-12-28 DIAGNOSIS — M1990 Unspecified osteoarthritis, unspecified site: Secondary | ICD-10-CM | POA: Diagnosis present

## 2020-12-28 DIAGNOSIS — H5461 Unqualified visual loss, right eye, normal vision left eye: Secondary | ICD-10-CM | POA: Diagnosis present

## 2020-12-28 DIAGNOSIS — Z79899 Other long term (current) drug therapy: Secondary | ICD-10-CM | POA: Diagnosis not present

## 2020-12-28 DIAGNOSIS — I700 Atherosclerosis of aorta: Secondary | ICD-10-CM | POA: Diagnosis present

## 2020-12-28 DIAGNOSIS — Z8042 Family history of malignant neoplasm of prostate: Secondary | ICD-10-CM

## 2020-12-28 DIAGNOSIS — Z87442 Personal history of urinary calculi: Secondary | ICD-10-CM

## 2020-12-28 HISTORY — DX: Chronic kidney disease, unspecified: N18.9

## 2020-12-28 HISTORY — DX: Anemia, unspecified: D64.9

## 2020-12-28 HISTORY — PX: AMPUTATION: SHX166

## 2020-12-28 HISTORY — DX: Gastro-esophageal reflux disease without esophagitis: K21.9

## 2020-12-28 LAB — POCT I-STAT, CHEM 8
BUN: 31 mg/dL — ABNORMAL HIGH (ref 8–23)
Calcium, Ion: 1.18 mmol/L (ref 1.15–1.40)
Chloride: 102 mmol/L (ref 98–111)
Creatinine, Ser: 1.4 mg/dL — ABNORMAL HIGH (ref 0.61–1.24)
Glucose, Bld: 114 mg/dL — ABNORMAL HIGH (ref 70–99)
HCT: 28 % — ABNORMAL LOW (ref 39.0–52.0)
Hemoglobin: 9.5 g/dL — ABNORMAL LOW (ref 13.0–17.0)
Potassium: 4.5 mmol/L (ref 3.5–5.1)
Sodium: 136 mmol/L (ref 135–145)
TCO2: 25 mmol/L (ref 22–32)

## 2020-12-28 LAB — SARS CORONAVIRUS 2 BY RT PCR (HOSPITAL ORDER, PERFORMED IN ~~LOC~~ HOSPITAL LAB): SARS Coronavirus 2: NEGATIVE

## 2020-12-28 SURGERY — AMPUTATION DIGIT
Anesthesia: Monitor Anesthesia Care | Site: Toe | Laterality: Right

## 2020-12-28 MED ORDER — SODIUM CHLORIDE 0.9 % IV SOLN: INTRAVENOUS | Status: DC

## 2020-12-28 MED ORDER — ONDANSETRON HCL 4 MG/2ML IJ SOLN
INTRAMUSCULAR | Status: AC
  Filled 2020-12-28: qty 2

## 2020-12-28 MED ORDER — BISACODYL 5 MG PO TBEC: 5.0000 mg | DELAYED_RELEASE_TABLET | Freq: Every day | ORAL | Status: DC | PRN

## 2020-12-28 MED ORDER — ENSURE ENLIVE PO LIQD
237.0000 mL | Freq: Two times a day (BID) | ORAL | Status: DC
  Administered 2020-12-29 – 2020-12-30 (×3): 237 mL via ORAL

## 2020-12-28 MED ORDER — HYDROCODONE-ACETAMINOPHEN 5-325 MG PO TABS
1.0000 | ORAL_TABLET | Freq: Four times a day (QID) | ORAL | Status: DC | PRN
  Administered 2020-12-28 – 2020-12-30 (×6): 1 via ORAL
  Filled 2020-12-28 (×4): qty 1

## 2020-12-28 MED ORDER — MORPHINE SULFATE (PF) 2 MG/ML IV SOLN: 2.0000 mg | INTRAVENOUS | Status: DC | PRN

## 2020-12-28 MED ORDER — CEFAZOLIN SODIUM-DEXTROSE 2-4 GM/100ML-% IV SOLN
INTRAVENOUS | Status: AC
  Filled 2020-12-28: qty 100

## 2020-12-28 MED ORDER — MIDAZOLAM HCL 2 MG/2ML IJ SOLN
INTRAMUSCULAR | Status: AC
  Filled 2020-12-28: qty 2

## 2020-12-28 MED ORDER — PROPOFOL 10 MG/ML IV BOLUS
INTRAVENOUS | Status: AC
  Filled 2020-12-28: qty 20

## 2020-12-28 MED ORDER — ACETAMINOPHEN 160 MG/5ML PO SOLN: 1000.0000 mg | Freq: Once | ORAL | Status: DC | PRN

## 2020-12-28 MED ORDER — ADULT MULTIVITAMIN W/MINERALS CH
1.0000 | ORAL_TABLET | Freq: Every day | ORAL | Status: DC
Start: 2020-12-28 — End: 2020-12-30
  Administered 2020-12-28 – 2020-12-30 (×3): 1 via ORAL
  Filled 2020-12-28 (×2): qty 1

## 2020-12-28 MED ORDER — FENTANYL CITRATE (PF) 100 MCG/2ML IJ SOLN: 25.0000 ug | INTRAMUSCULAR | Status: DC | PRN

## 2020-12-28 MED ORDER — METOPROLOL SUCCINATE ER 25 MG PO TB24
25.0000 mg | ORAL_TABLET | Freq: Every day | ORAL | Status: DC
  Administered 2020-12-28 – 2020-12-29 (×2): 25 mg via ORAL
  Filled 2020-12-28: qty 1

## 2020-12-28 MED ORDER — FENTANYL CITRATE (PF) 100 MCG/2ML IJ SOLN
INTRAMUSCULAR | Status: AC
  Filled 2020-12-28: qty 2

## 2020-12-28 MED ORDER — ONDANSETRON HCL 4 MG/2ML IJ SOLN: 4.0000 mg | Freq: Four times a day (QID) | INTRAMUSCULAR | Status: DC | PRN

## 2020-12-28 MED ORDER — FENTANYL CITRATE (PF) 250 MCG/5ML IJ SOLN
INTRAMUSCULAR | Status: DC | PRN
  Administered 2020-12-28 (×2): 50 ug via INTRAVENOUS

## 2020-12-28 MED ORDER — POLYETHYLENE GLYCOL 3350 17 G PO PACK: 17.0000 g | PACK | Freq: Every day | ORAL | Status: DC | PRN

## 2020-12-28 MED ORDER — LATANOPROST 0.005 % OP SOLN
1.0000 [drp] | Freq: Every day | OPHTHALMIC | Status: DC
  Administered 2020-12-29: 1 [drp] via OPHTHALMIC
  Filled 2020-12-28: qty 2.5

## 2020-12-28 MED ORDER — CEFAZOLIN SODIUM-DEXTROSE 2-4 GM/100ML-% IV SOLN
2.0000 g | INTRAVENOUS | Status: AC
  Administered 2020-12-28: 2 g via INTRAVENOUS

## 2020-12-28 MED ORDER — ONDANSETRON HCL 4 MG PO TABS: 4.0000 mg | ORAL_TABLET | Freq: Four times a day (QID) | ORAL | Status: DC | PRN

## 2020-12-28 MED ORDER — ENOXAPARIN SODIUM 40 MG/0.4ML IJ SOSY
40.0000 mg | PREFILLED_SYRINGE | INTRAMUSCULAR | Status: DC
  Administered 2020-12-28 – 2020-12-29 (×2): 40 mg via SUBCUTANEOUS
  Filled 2020-12-28: qty 0.4

## 2020-12-28 MED ORDER — PHENYLEPHRINE 40 MCG/ML (10ML) SYRINGE FOR IV PUSH (FOR BLOOD PRESSURE SUPPORT)
PREFILLED_SYRINGE | INTRAVENOUS | Status: DC | PRN
  Administered 2020-12-28 (×3): 40 ug via INTRAVENOUS

## 2020-12-28 MED ORDER — CHLORHEXIDINE GLUCONATE 0.12 % MT SOLN
OROMUCOSAL | Status: AC
  Filled 2020-12-28: qty 15

## 2020-12-28 MED ORDER — CHLORHEXIDINE GLUCONATE 4 % EX LIQD: 60.0000 mL | Freq: Once | CUTANEOUS | Status: DC

## 2020-12-28 MED ORDER — ORAL CARE MOUTH RINSE: 15.0000 mL | Freq: Once | OROMUCOSAL | Status: AC

## 2020-12-28 MED ORDER — LIDOCAINE 2% (20 MG/ML) 5 ML SYRINGE
INTRAMUSCULAR | Status: AC
  Filled 2020-12-28: qty 5

## 2020-12-28 MED ORDER — OXYCODONE HCL 5 MG PO TABS: 5.0000 mg | ORAL_TABLET | Freq: Once | ORAL | Status: DC | PRN

## 2020-12-28 MED ORDER — PROPOFOL 10 MG/ML IV BOLUS
INTRAVENOUS | Status: DC | PRN
  Administered 2020-12-28: 10 mg via INTRAVENOUS

## 2020-12-28 MED ORDER — ACETAMINOPHEN 650 MG RE SUPP: 650.0000 mg | Freq: Four times a day (QID) | RECTAL | Status: DC | PRN

## 2020-12-28 MED ORDER — ACETAMINOPHEN 10 MG/ML IV SOLN: 1000.0000 mg | Freq: Once | INTRAVENOUS | Status: DC | PRN

## 2020-12-28 MED ORDER — SODIUM CHLORIDE 0.9 % IV SOLN
2.0000 g | INTRAVENOUS | Status: DC
  Administered 2020-12-28 – 2020-12-29 (×2): 2 g via INTRAVENOUS
  Filled 2020-12-28: qty 20

## 2020-12-28 MED ORDER — LACTATED RINGERS IV SOLN: INTRAVENOUS | Status: DC

## 2020-12-28 MED ORDER — OXYCODONE HCL 5 MG/5ML PO SOLN: 5.0000 mg | Freq: Once | ORAL | Status: DC | PRN

## 2020-12-28 MED ORDER — ACETAMINOPHEN 500 MG PO TABS: 1000.0000 mg | ORAL_TABLET | Freq: Once | ORAL | Status: DC | PRN

## 2020-12-28 MED ORDER — MOMETASONE FURO-FORMOTEROL FUM 100-5 MCG/ACT IN AERO
2.0000 | INHALATION_SPRAY | Freq: Two times a day (BID) | RESPIRATORY_TRACT | Status: DC
  Filled 2020-12-28 (×2): qty 8.8

## 2020-12-28 MED ORDER — PROPOFOL 500 MG/50ML IV EMUL
INTRAVENOUS | Status: DC | PRN
  Administered 2020-12-28: 75 ug/kg/min via INTRAVENOUS

## 2020-12-28 MED ORDER — ROCURONIUM BROMIDE 10 MG/ML (PF) SYRINGE
PREFILLED_SYRINGE | INTRAVENOUS | Status: AC
  Filled 2020-12-28: qty 10

## 2020-12-28 MED ORDER — HYDRALAZINE HCL 20 MG/ML IJ SOLN: 5.0000 mg | INTRAMUSCULAR | Status: DC | PRN

## 2020-12-28 MED ORDER — CHLORHEXIDINE GLUCONATE 0.12 % MT SOLN
15.0000 mL | Freq: Once | OROMUCOSAL | Status: AC
  Administered 2020-12-28: 15 mL via OROMUCOSAL

## 2020-12-28 MED ORDER — ROSUVASTATIN CALCIUM 5 MG PO TABS
10.0000 mg | ORAL_TABLET | Freq: Every day | ORAL | Status: DC
  Administered 2020-12-28 – 2020-12-29 (×2): 10 mg via ORAL
  Filled 2020-12-28: qty 2

## 2020-12-28 MED ORDER — ASPIRIN EC 81 MG PO TBEC
81.0000 mg | DELAYED_RELEASE_TABLET | Freq: Every day | ORAL | Status: DC
  Administered 2020-12-28 – 2020-12-29 (×2): 81 mg via ORAL
  Filled 2020-12-28: qty 1

## 2020-12-28 MED ORDER — ONDANSETRON HCL 4 MG/2ML IJ SOLN
INTRAMUSCULAR | Status: DC | PRN
  Administered 2020-12-28: 4 mg via INTRAVENOUS

## 2020-12-28 MED ORDER — FENTANYL CITRATE (PF) 250 MCG/5ML IJ SOLN
INTRAMUSCULAR | Status: AC
  Filled 2020-12-28: qty 5

## 2020-12-28 MED ORDER — NICOTINE 14 MG/24HR TD PT24
14.0000 mg | MEDICATED_PATCH | Freq: Every day | TRANSDERMAL | Status: DC
  Filled 2020-12-28: qty 1

## 2020-12-28 MED ORDER — PHENYLEPHRINE 40 MCG/ML (10ML) SYRINGE FOR IV PUSH (FOR BLOOD PRESSURE SUPPORT)
PREFILLED_SYRINGE | INTRAVENOUS | Status: AC
  Filled 2020-12-28: qty 10

## 2020-12-28 MED ORDER — METRONIDAZOLE 500 MG/100ML IV SOLN
500.0000 mg | Freq: Three times a day (TID) | INTRAVENOUS | Status: DC
  Administered 2020-12-28 – 2020-12-30 (×6): 500 mg via INTRAVENOUS
  Filled 2020-12-28 (×3): qty 100

## 2020-12-28 MED ORDER — ACETAMINOPHEN 325 MG PO TABS
650.0000 mg | ORAL_TABLET | Freq: Four times a day (QID) | ORAL | Status: DC | PRN
  Administered 2020-12-28: 650 mg via ORAL

## 2020-12-28 MED ORDER — DOCUSATE SODIUM 100 MG PO CAPS
100.0000 mg | ORAL_CAPSULE | Freq: Two times a day (BID) | ORAL | Status: DC
  Administered 2020-12-28 – 2020-12-30 (×4): 100 mg via ORAL
  Filled 2020-12-28 (×3): qty 1

## 2020-12-28 MED ORDER — LACTATED RINGERS IV SOLN: INTRAVENOUS | Status: DC | PRN

## 2020-12-28 MED ORDER — AMLODIPINE BESYLATE 5 MG PO TABS
5.0000 mg | ORAL_TABLET | Freq: Every day | ORAL | Status: DC
  Filled 2020-12-28: qty 1

## 2020-12-28 MED ORDER — 0.9 % SODIUM CHLORIDE (POUR BTL) OPTIME
TOPICAL | Status: DC | PRN
  Administered 2020-12-28: 1000 mL

## 2020-12-28 SURGICAL SUPPLY — 30 items
BAG COUNTER SPONGE SURGICOUNT (BAG) ×2 IMPLANT
BLADE AVERAGE 25X9 (BLADE) IMPLANT
BLADE SAW SGTL 81X20 HD (BLADE) ×2 IMPLANT
BNDG ELASTIC 4X5.8 VLCR STR LF (GAUZE/BANDAGES/DRESSINGS) ×2 IMPLANT
BNDG GAUZE ELAST 4 BULKY (GAUZE/BANDAGES/DRESSINGS) ×2 IMPLANT
CANISTER SUCT 3000ML PPV (MISCELLANEOUS) ×2 IMPLANT
CLIP LIGATING EXTRA MED SLVR (CLIP) ×2 IMPLANT
CLIP LIGATING EXTRA SM BLUE (MISCELLANEOUS) ×2 IMPLANT
COVER SURGICAL LIGHT HANDLE (MISCELLANEOUS) ×2 IMPLANT
DRAPE EXTREMITY T 121X128X90 (DISPOSABLE) ×2 IMPLANT
DRAPE HALF SHEET 40X57 (DRAPES) ×2 IMPLANT
ELECT REM PT RETURN 9FT ADLT (ELECTROSURGICAL) ×2
ELECTRODE REM PT RTRN 9FT ADLT (ELECTROSURGICAL) ×1 IMPLANT
GAUZE SPONGE 4X4 12PLY STRL (GAUZE/BANDAGES/DRESSINGS) ×2 IMPLANT
GLOVE SURG ENC MOIS LTX SZ7.5 (GLOVE) ×2 IMPLANT
GOWN STRL REUS W/ TWL LRG LVL3 (GOWN DISPOSABLE) ×2 IMPLANT
GOWN STRL REUS W/ TWL XL LVL3 (GOWN DISPOSABLE) ×1 IMPLANT
GOWN STRL REUS W/TWL LRG LVL3 (GOWN DISPOSABLE) ×4
GOWN STRL REUS W/TWL XL LVL3 (GOWN DISPOSABLE) ×2
KIT BASIN OR (CUSTOM PROCEDURE TRAY) ×2 IMPLANT
KIT TURNOVER KIT B (KITS) ×2 IMPLANT
NEEDLE HYPO 25GX1X1/2 BEV (NEEDLE) IMPLANT
NS IRRIG 1000ML POUR BTL (IV SOLUTION) ×2 IMPLANT
PACK GENERAL/GYN (CUSTOM PROCEDURE TRAY) ×2 IMPLANT
PAD ARMBOARD 7.5X6 YLW CONV (MISCELLANEOUS) ×4 IMPLANT
SUT ETHILON 3 0 PS 1 (SUTURE) ×2 IMPLANT
SYR CONTROL 10ML LL (SYRINGE) IMPLANT
TOWEL GREEN STERILE (TOWEL DISPOSABLE) ×4 IMPLANT
UNDERPAD 30X36 HEAVY ABSORB (UNDERPADS AND DIAPERS) ×2 IMPLANT
WATER STERILE IRR 1000ML POUR (IV SOLUTION) ×2 IMPLANT

## 2020-12-28 NOTE — Anesthesia Preprocedure Evaluation (Signed)
Anesthesia Evaluation  Patient identified by MRN, date of birth, ID band Patient awake    Reviewed: Allergy & Precautions, NPO status , Patient's Chart, lab work & pertinent test results  History of Anesthesia Complications Negative for: history of anesthetic complications  Airway Mallampati: II  TM Distance: >3 FB Neck ROM: Full    Dental  (+) Dental Advisory Given, Partial Lower, Partial Upper, Missing   Pulmonary COPD,  COPD inhaler, former smoker,    breath sounds clear to auscultation       Cardiovascular hypertension, Pt. on medications and Pt. on home beta blockers (-) angina+ Peripheral Vascular Disease  (-) Past MI + Valvular Problems/Murmurs  Rhythm:Regular  Normal ef in 2012   Neuro/Psych negative psych ROS   GI/Hepatic Neg liver ROS, GERD  Controlled,  Endo/Other    Renal/GU CRFRenal diseaseLab Results      Component                Value               Date                      CREATININE               1.40 (H)            12/28/2020           Lab Results      Component                Value               Date                      K                        4.5                 12/28/2020                Musculoskeletal  (+) Arthritis ,   Abdominal   Peds  Hematology  (+) Blood dyscrasia, anemia , Lab Results      Component                Value               Date                      WBC                      7.5                 12/24/2020                HGB                      9.5 (L)             12/28/2020                HCT                      28.0 (L)            12/28/2020                MCV  94.8                12/24/2020                PLT                      143 (L)             12/24/2020              Anesthesia Other Findings   Reproductive/Obstetrics                             Anesthesia Physical Anesthesia Plan  ASA: 3  Anesthesia Plan: MAC  and Regional   Post-op Pain Management: Regional block   Induction:   PONV Risk Score and Plan: 1 and Propofol infusion  Airway Management Planned: Nasal Cannula  Additional Equipment:   Intra-op Plan:   Post-operative Plan:   Informed Consent: I have reviewed the patients History and Physical, chart, labs and discussed the procedure including the risks, benefits and alternatives for the proposed anesthesia with the patient or authorized representative who has indicated his/her understanding and acceptance.     Dental advisory given  Plan Discussed with: CRNA and Anesthesiologist  Anesthesia Plan Comments:         Anesthesia Quick Evaluation

## 2020-12-28 NOTE — Op Note (Signed)
    Patient name: MICAHEL OMLOR MRN: [id number] DOB: 1940/04/05 Sex: male  12/28/2020 Pre-operative Diagnosis: Gangrene right fourth toe Post-operative diagnosis:  Same Surgeon:  Erlene Quan C. Donzetta Matters, MD Procedure Performed: Right fourth toe ray amputation  Indications: 80 year old male has undergone right lower extremity revascularization for gangrenous right toe.  He is now indicated for amputation.  Findings: There was no evidence of purulence there was adequate bleeding to suggest healing probability.   Procedure:  The patient was identified in the holding area and taken to the operating room where is placed supine on upper table and MAC anesthesia induced.  He was sterilely prepped and draped in the right lower extremity usual fashion, antibiotics were ministered a timeout was called.  A preoperative popliteal block had been placed this was checked and noted to be intact.  An incision was made around the base of the fourth toe.  We dissected back to the bone.  The bone was removed with rongeur back to the level of the fourth metatarsal head.  The of the metatarsal head was removed with rongeur and smoothed with rasp.  The wound was thoroughly irrigated.  I elected to closed with interrupted nylon sutures.  A sterile dressing was placed.  He was awakened from anesthesia having tolerated procedure well without immediate complication.  All counts were correct at completion.  EBL: 10 cc    Dalbert Stillings C. Donzetta Matters, MD [facility] [place] Office: [phone] Pager: [phone]

## 2020-12-28 NOTE — Transfer of Care (Signed)
Immediate Anesthesia Transfer of Care Note  Patient: Lance Dillon  Procedure(s) Performed: RIGHT 4TH TOE AMPUTATION (Right: Toe)  Patient Location: PACU  Anesthesia Type:MAC and Regional  Level of Consciousness: awake, alert  and oriented  Airway & Oxygen Therapy: Patient Spontanous Breathing  Post-op Assessment: Report given to RN and Post -op Vital signs reviewed and stable  Post vital signs: Reviewed and stable  Last Vitals:  Vitals Value Taken Time  BP 107/73 12/28/20 1020  Temp    Pulse    Resp 17 12/28/20 1021  SpO2    Vitals shown include unvalidated device data.  Last Pain:  Vitals:   12/28/20 0752  TempSrc:   PainSc: 5          Complications: No notable events documented.

## 2020-12-28 NOTE — Anesthesia Procedure Notes (Signed)
Procedure Name: MAC Date/Time: 12/28/2020 9:45 AM Performed by: Harden Mo, CRNA Pre-anesthesia Checklist: Patient identified, Emergency Drugs available, Suction available and Patient being monitored Patient Re-evaluated:Patient Re-evaluated prior to induction Oxygen Delivery Method: Simple face mask Preoxygenation: Pre-oxygenation with 100% oxygen Induction Type: IV induction Placement Confirmation: positive ETCO2 and breath sounds checked- equal and bilateral Dental Injury: Teeth and Oropharynx as per pre-operative assessment

## 2020-12-28 NOTE — Progress Notes (Signed)
Pt. Has a cunningham clamp on his penis. Penis is red and swollen. Pt. Removed the clamp and a condom catheter placed.

## 2020-12-28 NOTE — Progress Notes (Signed)
Patient stand pivot up to bedside commode for bowel moment with nurse assistance. Following getting back into bed, surgical dressing noted to have blood seeping through dressing, aprox. Palm sized amount. Dr. Donzetta Matters paged and aware. Verbal order given to reinforce existing dressing.

## 2020-12-28 NOTE — Interval H&P Note (Signed)
History and Physical Interval Note:  12/28/2020 8:45 AM  Lance Dillon  has presented today for surgery, with the diagnosis of Ischemic Right Toe.  The various methods of treatment have been discussed with the patient and family. After consideration of risks, benefits and other options for treatment, the patient has consented to  Procedure(s): RIGHT 4TH TOE AMPUTATION (Right) as a surgical intervention.  The patient's history has been reviewed, patient examined, no change in status, stable for surgery.  I have reviewed the patient's chart and labs.  Questions were answered to the patient's satisfaction.     [name]

## 2020-12-28 NOTE — Progress Notes (Signed)
Initial Nutrition Assessment  DOCUMENTATION CODES:   Non-severe (moderate) malnutrition in context of chronic illness  INTERVENTION:   Multivitamin w/ minerals daily Recommend liberalizing pt diet to regular due to malnutrition. MD messaged. Encourage good PO intake Ensure Enlive po BID, each supplement provides 350 kcal and 20 grams of protein  NUTRITION DIAGNOSIS:   Moderate Malnutrition related to chronic illness (COPD, cancer) as evidenced by moderate muscle depletion, mild fat depletion.  GOAL:   Patient will meet greater than or equal to 90% of their needs  MONITOR:   PO intake, Supplement acceptance, Labs, Weight trends  REASON FOR ASSESSMENT:   Consult Wound healing  ASSESSMENT:   80 y.o. male presented for the amputation of his R 4th toe. Pt recently admitted 11/20-11/26 for ischemic R 4th toe and underwent R fem-pop bypass to increase circulation. PMH includes recurrent prostate cancer, PAD, HTN, COPD, and CVA.   Pt family at bedside.   Pt reports that he will eat whatever. Pt reports that they typically only have 2 meals per day, usually a late breakfast or late lunch/dinner. Pt reports that he does not add any salt to his foods and that his wife cooks with very little salt. Pt reports a typical intake of: Breakfast: bacon, egg, toast, grits Lunch: sandwich or stewed potatoes with cornbread  Pt reports that his UBW is 160#, denies any recent weight loss. Per EMR, pt has had a 7% weight loss within 1 day; suspect that current weight in chart is a stated weight. Pt reports that prior to admission last week, he was using a cane to ambulate; pt now has been using a walker due to increased weakness in legs. 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  Discussed ONS with pt; pt agreed to Ensure (chocolate) to increase calories and protein for wound healing. Discussed that he could continue drinking after discharge to help with missed  meals.  Medications reviewed and include: Colace, IV antibiotics Labs reviewed: BUN 31  NUTRITION - FOCUSED PHYSICAL EXAM:  Flowsheet Row Most Recent Value  Orbital Region Mild depletion  Upper Arm Region No depletion  Thoracic and Lumbar Region No depletion  Buccal Region Mild depletion  Temple Region Mild depletion  Clavicle Bone Region Moderate depletion  Clavicle and Acromion Bone Region Moderate depletion  Scapular Bone Region Moderate depletion  Dorsal Hand Moderate depletion  Patellar Region Moderate depletion  Anterior Thigh Region Moderate depletion  Posterior Calf Region Moderate depletion  Edema (RD Assessment) None  Hair Reviewed  Eyes Reviewed  Mouth Reviewed  Skin Reviewed  Nails Reviewed       Diet Order:   Diet Order             Diet Heart Room service appropriate? Yes; Fluid consistency: Thin  Diet effective now                   EDUCATION NEEDS:   No education needs have been identified at this time  Skin:  Skin Assessment: Skin Integrity Issues: Skin Integrity Issues:: Incisions Incisions: R foot  Last BM:  Prior to Admission  Height:   Ht Readings from Last 1 Encounters:  12/28/20 5\' 7"  (1.702 m)    Weight:   Wt Readings from Last 1 Encounters:  12/28/20 72.6 kg    Ideal Body Weight:  67.3 kg  BMI:  Body mass index is 25.06 kg/m.  Estimated Nutritional Needs:   Kcal:  1800-2000  Protein:  90-105 grams  Fluid:  > 1.8 L    Fawzi Melman BS, PLDN Clinical Dietitian See [facility] for contact information.

## 2020-12-28 NOTE — H&P (Signed)
History and Physical    Lance Dillon KVQ:[id number] DOB: April 19, 1940 DOA: 12/28/2020  PCP: Deland Pretty, MD Consultants:  Donzetta Matters - vascular; Gwenlyn Found - cardiology; Wert - pulmonology; Alinda Money - urology Patient coming from:  Home - lives with wife; NOK: Wife, Lance Dillon, [phone]   Chief Complaint: Toe amputation  HPI: Lance Dillon is a 80 y.o. male with medical history significant of PAD; recurrent prostate CA; HTN; HLD; COPD; and CVA who presented for R 4th toe amputation.  He was previously hospitalized from 11/20-26 for an ischemic R fourth toe and he underwent a R fem-pop bypass with the hope that the restored circulation would enable the toe to heal.  He has continued to have pain and progressive necrosis and so returned to vascular surgery yesterday and was scheduled for amputation today.  TRH was asked to admit for ongoing antibiotics, pain control, and assistance with mobilization.    ED Course: Recently admitted for ulcer, had fem-pop bypass.  Returned to the office with ischemic toe.  Needs admission for pain control, antibiotics.    Review of Systems: As per HPI; otherwise review of systems reviewed and negative.    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   Bilateral: Noted on MRA of brain in November 2015 with collateral flow from posterior circulation and external collaterals reconstituting anterior circulation   Chronic kidney disease    Colon polyps 2004, 07, 14   COPD (chronic obstructive pulmonary disease) (HCC)    GERD (gastroesophageal reflux disease)    diet controlled, no meds   Glaucoma    left eye    Hx of radiation therapy 03/22/11 to 05/08/11   PSA recurrent carcinoma of prostate   Hypercholesterolemia    Hypertension     Macular degeneration of both eyes    Peripheral vascular disease ([place]) 07/2011   Bilateral SFA stenosis: Status post right femoropopliteal bypass - Dr. 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 mm x 8 mm fusiform aneurysm    Prostate cancer ([place]) 2004   Renal artery stenosis (HCC)    70% left   Stones in the urinary tract    Stroke [facility])    Urinary incontinence     Past Surgical History:  Procedure Laterality Date   ABDOMINAL AORTAGRAM N/A 06/20/2011   Procedure: ABDOMINAL Maxcine Ham;  Surgeon: Serafina Mitchell, MD;  Location: [facility] CATH LAB;  Service: Cardiovascular;  Laterality: N/A;   ABDOMINAL AORTOGRAM W/LOWER EXTREMITY N/A 12/20/2020   Procedure: ABDOMINAL AORTOGRAM W/LOWER EXTREMITY;  Surgeon: Waynetta Sandy, MD;  Location: [place] CV LAB;  Service: Cardiovascular;  Laterality: N/A;   CARDIOVASCULAR STRESS TEST  07/06/2011   Evidence of mild ischemia in Basal Inferior and Mid Inferior regions, no significant wall motion abnormalities noted.   clamp to penis     for urinary control   COLONOSCOPY     COLONOSCOPY W/ BIOPSIES     multiple    CYSTOSCOPY W/ URETERAL STENT PLACEMENT Left 06/07/2015   Procedure: CYSTOSCOPY, RETROGRADE PYLEGRAM  WITH STENT PLACEMENT;  Surgeon: Bjorn Loser, MD;  Location: [facility] ORS;  Service: Urology;  Laterality: Left;   CYSTOSCOPY WITH RETROGRADE PYELOGRAM, URETEROSCOPY AND STENT PLACEMENT Left 07/28/2015   Procedure: CYSTOSCOPY,  URETEROSCOPY, WITH BASKET STONE REMOVAL;  Surgeon: Raynelle Bring, MD;  Location: [facility] ORS;  Service: Urology;  Laterality: Left;   ENDARTERECTOMY FEMORAL Right 12/21/2020   Procedure: RIGHT FEMORAL ENDARTERECTOMY;  Surgeon: Waynetta Sandy, MD;  Location: [place];  Service: Vascular;  Laterality: Right;   EYE SURGERY Right Aug. 2016   Cataract; removed bilat   FEMORAL-POPLITEAL BYPASS GRAFT  07/19/2011   Procedure: BYPASS GRAFT FEMORAL-POPLITEAL ARTERY;  Surgeon: Mal Misty, MD;   Location: [place];  Service: Vascular;  Laterality: Right;  Right Femoral - Popliteal  Bypss with saphenous vein   FEMORAL-POPLITEAL BYPASS GRAFT Right 12/21/2020   Procedure: RIGHT FEMORAL-POPLITEAL ARTERY BYPASS GRAFT WITH PTFE;  Surgeon: Waynetta Sandy, MD;  Location: [place];  Service: Vascular;  Laterality: Right;   HERNIA REPAIR     right side,lft   INTRAOPERATIVE ARTERIOGRAM  07/19/2011   Procedure: INTRA OPERATIVE ARTERIOGRAM;  Surgeon: Mal Misty, MD;  Location: [place];  Service: Vascular;  Laterality: Right;   NM MYOVIEW LTD  June 2013   Normal EF, very small area of basal-mid inferior ischemia; LOW RISK   PENILE PROSTHESIS  REMOVAL     PENILE PROSTHESIS IMPLANT     PR VEIN BYPASS GRAFT,AORTO-FEM-POP  07/19/11   Right  Fem/Pop BPG   PROSTATE BIOPSY     prostatectomy retropubic radical  07/15/2002   with nerve sparing, Gleason 3+4=7   Reclast  July 25, 2013   RENAL DUPLEX  07/01/2012   Left renal artery 60-99% diameter reduction   TRANSTHORACIC ECHOCARDIOGRAM  January   Normal EF and overall function   TRANSTHORACIC ECHOCARDIOGRAM  02/23/2010   EF >07%, lv systolic function normal    Social History   Socioeconomic History   Marital status: Married    Spouse name: Not on file   Number of children: Not on file   Years of education: Not on file   Highest education level: Not on file  Occupational History   Occupation: Therapist, occupational: BOILER [employer]  Tobacco Use   Smoking status: Former    Packs/day: 3.00    Years: 30.00    Pack years: 90.00    Types: Cigarettes    Quit date: 07/01/2002    Years since quitting: 18.5   Smokeless tobacco: Never  Vaping Use   Vaping Use: Never used  Substance and Sexual Activity   Alcohol use: No   Drug use: No   Sexual activity: Not Currently  Other Topics Concern   Not on file  Social History Narrative   Married, 2 children , Oncologist - predominantly commercial, 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   Usually walks 2 miles maybe 2-3 days a week. Just not a cold weather.   He is a former smoker, quit "cold [place] "after 3 pack per day for 60 pack years in 2004   Social Determinants of Health   Financial Resource Strain: Not on file  Food Insecurity: Not on file  Transportation Needs: Not on file  Physical Activity: Not on file  Stress: Not on file  Social Connections: Not on file  Intimate Partner Violence: Not on file    Allergies  Allergen Reactions   Avelox [Moxifloxacin Hcl In Nacl] Diarrhea   Moxifloxacin Diarrhea   Cilostazol Diarrhea    Family History  Problem Relation Age of Onset   Prostate cancer Brother 19   Heart disease Brother  Before age 79   Hypertension Brother    Heart attack Brother    Aneurysm Father    Heart disease Father    Hypertension Father    Heart disease Mother        Before age 19   Hypertension Mother    Heart attack Mother    Heart disease Sister    Hypertension Sister    Heart attack Sister    Colon cancer Neg Hx    Esophageal cancer Neg Hx    Stomach cancer Neg Hx    Rectal cancer Neg Hx    Colon polyps Neg Hx     Prior to Admission medications   Medication Sig Start Date End Date Taking? Authorizing Provider  acetaminophen (TYLENOL) 500 MG tablet Take 500 mg by mouth every 6 (six) hours as needed for mild pain or headache.   Yes [provider]  amLODipine (NORVASC) 5 MG tablet Take 5 mg by mouth at bedtime.   Yes [provider]  aspirin 81 MG tablet Take 81 mg by mouth at bedtime.   Yes [provider]  budesonide-formoterol (SYMBICORT) 80-4.5 MCG/ACT inhaler Inhale 2 puffs into the lungs 2 (two) times daily as needed. 04/23/20  Yes Martyn Ehrich, NP  Calcium Carbonate-Vitamin D (CALTRATE 600+D PO) Take 1 tablet by mouth daily.    Yes [provider]  cefdinir (OMNICEF) 300 MG capsule Take 2 capsules (600 mg total) by mouth daily for 5 days. 12/26/20 12/31/20  Yes Dagoberto Ligas, PA-C  Cyanocobalamin (VITAMIN B-12) 2500 MCG SUBL Place 2,500 mcg under the tongue daily.   Yes [provider]  docusate sodium (COLACE) 100 MG capsule Take 1 capsule (100 mg total) by mouth daily as needed for mild constipation. 12/25/20  Yes Pokhrel, Corrie Mckusick, MD  doxycycline (VIBRA-TABS) 100 MG tablet Take 1 tablet (100 mg total) by mouth 2 (two) times daily for 5 days. 12/25/20 12/30/20 Yes Pokhrel, Corrie Mckusick, MD  fluticasone (FLONASE) 50 MCG/ACT nasal spray Place 2 sprays into both nostrils daily as needed for rhinitis or allergies (or nasal congestion). 09/02/12  Yes [provider]  HYDROcodone-acetaminophen (NORCO/VICODIN) 5-325 MG tablet Take 1 tablet by mouth every 6 (six) hours as needed for moderate pain. 12/25/20  Yes Dagoberto Ligas, PA-C  latanoprost (XALATAN) 0.005 % ophthalmic solution Place 1 drop into the left eye at bedtime.  09/18/13  Yes [provider]  losartan-hydrochlorothiazide (HYZAAR) 100-12.5 MG tablet Take 1 tablet by mouth at bedtime.   Yes [provider]  LUTEIN PO Take 1 capsule by mouth daily.   Yes [provider]  Omega-3 Fatty Acids (FISH OIL) 1200 MG CAPS Take 3,600 mg by mouth daily.   Yes [provider]  rosuvastatin (CRESTOR) 10 MG tablet TAKE 1 TABLET BY MOUTH EVERY DAY Patient taking differently: Take 10 mg by mouth at bedtime. 10/12/20  Yes Lorretta Harp, MD  metoprolol succinate (TOPROL-XL) 25 MG 24 hr tablet TAKE 1 TABLET (25 MG TOTAL) BY MOUTH DAILY. ** DO NOT CRUSH ** (BETA BLOCKER) Patient taking differently: Take 25 mg by mouth See admin instructions. Take 25 mg by mouth at bedtime ** DO NOT CRUSH **    (BETA BLOCKER) 04/20/20   Lorretta Harp, MD    Physical Exam: Vitals:   12/28/20 1205 12/28/20 1220 12/28/20 1235 12/28/20 1249  BP: 116/64  118/69 115/60  Pulse: 82 87 (!) 103 95  Resp: 15 (!) 24 18 17   Temp:  TempSrc:      SpO2: 100% 100% 100% 100%  Weight:       Height:         General:  Appears calm and comfortable and is in NAD Eyes:   EOMI, normal lids, iris ENT:  grossly normal hearing, lips & tongue, mmm Neck:  no LAD, masses or thyromegaly Cardiovascular:  RRR, no m/r/g. No LE edema.  Respiratory:   CTA bilaterally with no wheezes/rales/rhonchi.  Normal respiratory effort. Abdomen:  soft, NT, ND Skin:  no rash or induration seen on limited exam Musculoskeletal:  grossly normal tone BUE/BLE, RLE with nerve block in place and bandage that is C/D/I Psychiatric:  grossly normal mood and affect, speech fluent and appropriate, AOx3 Neurologic:  CN 2-12 grossly intact   Radiological Exams on Admission: Independently reviewed - see discussion in A/P where applicable  No results found.  EKG: not done   Labs on Admission: I have personally reviewed the available labs and imaging studies at the time of the admission.  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 The patient has been fully vaccinated against COVID-19.   Level of care: Med-Surg DVT prophylaxis:  Lovenox  Code Status:  Full - confirmed with  patient Family Communication: None present Disposition Plan:  The patient is from: home  Anticipated d/c is to: home, possibly with [facility] services  Anticipated d/c date will depend on clinical response to treatment, likely 2-3 days  Patient is currently: acutely ill Consults called: Vascular surgery; peripheral vascular navigator; TOC team; wound care; PT/OT; and nutrition  Admission status:  Admit - It is my clinical opinion that admission to [place] is reasonable and necessary because of the expectation that this patient will require hospital care that crosses at least 2 midnights to treat this condition based on the medical complexity of the problems presented.  Given the aforementioned information, the predictability of an adverse outcome is felt to be significant.    Karmen Bongo MD [facility]   How to contact the [facility] Attending or Consulting provider [place] or covering provider during after hours [place], for this patient?  Check the care team in [facility] and look for a) attending/consulting TRH provider listed and b) the [facility] team listed Log into [email or web address] and use [place]'s universal password to access. If you do not have the password, please contact the hospital operator. Locate the [facility] provider you are looking for under [facility] and page to a number that you can be directly reached. If you still have difficulty reaching the provider, please page the [facility] (Director on Call) for the Hospitalists listed on amion for assistance.   12/28/2020, 1:07 PM

## 2020-12-29 ENCOUNTER — Encounter (HOSPITAL_COMMUNITY): Payer: Self-pay | Admitting: Vascular Surgery

## 2020-12-29 DIAGNOSIS — Z89421 Acquired absence of other right toe(s): Secondary | ICD-10-CM

## 2020-12-29 DIAGNOSIS — E440 Moderate protein-calorie malnutrition: Secondary | ICD-10-CM | POA: Insufficient documentation

## 2020-12-29 LAB — CBC
HCT: 23.7 % — ABNORMAL LOW (ref 39.0–52.0)
Hemoglobin: 7.7 g/dL — ABNORMAL LOW (ref 13.0–17.0)
MCH: 31.7 pg (ref 26.0–34.0)
MCHC: 32.5 g/dL (ref 30.0–36.0)
MCV: 97.5 fL (ref 80.0–100.0)
Platelets: 168 10*3/uL (ref 150–400)
RBC: 2.43 MIL/uL — ABNORMAL LOW (ref 4.22–5.81)
RDW: 13.7 % (ref 11.5–15.5)
WBC: 4.1 10*3/uL (ref 4.0–10.5)
nRBC: 0.5 % — ABNORMAL HIGH (ref 0.0–0.2)

## 2020-12-29 LAB — BASIC METABOLIC PANEL
Anion gap: 5 (ref 5–15)
BUN: 27 mg/dL — ABNORMAL HIGH (ref 8–23)
CO2: 27 mmol/L (ref 22–32)
Calcium: 8.1 mg/dL — ABNORMAL LOW (ref 8.9–10.3)
Chloride: 101 mmol/L (ref 98–111)
Creatinine, Ser: 1.36 mg/dL — ABNORMAL HIGH (ref 0.61–1.24)
GFR, Estimated: 53 mL/min — ABNORMAL LOW (ref >60)
Glucose, Bld: 113 mg/dL — ABNORMAL HIGH (ref 70–99)
Potassium: 3.8 mmol/L (ref 3.5–5.1)
Sodium: 133 mmol/L — ABNORMAL LOW (ref 135–145)

## 2020-12-29 MED ORDER — TRIAMCINOLONE 0.1 % CREAM:EUCERIN CREAM 1:1
TOPICAL_CREAM | Freq: Two times a day (BID) | CUTANEOUS | Status: DC
  Filled 2020-12-29 (×2): qty 1

## 2020-12-29 NOTE — Evaluation (Signed)
Occupational Therapy Evaluation Patient Details Name: Lance Dillon MRN: [id number] DOB: January 20, 1941 Today's Date: 12/29/2020   History of Present Illness Pt is an 80 y.o. male admitted on 12/19/20 with an infected R foot ulcer. LE angiogram performed on 11/21 and femoral to below knee popliteal bypass 11/22. S/p fourth toe ray amputation on 11/29. PMH includes HTN, heart murmur, COPD, atherosclerosis, renal artery stenosis, blindness in right eye, macular degeneration, right femoral to popliteal bypass grafts (2013).   Clinical Impression   Pt independent with ADLs at baseline, reports using AD for mobility. Pt independent - min A with ADLs, limited by 8/10 pain during session. Requires supervision for bed mobility, min guard for transfers. Educated pt on WB precautions, per updated MD note pt now WBAT in RLE heel only. Pt verbalized understanding, able to adhere to precautions throughout session with post-op shoe donned. Pt limited by decreased activity tolerance, balance, and pain at this time. Will continue to follow acutely to maximize safety with ADLs and functional mobility. Recommend d/c home with [facility] and family assistance.     Recommendations for follow up therapy are one component of a multi-disciplinary discharge planning process, led by the attending physician.  Recommendations may be updated based on patient status, additional functional criteria and insurance authorization.   Follow Up Recommendations  Home health OT    Assistance Recommended at Discharge Intermittent Supervision/Assistance  Functional Status Assessment  Patient has had a recent decline in their functional status and demonstrates the ability to make significant improvements in function in a reasonable and predictable amount of time.  Equipment Recommendations  Other (comment);None recommended by OT (Pt has all DME)    Recommendations for Other Services PT consult     Precautions / Restrictions  Precautions Precautions: Fall Restrictions Weight Bearing Restrictions: Yes RLE Weight Bearing: Weight bearing as tolerated Other Position/Activity Restrictions: WBAT in shoe, heel only (given after PT saw pt)      Mobility Bed Mobility Overal bed mobility: Needs Assistance Bed Mobility: Sit to Supine       Sit to supine: Supervision;HOB elevated        Transfers Overall transfer level: Needs assistance Equipment used: Rolling walker (2 wheels) Transfers: Sit to/from Stand;Bed to chair/wheelchair/BSC Sit to Stand: Min guard     Step pivot transfers: Min guard     General transfer comment: increased time and v/cing for WB precautions      Balance Overall balance assessment: Needs assistance Sitting-balance support: No upper extremity supported;Feet supported Sitting balance-Leahy Scale: Normal Sitting balance - Comments: Pt able to sit EOB and weight shift with no physical assist   Standing balance support: Bilateral upper extremity supported;Reliant on assistive device for balance;During functional activity Standing balance-Leahy Scale: Poor Standing balance comment: reliant on UE support to offload painful RLE and maintain balance during static standing and ambulation                           ADL either performed or assessed with clinical judgement   ADL Overall ADL's : Needs assistance/impaired Eating/Feeding: Independent;Sitting   Grooming: Independent;Sitting   Upper Body Bathing: Minimal assistance;Sitting   Lower Body Bathing: Minimal assistance;Sitting/lateral leans   Upper Body Dressing : Supervision/safety;Sitting   Lower Body Dressing: Minimal assistance;Sit to/from stand Lower Body Dressing Details (indicate cue type and reason): able to doff post-op shoe sitting EOB Toilet Transfer: Min Sales executive Details (indicate cue type and reason): simulated to chair Toileting-  Clothing  Manipulation and Hygiene: Min guard;Sitting/lateral lean       Functional mobility during ADLs: Min guard;Rolling walker (2 wheels) General ADL Comments: pt limited by pain during session     Vision Baseline Vision/History: 1 Wears glasses;6 Macular Degeneration Vision Assessment?: No apparent visual deficits     Perception     Praxis      Pertinent Vitals/Pain Pain Assessment: Faces Pain Score: 8  Faces Pain Scale: Hurts little more Pain Location: R LE with mobility Pain Descriptors / Indicators: Burning;Sharp Pain Intervention(s): Limited activity within patient's tolerance;Monitored during session;Patient requesting pain meds-RN notified;Repositioned     Hand Dominance     Extremity/Trunk Assessment Upper Extremity Assessment Upper Extremity Assessment: Overall WFL for tasks assessed   Lower Extremity Assessment Lower Extremity Assessment: Defer to PT evaluation   Cervical / Trunk Assessment Cervical / Trunk Assessment: Normal   Communication Communication Communication: No difficulties   Cognition Arousal/Alertness: Awake/alert Behavior During Therapy: WFL for tasks assessed/performed Overall Cognitive Status: Within Functional Limits for tasks assessed                                 General Comments: Eager to mobilize with therapy     General Comments  spouse present in room throughout session    Exercises     Shoulder Instructions      Home Living Family/patient expects to be discharged to:: Private residence Living Arrangements: Spouse/significant other Available Help at Discharge: Family;Available 24 hours/day Type of Home: House Home Access: Stairs to enter [employer] of Steps: 2 Entrance Stairs-Rails: Right;Left Home Layout: One level     Bathroom Shower/Tub: Teacher, early years/pre: Handicapped height Bathroom Accessibility: Yes How Accessible: Accessible via walker Home Equipment: Cane - single  point;Rolling Walker (2 wheels);BSC/3in1;Tub bench          Prior Functioning/Environment Prior Level of Function : Independent/Modified Independent;Working/employed             Mobility Comments: use of cane in the home and for short distances, use of Rollator outside of the home. Does not like to use rollator inside because it does not fit well. Denies any falls in past year.          OT Problem List: Decreased strength;Decreased activity tolerance;Impaired balance (sitting and/or standing);Decreased knowledge of use of DME or AE;Pain      OT Treatment/Interventions: Self-care/ADL training;Therapeutic exercise;DME and/or AE instruction;Therapeutic activities;Patient/family education;Balance training    OT Goals(Current goals can be found in the care plan section) Acute Rehab OT Goals Patient Stated Goal: return home OT Goal Formulation: With patient Time For Goal Achievement: 01/06/21 Potential to Achieve Goals: Good ADL Goals Pt Will Perform Upper Body Dressing: Independently;sitting Pt Will Perform Lower Body Dressing: with supervision;sit to/from stand Pt Will Transfer to Toilet: with supervision;ambulating;regular height toilet Pt Will Perform Tub/Shower Transfer: tub bench;with supervision;rolling walker  OT Frequency: Min 2X/week   Barriers to D/C:            Co-evaluation              AM-PAC OT "6 Clicks" Daily Activity     Outcome Measure Help from another person eating meals?: None Help from another person taking care of personal grooming?: None Help from another person toileting, which includes using toliet, bedpan, or urinal?: A Little Help from another person bathing (including washing, rinsing, drying)?: A Lot Help from another person to  put on and taking off regular upper body clothing?: A Little Help from another person to put on and taking off regular lower body clothing?: A Little 6 Click Score: 19   End of Session Equipment Utilized During  Treatment: Gait belt;Rolling walker (2 wheels) Nurse Communication: Mobility status;Patient requests pain meds;Other (comment) (RN notified of rash on pt's back)  Activity Tolerance: Patient limited by pain Patient left: in bed;with call bell/phone within reach;with bed alarm set;with nursing/sitter in room;with family/visitor present  OT Visit Diagnosis: Unsteadiness on feet (R26.81);Other abnormalities of gait and mobility (R26.89);Pain Pain - Right/Left: Right Pain - part of body: Leg                Time: 3546-5681 OT Time Calculation (min): 28 min Charges:  OT General Charges $OT Visit: 1 Visit OT Evaluation $OT Eval Low Complexity: 1 Low OT Treatments $Self Care/Home Management : 8-22 mins  Lynnda Child, OTD, OTR/L Acute Rehab [phone] - [place] 12/29/2020, 3:19 PM

## 2020-12-29 NOTE — Evaluation (Signed)
Physical Therapy Evaluation Patient Details Name: Lance Dillon MRN: [id number] DOB: [birth year] Today's Date: 12/29/2020  History of Present Illness  Pt is an 80 y.o. male admitted on 12/19/20 with an infected R foot ulcer. LE angiogram performed on 11/21 and femoral to below knee popliteal bypass 11/22. S/p fourth toe ray amputation on 11/29. PMH includes HTN, heart murmur, COPD, atherosclerosis, renal artery stenosis, blindness in right eye, macular degeneration, right femoral to popliteal bypass grafts (2013).  Clinical Impression  Pt was seen for readmission for his surgery to the R foot after being home only one day.  Has permission to wb on R heel in ortho shoe, but did not receive this until after seeing pt.  Will progress him with gait and stair training to allow transition home, as he was just there with wife and will anticipate his success in returning there.  Follow up with therapy as was originally planned, to focus on geography of home that presents a fall risk, and to instruct pt on his exercises and safe transfers as are permitted with his new surgery.  Follow along with him for acute PT goals as are outlined below.     Recommendations for follow up therapy are one component of a multi-disciplinary discharge planning process, led by the attending physician.  Recommendations may be updated based on patient status, additional functional criteria and insurance authorization.  Follow Up Recommendations Home health PT    Assistance Recommended at Discharge Intermittent Supervision/Assistance  Functional Status Assessment Patient has had a recent decline in their functional status and demonstrates the ability to make significant improvements in function in a reasonable and predictable amount of time.  Equipment Recommendations  None recommended by PT    Recommendations for Other Services       Precautions / Restrictions Precautions Precautions: Fall Restrictions Weight Bearing  Restrictions: Yes RLE Weight Bearing: Weight bearing as tolerated Other Position/Activity Restrictions: WBAT in shoe, heel only      Mobility  Bed Mobility Overal bed mobility: Needs Assistance Bed Mobility: Supine to Sit     Supine to sit: Min guard Sit to supine: Supervision;HOB elevated   General bed mobility comments: min guard to sit up with pt usign bed rail    Transfers Overall transfer level: Needs assistance Equipment used: Rolling walker (2 wheels) Transfers: Sit to/from Stand;Bed to chair/wheelchair/BSC Sit to Stand: Min guard Stand pivot transfers: Min guard Step pivot transfers: Min guard       General transfer comment: pt is able to keep foot NWB    Ambulation/Gait Ambulation/Gait assistance: Min guard Gait Distance (Feet): 4 Feet Assistive device: Rolling walker (2 wheels)   Gait velocity: decreased Gait velocity interpretation: <1.31 ft/sec, indicative of household ambulator   General Gait Details: Pt was recently in hosp but did not have updated order for WB on RLE.  Had gone home only one day with wife before returning to hosp for surgery  Stairs            Wheelchair Mobility    Modified Rankin (Stroke Patients Only)       Balance Overall balance assessment: Needs assistance Sitting-balance support: Single extremity supported Sitting balance-Leahy Scale: Good Sitting balance - Comments: Pt able to sit EOB and weight shift with no physical assist   Standing balance support: Bilateral upper extremity supported;During functional activity Standing balance-Leahy Scale: Poor Standing balance comment: steadying on walker but maintaining NWB on R foot  Pertinent Vitals/Pain Pain Assessment: Faces Pain Score: 8  Faces Pain Scale: Hurts little more Pain Location: R foot Pain Descriptors / Indicators: Grimacing;Guarding Pain Intervention(s): Monitored during session;Repositioned    Home Living  Family/patient expects to be discharged to:: Private residence Living Arrangements: Spouse/significant other Available Help at Discharge: Family;Available 24 hours/day Type of Home: House Home Access: Stairs to enter Entrance Stairs-Rails: Psychiatric nurse of Steps: 2   Home Layout: One level Home Equipment: Cane - single Barista (2 wheels);BSC/3in1;Tub bench Additional Comments: WBAT on heel given after PT saw pt    Prior Function Prior Level of Function : Independent/Modified Independent             Mobility Comments: SPC prior to having vascular surgery ADLs Comments: pt has been working a lighter job, using rollator to go out of the house     [employer]        Extremity/Trunk Assessment   Upper Extremity Assessment Upper Extremity Assessment: Defer to OT evaluation    Lower Extremity Assessment Lower Extremity Assessment: RLE deficits/detail RLE Deficits / Details: Pt is NWB on RLE with foot bandaged, limited to 70 deg flexion on knee RLE: Unable to fully assess due to immobilization RLE Coordination: decreased gross motor    Cervical / Trunk Assessment Cervical / Trunk Assessment: Normal  Communication   Communication: No difficulties  Cognition Arousal/Alertness: Awake/alert Behavior During Therapy: WFL for tasks assessed/performed Overall Cognitive Status: Within Functional Limits for tasks assessed                                 General Comments: pt wanting to be up in chair and excited he did not have much pain to get there        General Comments General comments (skin integrity, edema, etc.): wife able to give history details in addition to husband    Exercises     Assessment/Plan    PT Assessment Patient needs continued PT services  PT Problem List Decreased strength;Decreased range of motion;Decreased activity tolerance;Decreased balance;Decreased mobility;Decreased coordination;Decreased  knowledge of use of DME;Decreased skin integrity;Pain       PT Treatment Interventions DME instruction;Gait training;Functional mobility training;Stair training;Therapeutic activities;Therapeutic exercise;Balance training;Neuromuscular re-education;Patient/family education    PT Goals (Current goals can be found in the Care Plan section)  Acute Rehab PT Goals Patient Stated Goal: to get back to working PT Goal Formulation: With patient Time For Goal Achievement: 01/05/21 Potential to Achieve Goals: Good    Frequency Min 3X/week   Barriers to discharge Inaccessible home environment;Decreased caregiver support home with wife and has steps to enter house    Co-evaluation               AM-PAC PT "6 Clicks" Mobility  Outcome Measure Help needed turning from your back to your side while in a flat bed without using bedrails?: None Help needed moving from lying on your back to sitting on the side of a flat bed without using bedrails?: None Help needed moving to and from a bed to a chair (including a wheelchair)?: A Little Help needed standing up from a chair using your arms (e.g., wheelchair or bedside chair)?: A Little Help needed to walk in hospital room?: A Little Help needed climbing 3-5 steps with a railing? : A Lot 6 Click Score: 19    End of Session Equipment Utilized During Treatment: Gait belt Activity Tolerance: Treatment limited secondary  to medical complications (Comment) (NWB limitation) Patient left: with call bell/phone within reach;in chair;with chair alarm set;with family/visitor present Nurse Communication: Mobility status PT Visit Diagnosis: Unsteadiness on feet (R26.81);Muscle weakness (generalized) (M62.81);Difficulty in walking, not elsewhere classified (R26.2) Pain - Right/Left: Right Pain - part of body: Ankle and joints of foot    Time: 1320-1349 PT Time Calculation (min) (ACUTE ONLY): 29 min   Charges:   PT Evaluation $PT Eval Moderate Complexity:  1 Mod PT Treatments $Therapeutic Activity: 8-22 mins       Ramond Dial 12/29/2020, 5:03 PM  Mee Hives, PT PhD Acute Rehab Dept. Number: [place] [facility] [place]

## 2020-12-29 NOTE — Progress Notes (Signed)
PROGRESS NOTE    Lance Dillon  CHY:[id number] DOB: March 10, 1940 DOA: 12/28/2020 PCP: Deland Pretty, MD   Brief Narrative: 80 year old with past medical history significant for PAD, recurrent prostate cancer, hypertension, hyperlipidemia, COPD, CVA who presented for right fourth toe amputation.  He was previously hospitalized from 11/20 oh until 26 for an ischemic right fourth toe and he underwent right femoral pop bypass with the hope that restore circulation will enable the toe to heal.  He has continued to have pain and progressive necrosis so return to vascular surgery for follow-up and was a scheduled for amputation on 11/29.  TRH was asked to admit for ongoing antibiotics, pain control and assistance with mobilization.    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  Hold outpatient cefdinir and doxycycline. -Vascular following, PT OT per vascular On aspirin  2-Hypertension: Continue with Norvasc and Toprol.  Hold losartan and hydrochlorothiazide  HLD: Continue with Crestor  COPD continue with Symbicort  Glaucoma continue with latanoprost Rash on the back:started on Eucerin/triamcinolone  Mild Hyponatremia: Monitor Anemia, blood loss postsurgery monitor Check anemia panel  Nutrition Problem: Moderate Malnutrition Etiology: chronic illness (COPD, cancer)    Signs/Symptoms: moderate muscle depletion, mild fat depletion    Interventions: MVI, Liberalize Diet, Ensure Enlive (each supplement provides 350kcal and 20 grams of protein)  Estimated body mass index is 25.06 kg/m as calculated from the following:   Height as of this encounter: 5\' 7"  (1.702 m).   Weight as  of this encounter: 72.6 kg.   DVT prophylaxis: Lovenox Code Status: Code Family Communication: Care discussed with patient and wife is at bedside Disposition Plan:  Status is: Inpatient  Remains inpatient appropriate because: For fourth toe amputation        Consultants:  Vascular  Procedures:  4 toe amputation   Antimicrobials:    Subjective: He is alert and oriented, reports her pain is controlled Complaining of rash in the back  objective: Vitals:   12/28/20 2025 12/29/20 0007 12/29/20 0557 12/29/20 0919  BP: 108/62 100/64 104/63 100/64  Pulse: 82 71 64 73  Resp: 18 18 18 18   Temp: 98.2 F (36.8 C) 97.9 F (36.6 C) 98 F (36.7 C) 97.7 F (36.5 C)  TempSrc: Oral Oral Oral Oral  SpO2: 97% 100% 97% 99%  Weight:      Height:        Intake/Output Summary (Last 24 hours) at 12/29/2020 1542 Last data filed at 12/29/2020 0800 Gross per 24 hour  Intake 1241.96 ml  Output 1050 ml  Net 191.96 ml   Filed Weights   12/27/20 1337 12/28/20 0716  Weight: 72.6 kg 72.6 kg    Examination:  General exam: Appears calm and comfortable  Respiratory system: Clear to auscultation. Respiratory effort normal. Cardiovascular system: S1 & S2 heard, RRR.  Gastrointestinal system: Abdomen is nondistended, soft and nontender. No organomegaly or masses felt. Normal bowel sounds heard. Central nervous system: And oriented, report pain is controlled and oriented. No focal neurological deficits. Extremities: Symmetric 5 x 5 power. Skin: Mild erythema, appears like eczema  Data Reviewed: I have personally reviewed following labs and imaging studies  CBC: Recent Labs  Lab 12/23/20 0112 12/24/20 0206 12/28/20 8786  12/29/20 0135  WBC 9.4 7.5  --  4.1  HGB 8.8* 9.0* 9.5* 7.7*  HCT 26.5* 27.5* 28.0* 23.7*  MCV 93.3 94.8  --  97.5  PLT 125* 143*  --  672   Basic Metabolic Panel: Recent Labs  Lab 12/23/20 0112 12/24/20 0206 12/28/20 0751 12/29/20 0135  NA 134* 133*  136 133*  K 3.9 4.2 4.5 3.8  CL 106 102 102 101  CO2 23 24  --  27  GLUCOSE 119* 109* 114* 113*  BUN 19 17 31* 27*  CREATININE 1.17 1.26* 1.40* 1.36*  CALCIUM 7.8* 7.8*  --  8.1*  MG  --  1.9  --   --    GFR: Estimated Creatinine Clearance: 40.5 mL/min (A) (by C-G formula based on SCr of 1.36 mg/dL (H)). Liver Function Tests: No results for input(s): AST, ALT, ALKPHOS, BILITOT, PROT, ALBUMIN in the last 168 hours. No results for input(s): LIPASE, AMYLASE in the last 168 hours. No results for input(s): AMMONIA in the last 168 hours. Coagulation Profile: No results for input(s): INR, PROTIME in the last 168 hours. Cardiac Enzymes: No results for input(s): CKTOTAL, CKMB, CKMBINDEX, TROPONINI in the last 168 hours. BNP (last 3 results) No results for input(s): PROBNP in the last 8760 hours. HbA1C: No results for input(s): HGBA1C in the last 72 hours. CBG: No results for input(s): GLUCAP in the last 168 hours. Lipid Profile: No results for input(s): CHOL, HDL, LDLCALC, TRIG, CHOLHDL, LDLDIRECT in the last 72 hours. Thyroid Function Tests: No results for input(s): TSH, T4TOTAL, FREET4, T3FREE, THYROIDAB in the last 72 hours. Anemia Panel: No results for input(s): VITAMINB12, FOLATE, FERRITIN, TIBC, IRON, RETICCTPCT in the last 72 hours. Sepsis Labs: No results for input(s): PROCALCITON, LATICACIDVEN in the last 168 hours.  Recent Results (from the past 240 hour(s))  Surgical pcr screen     Status: Abnormal   Collection Time: 12/21/20 10:30 AM  Result Value Ref Range Status   MRSA, PCR NEGATIVE NEGATIVE Final   Staphylococcus aureus POSITIVE (A) NEGATIVE Final    Comment: (NOTE) The Xpert SA Assay (FDA approved for NASAL specimens in patients 33 years of age and older), is one component of a comprehensive surveillance program. It is not intended to diagnose infection nor to guide or monitor treatment. Performed at [facility], [place] [address] [place],  [place] [address]   SARS Coronavirus 2 by RT PCR (hospital order, performed in [facility] hospital lab) Nasopharyngeal Nasopharyngeal Swab     Status: None   Collection Time: 12/28/20  7:00 AM   Specimen: Nasopharyngeal Swab  Result Value Ref Range Status   SARS Coronavirus 2 NEGATIVE NEGATIVE Final    Comment: (NOTE) SARS-CoV-2 target nucleic acids are NOT DETECTED.  The SARS-CoV-2 RNA is generally detectable in upper and lower respiratory specimens during the acute phase of infection. The lowest concentration of SARS-CoV-2 viral copies this assay can detect is 250 copies / mL. A negative result does not preclude SARS-CoV-2 infection and should not be used as the sole basis for treatment or other patient management decisions.  A negative result may occur with improper specimen collection / handling, submission of specimen other than nasopharyngeal swab, presence of viral mutation(s) within the areas targeted by this assay, and inadequate number of viral copies (<250 copies / mL). A negative result must be combined with clinical observations, patient history, and epidemiological information.  Fact Sheet for Patients:   StrictlyIdeas.no  Fact Sheet for Healthcare Providers:  BankingDealers.co.za  This test is not yet approved or  cleared by the [place] and has been authorized for detection and/or diagnosis of SARS-CoV-2 by FDA under an Emergency Use Authorization (EUA).  This EUA will remain in effect (meaning this test can be used) for the duration of the COVID-19 declaration under Section 564(b)(1) of the Act, 21 U.S.C. section 360bbb-3(b)(1), unless the authorization is terminated or revoked sooner.  Performed at [facility], [place] [address] [place], [place] [address]          Radiology Studies: No results found.      Scheduled Meds:  amLODipine  5 mg Oral QHS   aspirin EC  81 mg Oral QHS   docusate sodium   100 mg Oral BID   enoxaparin (LOVENOX) injection  40 mg Subcutaneous Q24H   feeding supplement  237 mL Oral BID BM   latanoprost  1 drop Left Eye QHS   metoprolol succinate  25 mg Oral QHS   mometasone-formoterol  2 puff Inhalation BID   multivitamin with minerals  1 tablet Oral Daily   nicotine  14 mg Transdermal Daily   rosuvastatin  10 mg Oral QHS   triamcinolone 0.1 % cream : eucerin   Topical BID   Continuous Infusions:  sodium chloride 75 mL/hr at 12/28/20 1840   cefTRIAXone (ROCEPHIN)  IV Stopped (12/28/20 1741)   And   metronidazole 500 mg (12/29/20 1454)     LOS: 1 day    Time spent: 35 minutes    Aivy Akter A Erma Raiche, MD [facility]   If 7PM-7AM, please contact night-coverage [email or web address]  12/29/2020, 3:42 PM

## 2020-12-29 NOTE — Progress Notes (Signed)
PT Cancellation Note  Patient Details Name: Lance Dillon MRN: [id number] DOB: Jun 14, 1940   Cancelled Treatment:    Reason Eval/Treat Not Completed: Other (comment).  Awaiting WB from surgeon.  Follow up as pt and time allow.   Ramond Dial 12/29/2020, 12:30 PM  Mee Hives, PT PhD Acute Rehab Dept. Number: [place] [facility] [place]

## 2020-12-29 NOTE — Progress Notes (Addendum)
Progress Note    12/29/2020 9:00 AM 1 Day Post-Op  Subjective: Complaining of expected right foot postop pain   Vitals:   12/29/20 0007 12/29/20 0557  BP: 100/64 104/63  Pulse: 71 64  Resp: 18 18  Temp: 97.9 F (36.6 C) 98 F (36.7 C)  SpO2: 100% 97%    Physical Exam: General appearance: Awake, alert in no apparent distress Cardiac: Heart rate and rhythm are regular Respirations: Nonlabored Incisions: Right groin lower leg incisions are all well approximated without bleeding or hematoma.  No signs of infection. Extremities: Both feet are warm with intact sensation and motor function.  Mild to moderate right lower extremity edema.  Right fourth toe amputation site is well approximated without bleeding.  He has some mild residual erythema of the dorsum of the right foot.  Palpable right DP pulse      CBC    Component Value Date/Time   WBC 4.1 12/29/2020 0135   RBC 2.43 (L) 12/29/2020 0135   HGB 7.7 (L) 12/29/2020 0135   HCT 23.7 (L) 12/29/2020 0135   PLT 168 12/29/2020 0135   MCV 97.5 12/29/2020 0135   MCH 31.7 12/29/2020 0135   MCHC 32.5 12/29/2020 0135   RDW 13.7 12/29/2020 0135   LYMPHSABS 1.6 12/19/2020 1128   MONOABS 0.8 12/19/2020 1128   EOSABS 0.2 12/19/2020 1128   BASOSABS 0.1 12/19/2020 1128    BMET    Component Value Date/Time   NA 133 (L) 12/29/2020 0135   K 3.8 12/29/2020 0135   CL 101 12/29/2020 0135   CO2 27 12/29/2020 0135   GLUCOSE 113 (H) 12/29/2020 0135   BUN 27 (H) 12/29/2020 0135   CREATININE 1.36 (H) 12/29/2020 0135   CALCIUM 8.1 (L) 12/29/2020 0135   GFRNONAA 53 (L) 12/29/2020 0135   GFRAA 51 (L) 11/25/2015 1210     Intake/Output Summary (Last 24 hours) at 12/29/2020 0900 Last data filed at 12/29/2020 0558 Gross per 24 hour  Intake 1421.96 ml  Output 810 ml  Net 611.96 ml    HOSPITAL MEDICATIONS Scheduled Meds:  amLODipine  5 mg Oral QHS   aspirin EC  81 mg Oral QHS   docusate sodium  100 mg Oral BID   enoxaparin  (LOVENOX) injection  40 mg Subcutaneous Q24H   feeding supplement  237 mL Oral BID BM   latanoprost  1 drop Left Eye QHS   metoprolol succinate  25 mg Oral QHS   mometasone-formoterol  2 puff Inhalation BID   multivitamin with minerals  1 tablet Oral Daily   nicotine  14 mg Transdermal Daily   rosuvastatin  10 mg Oral QHS   Continuous Infusions:  sodium chloride 75 mL/hr at 12/28/20 1840   cefTRIAXone (ROCEPHIN)  IV Stopped (12/28/20 1741)   And   metronidazole 500 mg (12/29/20 0526)   PRN Meds:.acetaminophen **OR** acetaminophen, bisacodyl, hydrALAZINE, HYDROcodone-acetaminophen, morphine injection, ondansetron **OR** ondansetron (ZOFRAN) IV, polyethylene glycol  Assessment and Plan: POD 1 right fourth toe amputation.  Residual erythema of the dorsum of the right foot.  He remains afebrile.  Incision is well approximated without bleeding.  Dressing replaced.  Postop shoe ordered.  Await PT/OT evaluation, recommendations.   -DVT prophylaxis: Lovenox   Risa Grill, PA-C [facility] [phone] 12/29/2020  9:00 AM   I have independently interviewed and examined patient and agree with PA assessment and plan above. Heel wt bear as tolerated. F/u pt recs.   Neil Brickell C. Donzetta Matters, MD [facility]  [facility] [place] Office: [phone] Pager: [phone]

## 2020-12-29 NOTE — Progress Notes (Signed)
Orthopedic Tech Progress Note Patient Details:  Lance Dillon [birth year] [id number]  Ortho Devices Type of Ortho Device: Postop shoe/boot Ortho Device/Splint Location: Right foot Ortho Device/Splint Interventions: Application   Post Interventions Patient Tolerated: Well Instructions Provided: Adjustment of device  [name] E [name] 12/29/2020, 10:20 AM

## 2020-12-30 DIAGNOSIS — I771 Stricture of artery: Secondary | ICD-10-CM | POA: Diagnosis not present

## 2020-12-30 DIAGNOSIS — I96 Gangrene, not elsewhere classified: Secondary | ICD-10-CM | POA: Diagnosis not present

## 2020-12-30 LAB — CBC
HCT: 25.9 % — ABNORMAL LOW (ref 39.0–52.0)
Hemoglobin: 8.4 g/dL — ABNORMAL LOW (ref 13.0–17.0)
MCH: 31.5 pg (ref 26.0–34.0)
MCHC: 32.4 g/dL (ref 30.0–36.0)
MCV: 97 fL (ref 80.0–100.0)
Platelets: 185 10*3/uL (ref 150–400)
RBC: 2.67 MIL/uL — ABNORMAL LOW (ref 4.22–5.81)
RDW: 13.7 % (ref 11.5–15.5)
WBC: 4.3 10*3/uL (ref 4.0–10.5)
nRBC: 0.5 % — ABNORMAL HIGH (ref 0.0–0.2)

## 2020-12-30 LAB — FOLATE: Folate: 12.5 ng/mL (ref >5.9)

## 2020-12-30 LAB — RETICULOCYTES
Immature Retic Fract: 26.2 % — ABNORMAL HIGH (ref 2.3–15.9)
RBC.: 2.65 MIL/uL — ABNORMAL LOW (ref 4.22–5.81)
Retic Count, Absolute: 131.2 10*3/uL (ref 19.0–186.0)
Retic Ct Pct: 5 % — ABNORMAL HIGH (ref 0.4–3.1)

## 2020-12-30 LAB — IRON AND TIBC
Iron: 86 ug/dL (ref 45–182)
Saturation Ratios: 37 % (ref 17.9–39.5)
TIBC: 231 ug/dL — ABNORMAL LOW (ref 250–450)
UIBC: 145 ug/dL

## 2020-12-30 LAB — FERRITIN: Ferritin: 487 ng/mL — ABNORMAL HIGH (ref 24–336)

## 2020-12-30 LAB — VITAMIN B12: Vitamin B-12: 267 pg/mL (ref 180–914)

## 2020-12-30 MED ORDER — FERROUS SULFATE 325 (65 FE) MG PO TABS: 325.0000 mg | ORAL_TABLET | Freq: Every day | ORAL | 1 refills | Status: DC

## 2020-12-30 MED ORDER — VITAMIN B-12 1000 MCG PO TABS
500.0000 ug | ORAL_TABLET | Freq: Every day | ORAL | Status: DC
  Administered 2020-12-30: 500 ug via ORAL
  Filled 2020-12-30: qty 1

## 2020-12-30 MED ORDER — FERROUS SULFATE 325 (65 FE) MG PO TABS
325.0000 mg | ORAL_TABLET | Freq: Every day | ORAL | Status: DC
  Administered 2020-12-30: 325 mg via ORAL
  Filled 2020-12-30: qty 1

## 2020-12-30 MED ORDER — CYANOCOBALAMIN 500 MCG PO TABS: 500.0000 ug | ORAL_TABLET | Freq: Every day | ORAL | 0 refills | Status: AC

## 2020-12-30 MED ORDER — ADULT MULTIVITAMIN W/MINERALS CH: 1.0000 | ORAL_TABLET | Freq: Every day | ORAL | 0 refills | Status: AC

## 2020-12-30 MED ORDER — NICOTINE 14 MG/24HR TD PT24: 14.0000 mg | MEDICATED_PATCH | Freq: Every day | TRANSDERMAL | 0 refills | Status: DC

## 2020-12-30 NOTE — Discharge Summary (Addendum)
Physician Discharge Summary  Lance Dillon NGE:[id number] DOB: 03/20/1940 DOA: 12/28/2020  PCP: Deland Pretty, MD  Admit date: 12/28/2020 Discharge date: 12/30/2020  Admitted From: Home  Disposition:  Home   Recommendations for Outpatient Follow-up:  Follow up with PCP in 1-2 weeks Please obtain BMP/CBC in one week Please follow up on the following pending results:  Home Health: Yes. PT/OT  Discharge Condition: Stable.  CODE STATUS: Full Code Diet recommendation: Heart Healthy   Brief/Interim Summary: 80 year old with past medical history significant for PAD, recurrent prostate cancer, hypertension, hyperlipidemia, COPD, CVA who presented for right fourth toe amputation.  He was previously hospitalized from 11/20 oh until 26 for an ischemic right fourth toe and he underwent right femoral pop bypass with the hope that restore circulation will enable the toe to heal.  He has continued to have pain and progressive necrosis so return to vascular surgery for follow-up and was a scheduled for amputation on 11/29.  TRH was asked to admit for ongoing antibiotics, pain control and assistance with mobilization.     1-PAD with right fourth toe gangrene: -S/p amputation 11/29 -IV antibiotics ceftriaxone and Flagyl.  Hold outpatient cefdinir and doxycycline. -Vascular following, PT OT per vascular On aspirin  -ok to discharge home today, per vascular no need for antibiotics at discharge.   2-Hypertension: Continue with Norvasc and Toprol.  Hold losartan and hydrochlorothiazide   HLD: Continue with Crestor   COPD continue with Symbicort   Glaucoma continue with latanoprost Rash on the back:started on Eucerin/triamcinolone   Mild Hyponatremia: Monitor Anemia, blood loss postsurgery monitor B12: 267, iron 86 Will start oral B 12 supplement and iron trial.   CKD 3a; stable.   Nutrition Problem: Moderate Malnutrition Etiology: chronic illness (COPD, cancer)  on supplement.       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) Active Problems:   Essential hypertension   Hyperlipidemia LDL goal <70   COPD (chronic obstructive pulmonary disease) (HCC)   Malnutrition of moderate degree    Discharge Instructions  Discharge Instructions     Diet - low sodium heart healthy   Complete by: As directed    Discharge wound care:   Complete by: As directed    Increase activity slowly   Complete by: As directed       Allergies as of 12/30/2020       Reactions   Avelox [moxifloxacin Hcl In Nacl] Diarrhea   Moxifloxacin Diarrhea   Cilostazol Diarrhea        Medication List     STOP taking these medications    cefdinir 300 MG capsule Commonly known as: OMNICEF   doxycycline 100 MG tablet Commonly known as: VIBRA-TABS   losartan-hydrochlorothiazide 100-12.5 MG tablet Commonly known as: HYZAAR       TAKE these medications    acetaminophen 500 MG tablet Commonly known as: TYLENOL Take 500 mg by mouth every 6 (six) hours as needed for mild pain or headache.   amLODipine 5 MG tablet Commonly known as: NORVASC Take 5 mg by mouth at bedtime.   aspirin 81 MG tablet Take 81 mg by mouth at bedtime.   budesonide-formoterol 80-4.5 MCG/ACT inhaler Commonly known as: Symbicort Inhale 2 puffs into the lungs 2 (two) times daily as needed.   CALTRATE 600+D PO Take 1 tablet by mouth daily.   docusate sodium 100 MG capsule Commonly known as: COLACE Take 1 capsule (100 mg total) by mouth daily as needed for  mild constipation.   ferrous sulfate 325 (65 FE) MG tablet Take 1 tablet (325 mg total) by mouth daily with breakfast. Start taking on: December 31, 2020   Fish Oil 1200 MG Caps Take 3,600 mg by mouth daily.   fluticasone 50 MCG/ACT nasal spray Commonly known as: FLONASE Place 2 sprays into both nostrils daily as needed for rhinitis or allergies (or nasal congestion).   HYDROcodone-acetaminophen  5-325 MG tablet Commonly known as: NORCO/VICODIN Take 1 tablet by mouth every 6 (six) hours as needed for moderate pain.   latanoprost 0.005 % ophthalmic solution Commonly known as: XALATAN Place 1 drop into the left eye at bedtime.   LUTEIN PO Take 1 capsule by mouth daily.   metoprolol succinate 25 MG 24 hr tablet Commonly known as: TOPROL-XL TAKE 1 TABLET (25 MG TOTAL) BY MOUTH DAILY. ** DO NOT CRUSH ** (BETA BLOCKER) What changed:  when to take this additional instructions   multivitamin with minerals Tabs tablet Take 1 tablet by mouth daily. Start taking on: December 31, 2020   nicotine 14 mg/24hr patch Commonly known as: NICODERM CQ - dosed in mg/24 hours Place 1 patch (14 mg total) onto the skin daily. Start taking on: December 31, 2020   rosuvastatin 10 MG tablet Commonly known as: CRESTOR TAKE 1 TABLET BY MOUTH EVERY DAY What changed: when to take this   Vitamin B-12 2500 MCG Subl Place 2,500 mcg under the tongue daily. What changed: Another medication with the same name was added. Make sure you understand how and when to take each.   vitamin B-12 500 MCG tablet Commonly known as: CYANOCOBALAMIN Take 1 tablet (500 mcg total) by mouth daily. Start taking on: December 31, 2020 What changed: You were already taking a medication with the same name, and this prescription was added. Make sure you understand how and when to take each.               Discharge Care Instructions  (From admission, onward)           Start     Ordered   12/30/20 0000  Discharge wound care:        12/30/20 1034            Follow-up Information     Waynetta Sandy, MD Follow up in 3 week(s).   Specialties: Vascular Surgery, Cardiology Why: Office will call you to arrange your appt (sent) Contact information: [address] [place] [address] [phone]                Allergies  Allergen Reactions   Avelox [Moxifloxacin Hcl In Nacl] Diarrhea    Moxifloxacin Diarrhea   Cilostazol Diarrhea    Consultations: VAscular   Procedures/Studies: PERIPHERAL VASCULAR CATHETERIZATION  Result Date: 12/20/2020 Patient name: Lance Dillon MRN: [id number] DOB: [birth year] Sex: male 12/20/2020 Pre-operative Diagnosis: Critical right lower extremity ischemia with fourth toe ulceration Post-operative diagnosis:  Same Surgeon:  Erlene Quan C. Donzetta Matters, MD Procedure Performed: 1.  Ultrasound-guided cannulation left common femoral artery 2.  Aortogram 3.  Selection of right external leg artery and right lower extremity angiography 4.  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  He now has a fourth toe ulceration with depressed ABIs and has previous duplex demonstrating occluded bypass graft.  He is now indicated for angiography with possible intervention. Findings:  Left common femoral artery appeared to be occluded I was able to cannulate this just at the top prior to a collateral and get access.  Performed aortogram which demonstrated heavily calcified aorta and bilateral common and external iliac arteries.  I then crossed over to the common iliac artery on the right where I had collaterals to fill the right lower extremity.  I performed angiography was demonstrated occluded right common femoral with long segment occlusion of his SFA which was heavily calcified reconstitutes at the level of the knee where it is diseased below the knee is much less diseased appears to have runoff via the anterior tibial and peroneal arteries. Plan will be for right common femoral endarterectomy and femoral to below-knee popliteal artery bypass which will likely need to be performed with graft given the vein has been harvested in the past for bypass.  Procedure:  The patient was identified in the holding area and taken to room 8.   The patient was then placed supine on the table and prepped and draped in the usual sterile fashion.  A time out was called.  Ultrasound was used to evaluate the left common femoral artery.  This was heavily calcified.  I did find an area cephalad that was apparently patent.  I anesthetized the area and cannulated with micropuncture needle followed by wire and sheath.  I placed a Bentson wire followed by a 5 [place] sheath.  I placed an Omni catheter to the level of L1 and performed aortogram.  I then crossed the bifurcation with an Omni catheter and Bentson wire placed the catheter into the external neck artery on the right and perform right lower extremity angiography with the above findings.  I elected for no intervention.  The catheter was removed over wire.  Sheath will be pulled in postoperative holding.  He tolerated procedure without any complication. Contrast: 80 cc Brandon C. Donzetta Matters, MD [facility] Office: [phone] Pager: [phone]   DG Foot Complete Right  Result Date: 12/19/2020 CLINICAL DATA:  Soreness in the fourth and fifth toes. EXAM: RIGHT FOOT COMPLETE - 3+ VIEW COMPARISON:  None. FINDINGS: There is no evidence of fracture or dislocation. There is no evidence of arthropathy or other focal bone abnormality. Soft tissues are unremarkable. IMPRESSION: Negative. Electronically Signed   By: Misty Stanley M.D.   On: 12/19/2020 11:21     Subjective: Alert, denies worsening pain  Discharge Exam: Vitals:   12/30/20 0508 12/30/20 0846  BP: 112/69 (!) 112/56  Pulse: 72 77  Resp: 18 18  Temp: 97.8 F (36.6 C) 97.8 F (36.6 C)  SpO2: 98% 100%     General: Pt is alert, awake, not in acute distress Cardiovascular: RRR, S1/S2 +, no rubs, no gallops Respiratory: CTA bilaterally, no wheezing, no rhonchi Abdominal: Soft, NT, ND, bowel sounds + Extremities: no edema, no cyanosis    The results of significant diagnostics from this hospitalization  (including imaging, microbiology, ancillary and laboratory) are listed below for reference.     Microbiology: Recent Results (from the past 240 hour(s))  Surgical pcr screen     Status: Abnormal   Collection Time: 12/21/20 10:30 AM  Result Value Ref Range Status   MRSA, PCR NEGATIVE NEGATIVE Final   Staphylococcus aureus POSITIVE (A) NEGATIVE Final    Comment: (NOTE) The Xpert SA Assay (FDA approved for NASAL specimens in patients 22 years of age and older), is one component of a comprehensive surveillance program. It is not intended  to diagnose infection nor to guide or monitor treatment. Performed at [facility], [place] [address] [place], [place] [address]   SARS Coronavirus 2 by RT PCR (hospital order, performed in [facility] hospital lab) Nasopharyngeal Nasopharyngeal Swab     Status: None   Collection Time: 12/28/20  7:00 AM   Specimen: Nasopharyngeal Swab  Result Value Ref Range Status   SARS Coronavirus 2 NEGATIVE NEGATIVE Final    Comment: (NOTE) SARS-CoV-2 target nucleic acids are NOT DETECTED.  The SARS-CoV-2 RNA is generally detectable in upper and lower respiratory specimens during the acute phase of infection. The lowest concentration of SARS-CoV-2 viral copies this assay can detect is 250 copies / mL. A negative result does not preclude SARS-CoV-2 infection and should not be used as the sole basis for treatment or other patient management decisions.  A negative result may occur with improper specimen collection / handling, submission of specimen other than nasopharyngeal swab, presence of viral mutation(s) within the areas targeted by this assay, and inadequate number of viral copies (<250 copies / mL). A negative result must be combined with clinical observations, patient history, and epidemiological information.  Fact Sheet for Patients:   StrictlyIdeas.no  Fact Sheet for Healthcare  Providers: BankingDealers.co.za  This test is not yet approved or  cleared by the [place] FDA and has been authorized for detection and/or diagnosis of SARS-CoV-2 by FDA under an Emergency Use Authorization (EUA).  This EUA will remain in effect (meaning this test can be used) for the duration of the COVID-19 declaration under Section 564(b)(1) of the Act, 21 U.S.C. section 360bbb-3(b)(1), unless the authorization is terminated or revoked sooner.  Performed at [facility], [place] [address], [place], [place] [address]      Labs: BNP (last 3 results) No results for input(s): BNP in the last 8760 hours. Basic Metabolic Panel: Recent Labs  Lab 12/24/20 0206 12/28/20 0751 12/29/20 0135  NA 133* 136 133*  K 4.2 4.5 3.8  CL 102 102 101  CO2 24  --  27  GLUCOSE 109* 114* 113*  BUN 17 31* 27*  CREATININE 1.26* 1.40* 1.36*  CALCIUM 7.8*  --  8.1*  MG 1.9  --   --    Liver Function Tests: No results for input(s): AST, ALT, ALKPHOS, BILITOT, PROT, ALBUMIN in the last 168 hours. No results for input(s): LIPASE, AMYLASE in the last 168 hours. No results for input(s): AMMONIA in the last 168 hours. CBC: Recent Labs  Lab 12/24/20 0206 12/28/20 0751 12/29/20 0135 12/30/20 0112  WBC 7.5  --  4.1 4.3  HGB 9.0* 9.5* 7.7* 8.4*  HCT 27.5* 28.0* 23.7* 25.9*  MCV 94.8  --  97.5 97.0  PLT 143*  --  168 185   Cardiac Enzymes: No results for input(s): CKTOTAL, CKMB, CKMBINDEX, TROPONINI in the last 168 hours. BNP: Invalid input(s): POCBNP CBG: No results for input(s): GLUCAP in the last 168 hours. D-Dimer No results for input(s): DDIMER in the last 72 hours. Hgb A1c No results for input(s): HGBA1C in the last 72 hours. Lipid Profile No results for input(s): CHOL, HDL, LDLCALC, TRIG, CHOLHDL, LDLDIRECT in the last 72 hours. Thyroid function studies No results for input(s): TSH, T4TOTAL, T3FREE, THYROIDAB in the last 72 hours.  Invalid input(s):  FREET3 Anemia work up Recent Labs    12/30/20 0112  VITAMINB12 267  FOLATE 12.5  FERRITIN 487*  TIBC 231*  IRON 86  RETICCTPCT 5.0*   Urinalysis  Component Value Date/Time   COLORURINE RED (A) 07/06/2015 0114   APPEARANCEUR CLOUDY (A) 07/06/2015 0114   LABSPEC 1.008 07/06/2015 0114   PHURINE 6.0 07/06/2015 0114   GLUCOSEU NEGATIVE 07/06/2015 0114   HGBUR LARGE (A) 07/06/2015 0114   BILIRUBINUR NEGATIVE 07/06/2015 0114   KETONESUR NEGATIVE 07/06/2015 0114   PROTEINUR 100 (A) 07/06/2015 0114   UROBILINOGEN 1.0 07/13/2011 0844   NITRITE NEGATIVE 07/06/2015 0114   LEUKOCYTESUR LARGE (A) 07/06/2015 0114   Sepsis Labs Invalid input(s): PROCALCITONIN,  WBC,  LACTICIDVEN Microbiology Recent Results (from the past 240 hour(s))  Surgical pcr screen     Status: Abnormal   Collection Time: 12/21/20 10:30 AM  Result Value Ref Range Status   MRSA, PCR NEGATIVE NEGATIVE Final   Staphylococcus aureus POSITIVE (A) NEGATIVE Final    Comment: (NOTE) The Xpert SA Assay (FDA approved for NASAL specimens in patients 73 years of age and older), is one component of a comprehensive surveillance program. It is not intended to diagnose infection nor to guide or monitor treatment. Performed at [facility], [place] [address], [place], [place] [address]   SARS Coronavirus 2 by RT PCR (hospital order, performed in [facility] hospital lab) Nasopharyngeal Nasopharyngeal Swab     Status: None   Collection Time: 12/28/20  7:00 AM   Specimen: Nasopharyngeal Swab  Result Value Ref Range Status   SARS Coronavirus 2 NEGATIVE NEGATIVE Final    Comment: (NOTE) SARS-CoV-2 target nucleic acids are NOT DETECTED.  The SARS-CoV-2 RNA is generally detectable in upper and lower respiratory specimens during the acute phase of infection. The lowest concentration of SARS-CoV-2 viral copies this assay can detect is 250 copies / mL. A negative result does not preclude SARS-CoV-2 infection and should not  be used as the sole basis for treatment or other patient management decisions.  A negative result may occur with improper specimen collection / handling, submission of specimen other than nasopharyngeal swab, presence of viral mutation(s) within the areas targeted by this assay, and inadequate number of viral copies (<250 copies / mL). A negative result must be combined with clinical observations, patient history, and epidemiological information.  Fact Sheet for Patients:   StrictlyIdeas.no  Fact Sheet for Healthcare Providers: BankingDealers.co.za  This test is not yet approved or  cleared by the [place] FDA and has been authorized for detection and/or diagnosis of SARS-CoV-2 by FDA under an Emergency Use Authorization (EUA).  This EUA will remain in effect (meaning this test can be used) for the duration of the COVID-19 declaration under Section 564(b)(1) of the Act, 21 U.S.C. section 360bbb-3(b)(1), unless the authorization is terminated or revoked sooner.  Performed at [facility], [place] [address] [place], [place] [address]      Time coordinating discharge: 40 minutes  SIGNED:   Elmarie Shiley, MD  [facility]

## 2020-12-30 NOTE — Progress Notes (Addendum)
[facility]  Subjective  - Sore where amputation is.   Objective 112/69 72 97.8 F (36.6 C) (Oral) 18 98%  Intake/Output Summary (Last 24 hours) at 12/30/2020 0741 Last data filed at 12/30/2020 8891 Gross per 24 hour  Intake 1235 ml  Output 2450 ml  Net -1215 ml    Right 4th toe amputation site healing well with minimal erythema, dry dressing placed over incision Right groin and LE incisions healing well Palpable right DP, minimal edema with good skin lines Lungs non labored breathing  Assessment/Planning: POD # 2 4th amputation   Dry dressing over incision until no drainage Good inflow with palpable DP right LE Afebrile negative leukocytosis From a vascular point of view he will keep a 3 week with ABI's f/u He wishes for Dr. Donzetta Matters to follow his carotid stenosis  and B LE PAD.  He has a history of renal artery stenosis as well.  Cr is elevated 1.4 as of 12/29/20.  His baseline in 1.2.   He asked if Dr. Donzetta Matters can follow this as well.  He has known left LE PAD as well and may have future intervention.  Stable from a vascular point of view Roxy Horseman 12/30/2020 7:41 AM --  Laboratory Lab Results: Recent Labs    12/29/20 0135 12/30/20 0112  WBC 4.1 4.3  HGB 7.7* 8.4*  HCT 23.7* 25.9*  PLT 168 185   BMET Recent Labs    12/28/20 0751 12/29/20 0135  NA 136 133*  K 4.5 3.8  CL 102 101  CO2  --  27  GLUCOSE 114* 113*  BUN 31* 27*  CREATININE 1.40* 1.36*  CALCIUM  --  8.1*    COAG Lab Results  Component Value Date   INR 1.00 04/27/2015   INR 1.02 12/15/2013   INR 1.03 07/13/2011   No results found for: PTT   I have interviewed and examined patient with PA and agree with assessment and plan above.  Patient has requested that we assume all of his vascular care in the future as documented above.  We will plan to get him over this initial operation and then consider left lower extremity revascularization and follow  his carotid arteries and renal arteries with duplex long-term.  Communicated this plan with the patient and his wife and they demonstrate good understanding.  When he is cleared from a PT standpoint he is okay from a vascular standpoint for discharge with home health physical therapy  Syna Gad C. Donzetta Matters, MD [facility] Office: [phone] Pager: [phone]

## 2020-12-30 NOTE — Progress Notes (Signed)
Mobility Specialist Progress Note:   12/30/20 1030  Mobility  Activity Ambulated in hall;Ambulated in room;Ambulated to bathroom  Level of Assistance Contact guard assist, steadying assist  Assistive Device Front wheel walker  RLE Weight Bearing PWB  RLE Partial Weight Bearing Percentage or Pounds Heel WB  Distance Ambulated (ft) 60 ft  Mobility Ambulated with assistance in hallway;Ambulated with assistance in room  Mobility Response Tolerated well  Mobility performed by Mobility specialist  $Mobility charge 1 Mobility   Pt to BR to void before session. BM successful. Required contactG during ambulation with RW. Pt was able to maintain heel WB on RLE. Back in bed with all needs met.   Anna Kincaid Mobility Specialist  Phone [phone]  

## 2021-01-01 ENCOUNTER — Encounter (HOSPITAL_COMMUNITY): Payer: Self-pay | Admitting: Vascular Surgery

## 2021-01-01 MED ORDER — BUPIVACAINE-EPINEPHRINE (PF) 0.5% -1:200000 IJ SOLN
INTRAMUSCULAR | Status: DC | PRN
  Administered 2020-12-28: 25 mL via PERINEURAL

## 2021-01-01 NOTE — Anesthesia Procedure Notes (Signed)
Anesthesia Regional Block: Popliteal block   Pre-Anesthetic Checklist: , timeout performed,  Correct Patient, Correct Site, Correct Laterality,  Correct Procedure, Correct Position, site marked,  Risks and benefits discussed,  Surgical consent,  Pre-op evaluation,  At surgeon's request and post-op pain management  Laterality: Right and Lower  Prep: chloraprep       Needles:  Injection technique: Single-shot      Needle Length: 9cm  Needle Gauge: 22     Additional Needles: Arrow StimuQuik ECHO Echogenic Stimulating PNB Needle  Procedures:,,,, ultrasound used (permanent image in chart),,    Narrative:  Start time: 12/28/2020 9:24 AM End time: 12/28/2020 9:29 AM Injection made incrementally with aspirations every 5 mL.  Performed by: Personally  Anesthesiologist: Oleta Mouse, MD

## 2021-01-01 NOTE — Anesthesia Postprocedure Evaluation (Signed)
Anesthesia Post Note  Patient: Lance Dillon  Procedure(s) Performed: RIGHT 4TH TOE AMPUTATION (Right: Toe)     Patient location during evaluation: PACU Anesthesia Type: Regional and MAC Level of consciousness: awake and alert Pain management: pain level controlled Vital Signs Assessment: post-procedure vital signs reviewed and stable Respiratory status: spontaneous breathing, nonlabored ventilation, respiratory function stable and patient connected to nasal cannula oxygen Cardiovascular status: stable and blood pressure returned to baseline Postop Assessment: no apparent nausea or vomiting Anesthetic complications: no   No notable events documented.  Last Vitals:  Vitals:   12/30/20 0508 12/30/20 0846  BP: 112/69 (!) 112/56  Pulse: 72 77  Resp: 18 18  Temp: 36.6 C 36.6 C  SpO2: 98% 100%    Last Pain:  Vitals:   12/30/20 0950  TempSrc:   PainSc: 2                  Jasemine Nawaz

## 2021-01-03 DIAGNOSIS — Z89421 Acquired absence of other right toe(s): Secondary | ICD-10-CM | POA: Diagnosis not present

## 2021-01-03 DIAGNOSIS — I129 Hypertensive chronic kidney disease with stage 1 through stage 4 chronic kidney disease, or unspecified chronic kidney disease: Secondary | ICD-10-CM | POA: Diagnosis not present

## 2021-01-03 DIAGNOSIS — Z4781 Encounter for orthopedic aftercare following surgical amputation: Secondary | ICD-10-CM | POA: Diagnosis not present

## 2021-01-03 DIAGNOSIS — N1831 Chronic kidney disease, stage 3a: Secondary | ICD-10-CM | POA: Diagnosis not present

## 2021-01-03 DIAGNOSIS — I739 Peripheral vascular disease, unspecified: Secondary | ICD-10-CM | POA: Diagnosis not present

## 2021-01-03 DIAGNOSIS — E785 Hyperlipidemia, unspecified: Secondary | ICD-10-CM | POA: Diagnosis not present

## 2021-01-03 DIAGNOSIS — J449 Chronic obstructive pulmonary disease, unspecified: Secondary | ICD-10-CM | POA: Diagnosis not present

## 2021-01-03 DIAGNOSIS — Z7982 Long term (current) use of aspirin: Secondary | ICD-10-CM | POA: Diagnosis not present

## 2021-01-03 DIAGNOSIS — E440 Moderate protein-calorie malnutrition: Secondary | ICD-10-CM | POA: Diagnosis not present

## 2021-01-03 DIAGNOSIS — M810 Age-related osteoporosis without current pathological fracture: Secondary | ICD-10-CM | POA: Diagnosis not present

## 2021-01-04 DIAGNOSIS — C61 Malignant neoplasm of prostate: Secondary | ICD-10-CM | POA: Diagnosis not present

## 2021-01-05 ENCOUNTER — Other Ambulatory Visit: Payer: Self-pay

## 2021-01-05 DIAGNOSIS — I739 Peripheral vascular disease, unspecified: Secondary | ICD-10-CM

## 2021-01-06 DIAGNOSIS — E871 Hypo-osmolality and hyponatremia: Secondary | ICD-10-CM | POA: Diagnosis not present

## 2021-01-06 DIAGNOSIS — Z89421 Acquired absence of other right toe(s): Secondary | ICD-10-CM | POA: Diagnosis not present

## 2021-01-06 DIAGNOSIS — D62 Acute posthemorrhagic anemia: Secondary | ICD-10-CM | POA: Diagnosis not present

## 2021-01-06 DIAGNOSIS — E785 Hyperlipidemia, unspecified: Secondary | ICD-10-CM | POA: Diagnosis not present

## 2021-01-06 DIAGNOSIS — I96 Gangrene, not elsewhere classified: Secondary | ICD-10-CM | POA: Diagnosis not present

## 2021-01-06 DIAGNOSIS — Z09 Encounter for follow-up examination after completed treatment for conditions other than malignant neoplasm: Secondary | ICD-10-CM | POA: Diagnosis not present

## 2021-01-06 DIAGNOSIS — I739 Peripheral vascular disease, unspecified: Secondary | ICD-10-CM | POA: Diagnosis not present

## 2021-01-06 DIAGNOSIS — I129 Hypertensive chronic kidney disease with stage 1 through stage 4 chronic kidney disease, or unspecified chronic kidney disease: Secondary | ICD-10-CM | POA: Diagnosis not present

## 2021-01-06 DIAGNOSIS — I779 Disorder of arteries and arterioles, unspecified: Secondary | ICD-10-CM | POA: Diagnosis not present

## 2021-01-07 ENCOUNTER — Encounter (HOSPITAL_COMMUNITY): Payer: Medicare HMO

## 2021-01-13 ENCOUNTER — Encounter (HOSPITAL_COMMUNITY): Payer: Medicare HMO

## 2021-01-14 ENCOUNTER — Other Ambulatory Visit: Payer: Self-pay

## 2021-01-14 ENCOUNTER — Ambulatory Visit (INDEPENDENT_AMBULATORY_CARE_PROVIDER_SITE_OTHER)
Admission: RE | Admit: 2021-01-14 | Discharge: 2021-01-14 | Disposition: A | Discharge status: Home / Self Care | Payer: Medicare HMO | Source: Ambulatory Visit | Attending: Physician Assistant | Admitting: Physician Assistant

## 2021-01-14 ENCOUNTER — Ambulatory Visit (HOSPITAL_COMMUNITY)
Admission: RE | Admit: 2021-01-14 | Discharge: 2021-01-14 | Disposition: A | Discharge status: Home / Self Care | Payer: Medicare HMO | Source: Ambulatory Visit | Attending: Physician Assistant | Admitting: Physician Assistant

## 2021-01-14 DIAGNOSIS — I739 Peripheral vascular disease, unspecified: Secondary | ICD-10-CM | POA: Diagnosis present

## 2021-01-19 ENCOUNTER — Other Ambulatory Visit: Payer: Self-pay

## 2021-01-19 ENCOUNTER — Ambulatory Visit (INDEPENDENT_AMBULATORY_CARE_PROVIDER_SITE_OTHER): Payer: Medicare HMO | Admitting: Physician Assistant

## 2021-01-19 VITALS — BP 122/79 | HR 82 | Temp 98.2°F | Resp 20 | Ht 67.0 in | Wt 164.3 lb

## 2021-01-19 DIAGNOSIS — M545 Low back pain, unspecified: Secondary | ICD-10-CM | POA: Insufficient documentation

## 2021-01-19 DIAGNOSIS — Z8701 Personal history of pneumonia (recurrent): Secondary | ICD-10-CM | POA: Insufficient documentation

## 2021-01-19 DIAGNOSIS — I129 Hypertensive chronic kidney disease with stage 1 through stage 4 chronic kidney disease, or unspecified chronic kidney disease: Secondary | ICD-10-CM | POA: Insufficient documentation

## 2021-01-19 DIAGNOSIS — I739 Peripheral vascular disease, unspecified: Secondary | ICD-10-CM

## 2021-01-19 DIAGNOSIS — Z89421 Acquired absence of other right toe(s): Secondary | ICD-10-CM | POA: Insufficient documentation

## 2021-01-19 DIAGNOSIS — N1831 Chronic kidney disease, stage 3a: Secondary | ICD-10-CM | POA: Insufficient documentation

## 2021-01-19 DIAGNOSIS — J4541 Moderate persistent asthma with (acute) exacerbation: Secondary | ICD-10-CM | POA: Insufficient documentation

## 2021-01-19 DIAGNOSIS — Z720 Tobacco use: Secondary | ICD-10-CM | POA: Insufficient documentation

## 2021-01-19 DIAGNOSIS — M810 Age-related osteoporosis without current pathological fracture: Secondary | ICD-10-CM | POA: Insufficient documentation

## 2021-01-19 DIAGNOSIS — M419 Scoliosis, unspecified: Secondary | ICD-10-CM | POA: Insufficient documentation

## 2021-01-19 DIAGNOSIS — I96 Gangrene, not elsewhere classified: Secondary | ICD-10-CM | POA: Insufficient documentation

## 2021-01-19 DIAGNOSIS — R0989 Other specified symptoms and signs involving the circulatory and respiratory systems: Secondary | ICD-10-CM | POA: Insufficient documentation

## 2021-01-19 DIAGNOSIS — Z0001 Encounter for general adult medical examination with abnormal findings: Secondary | ICD-10-CM | POA: Insufficient documentation

## 2021-01-19 DIAGNOSIS — R32 Unspecified urinary incontinence: Secondary | ICD-10-CM | POA: Insufficient documentation

## 2021-01-19 NOTE — Progress Notes (Signed)
POST OPERATIVE OFFICE NOTE    CC:  F/u for surgery  HPI:  This is a 80 y.o. male who is s/p right common femoral endarterectomy with redo femoral to below the knee popliteal bypass with PTFE by Dr. Donzetta Matters on 12/21/2020.  This was performed due to critical limb ischemia with fourth toe ulceration.  He returned to the office postoperatively with intolerable fourth toe pain.  He underwent fourth toe amputation by Dr. Donzetta Matters on 12/28/2020.  Patient states right leg is edematous however fluid collection in the groin has softened.  He denies rest pain in his right foot.  He believes the toe amputation site is healing well.  He is on aspirin and statin daily.  Allergies  Allergen Reactions   Avelox [Moxifloxacin Hcl In Nacl] Diarrhea   Moxifloxacin Diarrhea   Cilostazol Diarrhea   Cyclobenzaprine Other (See Comments)   Quinolones     Other reaction(s): Unknown   Ramipril     Other reaction(s): Unknown    Current Outpatient Medications  Medication Sig Dispense Refill   acetaminophen (TYLENOL) 500 MG tablet Take 500 mg by mouth every 6 (six) hours as needed for mild pain or headache.     amLODipine (NORVASC) 5 MG tablet Take 5 mg by mouth at bedtime.     aspirin 81 MG tablet Take 81 mg by mouth at bedtime.     budesonide-formoterol (SYMBICORT) 80-4.5 MCG/ACT inhaler Inhale 2 puffs into the lungs 2 (two) times daily as needed. 1 each 2   Calcium Carbonate-Vitamin D (CALTRATE 600+D PO) Take 1 tablet by mouth daily.      Cyanocobalamin (VITAMIN B-12) 2500 MCG SUBL Place 2,500 mcg under the tongue daily.     docusate sodium (COLACE) 100 MG capsule Take 1 capsule (100 mg total) by mouth daily as needed for mild constipation.  0   ferrous sulfate 325 (65 FE) MG tablet Take 1 tablet (325 mg total) by mouth daily with breakfast. 30 tablet 1   fluticasone (FLONASE) 50 MCG/ACT nasal spray Place 2 sprays into both nostrils daily as needed for rhinitis or allergies (or nasal congestion).      HYDROcodone-acetaminophen (NORCO/VICODIN) 5-325 MG tablet Take 1 tablet by mouth every 6 (six) hours as needed for moderate pain. 20 tablet 0   latanoprost (XALATAN) 0.005 % ophthalmic solution Place 1 drop into the left eye at bedtime.      LUTEIN PO Take 1 capsule by mouth daily.     metoprolol succinate (TOPROL-XL) 25 MG 24 hr tablet TAKE 1 TABLET (25 MG TOTAL) BY MOUTH DAILY. ** DO NOT CRUSH ** (BETA BLOCKER) (Patient taking differently: Take 25 mg by mouth See admin instructions. Take 25 mg by mouth at bedtime ** DO NOT CRUSH **    (BETA BLOCKER)) 90 tablet 3   Multiple Vitamin (MULTIVITAMIN WITH MINERALS) TABS tablet Take 1 tablet by mouth daily. 30 tablet 0   nicotine (NICODERM CQ - DOSED IN MG/24 HOURS) 14 mg/24hr patch Place 1 patch (14 mg total) onto the skin daily. 28 patch 0   Omega-3 Fatty Acids (FISH OIL) 1200 MG CAPS Take 3,600 mg by mouth daily.     rosuvastatin (CRESTOR) 10 MG tablet TAKE 1 TABLET BY MOUTH EVERY DAY (Patient taking differently: Take 10 mg by mouth at bedtime.) 90 tablet 3   vitamin B-12 (CYANOCOBALAMIN) 500 MCG tablet Take 1 tablet (500 mcg total) by mouth daily. 30 tablet 0   No current facility-administered medications for this visit.  ROS:  See HPI  Physical Exam:  Vitals:   01/19/21 1326  BP: 122/79  Pulse: 82  Resp: 20  Temp: 98.2 F (36.8 C)  TempSrc: Temporal  SpO2: 99%  Weight: 164 lb 4.8 oz (74.5 kg)  Height: 5\' 7"  (1.702 m)    Incision: Right groin and popliteal incisions are well-healed; right fourth toe amputation nearly healed Extremities: 2+ palpable right DP pulse; pitting edema to the level of the knee with fluid collection at the right groin which is soft  Assessment/Plan:  This is a 80 y.o. male who is s/p: Right femoral endarterectomy with redo femoral to popliteal bypass with PTFE with subsequent fourth toe amputation  -Right foot is well-perfused with palpable DP pulse -Duplex demonstrates a widely patent bypass; also noted  on duplex were hematomas at proximal and distal anastomosis -Sutures were removed from right fourth toe amputation which is nearly healed; recommended cleansing with soap and water daily and cover with dry dressing -Encouraged patient to elevate his leg to manage edema however this should not keep him from walking -Dr. Donzetta Matters discussed possible revascularization of left lower extremity however patient states claudication is tolerable and he is without rest pain or tissue loss; left ABI 0.5 with absent TBI -Plan is to repeat bypass surveillance and bilateral ABIs in 3 months.  Patient would also prefer that we follow carotid and renal artery stenosis.  We will get the studies in 3 months as well   Dagoberto Ligas, PA-C [facility] [phone]  Clinic MD:  Donzetta Matters

## 2021-01-20 ENCOUNTER — Other Ambulatory Visit: Payer: Self-pay

## 2021-01-20 DIAGNOSIS — I739 Peripheral vascular disease, unspecified: Secondary | ICD-10-CM

## 2021-01-21 DIAGNOSIS — N1831 Chronic kidney disease, stage 3a: Secondary | ICD-10-CM | POA: Diagnosis not present

## 2021-01-21 DIAGNOSIS — I129 Hypertensive chronic kidney disease with stage 1 through stage 4 chronic kidney disease, or unspecified chronic kidney disease: Secondary | ICD-10-CM | POA: Diagnosis not present

## 2021-01-21 DIAGNOSIS — I739 Peripheral vascular disease, unspecified: Secondary | ICD-10-CM | POA: Diagnosis not present

## 2021-01-21 DIAGNOSIS — Z4781 Encounter for orthopedic aftercare following surgical amputation: Secondary | ICD-10-CM | POA: Diagnosis not present

## 2021-01-28 DIAGNOSIS — M810 Age-related osteoporosis without current pathological fracture: Secondary | ICD-10-CM | POA: Diagnosis not present

## 2021-01-28 DIAGNOSIS — N1831 Chronic kidney disease, stage 3a: Secondary | ICD-10-CM | POA: Diagnosis not present

## 2021-01-28 DIAGNOSIS — I129 Hypertensive chronic kidney disease with stage 1 through stage 4 chronic kidney disease, or unspecified chronic kidney disease: Secondary | ICD-10-CM | POA: Diagnosis not present

## 2021-01-28 DIAGNOSIS — J441 Chronic obstructive pulmonary disease with (acute) exacerbation: Secondary | ICD-10-CM | POA: Diagnosis not present

## 2021-02-27 DIAGNOSIS — N1831 Chronic kidney disease, stage 3a: Secondary | ICD-10-CM | POA: Diagnosis not present

## 2021-02-27 DIAGNOSIS — I129 Hypertensive chronic kidney disease with stage 1 through stage 4 chronic kidney disease, or unspecified chronic kidney disease: Secondary | ICD-10-CM | POA: Diagnosis not present

## 2021-02-27 DIAGNOSIS — M810 Age-related osteoporosis without current pathological fracture: Secondary | ICD-10-CM | POA: Diagnosis not present

## 2021-02-27 DIAGNOSIS — J441 Chronic obstructive pulmonary disease with (acute) exacerbation: Secondary | ICD-10-CM | POA: Diagnosis not present

## 2021-03-01 DIAGNOSIS — N1831 Chronic kidney disease, stage 3a: Secondary | ICD-10-CM | POA: Diagnosis not present

## 2021-03-01 DIAGNOSIS — M810 Age-related osteoporosis without current pathological fracture: Secondary | ICD-10-CM | POA: Diagnosis not present

## 2021-03-01 DIAGNOSIS — I129 Hypertensive chronic kidney disease with stage 1 through stage 4 chronic kidney disease, or unspecified chronic kidney disease: Secondary | ICD-10-CM | POA: Diagnosis not present

## 2021-03-01 DIAGNOSIS — J441 Chronic obstructive pulmonary disease with (acute) exacerbation: Secondary | ICD-10-CM | POA: Diagnosis not present

## 2021-03-07 ENCOUNTER — Ambulatory Visit (HOSPITAL_COMMUNITY): Payer: Medicare HMO

## 2021-03-10 ENCOUNTER — Telehealth: Payer: Self-pay | Admitting: *Deleted

## 2021-03-10 NOTE — Telephone Encounter (Signed)
Wife called inquiring information about compression stockings.  She would like patient to be measured for 15-20 knee highs.  She will bring patient by for measurement.Marland Kitchen

## 2021-03-15 DIAGNOSIS — J439 Emphysema, unspecified: Secondary | ICD-10-CM | POA: Diagnosis not present

## 2021-03-16 ENCOUNTER — Encounter: Payer: Self-pay | Admitting: Cardiology

## 2021-03-16 ENCOUNTER — Ambulatory Visit: Payer: Medicare HMO | Admitting: Cardiology

## 2021-03-16 ENCOUNTER — Other Ambulatory Visit: Payer: Self-pay

## 2021-03-16 VITALS — BP 110/78 | HR 82 | Temp 97.2°F | Resp 16 | Ht 67.0 in | Wt 162.0 lb

## 2021-03-16 DIAGNOSIS — I10 Essential (primary) hypertension: Secondary | ICD-10-CM | POA: Diagnosis not present

## 2021-03-16 DIAGNOSIS — I70213 Atherosclerosis of native arteries of extremities with intermittent claudication, bilateral legs: Secondary | ICD-10-CM

## 2021-03-16 DIAGNOSIS — E785 Hyperlipidemia, unspecified: Secondary | ICD-10-CM

## 2021-03-16 DIAGNOSIS — I739 Peripheral vascular disease, unspecified: Secondary | ICD-10-CM | POA: Diagnosis not present

## 2021-03-16 NOTE — Progress Notes (Signed)
Primary Physician/Referring:  Deland Pretty, MD  Patient ID: Lance Dillon, male    DOB: 08/02/1940, 81 y.o.   MRN: [id number]  Chief Complaint  Patient presents with   New Patient (Initial Visit)   PAD (peripheral artery disease) ([place])   HPI:    Lance Dillon  is a 81 y.o. 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 Quay Burow, wants to establish with me.  Past medical history significant for hypertension, hyperlipidemia, chronic stage IIIa creatinine disease, chronic anemia and remote tobacco use disorder with a 90-pack-year history quit in 2004.  Patient wants to establish with me. He is concerned about coronary artery disease in view of significant peripheral arterial disease.  Patient states that he has recuperated well from surgery and has started to walk again has very mild symptoms in his left calf but overall symptoms have improved.  He denies chest pain, dyspnea.  States that he still is working part-time.  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   Bilateral: Noted on MRA of brain in November 2015 with collateral flow from posterior circulation and external collaterals reconstituting anterior circulation   Chronic kidney disease    Colon polyps 2004, 07, 14   COPD (chronic obstructive pulmonary disease) (HCC)    GERD (gastroesophageal reflux disease)    diet controlled, no  meds   Glaucoma    left eye    Hx of radiation therapy 03/22/11 to 05/08/11   PSA recurrent carcinoma of prostate   Hypercholesterolemia    Hypertension    Macular degeneration of both eyes    Peripheral vascular disease ([place]) 07/2011   Bilateral SFA stenosis: Status post right femoropopliteal bypass - Dr. 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 mm x 8 mm fusiform aneurysm    Prostate cancer ([place]) 2004   Renal artery stenosis (HCC)    70% left   Stones in the urinary tract    Stroke [facility])    Urinary incontinence    Past Surgical History:  Procedure Laterality Date   ABDOMINAL AORTAGRAM N/A 06/20/2011   Procedure: ABDOMINAL Maxcine Ham;  Surgeon: Serafina Mitchell, MD;  Location: [facility] CATH LAB;  Service: Cardiovascular;  Laterality: N/A;   ABDOMINAL AORTOGRAM W/LOWER EXTREMITY N/A 12/20/2020   Procedure: ABDOMINAL AORTOGRAM W/LOWER EXTREMITY;  Surgeon: Waynetta Sandy, MD;  Location: [place] CV LAB;  Service: Cardiovascular;  Laterality: N/A;   AMPUTATION Right 12/28/2020   Procedure: RIGHT 4TH TOE AMPUTATION;  Surgeon: Waynetta Sandy, MD;  Location: [place];  Service: Vascular;  Laterality: Right;   CARDIOVASCULAR STRESS TEST  07/06/2011   Evidence of mild ischemia in Basal Inferior and Mid Inferior regions, no significant wall motion abnormalities noted.   clamp to penis     for urinary control   COLONOSCOPY     COLONOSCOPY W/ BIOPSIES     multiple    CYSTOSCOPY  W/ URETERAL STENT PLACEMENT Left 06/07/2015   Procedure: CYSTOSCOPY, RETROGRADE PYLEGRAM  WITH STENT PLACEMENT;  Surgeon: Bjorn Loser, MD;  Location: [facility] ORS;  Service: Urology;  Laterality: Left;   CYSTOSCOPY WITH RETROGRADE PYELOGRAM, URETEROSCOPY AND STENT PLACEMENT Left 07/28/2015   Procedure: CYSTOSCOPY, URETEROSCOPY, WITH BASKET STONE REMOVAL;  Surgeon: Raynelle Bring, MD;  Location: [facility] OR[facility];  Service: Urology;  Laterality: Left;   ENDARTERECTOMY FEMORAL Right  12/21/2020   Procedure: RIGHT FEMORAL ENDARTERECTOMY;  Surgeon: Waynetta Sandy, MD;  Location: [place];  Service: Vascular;  Laterality: Right;   EYE SURGERY Right 08/2014   Cataract; removed bilat   FEMORAL-POPLITEAL BYPASS GRAFT  07/19/2011   Procedure: BYPASS GRAFT FEMORAL-POPLITEAL ARTERY;  Surgeon: Mal Misty, MD;  Location: [place];  Service: Vascular;  Laterality: Right;  Right Femoral - Popliteal  Bypss with saphenous vein   FEMORAL-POPLITEAL BYPASS GRAFT Right 12/21/2020   Procedure: RIGHT FEMORAL-POPLITEAL ARTERY BYPASS GRAFT WITH PTFE;  Surgeon: Waynetta Sandy, MD;  Location: [place];  Service: Vascular;  Laterality: Right;   HERNIA REPAIR     right side,lft   INTRAOPERATIVE ARTERIOGRAM  07/19/2011   Procedure: INTRA OPERATIVE ARTERIOGRAM;  Surgeon: Mal Misty, MD;  Location: [place];  Service: Vascular;  Laterality: Right;   NM MYOVIEW LTD  07/2011   Normal EF, very small area of basal-mid inferior ischemia; LOW RISK   PENILE PROSTHESIS  REMOVAL     PENILE PROSTHESIS IMPLANT     PR VEIN BYPASS GRAFT,AORTO-FEM-POP  07/19/2011   Right  Fem/Pop BPG   PROSTATE BIOPSY     prostatectomy retropubic radical  07/15/2002   with nerve sparing, Gleason 3+4=7   Reclast  07/25/2013   RENAL DUPLEX  07/01/2012   Left renal artery 60-99% diameter reduction   TRANSTHORACIC ECHOCARDIOGRAM  01/2017   Normal EF and overall function   Family History  Problem Relation Age of Onset   Prostate cancer Brother 36   Heart disease Brother        Before age 60   Hypertension Brother    Heart attack Brother    Aneurysm Father    Heart disease Father    Hypertension Father    Heart disease Mother        Before age 31   Hypertension Mother    Heart attack Mother    Heart disease Sister    Hypertension Sister    Heart attack Sister    Colon cancer Neg Hx    Esophageal cancer Neg Hx    Stomach cancer Neg Hx    Rectal cancer Neg Hx    Colon polyps Neg Hx     Social  History   Tobacco Use   Smoking status: Former    Packs/day: 3.00    Years: 30.00    Pack years: 90.00    Types: Cigarettes    Quit date: 07/01/2002    Years since quitting: 18.7   Smokeless tobacco: Never  Substance Use Topics   Alcohol use: No   Marital Status: Married  ROS  Review of Systems  Cardiovascular:  Positive for claudication (left leg) and leg swelling (right leg since Fem-Pop bypass Nov 2022). Negative for chest pain and dyspnea on exertion.  Objective  Blood pressure 110/78, pulse 82, temperature (!) 97.2 F (36.2 C), temperature source Temporal, resp. rate 16, height 5' 7"  (1.702 m), weight 162 lb (73.5 kg), SpO2 95 %. Body mass index is 25.37 kg/m.  Vitals with BMI 03/16/2021 01/19/2021  12/30/2020  Height 5' 7"  5' 7"  -  Weight 162 lbs 164 lbs 5 oz -  BMI 73.41 93.79 -  Systolic 024 097 353  Diastolic 78 79 56  Pulse 82 82 77    Physical Exam Neck:     Vascular: No carotid bruit or JVD.  Cardiovascular:     Rate and Rhythm: Normal rate and regular rhythm.     Pulses: Intact distal pulses.          Carotid pulses are 2+ on the right side and 2+ on the left side.      Femoral pulses are 2+ on the right side and 1+ on the left side.      Popliteal pulses are 2+ on the right side and 0 on the left side.       Dorsalis pedis pulses are 2+ on the right side and 0 on the left side.       Posterior tibial pulses are 0 on the right side and 0 on the left side.     Heart sounds: Normal heart sounds. No murmur heard.   No gallop.  Pulmonary:     Effort: Pulmonary effort is normal.     Breath sounds: Normal breath sounds.  Abdominal:     General: Bowel sounds are normal.     Palpations: Abdomen is soft.  Musculoskeletal:     Right lower leg: No edema (2+ pitting below knee).     Left lower leg: No edema.     Medications and allergies   Allergies  Allergen Reactions   Avelox [Moxifloxacin Hcl In Nacl] Diarrhea   Moxifloxacin Diarrhea   Cilostazol Diarrhea    Cyclobenzaprine Other (See Comments)   Quinolones     Other reaction(s): Unknown   Ramipril     Other reaction(s): Unknown     Medication list after today's encounter    Current Outpatient Medications:    acetaminophen (TYLENOL) 500 MG tablet, Take 500 mg by mouth every 6 (six) hours as needed for mild pain or headache., Disp: , Rfl:    aspirin 81 MG tablet, Take 81 mg by mouth at bedtime., Disp: , Rfl:    budesonide-formoterol (SYMBICORT) 80-4.5 MCG/ACT inhaler, Inhale 2 puffs into the lungs 2 (two) times daily as needed., Disp: 1 each, Rfl: 2   Calcium Carbonate-Vitamin D (CALTRATE 600+D PO), Take 1 tablet by mouth daily. , Disp: , Rfl:    Cyanocobalamin (VITAMIN B-12) 2500 MCG SUBL, Place 2,500 mcg under the tongue daily., Disp: , Rfl:    docusate sodium (COLACE) 100 MG capsule, Take 1 capsule (100 mg total) by mouth daily as needed for mild constipation., Disp: , Rfl: 0   ferrous sulfate 325 (65 FE) MG tablet, Take 1 tablet (325 mg total) by mouth daily with breakfast., Disp: 30 tablet, Rfl: 1   fluticasone (FLONASE) 50 MCG/ACT nasal spray, Place 2 sprays into both nostrils daily as needed for rhinitis or allergies (or nasal congestion)., Disp: , Rfl:    latanoprost (XALATAN) 0.005 % ophthalmic solution, Place 1 drop into the left eye at bedtime. , Disp: , Rfl:    losartan-hydrochlorothiazide (HYZAAR) 100-12.5 MG tablet, Take 1 tablet by mouth daily., Disp: , Rfl:    LUTEIN PO, Take 1 capsule by mouth daily., Disp: , Rfl:    metoprolol succinate (TOPROL-XL) 25 MG 24 hr tablet, TAKE 1 TABLET (25 MG TOTAL) BY MOUTH DAILY. ** DO NOT CRUSH ** (BETA BLOCKER) (Patient taking differently: Take 25 mg by  mouth See admin instructions. Take 25 mg by mouth at bedtime ** DO NOT CRUSH **    (BETA BLOCKER)), Disp: 90 tablet, Rfl: 3   Multiple Vitamin (MULTIVITAMIN WITH MINERALS) TABS tablet, Take 1 tablet by mouth daily., Disp: 30 tablet, Rfl: 0   Omega-3 Fatty Acids (FISH OIL) 1200 MG CAPS, Take 3,600  mg by mouth daily., Disp: , Rfl:    rosuvastatin (CRESTOR) 10 MG tablet, TAKE 1 TABLET BY MOUTH EVERY DAY (Patient taking differently: Take 10 mg by mouth at bedtime.), Disp: 90 tablet, Rfl: 3   Turmeric (QC TUMERIC COMPLEX PO), Take 1,000 Units by mouth daily., Disp: , Rfl:    vitamin B-12 (CYANOCOBALAMIN) 500 MCG tablet, Take 1 tablet (500 mcg total) by mouth daily., Disp: 30 tablet, Rfl: 0   amLODipine (NORVASC) 5 MG tablet, Take 0.5 tablets (2.5 mg total) by mouth at bedtime., Disp: , Rfl:   Laboratory examination:   Recent Labs    12/23/20 0112 12/24/20 0206 12/28/20 0751 12/29/20 0135  NA 134* 133* 136 133*  K 3.9 4.2 4.5 3.8  CL 106 102 102 101  CO2 23 24  --  27  GLUCOSE 119* 109* 114* 113*  BUN 19 17 31* 27*  CREATININE 1.17 1.26* 1.40* 1.36*  CALCIUM 7.8* 7.8*  --  8.1*  GFRNONAA >60 58*  --  53*   CrCl cannot be calculated (Patient's most recent lab result is older than the maximum 21 days allowed.).  CMP Latest Ref Rng & Units 12/29/2020 12/28/2020 12/24/2020  Glucose 70 - 99 mg/dL 113(H) 114(H) 109(H)  BUN 8 - 23 mg/dL 27(H) 31(H) 17  Creatinine 0.61 - 1.24 mg/dL 1.36(H) 1.40(H) 1.26(H)  Sodium 135 - 145 mmol/L 133(L) 136 133(L)  Potassium 3.5 - 5.1 mmol/L 3.8 4.5 4.2  Chloride 98 - 111 mmol/L 101 102 102  CO2 22 - 32 mmol/L 27 - 24  Calcium 8.9 - 10.3 mg/dL 8.1(L) - 7.8(L)  Total Protein 6.5 - 8.1 g/dL - - -  Total Bilirubin 0.3 - 1.2 mg/dL - - -  Alkaline Phos 38 - 126 U/L - - -  AST 15 - 41 U/L - - -  ALT 0 - 44 U/L - - -   CBC Latest Ref Rng & Units 12/30/2020 12/29/2020 12/28/2020  WBC 4.0 - 10.5 K/uL 4.3 4.1 -  Hemoglobin 13.0 - 17.0 g/dL 8.4(L) 7.7(L) 9.5(L)  Hematocrit 39.0 - 52.0 % 25.9(L) 23.7(L) 28.0(L)  Platelets 150 - 400 K/uL 185 168 -    Lipid Panel Recent Labs    12/22/20 0126  CHOL 81  TRIG 52  LDLCALC 34  VLDL 10  HDL 37*  CHOLHDL 2.2  HEMOGLOBIN A1C No results found for: HGBA1C, MPG TSH No results for input(s): TSH in the last  8760 hours.  External labs:   Labs 01/06/2021:  Vitamin B12 855.  Sodium 140, potassium 4.5, serum glucose 97 mg, p.o. 26, creatinine 1.21, EGFR 56 mL, LFTs normal.  Iron studies normal.  Hb 10.2/HCT 32.1, platelets 299.  Labs 12/06/2020:  Total cholesterol 129, triglycerides 114, HDL 50, LDL 56.  Vitamin D 31.5, A1c 5.8%.  Hb 13.6/HCT 39.1, platelets 152.  Radiology:  MRI angio head 09/13/2020, comparison 08/26/2019:  1. Unchanged fusiform aneurysm involving the basilar tip and right P1 segment. 2. Unchanged appearance of bilateral ICA occlusion with intracranial reconstitution.  Cardiac Studies:   Renal artery duplex 12/15/2019: Right: Normal size right kidney. Abnormal right Resistive Index.  Abnormal cortical thickness of right kidney. No evidence of         right renal artery stenosis. RRV flow present.  Right kidney measures 10.80 cm Left:  Abnormal size for the left kidney. Abnormal left Resistive         Index. Abnormal cortical thickness of the left kidney. No         evidence of left renal artery stenosis. Left kidney measures 7.30 cm  ABI 01/14/2021: Right: Resting right ankle-brachial index is within normal range. The  right toe-brachial index is abnormal.  Right ABI of 1.03 and TBI of 0.54.  Left: Resting left ankle-brachial index indicates moderate left lower  extremity arterial disease. The left toe-brachial index is abnormal.  Left ABI 0.58, TBI absent.  Peripheral arteriogram 12/20/2020: Findings: Left common femoral artery appeared to be occluded I was able to cannulate this just at the top prior to a collateral and get access.  Performed aortogram which demonstrated heavily calcified aorta and bilateral common and external iliac arteries.  I then crossed over to the common iliac artery on the right where I had collaterals to fill the right lower extremity.  I performed angiography was demonstrated occluded right common femoral with long segment  occlusion of his SFA which was heavily calcified reconstitutes at the level of the knee where it is diseased below the knee is much less diseased appears to have runoff via the anterior tibial and peroneal arteries.  Plan will be for right common femoral endarterectomy and femoral to below-knee popliteal artery bypass which will likely need to be performed with graft given the vein has been harvested in the past for bypass.  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  EKG:   EKG 03/16/2021: Sinus rhythm with first-degree block at rate of 74 bpm, left atrial enlargement, incomplete right bundle branch block.  Otherwise normal EKG.    Assessment     ICD-10-CM   1. PAD (peripheral artery disease) (HCC)  I73.9 EKG 12-Lead    Apo A1 + B + Ratio    Lipoprotein A (LPA)    PCV MYOCARDIAL PERFUSION WITH LEXISCAN    2. Atherosclerosis of native artery of both lower extremities with intermittent claudication ([place])  I70.213     3. Essential hypertension  I10 PCV ECHOCARDIOGRAM COMPLETE    amLODipine (NORVASC) 5 MG tablet    4. Hyperlipidemia LDL goal <70  E78.5        Medications Discontinued During This Encounter  Medication Reason   nicotine (NICODERM CQ - DOSED IN MG/24 HOURS) 14 mg/24hr patch    HYDROcodone-acetaminophen (NORCO/VICODIN) 5-325 MG tablet    amLODipine (NORVASC) 5 MG tablet Reorder    No orders of the defined types were placed in this encounter.  Orders Placed This Encounter  Procedures   Apo A1 + B + Ratio   Lipoprotein A (LPA)   PCV MYOCARDIAL PERFUSION WITH LEXISCAN    Standing Status:   Future    Standing Expiration Date:   05/14/2021   EKG 12-Lead   PCV ECHOCARDIOGRAM COMPLETE    Standing Status:   Future    Standing Expiration Date:   03/16/2022   Recommendations:   Lance Dillon is a 81 y.o.  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 Quay Burow, wants to establish with me.  Past medical history significant for hypertension, hyperlipidemia, chronic stage IIIa creatinine disease, chronic anemia and remote tobacco use disorder with a 90-pack-year history quit in 2004.  Patient wants to establish with me. He is concerned about coronary artery disease in view of significant peripheral arterial disease.   Extremely complex vascular history.   He has had right lower extremity arterial bypass in Nov 2022 without cardiac complications a hence significant CAD is probably less likely and also his risk factors are well controlled including hypertension, hyperlipidemia.  However he has not had any screening for >5 years, last cardiac evaluation was 2013.  We will schedule him for Lexiscan nuclear stress test and also will obtain an echocardiogram as a baseline.  He needs advanced lipid profile testing in view of extensive vascular history.  With regard to right lower extremity edema, no clinical evidence of DVT.  The right leg edema is probably related to increased vascular flow, he has excellent pedal pulses, however amlodipine could be contributing to his edema.  Left leg is not swollen because of severe PAD and less blood flow to his left leg.  Symptoms of claudication overall is improved since right lower extremity bypass surgery and is got very mild none lifestyle limiting claudication involving left calf, he will continue to follow with Dr. [name].  With regard to renal artery stenosis, he has atrophic left kidney.  No further evaluation is indicated.  Advised him that I had seen slight increased renal artery to aortic ratio  on the right, he has already been scheduled for renal artery duplex, I will follow this and see if there has been any change and if there is no further change in velocity across the right artery, no repeat follow-up duplex is indicated.  As long as renal function remains stable and blood pressures well controlled no evaluation is indicated at this time.  I would like to see him back in 6 weeks for follow-up.    Adrian Prows, MD, [facility] 03/16/2021, 4:54 PM Office: [phone]

## 2021-03-18 ENCOUNTER — Other Ambulatory Visit: Payer: Self-pay

## 2021-03-18 ENCOUNTER — Ambulatory Visit: Payer: Medicare HMO

## 2021-03-18 DIAGNOSIS — I10 Essential (primary) hypertension: Secondary | ICD-10-CM

## 2021-03-18 DIAGNOSIS — M7989 Other specified soft tissue disorders: Secondary | ICD-10-CM

## 2021-03-22 DIAGNOSIS — H401122 Primary open-angle glaucoma, left eye, moderate stage: Secondary | ICD-10-CM | POA: Diagnosis not present

## 2021-03-22 DIAGNOSIS — H353133 Nonexudative age-related macular degeneration, bilateral, advanced atrophic without subfoveal involvement: Secondary | ICD-10-CM | POA: Diagnosis not present

## 2021-03-22 DIAGNOSIS — E785 Hyperlipidemia, unspecified: Secondary | ICD-10-CM | POA: Diagnosis not present

## 2021-03-22 DIAGNOSIS — I739 Peripheral vascular disease, unspecified: Secondary | ICD-10-CM | POA: Diagnosis not present

## 2021-03-22 DIAGNOSIS — Z961 Presence of intraocular lens: Secondary | ICD-10-CM | POA: Diagnosis not present

## 2021-03-23 LAB — APO A1 + B + RATIO
Apolipo. B/A-1 Ratio: 0.4 ratio (ref 0.0–0.7)
Apolipoprotein A-1: 161 mg/dL (ref 101–178)
Apolipoprotein B: 70 mg/dL (ref <90)

## 2021-03-23 LAB — LIPOPROTEIN A (LPA): Lipoprotein (a): 16.6 nmol/L (ref <75.0)

## 2021-03-23 NOTE — Progress Notes (Signed)
Labs 03/23/2021:  Lipoprotein (a) <75.0 nmol/L  16.6  (<75)  Apo A1 + B + Ratio     0.4   (0.00 to 0.7)

## 2021-03-26 ENCOUNTER — Encounter: Payer: Self-pay | Admitting: Cardiology

## 2021-03-29 DIAGNOSIS — J441 Chronic obstructive pulmonary disease with (acute) exacerbation: Secondary | ICD-10-CM | POA: Diagnosis not present

## 2021-03-29 DIAGNOSIS — N1831 Chronic kidney disease, stage 3a: Secondary | ICD-10-CM | POA: Diagnosis not present

## 2021-03-29 DIAGNOSIS — I129 Hypertensive chronic kidney disease with stage 1 through stage 4 chronic kidney disease, or unspecified chronic kidney disease: Secondary | ICD-10-CM | POA: Diagnosis not present

## 2021-03-29 DIAGNOSIS — M810 Age-related osteoporosis without current pathological fracture: Secondary | ICD-10-CM | POA: Diagnosis not present

## 2021-04-05 ENCOUNTER — Other Ambulatory Visit: Payer: Self-pay

## 2021-04-05 ENCOUNTER — Ambulatory Visit: Payer: Medicare HMO

## 2021-04-05 DIAGNOSIS — I708 Atherosclerosis of other arteries: Secondary | ICD-10-CM | POA: Diagnosis not present

## 2021-04-05 DIAGNOSIS — I739 Peripheral vascular disease, unspecified: Secondary | ICD-10-CM | POA: Diagnosis not present

## 2021-04-05 DIAGNOSIS — I6521 Occlusion and stenosis of right carotid artery: Secondary | ICD-10-CM

## 2021-04-05 DIAGNOSIS — I70213 Atherosclerosis of native arteries of extremities with intermittent claudication, bilateral legs: Secondary | ICD-10-CM | POA: Diagnosis not present

## 2021-04-19 NOTE — Progress Notes (Signed)
[facility] [place] ?HISTORY AND PHYSICAL  ? ?History of Present Illness:  Patient is a 81 y.o. year old male who presents for evaluation of claudication. With history of ischemic limb and fourth toe ulcer.    He is s/p right common femoral endarterectomy with redo femoral to below the knee popliteal bypass with PTFE by Dr. Donzetta Matters on 12/21/2020.   This was followed by fourth toe amputation by Dr. Donzetta Matters on 12/28/2020.   ? He was last seen 01/19/21.  At that time the 4th toe amputation site was healing well.  He had noted the right LE was edematous, but had right palpable DP with patent bypass.   ?He has known PAD with claudication on the left LE and has been offered bypass.  He states the symptoms are tolerable.   ? ? Known right ICA occlusion and previous left ICA < 39% stenosis. He denies symptoms of stroke to include amaurosis, weakness or aphasia.  He has known right ICA occlusion.  He denies history of CVA. ? ?Past Medical History:  ?Diagnosis Date  ? Anemia   ? Arthritis   ? Atherosclerosis of aortic bifurcation and common iliac arteries ([place]) 03/2015  ? Noted on abdominal/renal Dopplers  ? Blind right eye   ? 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 Bilateral: Noted on MRA of brain in November 2015 with collateral flow from posterior circulation and external collaterals reconstituting anterior circulation  ? Chronic kidney disease   ? Colon polyps 2004, 07, 14  ? COPD (chronic obstructive pulmonary disease) ([place])   ? GERD (gastroesophageal reflux disease)   ? diet controlled, no meds  ? Glaucoma   ? left eye   ? Hx of radiation therapy 03/22/11 to 05/08/11  ? PSA recurrent carcinoma of prostate  ? Hypercholesterolemia   ? Hypertension   ? Macular degeneration of both eyes   ? Peripheral vascular disease ([place]) 07/2011  ? 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 Kellie Simmering  ? 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 Right-sided 19 mm x 8 mm fusiform aneurysm    ? Prostate cancer [facility]) 2004  ? Renal artery stenosis ([place])   ? 70% left  ? Stones in the urinary tract   ? Stroke [facility])   ? Urinary incontinence   ? ? ?Past Surgical History:  ?Procedure Laterality Date  ? ABDOMINAL AORTAGRAM N/A 06/20/2011  ? Procedure: ABDOMINAL AORTAGRAM;  Surgeon: Serafina Mitchell, MD;  Location: [facility] CATH LAB;  Service: Cardiovascular;  Laterality: N/A;  ? ABDOMINAL AORTOGRAM W/LOWER EXTREMITY N/A 12/20/2020  ? Procedure: ABDOMINAL AORTOGRAM W/LOWER EXTREMITY;  Surgeon: Waynetta Sandy, MD;  Location: [place] CV LAB;  Service: Cardiovascular;  Laterality: N/A;  ? AMPUTATION Right 12/28/2020  ? Procedure: RIGHT 4TH TOE AMPUTATION;  Surgeon: Waynetta Sandy, MD;  Location: [place];  Service: Vascular;  Laterality: Right;  ? CARDIOVASCULAR STRESS TEST  07/06/2011  ? Evidence of mild ischemia in Basal Inferior and Mid Inferior regions, no significant wall motion abnormalities noted.  ? clamp to penis    ? for urinary control  ? COLONOSCOPY    ? COLONOSCOPY W/ BIOPSIES    ? multiple   ? CYSTOSCOPY W/ URETERAL STENT PLACEMENT Left 06/07/2015  ? Procedure: CYSTOSCOPY, RETROGRADE PYLEGRAM  WITH STENT PLACEMENT;  Surgeon: Bjorn Loser, MD;  [facility]: [facility];  Service: Urology;  Laterality: Left;  ? CYSTOSCOPY WITH RETROGRADE PYELOGRAM, URETEROSCOPY AND STENT PLACEMENT Left 07/28/2015  ? Procedure:  CYSTOSCOPY, URETEROSCOPY, WITH BASKET STONE REMOVAL;  Surgeon: Raynelle Bring, MD;  Location: [facility];  Service: Urology;  Laterality: Left;  ? ENDARTERECTOMY FEMORAL Right 12/21/2020  ? Procedure: RIGHT FEMORAL ENDARTERECTOMY;  Surgeon: Waynetta Sandy, MD;  Location: [place];  Service: Vascular;  Laterality: Right;  ? EYE SURGERY Right 08/2014  ? Cataract; removed bilat  ? FEMORAL-POPLITEAL BYPASS GRAFT  07/19/2011  ? Procedure: BYPASS GRAFT FEMORAL-POPLITEAL ARTERY;  Surgeon: Mal Misty, MD;  Location: [facility] OR;  Service: Vascular;  Laterality: Right;  Right Femoral -  Popliteal  Bypss with saphenous vein  ? FEMORAL-POPLITEAL BYPASS GRAFT Right 12/21/2020  ? Procedure: RIGHT FEMORAL-POPLITEAL ARTERY BYPASS GRAFT WITH PTFE;  Surgeon: Waynetta Sandy, MD;  Location: [place];  Service: Vascular;  Laterality: Right;  ? HERNIA REPAIR    ? right side,lft  ? INTRAOPERATIVE ARTERIOGRAM  07/19/2011  ? Procedure: INTRA OPERATIVE ARTERIOGRAM;  Surgeon: Mal Misty, MD;  Location: [place];  Service: Vascular;  Laterality: Right;  ? NM MYOVIEW LTD  07/2011  ? Normal EF, very small area of basal-mid inferior ischemia; LOW RISK  ? PENILE PROSTHESIS  REMOVAL    ? PENILE PROSTHESIS IMPLANT    ? PR VEIN BYPASS GRAFT,AORTO-FEM-POP  07/19/2011  ? Right  Fem/Pop BPG  ? PROSTATE BIOPSY    ? prostatectomy retropubic radical  07/15/2002  ? with nerve sparing, Gleason 3+4=7  ? Reclast  07/25/2013  ? RENAL DUPLEX  07/01/2012  ? Left renal artery 60-99% diameter reduction  ? TRANSTHORACIC ECHOCARDIOGRAM  01/2017  ? Normal EF and overall function  ? ? ?ROS:  ? ?General:  No weight loss, Fever, chills ? ?HEENT: No recent headaches, no nasal bleeding, no visual changes, no sore throat ? ?Neurologic: No dizziness, blackouts, seizures. No recent symptoms of stroke or mini- stroke. No recent episodes of slurred speech, or temporary blindness. ? ?Cardiac: No recent episodes of chest pain/pressure, no shortness of breath at rest.  No shortness of breath with exertion.  Denies history of atrial fibrillation or irregular heartbeat ? ?Vascular: No history of rest pain in feet.  No history of claudication.  No history of non-healing ulcer, No history of DVT  ? ?Pulmonary: No home oxygen, no productive cough, no hemoptysis,  No asthma or wheezing ? ?Musculoskeletal:  '[ ]'$  Arthritis, '[ ]'$  Low back pain,  '[ ]'$  Joint pain ? ?Hematologic:No history of hypercoagulable state.  No history of easy bleeding.  No history of anemia ? ?Gastrointestinal: No hematochezia or melena,  No gastroesophageal reflux, no trouble  swallowing ? ?Urinary: '[ ]'$  chronic Kidney disease, '[ ]'$  on HD - '[ ]'$  MWF or '[ ]'$  TTHS, '[ ]'$  Burning with urination, '[ ]'$  Frequent urination, '[ ]'$  Difficulty urinating;  ? ?Skin: No rashes ? ?Psychological: No history of anxiety,  No history of depression ? ?Social History ?Social History  ? ?Tobacco Use  ? Smoking status: Former  ?  Packs/day: 3.00  ?  Years: 30.00  ?  Pack years: 90.00  ?  Types: Cigarettes  ?  Quit date: 07/01/2002  ?  Years since quitting: 18.8  ? Smokeless tobacco: Never  ?Vaping Use  ? Vaping Use: Never used  ?Substance Use Topics  ? Alcohol use: No  ? Drug use: No  ? ? ?Family History ?Family History  ?Problem Relation Age of Onset  ? Prostate cancer Brother 37  ? Heart disease Brother   ?     Before age 64  ?  Hypertension Brother   ? Heart attack Brother   ? Aneurysm Father   ? Heart disease Father   ? Hypertension Father   ? Heart disease Mother   ?     Before age 41  ? Hypertension Mother   ? Heart attack Mother   ? Heart disease Sister   ? Hypertension Sister   ? Heart attack Sister   ? Colon cancer Neg Hx   ? Esophageal cancer Neg Hx   ? Stomach cancer Neg Hx   ? Rectal cancer Neg Hx   ? Colon polyps Neg Hx   ? ? ?Allergies ? ?Allergies  ?Allergen Reactions  ? Avelox [Moxifloxacin Hcl In Nacl] Diarrhea  ? Moxifloxacin Diarrhea  ? Cilostazol Diarrhea  ? Cyclobenzaprine Other (See Comments)  ? Quinolones   ?  Other reaction(s): Unknown  ? Ramipril   ?  Other reaction(s): Unknown  ? ? ? ?Current Outpatient Medications  ?Medication Sig Dispense Refill  ? acetaminophen (TYLENOL) 500 MG tablet Take 500 mg by mouth every 6 (six) hours as needed for mild pain or headache.    ? amLODipine (NORVASC) 5 MG tablet Take 0.5 tablets (2.5 mg total) by mouth at bedtime.    ? aspirin 81 MG tablet Take 81 mg by mouth at bedtime.    ? budesonide-formoterol (SYMBICORT) 80-4.5 MCG/ACT inhaler Inhale 2 puffs into the lungs 2 (two) times daily as needed. 1 each 2  ? Calcium Carbonate-Vitamin D (CALTRATE 600+D PO) Take 1  tablet by mouth daily.     ? Cyanocobalamin (VITAMIN B-12) 2500 MCG SUBL Place 2,500 mcg under the tongue daily.    ? docusate sodium (COLACE) 100 MG capsule Take 1 capsule (100 mg total) by mouth daily as needed

## 2021-04-20 ENCOUNTER — Other Ambulatory Visit: Payer: Self-pay

## 2021-04-20 ENCOUNTER — Encounter: Payer: Self-pay | Admitting: Physician Assistant

## 2021-04-20 ENCOUNTER — Ambulatory Visit (INDEPENDENT_AMBULATORY_CARE_PROVIDER_SITE_OTHER)
Admission: RE | Admit: 2021-04-20 | Discharge: 2021-04-20 | Disposition: A | Discharge status: Home / Self Care | Payer: Medicare HMO | Source: Ambulatory Visit | Attending: Vascular Surgery | Admitting: Vascular Surgery

## 2021-04-20 ENCOUNTER — Ambulatory Visit (INDEPENDENT_AMBULATORY_CARE_PROVIDER_SITE_OTHER)
Admission: RE | Admit: 2021-04-20 | Discharge: 2021-04-20 | Disposition: A | Discharge status: Home / Self Care | Payer: Medicare HMO | Source: Ambulatory Visit | Attending: Physician Assistant | Admitting: Physician Assistant

## 2021-04-20 ENCOUNTER — Ambulatory Visit (INDEPENDENT_AMBULATORY_CARE_PROVIDER_SITE_OTHER)
Admission: RE | Admit: 2021-04-20 | Discharge: 2021-04-20 | Disposition: A | Discharge status: Home / Self Care | Payer: Medicare HMO | Source: Ambulatory Visit | Attending: Cardiovascular Disease | Admitting: Cardiovascular Disease

## 2021-04-20 ENCOUNTER — Ambulatory Visit (HOSPITAL_COMMUNITY)
Admission: RE | Admit: 2021-04-20 | Discharge: 2021-04-20 | Disposition: A | Discharge status: Home / Self Care | Payer: Medicare HMO | Source: Ambulatory Visit | Attending: Vascular Surgery | Admitting: Vascular Surgery

## 2021-04-20 ENCOUNTER — Ambulatory Visit: Payer: Medicare HMO | Admitting: Physician Assistant

## 2021-04-20 VITALS — BP 115/71 | HR 69 | Temp 97.2°F | Ht 67.0 in | Wt 163.8 lb

## 2021-04-20 DIAGNOSIS — I739 Peripheral vascular disease, unspecified: Secondary | ICD-10-CM | POA: Insufficient documentation

## 2021-04-20 DIAGNOSIS — I6521 Occlusion and stenosis of right carotid artery: Secondary | ICD-10-CM | POA: Diagnosis not present

## 2021-04-28 ENCOUNTER — Ambulatory Visit: Payer: Medicare HMO | Admitting: Cardiology

## 2021-04-28 ENCOUNTER — Encounter: Payer: Self-pay | Admitting: Cardiology

## 2021-04-28 VITALS — BP 118/69 | HR 81 | Temp 97.3°F | Resp 16 | Ht 67.0 in | Wt 167.0 lb

## 2021-04-28 DIAGNOSIS — I70213 Atherosclerosis of native arteries of extremities with intermittent claudication, bilateral legs: Secondary | ICD-10-CM | POA: Diagnosis not present

## 2021-04-28 DIAGNOSIS — I739 Peripheral vascular disease, unspecified: Secondary | ICD-10-CM

## 2021-04-28 DIAGNOSIS — I77810 Thoracic aortic ectasia: Secondary | ICD-10-CM | POA: Diagnosis not present

## 2021-04-28 DIAGNOSIS — I701 Atherosclerosis of renal artery: Secondary | ICD-10-CM

## 2021-04-28 MED ORDER — CLOPIDOGREL BISULFATE 75 MG PO TABS: 75.0000 mg | ORAL_TABLET | Freq: Every day | ORAL | 3 refills | Status: DC

## 2021-04-28 NOTE — Addendum Note (Signed)
Addended by: Kela Millin on: 04/28/2021 09:41 AM ? ? Modules accepted: Orders ? ?

## 2021-04-28 NOTE — Progress Notes (Addendum)
? ?Primary Physician/Referring:  Deland Pretty, MD ? ?Patient ID: Lance Dillon, male    DOB: 26-Mar-1940, 81 y.o.   MRN: [id number] ? ?Chief Complaint  ?Patient presents with  ? PAD  ? Hyperlipidemia  ? Renal Artery Stenosis  ? Follow-up  ?  6 weeks  ? ?HPI:   ? ?Lance Dillon  is a 81 y.o. 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 Past medical history significant for hypertension, hyperlipidemia, chronic stage IIIa creatinine disease, chronic anemia and remote tobacco use disorder with a 90-pack-year history quit in 2004. ? ?He established with me 6 weeks ago because of concern for coronary artery disease and medical management of PAD.  He denies chest pain, dyspnea.  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  He underwent echocardiogram and also nuclear stress test and presents for follow-up.  States that his right leg edema is essentially completely resolved.  His symptoms of claudication has improved significantly on the left lower extremity as well. ? ?Past Medical History:  ?Diagnosis Date  ? Anemia   ? Arthritis   ? Atherosclerosis of aortic bifurcation and common iliac arteries ([place]) 03/2015  ? Noted on abdominal/renal Dopplers  ? Blind right eye   ? 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 Bilateral: Noted on MRA of brain in November 2015 with collateral flow from posterior circulation and external collaterals reconstituting anterior circulation  ? Chronic kidney disease   ? Colon polyps 2004, 07, 14  ? COPD (chronic obstructive pulmonary disease) ([place])   ? GERD (gastroesophageal reflux disease)   ? diet controlled, no meds  ? Glaucoma   ?  left eye   ? Hx of radiation therapy 03/22/11 to 05/08/11  ? PSA recurrent carcinoma of prostate  ? Hypercholesterolemia   ? Hypertension   ? Macular degeneration of both eyes   ? Peripheral vascular disease ([place]) 07/2011  ? 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 Kellie Simmering  ? 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 Right-sided 19 mm x 8 mm fusiform aneurysm   ? Prostate cancer [facility]) 2004  ? Renal artery stenosis ([place])   ? 70% left  ? Stones in the urinary tract   ? Stroke [facility])   ? Urinary incontinence   ? ?Past Surgical History:  ?Procedure Laterality Date  ? ABDOMINAL AORTAGRAM N/A 06/20/2011  ? Procedure: ABDOMINAL AORTAGRAM;  Surgeon: Serafina Mitchell, MD;  [facility] CATH LAB;  Service: Cardiovascular;  Laterality: N/A;  ? ABDOMINAL AORTOGRAM W/LOWER EXTREMITY N/A 12/20/2020  ? Procedure: ABDOMINAL AORTOGRAM W/LOWER EXTREMITY;  Surgeon: Waynetta Sandy, MD;  Location: [place] CV LAB;  Service: Cardiovascular;  Laterality: N/A;  ? AMPUTATION Right 12/28/2020  ? Procedure: RIGHT 4TH TOE AMPUTATION;  Surgeon: Waynetta Sandy, MD;  Location: [place];  Service: Vascular;  Laterality: Right;  ? CARDIOVASCULAR STRESS TEST  07/06/2011  ? Evidence of mild ischemia in Basal Inferior and Mid Inferior regions, no significant wall motion abnormalities noted.  ? clamp to penis    ? for urinary control  ? COLONOSCOPY    ? COLONOSCOPY W/ BIOPSIES    ? multiple   ?  CYSTOSCOPY W/ URETERAL STENT PLACEMENT Left 06/07/2015  ? Procedure: CYSTOSCOPY, RETROGRADE PYLEGRAM  WITH STENT PLACEMENT;  Surgeon: Bjorn Loser, MD;  [facility];  Service: Urology;  Laterality: Left;  ? CYSTOSCOPY WITH RETROGRADE PYELOGRAM, URETEROSCOPY AND STENT PLACEMENT Left 07/28/2015  ? Procedure: CYSTOSCOPY, URETEROSCOPY, WITH BASKET STONE REMOVAL;  Surgeon: Raynelle Bring, MD;  [facility];  Service: Urology;  Laterality: Left;  ? ENDARTERECTOMY FEMORAL Right 12/21/2020  ? Procedure: RIGHT  FEMORAL ENDARTERECTOMY;  Surgeon: Waynetta Sandy, MD;  Location: [place];  Service: Vascular;  Laterality: Right;  ? EYE SURGERY Right 08/2014  ? Cataract; removed bilat  ? FEMORAL-POPLITEAL BYPASS GRAFT  07/19/2011  ? Procedure: BYPASS GRAFT FEMORAL-POPLITEAL ARTERY;  Surgeon: Mal Misty, MD;  Location: [facility] OR;  Service: Vascular;  Laterality: Right;  Right Femoral - Popliteal  Bypss with saphenous vein  ? FEMORAL-POPLITEAL BYPASS GRAFT Right 12/21/2020  ? Procedure: RIGHT FEMORAL-POPLITEAL ARTERY BYPASS GRAFT WITH PTFE;  Surgeon: Waynetta Sandy, MD;  Location: [place];  Service: Vascular;  Laterality: Right;  ? HERNIA REPAIR    ? right side,lft  ? INTRAOPERATIVE ARTERIOGRAM  07/19/2011  ? Procedure: INTRA OPERATIVE ARTERIOGRAM;  Surgeon: Mal Misty, MD;  Location: [place];  Service: Vascular;  Laterality: Right;  ? NM MYOVIEW LTD  07/2011  ? Normal EF, very small area of basal-mid inferior ischemia; LOW RISK  ? PENILE PROSTHESIS  REMOVAL    ? PENILE PROSTHESIS IMPLANT    ? PR VEIN BYPASS GRAFT,AORTO-FEM-POP  07/19/2011  ? Right  Fem/Pop BPG  ? PROSTATE BIOPSY    ? prostatectomy retropubic radical  07/15/2002  ? with nerve sparing, Gleason 3+4=7  ? Reclast  07/25/2013  ? RENAL DUPLEX  07/01/2012  ? Left renal artery 60-99% diameter reduction  ? TRANSTHORACIC ECHOCARDIOGRAM  01/2017  ? Normal EF and overall function  ? ?Family History  ?Problem Relation Age of Onset  ? Prostate cancer Brother 88  ? Heart disease Brother   ?     Before age 31  ? Hypertension Brother   ? Heart attack Brother   ? Aneurysm Father   ? Heart disease Father   ? Hypertension Father   ? Heart disease Mother   ?     Before age 65  ? Hypertension Mother   ? Heart attack Mother   ? Heart disease Sister   ? Hypertension Sister   ? Heart attack Sister   ? Colon cancer Neg Hx   ? Esophageal cancer Neg Hx   ? Stomach cancer Neg Hx   ? Rectal cancer Neg Hx   ? Colon polyps Neg Hx   ?  ?Social History  ? ?Tobacco Use  ? Smoking  status: Former  ?  Packs/day: 3.00  ?  Years: 30.00  ?  Pack years: 90.00  ?  Types: Cigarettes  ?  Quit date: 07/01/2002  ?  Years since quitting: 18.8  ? Smokeless tobacco: Never  ?Substance Use Topics  ? Alcohol use: No  ? ?Marital Status: Married  ?ROS  ?Review of Systems  ?Cardiovascular:  Positive for claudication (left leg). Negative for chest pain, dyspnea on exertion and leg swelling.  ?Objective  ?Blood pressure 118/69, pulse 81, temperature (!) 97.3 ?F (36.3 ?C), temperature source Temporal, resp. rate 16, height '5\' 7"'$  (1.702 m), weight 167 lb (75.8 kg), SpO2 95 %. Body mass index is 26.16 kg/m?.  ? ?  04/28/2021  ?  8:51 AM 04/20/2021  ?  9:29 AM 04/20/2021  ?  9:28 AM  ?Vitals with BMI  ?Height '5\' 7"'$   '5\' 7"'$   ?Weight 167 lbs  163 lbs 13 oz  ?BMI 26.15  25.65  ?Systolic 427 062 376  ?Diastolic 69 71 77  ?Pulse 81 69 74  ?  ?Physical Exam ?Neck:  ?   Vascular: No carotid bruit or JVD.  ?Cardiovascular:  ?   Rate and Rhythm: Normal rate and regular rhythm.  ?   Pulses: Intact distal pulses.     ?     Carotid pulses are 2+ on the right side and 2+ on the left side. ?     Femoral pulses are 2+ on the right side and 1+ on the left side. ?     Popliteal pulses are 2+ on the right side and 0 on the left side.  ?     Dorsalis pedis pulses are 2+ on the right side and 0 on the left side.  ?     Posterior tibial pulses are 0 on the right side and 0 on the left side.  ?   Heart sounds: Normal heart sounds. No murmur heard. ?  No gallop.  ?Pulmonary:  ?   Effort: Pulmonary effort is normal.  ?   Breath sounds: Normal breath sounds.  ?Abdominal:  ?   General: Bowel sounds are normal.  ?   Palpations: Abdomen is soft.  ?Musculoskeletal:  ?   Right lower leg: No edema.  ?   Left lower leg: No edema.  ?  ? ?Medications and allergies  ? ?Allergies  ?Allergen Reactions  ? Avelox [Moxifloxacin Hcl In Nacl] Diarrhea  ? Moxifloxacin Diarrhea  ? Cilostazol Diarrhea  ? Cyclobenzaprine Other (See Comments)  ? Quinolones   ?  Other  reaction(s): Unknown  ? Ramipril   ?  Other reaction(s): Unknown  ?  ? ?Medication list after today's encounter  ? ? ?Current Outpatient Medications:  ?  acetaminophen (TYLENOL) 500 MG tablet, Take 5

## 2021-04-29 DIAGNOSIS — J441 Chronic obstructive pulmonary disease with (acute) exacerbation: Secondary | ICD-10-CM | POA: Diagnosis not present

## 2021-04-29 DIAGNOSIS — N1831 Chronic kidney disease, stage 3a: Secondary | ICD-10-CM | POA: Diagnosis not present

## 2021-04-29 DIAGNOSIS — I129 Hypertensive chronic kidney disease with stage 1 through stage 4 chronic kidney disease, or unspecified chronic kidney disease: Secondary | ICD-10-CM | POA: Diagnosis not present

## 2021-04-29 DIAGNOSIS — M810 Age-related osteoporosis without current pathological fracture: Secondary | ICD-10-CM | POA: Diagnosis not present

## 2021-05-02 DIAGNOSIS — Z23 Encounter for immunization: Secondary | ICD-10-CM | POA: Diagnosis not present

## 2021-05-09 ENCOUNTER — Telehealth: Payer: Self-pay

## 2021-05-09 NOTE — Telephone Encounter (Signed)
Pts wife requesting that we send in amlodipine 2.5 mg because she is having a hard time cutting the 5 mg tablets in half. Okay to send in?  ?

## 2021-05-10 ENCOUNTER — Other Ambulatory Visit: Payer: Self-pay

## 2021-05-10 DIAGNOSIS — E785 Hyperlipidemia, unspecified: Secondary | ICD-10-CM | POA: Diagnosis not present

## 2021-05-10 DIAGNOSIS — D62 Acute posthemorrhagic anemia: Secondary | ICD-10-CM | POA: Diagnosis not present

## 2021-05-10 DIAGNOSIS — I779 Disorder of arteries and arterioles, unspecified: Secondary | ICD-10-CM | POA: Diagnosis not present

## 2021-05-10 DIAGNOSIS — M810 Age-related osteoporosis without current pathological fracture: Secondary | ICD-10-CM | POA: Diagnosis not present

## 2021-05-10 DIAGNOSIS — R7303 Prediabetes: Secondary | ICD-10-CM | POA: Diagnosis not present

## 2021-05-10 DIAGNOSIS — I739 Peripheral vascular disease, unspecified: Secondary | ICD-10-CM | POA: Diagnosis not present

## 2021-05-10 DIAGNOSIS — I129 Hypertensive chronic kidney disease with stage 1 through stage 4 chronic kidney disease, or unspecified chronic kidney disease: Secondary | ICD-10-CM | POA: Diagnosis not present

## 2021-05-10 IMAGING — MR MR MRA HEAD W/O CM
1 series · 20 of 48 positions shown · non-contrast
Comparison: 06/05/2017

CLINICAL DATA: Arterial stenosis

EXAM:
MRA HEAD WITHOUT CONTRAST
TECHNIQUE: Angiographic images of the [place] were obtained using MRA
technique without intravenous contrast.

[Series 5: 3d cow · axial · 0.5mm · 0.41mm/px · z∈[-70,+8]mm · 20 of 172 slices shown]
[im 1/172]
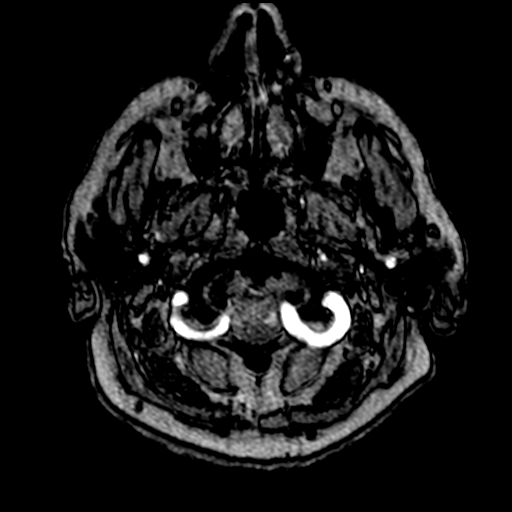
[im 4/172]
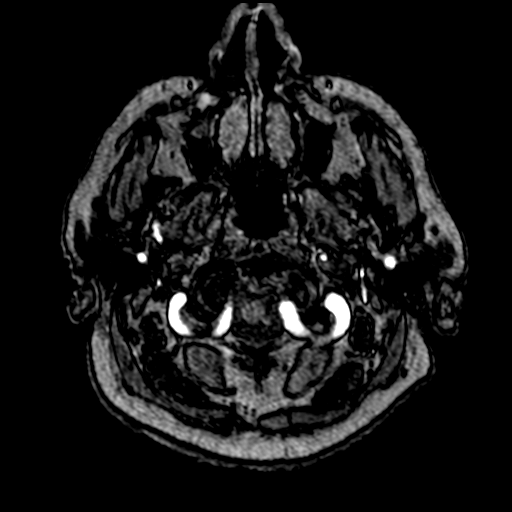
[im 8/172]
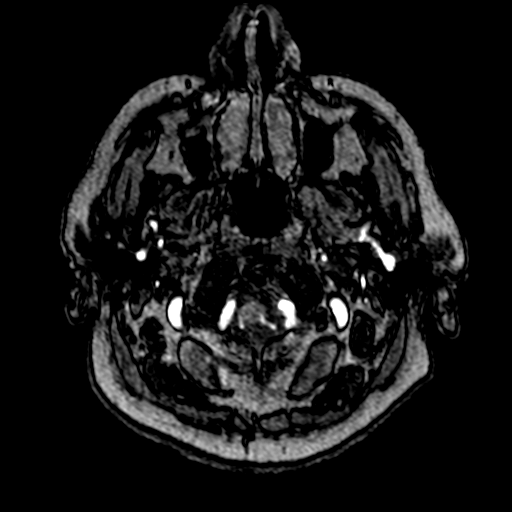
[im 11/172]
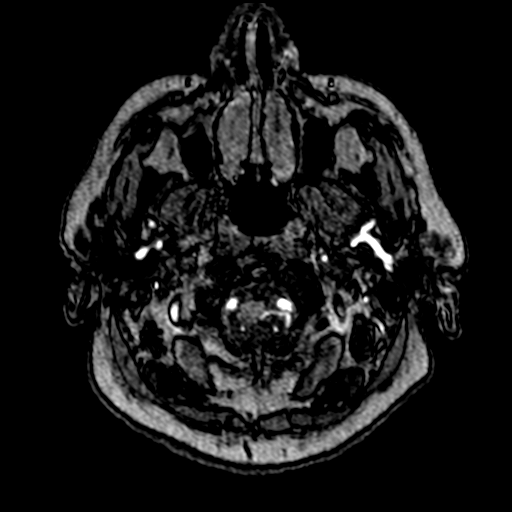
[im 15/172]
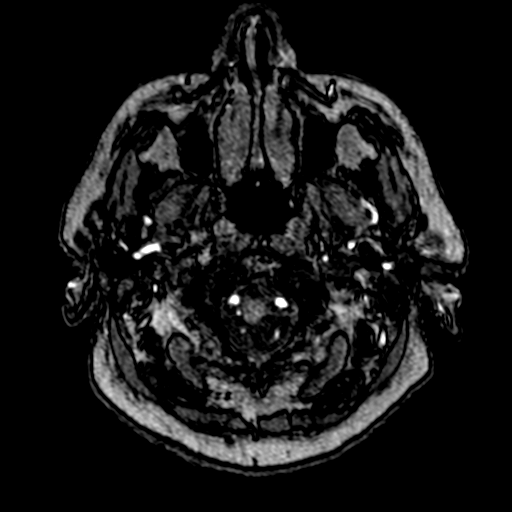
[im 19/172]
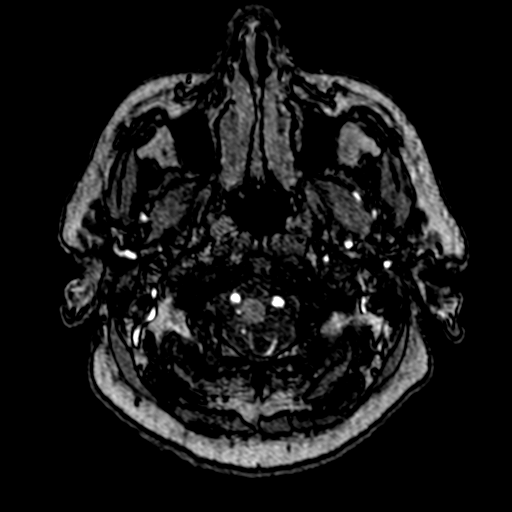
[im 22/172]
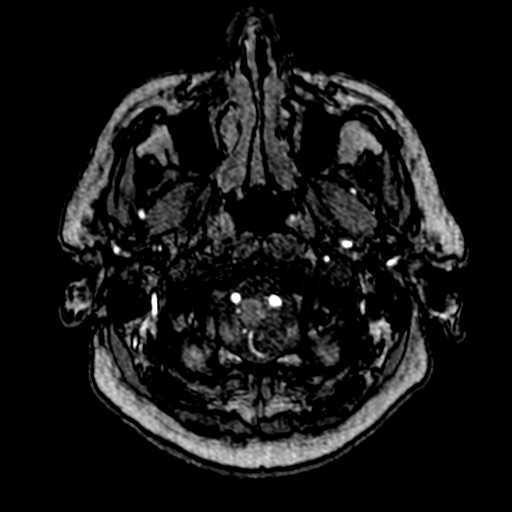
[im 26/172]
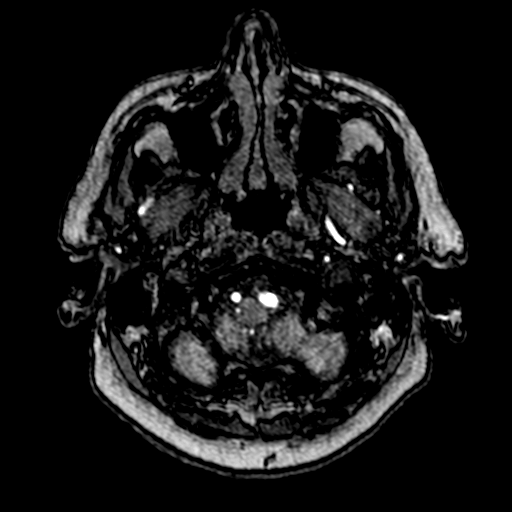
[im 30/172]
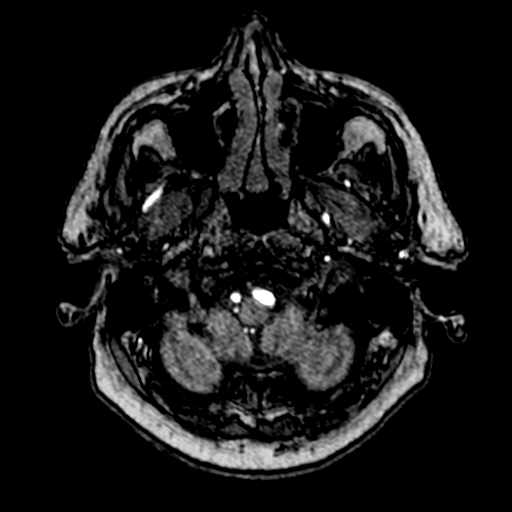
[im 33/172]
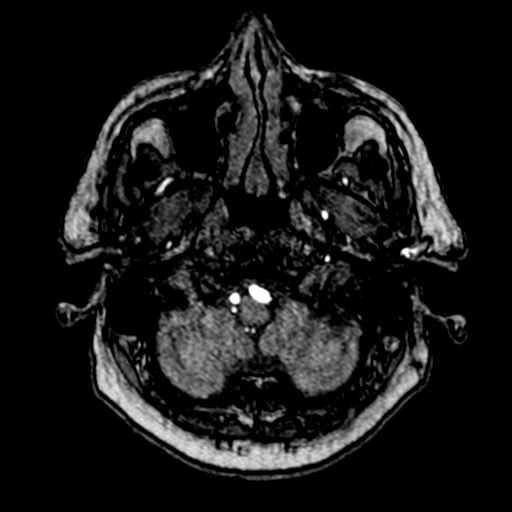
[im 37/172]
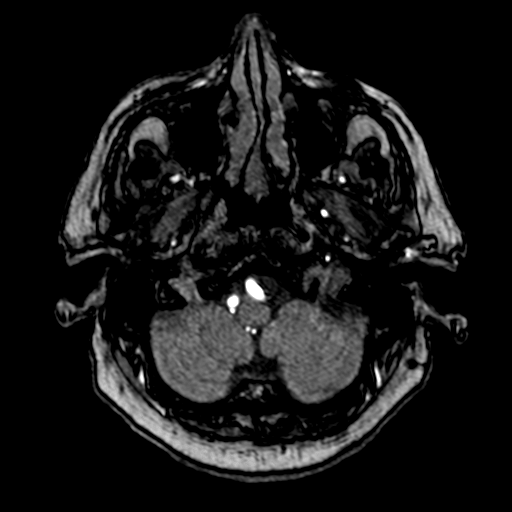
[im 41/172]
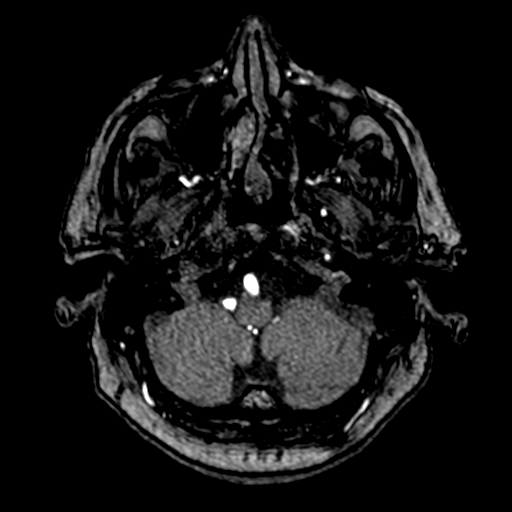
[im 55/172]
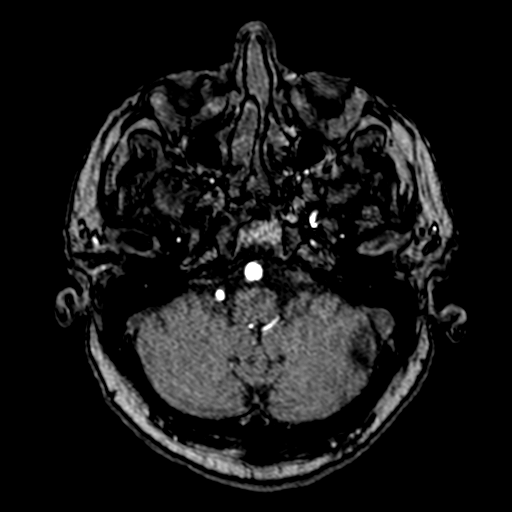
[im 77/172]
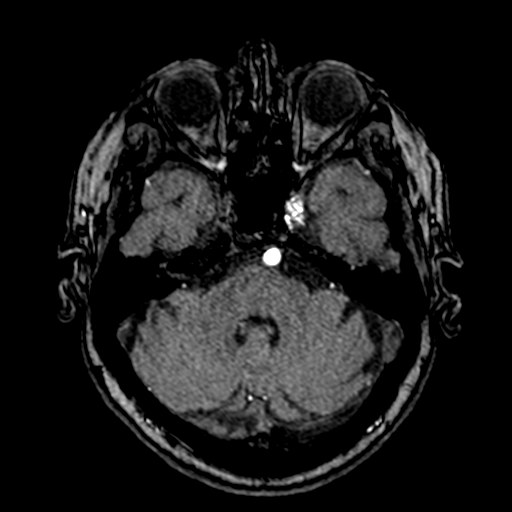
[im 88/172]
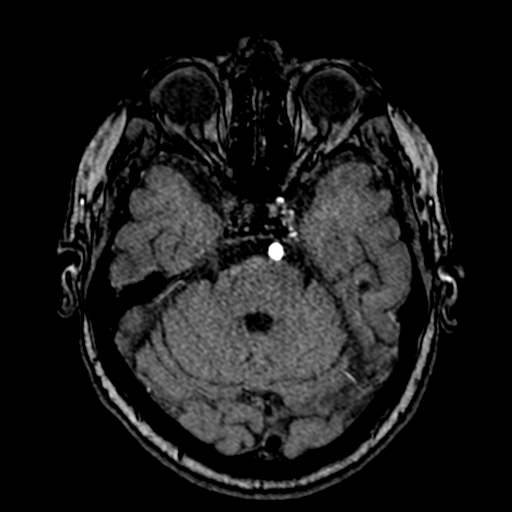
[im 99/172]
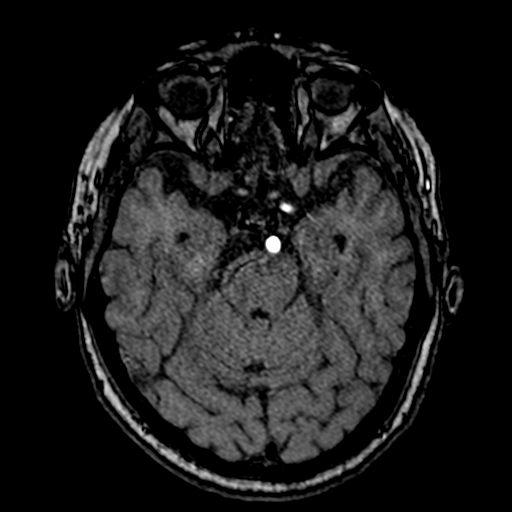
[im 121/172]
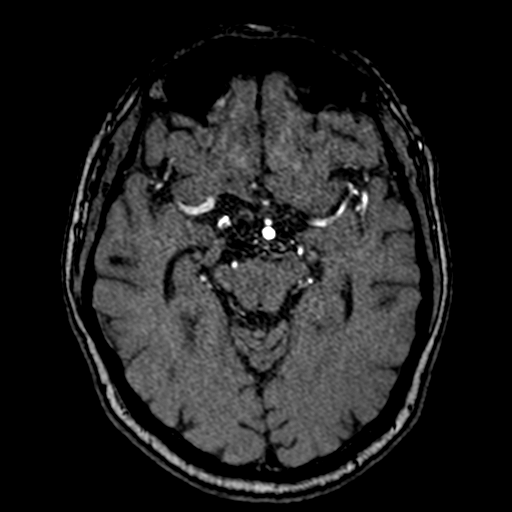
[im 142/172]
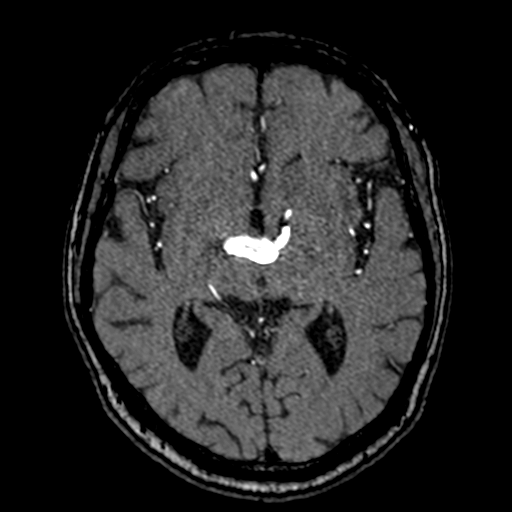
[im 146/172]
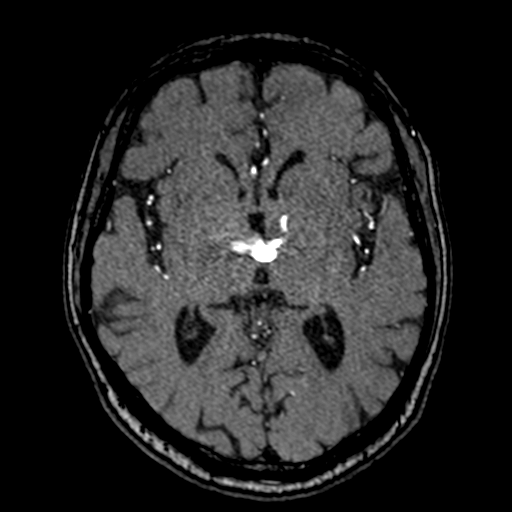
[im 164/172]
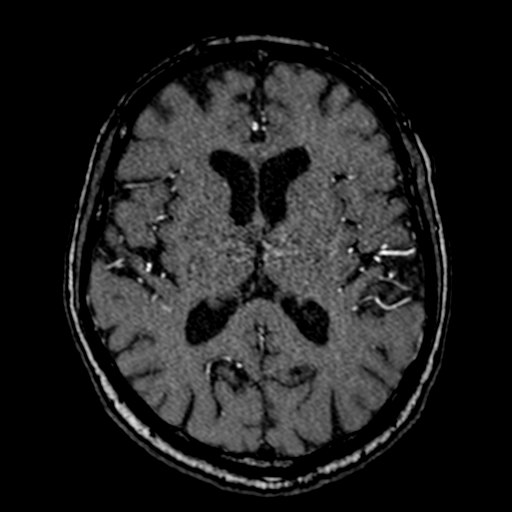

[20 of 48 positions shown; findings below may reference images not displayed]

FINDINGS: Chronic bilateral ICA occlusion. Right ICA is reconstituted from a
large posterior communicating artery on the right. Left ICA is
reconstituted at the cavernous segment due to multiple tangled
collaterals from the middle meningeal artery. Fainter signal in the
right MCA and bilateral proximal ACA branches due to circuitous
course and vessel direction.

Dominant left vertebral artery. Patulous vertebral and basilar
arteries with basilar tip and right P1 segment fusiform aneurysm
measuring up to 10 mm in diameter and spanning an approximately 2 cm
length. No branch occlusion, beading, or progressive finding.
IMPRESSION: 1. Unchanged fusiform aneurysm right P1 segment and basilar tip.
2. Stable appearance of bilateral ICA occlusion in the neck with
intracranial reconstitution.

## 2021-05-10 MED ORDER — AMLODIPINE BESYLATE 2.5 MG PO TABS: 2.5000 mg | ORAL_TABLET | Freq: Every day | ORAL | 2 refills | Status: DC

## 2021-05-10 NOTE — Telephone Encounter (Signed)
done

## 2021-05-18 DIAGNOSIS — C61 Malignant neoplasm of prostate: Secondary | ICD-10-CM | POA: Diagnosis not present

## 2021-05-20 DIAGNOSIS — R8271 Bacteriuria: Secondary | ICD-10-CM | POA: Diagnosis not present

## 2021-05-20 DIAGNOSIS — C61 Malignant neoplasm of prostate: Secondary | ICD-10-CM | POA: Diagnosis not present

## 2021-05-31 DIAGNOSIS — J309 Allergic rhinitis, unspecified: Secondary | ICD-10-CM | POA: Diagnosis not present

## 2021-05-31 DIAGNOSIS — G629 Polyneuropathy, unspecified: Secondary | ICD-10-CM | POA: Diagnosis not present

## 2021-05-31 DIAGNOSIS — I70209 Unspecified atherosclerosis of native arteries of extremities, unspecified extremity: Secondary | ICD-10-CM | POA: Diagnosis not present

## 2021-05-31 DIAGNOSIS — N529 Male erectile dysfunction, unspecified: Secondary | ICD-10-CM | POA: Diagnosis not present

## 2021-05-31 DIAGNOSIS — M1990 Unspecified osteoarthritis, unspecified site: Secondary | ICD-10-CM | POA: Diagnosis not present

## 2021-05-31 DIAGNOSIS — H538 Other visual disturbances: Secondary | ICD-10-CM | POA: Diagnosis not present

## 2021-05-31 DIAGNOSIS — Z7983 Long term (current) use of bisphosphonates: Secondary | ICD-10-CM | POA: Diagnosis not present

## 2021-05-31 DIAGNOSIS — Z7902 Long term (current) use of antithrombotics/antiplatelets: Secondary | ICD-10-CM | POA: Diagnosis not present

## 2021-05-31 DIAGNOSIS — H409 Unspecified glaucoma: Secondary | ICD-10-CM | POA: Diagnosis not present

## 2021-05-31 DIAGNOSIS — I10 Essential (primary) hypertension: Secondary | ICD-10-CM | POA: Diagnosis not present

## 2021-05-31 DIAGNOSIS — E785 Hyperlipidemia, unspecified: Secondary | ICD-10-CM | POA: Diagnosis not present

## 2021-05-31 DIAGNOSIS — N393 Stress incontinence (female) (male): Secondary | ICD-10-CM | POA: Diagnosis not present

## 2021-07-04 ENCOUNTER — Telehealth: Payer: Self-pay | Admitting: Cardiovascular Disease

## 2021-07-04 NOTE — Telephone Encounter (Signed)
Wife called to say that patient has changed practices.

## 2021-08-07 ENCOUNTER — Other Ambulatory Visit: Payer: Self-pay | Admitting: Cardiovascular Disease

## 2021-08-09 DIAGNOSIS — E7800 Pure hypercholesterolemia, unspecified: Secondary | ICD-10-CM | POA: Diagnosis not present

## 2021-08-15 ENCOUNTER — Ambulatory Visit (INDEPENDENT_AMBULATORY_CARE_PROVIDER_SITE_OTHER): Payer: Medicare HMO | Admitting: Ophthalmology

## 2021-08-15 DIAGNOSIS — H353134 Nonexudative age-related macular degeneration, bilateral, advanced atrophic with subfoveal involvement: Secondary | ICD-10-CM | POA: Diagnosis not present

## 2021-08-15 DIAGNOSIS — H401133 Primary open-angle glaucoma, bilateral, severe stage: Secondary | ICD-10-CM

## 2021-08-15 DIAGNOSIS — H43812 Vitreous degeneration, left eye: Secondary | ICD-10-CM | POA: Diagnosis not present

## 2021-08-15 NOTE — Assessment & Plan Note (Signed)
Under the care of Dr. Delman Cheadle and Dr. Katy Apo

## 2021-08-15 NOTE — Progress Notes (Signed)
08/15/2021     CHIEF COMPLAINT Patient presents for  Chief Complaint  Patient presents with   Retina Evaluation   Macular Degeneration      HISTORY OF PRESENT ILLNESS: Lance Dillon is a 81 y.o. male who presents to the clinic today for:   HPI     Retina Evaluation           Laterality: both eyes   Associated Symptoms: Blind Spot         Comments   30yrFU, OU, Fundus , with history of dry atrophic ARMD Pt states his vision has been stable Pt denies any new floaters or FOL      Last edited by RHurman Horn MD on 08/15/2021  8:38 AM.      Referring physician: PDeland Pretty MD [id number]  [place] [id number] HISTORICAL INFORMATION:   Selected notes from the [facility] Current Outpatient Medications (Ophthalmic Drugs)  Medication Sig   latanoprost (XALATAN) 0.005 % ophthalmic solution Place 1 drop into the left eye at bedtime.    No current facility-administered medications for this visit. (Ophthalmic Drugs)   Current Outpatient Medications (Other)  Medication Sig   acetaminophen (TYLENOL) 500 MG tablet Take 500 mg by mouth every 6 (six) hours as needed for mild pain or headache.   amLODipine (NORVASC) 2.5 MG tablet Take 1 tablet (2.5 mg total) by mouth at bedtime.   budesonide-formoterol (SYMBICORT) 80-4.5 MCG/ACT inhaler Inhale 2 puffs into the lungs 2 (two) times daily as needed.   Calcium Carbonate-Vitamin D (CALTRATE 600+D PO) Take 1 tablet by mouth daily.    clopidogrel (PLAVIX) 75 MG tablet Take 1 tablet (75 mg total) by mouth daily.   docusate sodium (COLACE) 100 MG capsule Take 1 capsule (100 mg total) by mouth daily as needed for mild constipation.   ferrous sulfate 325 (65 FE) MG tablet Take 1 tablet (325 mg total) by mouth daily with breakfast.   fluticasone (FLONASE) 50 MCG/ACT nasal spray Place 2 sprays into both nostrils daily as needed for rhinitis or allergies (or nasal  congestion).   losartan-hydrochlorothiazide (HYZAAR) 100-12.5 MG tablet Take 1 tablet by mouth daily.   LUTEIN PO Take 1 capsule by mouth daily.   metoprolol succinate (TOPROL-XL) 25 MG 24 hr tablet TAKE 1 TABLET (25 MG TOTAL) BY MOUTH DAILY. ** DO NOT CRUSH ** (BETA BLOCKER)   Multiple Vitamin (MULTIVITAMIN WITH MINERALS) TABS tablet Take 1 tablet by mouth daily.   Omega-3 Fatty Acids (FISH OIL) 1200 MG CAPS Take 3,600 mg by mouth daily.   rosuvastatin (CRESTOR) 10 MG tablet TAKE 1 TABLET BY MOUTH EVERY DAY (Patient taking differently: Take 10 mg by mouth at bedtime.)   Turmeric (QC TUMERIC COMPLEX PO) Take 1,000 Units by mouth daily.   vitamin B-12 (CYANOCOBALAMIN) 500 MCG tablet Take 1 tablet (500 mcg total) by mouth daily.   No current facility-administered medications for this visit. (Other)      REVIEW OF SYSTEMS: ROS   Negative for: Constitutional, Gastrointestinal, Neurological, Skin, Genitourinary, Musculoskeletal, HENT, Endocrine, Cardiovascular, Eyes, Respiratory, Psychiatric, Allergic/Imm, Heme/Lymph Last edited by PMorene Rankins CMA on 08/15/2021  8:10 AM.       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   Bilateral: Noted on MRA of brain in November 2015 with collateral flow from posterior circulation and external collaterals reconstituting anterior circulation   Chronic kidney disease    Colon polyps 2004, 07, 14   COPD (chronic obstructive pulmonary disease) (HCC)    GERD  (gastroesophageal reflux disease)    diet controlled, no meds   Glaucoma    left eye    Hx of radiation therapy 03/22/11 to 05/08/11   PSA recurrent carcinoma of prostate   Hypercholesterolemia    Hypertension    Macular degeneration of both eyes    Peripheral vascular disease ([place]) 07/2011   Bilateral SFA stenosis: Status post right femoropopliteal bypass - Dr. 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 mm x 8 mm fusiform aneurysm    Prostate cancer ([place]) 2004   Renal artery stenosis (HCC)    70% left   Stones in the urinary tract    Stroke [facility])    Urinary incontinence    Past Surgical History:  Procedure Laterality Date   ABDOMINAL AORTAGRAM N/A 06/20/2011   Procedure: ABDOMINAL Maxcine Ham;  Surgeon: Serafina Mitchell, MD;  Location: [facility] CATH LAB;  Service: Cardiovascular;  Laterality: N/A;   ABDOMINAL AORTOGRAM W/LOWER EXTREMITY N/A 12/20/2020   Procedure: ABDOMINAL AORTOGRAM W/LOWER EXTREMITY;  Surgeon: Waynetta Sandy, MD;  Location: [facility] CV LAB;  Service: Cardiovascular;  Laterality: N/A;   AMPUTATION Right 12/28/2020   Procedure: RIGHT 4TH TOE AMPUTATION;  Surgeon: Waynetta Sandy, MD;  Location: [place];  Service: Vascular;  Laterality: Right;   CARDIOVASCULAR STRESS TEST  07/06/2011   Evidence of mild ischemia in Basal Inferior and Mid Inferior regions, no significant wall motion abnormalities noted.   clamp to penis     for urinary control   COLONOSCOPY     COLONOSCOPY W/ BIOPSIES     multiple    CYSTOSCOPY W/ URETERAL STENT PLACEMENT Left 06/07/2015   Procedure: CYSTOSCOPY, RETROGRADE PYLEGRAM  WITH STENT PLACEMENT;  Surgeon: Bjorn Loser, MD;  Location: [facility] ORS;  Service: Urology;  Laterality: Left;   CYSTOSCOPY WITH RETROGRADE PYELOGRAM, URETEROSCOPY AND STENT PLACEMENT Left 07/28/2015   Procedure: CYSTOSCOPY, URETEROSCOPY, WITH BASKET STONE REMOVAL;  Surgeon: Raynelle Bring, MD;  Location: [facility] ORS;  Service: Urology;   Laterality: Left;   ENDARTERECTOMY FEMORAL Right 12/21/2020   Procedure: RIGHT FEMORAL ENDARTERECTOMY;  Surgeon: Waynetta Sandy, MD;  Location: [facility];  Service: Vascular;  Laterality: Right;   EYE SURGERY Right 08/2014   Cataract; removed bilat   FEMORAL-POPLITEAL BYPASS GRAFT  07/19/2011   Procedure: BYPASS GRAFT FEMORAL-POPLITEAL ARTERY;  Surgeon: Mal Misty, MD;  [facility] OR;  Service: Vascular;  Laterality: Right;  Right Femoral - Popliteal  Bypss with saphenous vein   FEMORAL-POPLITEAL BYPASS GRAFT Right 12/21/2020   Procedure: RIGHT FEMORAL-POPLITEAL ARTERY BYPASS GRAFT WITH PTFE;  Surgeon: Waynetta Sandy, MD;  Location: [facility];  Service: Vascular;  Laterality: Right;   HERNIA REPAIR     right side,lft   INTRAOPERATIVE ARTERIOGRAM  07/19/2011   Procedure: INTRA OPERATIVE ARTERIOGRAM;  Surgeon: Mal Misty, MD;  Location: [facility];  Service: Vascular;  Laterality: Right;   NM MYOVIEW LTD  07/2011   Normal EF, very small area of basal-mid inferior ischemia; LOW RISK   PENILE PROSTHESIS  REMOVAL     PENILE PROSTHESIS IMPLANT     PR VEIN BYPASS GRAFT,AORTO-FEM-POP  07/19/2011   Right  Fem/Pop BPG   PROSTATE BIOPSY     prostatectomy retropubic radical  07/15/2002   with nerve sparing, Gleason 3+4=7   Reclast  07/25/2013   RENAL DUPLEX  07/01/2012   Left renal artery 60-99% diameter reduction   TRANSTHORACIC ECHOCARDIOGRAM  01/2017   Normal EF and overall function    FAMILY HISTORY Family History  Problem Relation Age of Onset   Prostate cancer Brother 88   Heart disease Brother        Before age 42   Hypertension Brother    Heart attack Brother    Aneurysm Father    Heart disease Father    Hypertension Father    Heart disease Mother        Before age 53   Hypertension Mother    Heart attack Mother    Heart disease Sister    Hypertension Sister    Heart attack Sister    Colon cancer Neg Hx    Esophageal cancer Neg Hx    Stomach cancer Neg  Hx    Rectal cancer Neg Hx    Colon polyps Neg Hx     SOCIAL HISTORY Social History   Tobacco Use   Smoking status: Former    Packs/day: 3.00    Years: 30.00    Total pack years: 90.00    Types: Cigarettes    Quit date: 07/01/2002    Years since quitting: 19.1   Smokeless tobacco: Never  Vaping Use   Vaping Use: Never used  Substance Use Topics   Alcohol use: No   Drug use: No         OPHTHALMIC EXAM:  Base Eye Exam     Visual Acuity (ETDRS)       Right Left   Dist [place] HM 20/80         Tonometry (Tonopen, 8:15 AM)       Right Left   Pressure 19 10         Pupils       Pupils APD   Right PERRL None   Left PERRL          Visual Fields       Left Right   Restrictions Partial outer superior temporal, inferior temporal, superior nasal, inferior nasal deficiencies Partial inner superior temporal, inferior temporal, superior nasal, inferior nasal deficiencies         Extraocular Movement       Right Left    Full, Ortho Full, Ortho         Neuro/Psych     Oriented x3: Yes   Mood/Affect: Normal         Dilation     Both eyes: 1.0% Mydriacyl, 2.5% Phenylephrine @ 8:11 AM           Slit Lamp and Fundus Exam     External Exam       Right Left   External Normal Normal         Slit Lamp Exam       Right Left   Lids/Lashes Normal Normal   Conjunctiva/Sclera White and quiet White and quiet   Cornea Clear Clear   Anterior Chamber Deep and  quiet Deep and quiet   Iris Round and reactive Round and reactive   Lens Centered posterior chamber intraocular lens Centered posterior chamber intraocular lens   Anterior Vitreous Normal Normal         Fundus Exam       Right Left   Posterior Vitreous Posterior vitreous detachment Posterior vitreous detachment   Disc Normal Normal   C/D Ratio 0.55 0.9   Macula Geographic atrophy, Pigmented atrophy, Disciform scar subfoveal location Geographic atrophy, Pigmented atrophy in the FAZ  region., Disciform scar, 8 disc area size atrophy, splits fovea   Vessels Normal Normal   Periphery Normal Normal            IMAGING AND PROCEDURES  Imaging and Procedures for 08/15/21  OCT, Retina - OU - Both Eyes       Right Eye Quality was good. Scan locations included subfoveal. Central Foveal Thickness: 275. Progression has been stable. Findings include no IRF, no SRF, abnormal foveal contour, central retinal atrophy, inner retinal atrophy, outer retinal atrophy.   Left Eye Quality was good. Scan locations included subfoveal. Central Foveal Thickness: 235. Progression has been stable. Findings include no IRF, no SRF, abnormal foveal contour, central retinal atrophy, inner retinal atrophy, outer retinal atrophy.   Notes Bilateral atrophic change in the macula OU.  No signs of active CNVM today     Color Fundus Photography Optos - OU - Both Eyes       Right Eye Progression has no prior data. Disc findings include normal observations. Macula : geographic atrophy. Vessels : normal observations. Periphery : normal observations.   Left Eye Progression has no prior data. Macula : geographic atrophy. Vessels : normal observations. Periphery : normal observations.              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  Will discuss next visit Syfovre becomes available  Posterior vitreous detachment of left eye Stable OS.  Primary open angle glaucoma of both eyes, severe stage Under the care of Dr. Delman Cheadle and Dr. Katy Apo     ICD-10-CM   1. Posterior vitreous detachment of left eye  H43.812 OCT, Retina - OU - Both Eyes    Color Fundus Photography Optos - OU - Both Eyes    2. Advanced nonexudative age-related macular degeneration of both eyes with subfoveal  involvement  H35.3134     3. Primary open angle glaucoma of both eyes, severe stage  H40.1133       1.  Bilateral dry atrophic AMD.  OS with however with stable vision over the last 2 years.  We will continue to monitor  2.  RV in 4 months patient will be eligible for Syfovre likely at that time  3.  Ophthalmic Meds Ordered this visit:  No orders of the defined types were placed in this encounter.      Return in about 4 months (around 12/16/2021) for DILATE OU, OCT,, possible Syfovre injection, apply for good days.  There are no Patient Instructions on file for this visit.   Explained the diagnoses, plan, and follow up with the patient and they expressed understanding.  Patient expressed understanding of the importance of proper follow up care.   Clent Demark Harley Fitzwater M.D. Diseases & Surgery of the Retina and Vitreous Retina & Diabetic [place] 08/15/21  Abbreviations: M myopia (nearsighted); A astigmatism; H hyperopia (farsighted); P presbyopia; Mrx spectacle prescription;  CTL contact lenses; OD right eye; OS left eye; OU both eyes  XT exotropia; ET esotropia; PEK punctate epithelial keratitis; PEE punctate epithelial erosions; DES dry eye syndrome; MGD meibomian gland dysfunction; ATs artificial tears; PFAT's preservative free artificial tears; [place] nuclear sclerotic cataract; PSC posterior subcapsular cataract; ERM epi-retinal membrane; PVD posterior vitreous detachment; RD retinal detachment; DM diabetes mellitus; DR diabetic retinopathy; NPDR non-proliferative diabetic retinopathy; PDR proliferative diabetic retinopathy; CSME clinically significant macular edema; DME diabetic macular edema; dbh dot blot hemorrhages; CWS cotton wool spot; POAG primary open angle glaucoma; C/D cup-to-disc ratio; HVF humphrey visual field; GVF goldmann visual field; OCT optical coherence tomography; IOP intraocular pressure; BRVO Branch retinal vein occlusion; CRVO central retinal vein occlusion;  CRAO central retinal artery occlusion; BRAO branch retinal artery occlusion; RT retinal tear; SB scleral buckle; PPV pars plana vitrectomy; VH Vitreous hemorrhage; PRP panretinal laser photocoagulation; IVK intravitreal kenalog; VMT vitreomacular traction; MH Macular hole;  NVD neovascularization of the disc; NVE neovascularization elsewhere; AREDS age related eye disease study; ARMD age related macular degeneration; POAG primary open angle glaucoma; EBMD epithelial/anterior basement membrane dystrophy; ACIOL anterior chamber intraocular lens; IOL intraocular lens; PCIOL posterior chamber intraocular lens; Phaco/IOL phacoemulsification with intraocular lens placement; [place] photorefractive keratectomy; LASIK laser assisted in situ keratomileusis; HTN hypertension; DM diabetes mellitus; COPD chronic obstructive pulmonary disease

## 2021-08-15 NOTE — Assessment & Plan Note (Signed)
OD with extensive vision loss, not a good candidate for treatment of dry AMD, OS excellent candidate for treatment of dry AMD.  Will discuss next visit Syfovre becomes available

## 2021-08-15 NOTE — Assessment & Plan Note (Signed)
Stable OS 

## 2021-08-17 DIAGNOSIS — N1831 Chronic kidney disease, stage 3a: Secondary | ICD-10-CM | POA: Diagnosis not present

## 2021-08-17 DIAGNOSIS — I6523 Occlusion and stenosis of bilateral carotid arteries: Secondary | ICD-10-CM | POA: Diagnosis not present

## 2021-08-17 DIAGNOSIS — Z0000 Encounter for general adult medical examination without abnormal findings: Secondary | ICD-10-CM | POA: Diagnosis not present

## 2021-08-17 DIAGNOSIS — I739 Peripheral vascular disease, unspecified: Secondary | ICD-10-CM | POA: Diagnosis not present

## 2021-08-17 DIAGNOSIS — I671 Cerebral aneurysm, nonruptured: Secondary | ICD-10-CM | POA: Diagnosis not present

## 2021-08-17 DIAGNOSIS — C61 Malignant neoplasm of prostate: Secondary | ICD-10-CM | POA: Diagnosis not present

## 2021-08-17 DIAGNOSIS — G8929 Other chronic pain: Secondary | ICD-10-CM | POA: Diagnosis not present

## 2021-08-17 DIAGNOSIS — M810 Age-related osteoporosis without current pathological fracture: Secondary | ICD-10-CM | POA: Diagnosis not present

## 2021-08-17 DIAGNOSIS — M545 Low back pain, unspecified: Secondary | ICD-10-CM | POA: Diagnosis not present

## 2021-08-17 DIAGNOSIS — E559 Vitamin D deficiency, unspecified: Secondary | ICD-10-CM | POA: Diagnosis not present

## 2021-08-17 DIAGNOSIS — I10 Essential (primary) hypertension: Secondary | ICD-10-CM | POA: Diagnosis not present

## 2021-08-17 DIAGNOSIS — J439 Emphysema, unspecified: Secondary | ICD-10-CM | POA: Diagnosis not present

## 2021-08-22 ENCOUNTER — Encounter: Payer: Self-pay | Admitting: Internal Medicine

## 2021-08-22 ENCOUNTER — Ambulatory Visit: Payer: Medicare HMO | Admitting: Internal Medicine

## 2021-08-22 DIAGNOSIS — J449 Chronic obstructive pulmonary disease, unspecified: Secondary | ICD-10-CM | POA: Diagnosis not present

## 2021-08-22 DIAGNOSIS — J8410 Pulmonary fibrosis, unspecified: Secondary | ICD-10-CM | POA: Diagnosis not present

## 2021-08-22 NOTE — Patient Instructions (Signed)
To get the most out of exercise, you need to be continuously aware that you are short of breath, but never out of breath, for at least 30 minutes daily. As you improve, it will actually be easier for you to do the same amount of exercise  in  30 minutes so always push to the level where you are short of breath.      Make sure you check your oxygen saturations at highest level of activity    My office will be contacting you by phone for referral for CT chest      Please schedule a follow up visit in 6 months but call sooner if needed

## 2021-08-22 NOTE — Progress Notes (Signed)
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 Renne Crigler for eval of abnormal lung sounds. Pt denies any respiratory co's.  pt has worked around boilers since from in 1980's but min asbestos exposure  Presently Not limited by breathing from desired activities   No cough /wheeze or noct symptoms  rec F/u prn    02/12/2018  f/u ov/Rilynn Habel re: new cough x 09/2017 indolent onset persistent night and day  Chief Complaint  Patient presents with   Acute Visit    Pt c/o cough since September 2019. He occ prod some clear sputum.   Dyspnea:  Not limited by breathing from desired activities  Including yardwork Cough: at hs  But not all night and then after stirs, better on tessalon Sleeping: better R side down/ bed flat  SABA use: none on dulera 200 2bid but hfa poor and ? dulera makes cough worse?  rec Dulera 100 Take 2 puffs first thing in am and then another 2 puffs about 12 hours later.  Work on inhaler technique:   Resume flonase one twice daily point toward ear on same side  Only take the tessalon 100 mg if you can't stop coughing     03/12/2018  f/u ov/Kristian Hazzard re: cough x sept 2019  Chief Complaint  Patient presents with   Follow-up    Breathing is "great" and he has not had to use his rescue inhaler.    Dyspnea:  Not limited by breathing from desired activities   Cough: resolved / no longer need tessalon Sleeping: flat on either side  SABA use: none  Rec I emphasized that nasal steroids have no immediate benefit in terms of improving symptoms.      Symbicort 80 up to 2 pffs every 12 hours if needed if not doing great   Pulmonary follow up is as needed      08/22/2021  f/u ov/Pinkey Mcjunkin re: ? Copd /Asbestosis   on no maint rx  Chief Complaint  Patient presents with   Follow-up    Pt states he has had a vein replace in right and toe amputation but breathing is good.  Dyspnea:  does stationery bike x 30 min  / limited by hips and back - hills are more difficult that used to be, but still able to get from MB back up to house s stopping  Cough: none  Sleeping: fine flat/ on pillow  SABA use: none  02: none  Covid status:  vax all    No obvious day to day or daytime variability or assoc excess/ purulent sputum or mucus plugs or hemoptysis or cp or chest tightness, subjective wheeze or overt sinus or hb symptoms.   Sleeping  without nocturnal  or early am exacerbation  of respiratory  c/o's or need for noct saba. Also denies any obvious fluctuation of symptoms with weather or environmental changes or other aggravating or alleviating factors except as outlined above   No unusual exposure  hx or h/o childhood pna/ asthma or knowledge of premature birth.  Current Allergies, Complete Past Medical History, Past Surgical History, Family History, and Social History were reviewed in [employer] record.  ROS  The following are not active complaints unless bolded Hoarseness, sore throat, dysphagia, dental problems, itching, sneezing,  nasal congestion or discharge of excess mucus or purulent secretions, ear ache,   fever, chills, sweats, unintended wt loss or wt gain, classically pleuritic or exertional cp,  orthopnea pnd or arm/hand swelling  or leg swelling, presyncope, palpitations, abdominal pain, anorexia, nausea, vomiting, diarrhea  or change in bowel habits or change in bladder habits, change in stools or change in urine, dysuria, hematuria,  rash, arthralgias, visual complaints, headache, numbness, weakness or ataxia or  problems with walking or coordination,  change in mood or  memory.        Current Meds  Medication Sig   acetaminophen (TYLENOL) 500 MG tablet Take 500 mg by mouth every 6 (six) hours as needed for mild pain or headache.   Calcium Carbonate-Vitamin D (CALTRATE 600+D PO) Take 1 tablet by mouth daily.    clopidogrel (PLAVIX) 75 MG tablet Take 1 tablet (75 mg total) by mouth daily.   docusate sodium (COLACE) 100 MG capsule Take 1 capsule (100 mg total) by mouth daily as needed for mild constipation.   fluticasone (FLONASE) 50 MCG/ACT nasal spray Place 2 sprays into both nostrils daily as needed for rhinitis or allergies (or nasal congestion).   latanoprost (XALATAN) 0.005 % ophthalmic solution Place 1 drop into the left eye at bedtime.    losartan-hydrochlorothiazide (HYZAAR) 100-12.5 MG tablet Take 1 tablet by mouth daily.   LUTEIN PO Take 1 capsule by mouth daily.   metoprolol succinate (TOPROL-XL) 25 MG 24 hr tablet TAKE 1 TABLET (25 MG TOTAL) BY MOUTH DAILY. ** DO NOT CRUSH ** (BETA BLOCKER)   Multiple Vitamin (MULTIVITAMIN WITH MINERALS) TABS tablet Take 1 tablet by mouth daily.   rosuvastatin (CRESTOR) 10 MG tablet TAKE 1 TABLET BY MOUTH EVERY DAY (Patient taking differently: Take 10 mg by mouth at bedtime.)   Turmeric (QC TUMERIC COMPLEX PO) Take 1,000 Units by mouth daily.   vitamin B-12 (CYANOCOBALAMIN) 500 MCG tablet Take 1 tablet (500 mcg total) by mouth daily.                     Objective:   Physical Exam     08/22/2021      165  03/12/2018       168  02/12/2018       167  07/19/17 167 lb (75.8 kg)  12/01/16 167 lb 6.4 oz (75.9 kg)  05/17/16 164 lb (74.4 kg)       Vital signs reviewed  08/22/2021  - Note at rest 02 sats  95% on RA   General appearance:    pleasant amb wm and   HEENT : Oropharynx  clear  Nasal turbinates nl    NECK :  without  apparent JVD/ palpable Nodes/TM    LUNGS: no acc muscle use,  Min barrel  contour chest wall with bilateral  basilar insp  crackles and  without cough on insp or exp maneuvers and min  Hyperresonant  to  percussion bilaterally    CV:  RRR  no s3 or murmur or increase in P2, and no edema   ABD:  soft and nontender with pos end  insp Hoover's  in the supine position.  No  bruits or organomegaly appreciated   MS:  Nl gait/ ext warm without deformities Or obvious joint restrictions  calf tenderness, cyanosis   - NO  clubbing     SKIN: warm and dry without lesions    NEURO:  alert, approp, nl sensorium with  no motor or cerebellar deficits apparent.              Assessment:

## 2021-08-23 ENCOUNTER — Encounter: Payer: Self-pay | Admitting: Internal Medicine

## 2021-08-23 NOTE — Assessment & Plan Note (Addendum)
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   Discussed in detail all the  indications, usual  risks and alternatives  relative to the benefits with patient who agrees to proceed with w/u as outlined.     Each maintenance medication was reviewed in detail including emphasizing most importantly the difference between maintenance and prns and under what circumstances the prns are to be triggered using an action plan format where appropriate.  Total time for H and P, chart review, counseling, reviewing various inhaler  device(s) , directly observing portions of ambulatory 02 saturation study/ and generating customized AVS unique to this office visit / same day charting = 35 min with pt not seen by me in over 3 y

## 2021-08-23 NOTE — Assessment & Plan Note (Addendum)
Quit smokng 2004 - Spirometry 07/19/2017  FEV1 1.68 (61%)  Ratio 65 with classic curvature   - 02/12/2018    changed from dulera 200 to 100 2bid as cough is the main symptom - 03/12/2018  After extensive coaching inhaler device,  effectiveness =    90% > continue on symbicort 80 as insurance prefers but ok to taper if doing great >  pt d/c'd and no worse off  Presently Pt is Group B in terms of symptom/risk and laba/lama therefore appropriate rx at this point >>>  stiolto or bevespi or anoro approp but he is not convinced he improved on laba previously or needs additional ventilatory capacity now so left open adding the lama/laba until he feels really limited in desired activities

## 2021-08-23 NOTE — Progress Notes (Unsigned)
    Subjective:    CC: Low back pain  I, Molly Weber, LAT, ATC, am serving as scribe for Dr. [name].  HPI: Pt is an 81 y/o male presenting w/ c/o chronic LBP w/ pain radiating into his B thighs due to spinal stenosis per prior MRI (L3-S1).  He locates his pain to .  He has been seen previously at [facility] and diagnosed w/ lumbar radiculopathy.  He was advised to use a walker instead of a cane and provided a back brace.  Radiating pain: yes LE paresthesias: LE weakness: Aggravating factors: standing; walking;  Treatments tried: AD (cane, walker); prednisone dose pack; previous ESI and facet injections;   Diagnostic imaging: prior XR and MRI through [facility];   Pertinent review of Systems: ***  Relevant historical information: ***   Objective:   There were no vitals filed for this visit. General: Well Developed, well nourished, and in no acute distress.   MSK: ***  Lab and Radiology Results No results found for this or any previous visit (from the past 72 hour(s)). No results found.    Impression and Recommendations:    Assessment and Plan: 81 y.o. male with ***.  PDMP not reviewed this encounter. No orders of the defined types were placed in this encounter.  No orders of the defined types were placed in this encounter.   Discussed warning signs or symptoms. Please see discharge instructions. Patient expresses understanding.   ***

## 2021-08-24 ENCOUNTER — Ambulatory Visit (INDEPENDENT_AMBULATORY_CARE_PROVIDER_SITE_OTHER): Payer: Medicare HMO | Admitting: Family Medicine

## 2021-08-24 ENCOUNTER — Ambulatory Visit (INDEPENDENT_AMBULATORY_CARE_PROVIDER_SITE_OTHER): Payer: Medicare HMO

## 2021-08-24 VITALS — BP 116/74 | HR 72 | Ht 67.0 in | Wt 166.4 lb

## 2021-08-24 DIAGNOSIS — M545 Low back pain, unspecified: Secondary | ICD-10-CM | POA: Diagnosis not present

## 2021-08-24 DIAGNOSIS — M7062 Trochanteric bursitis, left hip: Secondary | ICD-10-CM | POA: Diagnosis not present

## 2021-08-24 DIAGNOSIS — M5416 Radiculopathy, lumbar region: Secondary | ICD-10-CM

## 2021-08-24 DIAGNOSIS — M7061 Trochanteric bursitis, right hip: Secondary | ICD-10-CM | POA: Diagnosis not present

## 2021-08-24 DIAGNOSIS — M4126 Other idiopathic scoliosis, lumbar region: Secondary | ICD-10-CM | POA: Diagnosis not present

## 2021-08-24 DIAGNOSIS — G8929 Other chronic pain: Secondary | ICD-10-CM

## 2021-08-24 DIAGNOSIS — S3993XA Unspecified injury of pelvis, initial encounter: Secondary | ICD-10-CM | POA: Diagnosis not present

## 2021-08-24 DIAGNOSIS — R102 Pelvic and perineal pain: Secondary | ICD-10-CM | POA: Diagnosis not present

## 2021-08-24 NOTE — Patient Instructions (Addendum)
Thank you for coming in today.   Please get an Xray today before you leave   You received an injection today. Seek immediate medical attention if the joint becomes red, extremely painful, or is oozing fluid.   I've referred you to Physical Therapy.  Let us know if you don't hear from them in one week.   Check back in 1 month

## 2021-08-25 NOTE — Progress Notes (Signed)
Pelvis x-ray does show some hip arthritis bilaterally

## 2021-08-25 NOTE — Progress Notes (Signed)
Lumbar spine x-ray shows severe arthritis changes with some scoliosis changes present.

## 2021-08-27 ENCOUNTER — Ambulatory Visit (HOSPITAL_BASED_OUTPATIENT_CLINIC_OR_DEPARTMENT_OTHER): Payer: Medicare HMO

## 2021-08-27 ENCOUNTER — Ambulatory Visit (HOSPITAL_BASED_OUTPATIENT_CLINIC_OR_DEPARTMENT_OTHER)
Admission: RE | Admit: 2021-08-27 | Discharge: 2021-08-27 | Disposition: A | Discharge status: Home / Self Care | Payer: Medicare HMO | Source: Ambulatory Visit | Attending: Internal Medicine | Admitting: Internal Medicine

## 2021-08-27 DIAGNOSIS — J439 Emphysema, unspecified: Secondary | ICD-10-CM | POA: Diagnosis not present

## 2021-08-27 DIAGNOSIS — J8410 Pulmonary fibrosis, unspecified: Secondary | ICD-10-CM | POA: Insufficient documentation

## 2021-08-27 DIAGNOSIS — J984 Other disorders of lung: Secondary | ICD-10-CM | POA: Diagnosis not present

## 2021-08-29 ENCOUNTER — Telehealth: Payer: Self-pay | Admitting: Family Medicine

## 2021-08-29 ENCOUNTER — Ambulatory Visit (INDEPENDENT_AMBULATORY_CARE_PROVIDER_SITE_OTHER): Payer: Medicare HMO | Admitting: Physical Therapy

## 2021-08-29 ENCOUNTER — Encounter: Payer: Self-pay | Admitting: Physical Therapy

## 2021-08-29 DIAGNOSIS — M6281 Muscle weakness (generalized): Secondary | ICD-10-CM | POA: Diagnosis not present

## 2021-08-29 DIAGNOSIS — M5459 Other low back pain: Secondary | ICD-10-CM | POA: Diagnosis not present

## 2021-08-29 NOTE — Telephone Encounter (Signed)
Received medical records from orthopedics.  He has had a trial of an [employer] back brace most recently as of August 2022.  He had bilateral L4 selective nerve root block that did not help much.  This was done on July 22, 2020. (Epidural steroid injection type)  Documents sent to scan.

## 2021-08-29 NOTE — Therapy (Signed)
OUTPATIENT PHYSICAL THERAPY THORACOLUMBAR EVALUATION   Patient Name: Lance Dillon MRN: [id number] DOB:1940/12/11, 81 y.o., male Today's Date: 08/29/2021   PT End of Session - 08/29/21 0759     Visit Number 1    Number of Visits 16    Date for PT Re-Evaluation 10/24/21    Authorization Type [employer]    Progress Note Due on Visit 10    PT Start Time 0805    PT Stop Time 0841    PT Time Calculation (min) 36 min    Activity Tolerance Patient tolerated treatment well    Behavior During Therapy [facility] for tasks assessed/performed             Past Medical History:  Diagnosis Date   Anemia    Arthritis    Atherosclerosis of aortic bifurcation and common iliac arteries ([place]) 03/2015   Noted on abdominal/renal Dopplers   Blind right eye    Carotid artery, internal, 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   Bilateral: Noted on MRA of brain in November 2015 with collateral flow from posterior circulation and external collaterals reconstituting anterior circulation   Chronic kidney disease    Colon polyps 2004, 07, 14   COPD (chronic obstructive pulmonary disease) (HCC)    GERD (gastroesophageal reflux disease)    diet controlled, no meds   Glaucoma    left eye    Hx of radiation therapy 03/22/11 to 05/08/11   PSA recurrent carcinoma of prostate   Hypercholesterolemia    Hypertension    Macular degeneration of both eyes    Peripheral vascular disease ([place]) 07/2011   Bilateral SFA stenosis: Status post right femoropopliteal bypass - Dr. 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 mm x 8 mm fusiform aneurysm    Prostate cancer ([place]) 2004   Renal artery stenosis (HCC)    70% left   Stones in the urinary tract    Stroke [facility])    Urinary incontinence    Past Surgical History:  Procedure Laterality Date   ABDOMINAL AORTAGRAM N/A 06/20/2011   Procedure: ABDOMINAL Maxcine Ham;  Surgeon: Serafina Mitchell, MD;  Location: [facility] CATH LAB;  Service: Cardiovascular;  Laterality: N/A;    ABDOMINAL AORTOGRAM W/LOWER EXTREMITY N/A 12/20/2020   Procedure: ABDOMINAL AORTOGRAM W/LOWER EXTREMITY;  Surgeon: Waynetta Sandy, MD;  Location: [place] CV LAB;  Service: Cardiovascular;  Laterality: N/A;   AMPUTATION Right 12/28/2020   Procedure: RIGHT 4TH TOE AMPUTATION;  Surgeon: Waynetta Sandy, MD;  Location: [place];  Service: Vascular;  Laterality: Right;   CARDIOVASCULAR STRESS TEST  07/06/2011   Evidence of mild ischemia in Basal Inferior and Mid Inferior regions, no significant wall motion abnormalities noted.   clamp to penis     for urinary control   COLONOSCOPY     COLONOSCOPY W/ BIOPSIES     multiple    CYSTOSCOPY W/ URETERAL STENT PLACEMENT Left 06/07/2015   Procedure: CYSTOSCOPY, RETROGRADE PYLEGRAM  WITH STENT PLACEMENT;  Surgeon: Bjorn Loser, MD;  Location: [facility] ORS;  Service: Urology;  Laterality: Left;   CYSTOSCOPY WITH RETROGRADE PYELOGRAM, URETEROSCOPY AND STENT PLACEMENT Left 07/28/2015   Procedure: CYSTOSCOPY, URETEROSCOPY, WITH BASKET STONE REMOVAL;  Surgeon: Raynelle Bring, MD;  Location: [facility] ORS;  Service: Urology;  Laterality: Left;   ENDARTERECTOMY FEMORAL Right 12/21/2020   Procedure: RIGHT FEMORAL ENDARTERECTOMY;  Surgeon: Waynetta Sandy, MD;  Location: [place];  Service: Vascular;  Laterality: Right;   EYE SURGERY Right 08/2014   Cataract; removed  bilat   FEMORAL-POPLITEAL BYPASS GRAFT  07/19/2011   Procedure: BYPASS GRAFT FEMORAL-POPLITEAL ARTERY;  Surgeon: Mal Misty, MD;  Location: [place];  Service: Vascular;  Laterality: Right;  Right Femoral - Popliteal  Bypss with saphenous vein   FEMORAL-POPLITEAL BYPASS GRAFT Right 12/21/2020   Procedure: RIGHT FEMORAL-POPLITEAL ARTERY BYPASS GRAFT WITH PTFE;  Surgeon: Waynetta Sandy, MD;  [facility];  Service: Vascular;  Laterality: Right;   HERNIA REPAIR     right side,lft   INTRAOPERATIVE ARTERIOGRAM  07/19/2011   Procedure: INTRA OPERATIVE ARTERIOGRAM;   Surgeon: Mal Misty, MD;  Location: [place];  Service: Vascular;  Laterality: Right;   NM MYOVIEW LTD  07/2011   Normal EF, very small area of basal-mid inferior ischemia; LOW RISK   PENILE PROSTHESIS  REMOVAL     PENILE PROSTHESIS IMPLANT     PR VEIN BYPASS GRAFT,AORTO-FEM-POP  07/19/2011   Right  Fem/Pop BPG   PROSTATE BIOPSY     prostatectomy retropubic radical  07/15/2002   with nerve sparing, Gleason 3+4=7   Reclast  07/25/2013   RENAL DUPLEX  07/01/2012   Left renal artery 60-99% diameter reduction   TRANSTHORACIC ECHOCARDIOGRAM  01/2017   Normal EF and overall function   Patient Active Problem List   Diagnosis Date Noted   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   Bilateral low back pain without sciatica 01/19/2021   Cardiovascular symptoms 01/19/2021   Chronic kidney disease, stage 3a ([place]) 01/19/2021   Encounter for general adult medical examination with abnormal findings 01/19/2021   Gangrene of toe of right foot ([place]) 01/19/2021   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 ([place]) 01/19/2021   History of pneumonia 01/19/2021   Moderate persistent asthma with (acute) exacerbation 01/19/2021   Nephrosclerosis 01/19/2021   Osteoporosis 01/19/2021   Scoliosis 01/19/2021   Tobacco user 01/19/2021   Urinary incontinence 01/19/2021   Malnutrition of moderate degree 12/29/2020   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 ([place]) 12/28/2020   Ischemic ulcer of foot, right, with fat layer exposed ([place]) 12/19/2020   COPD (chronic obstructive pulmonary disease) ([place]) 12/19/2020   Lumbar radiculopathy 05/28/2020   Posterior vitreous detachment of left eye 08/13/2019   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 08/13/2019   Retinal layer separation 08/13/2019   Primary open angle glaucoma of both eyes, severe stage 08/13/2019   Rhinitis, chronic 03/12/2018   Degeneration of lumbar intervertebral disc  08/28/2017   Lung field abnormal finding on examination 07/19/2017   COPD GOLD II   07/19/2017   Carotid occlusion, right 08/11/2015   Aneurysm, cerebral 08/11/2015   Hydronephrosis, left 06/06/2015   Kidney disease, chronic, stage II (GFR 60-89 ml/min) 06/06/2015   Atherosclerosis of aortic bifurcation and common iliac arteries ([place]) 03/03/2015   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   Dizziness 12/16/2013   Vertigo    Left renal artery stenosis - 70% by angiography; increased velocity on recent Dopplers (60-99%) 12/08/2012    Class: Diagnosis of   Hyperlipidemia LDL goal <70 12/08/2012   Personal history of colonic adenomas 11/15/2012   Pain in limb 02/13/2012   Peripheral vascular disease ([place]) 08/07/2011   Atherosclerosis of native artery of extremity with intermittent claudication ([place]) 06/13/2011   Prostate cancer ([place]) 02/16/2011   Essential hypertension 12/09/2010    PCP: Deland Pretty, MD  REFERRING PROVIDER: Gregor Hams, MD  REFERRING DIAG:  M54.16 (ICD-10-CM) - Lumbar radiculopathy  Rationale for Evaluation and Treatment Rehabilitation  THERAPY DIAG:  Other low back pain  Muscle weakness (generalized)  ONSET DATE: years,   SUBJECTIVE:                                                                                                                                                                                           SUBJECTIVE STATEMENT: States he has has back pain and hip pain for years. States he feels pretty good today. States he has benefits from injections but no history of surgery. Reports he can stand for about 10 minutes at a time. States he has been using a cane but he didn't feel like he needed it today as he was feeling good. States he has a walker but he doesn't use it. Reports he does all kinds of stretches (hamstring stretches, IT stretch, SKC, LTR, clamshells which he  learned in PT about a year ago. States he didn't feel the PT actually helped. States he walks but sometimes the pain keeps him from walking. States his left side feels worse and weak.  Pain radiates into B Thighs and feels weak, ESI, has back brace, B injections to hips last MD visit 08/24/21  PERTINENT HISTORY:  Spinal stenosis, scoliosis,, right vein replacement, inoperable brain aneurysm   PAIN:  Are you having pain? Yes: NPRS scale: 3/10 Pain location: B hips  Pain description: dull ache Aggravating factors: walking a certain way, bending over, standing Relieving factors: sitting, injections   PRECAUTIONS: None  WEIGHT BEARING RESTRICTIONS No  FALLS:  Has patient fallen in last 6 months? No  LIVING ENVIRONMENT: Lives with: lives with their family Lives in: House/apartment Stairs: No Has following equipment at home: Single point cane and Environmental consultant - 2 wheeled  OCCUPATION: retired, Research officer, trade union, baseball  PLOF: Independent with basic ADLs  PATIENT GOALS improve ability to walk, stand for 20-30 minutes at time   OBJECTIVE:   DIAGNOSTIC FINDINGS:  08/24/21 low back xray IMPRESSION: 1. Moderate severity levoscoliosis of the lumbar spine with marked severity multilevel degenerative changes.  08/24/21 pelvis xray IMPRESSION: Degenerative changes without evidence of an acute osseous abnormality.   SCREENING FOR RED FLAGS: Bowel or bladder incontinence: No Spinal tumors: No Cauda equina syndrome: No Compression fracture: No Abdominal aneurysm: No  COGNITION:  Overall cognitive status: Within functional limits for tasks assessed     POSTURE:  slumped  and leans to the right side, walking - B trendelenburg and wide BOS, unstable with increased frontal plane motion  PALPATION: Tenderness in palpation in B glutes, piriformis, lumbar paraspinals and QL. Increased resting tone  in left lumbar musculature, atrophy in left leg compared to right  LUMBAR ROM:   Active   A/PROM  eval  Flexion 50% limited and increased pain in back and legs  Extension 75% limited and increased pain in back and legs  Right lateral flexion 50% limited and increased pain in back and legs  Left lateral flexion 50% limited and increased pain in back and legs  Right rotation   Left rotation    (Blank rows = not tested)    LE Measurements Lower Extremity Right 08/29/2021 Left 08/29/2021   A/PROM MMT A/PROM MMT  Hip Flexion  4  4-  Hip Extension      Hip Abduction      Hip Adduction      Hip Internal rotation      Hip External rotation      Knee Flexion  4-  2  Knee Extension  4  4-  Ankle Dorsiflexion  4  3+  Ankle Plantarflexion      Ankle Inversion      Ankle Eversion       (Blank rows = not tested)  * pain  LUMBAR SPECIAL TESTS:  + ely's test B    TODAY'S TREATMENT  08/29/2021 Therapeutic Exercise:  Aerobic: Supine: Prone: lying 5 minutes, hamstring curl with stap assist x15 B, quad stretch x5 10" holds B with strap  Seated:  Standing: Neuromuscular Re-education: Manual Therapy: Therapeutic Activity: Self Care: Trigger Point Dry Needling:  Modalities:    PATIENT EDUCATION:  Education details: on current presentation, on HEP, on clinical outcomes score and POC, on silver sneakers and gyms that take insurance, on current presentation Person educated: Patient Education method: Explanation, Media planner, and Handouts Education comprehension: verbalized understanding  HOME EXERCISE PROGRAM: [id number]  ASSESSMENT:  CLINICAL IMPRESSION: Patient is a 81 y.o. male who was seen today for physical therapy evaluation and treatment for B hip and back pain. Patient with weakness L>R, ROM deficits and pain that is limiting overall function and QOL. Patient would benefit from skilled PT to improve overall function.   OBJECTIVE IMPAIRMENTS Abnormal gait, decreased activity tolerance, decreased balance, decreased mobility, difficulty walking, decreased ROM,  decreased strength, postural dysfunction, and pain.   ACTIVITY LIMITATIONS lifting, standing, stairs, transfers, bed mobility, and locomotion level  PARTICIPATION LIMITATIONS: meal prep, cleaning, and community activity  PERSONAL FACTORS Age and 1-2 comorbidities: spinal stenosis, atrophy left leg  are also affecting patient's functional outcome.   REHAB POTENTIAL: Good  CLINICAL DECISION MAKING: Evolving/moderate complexity  EVALUATION COMPLEXITY: Moderate  GOALS: Goals reviewed with patient?  yes  SHORT TERM GOALS:  Patient will be independent in self management strategies to improve quality of life and functional outcomes. Baseline: new program Target date: 09/26/2021 Goal status: INITIAL  2.  Patient will report at least 50% improvement in overall symptoms and/or function to demonstrate improved functional mobility Baseline: 0% Target date: 09/26/2021 Goal status: INITIAL  3.  Patient will be able to perform 5 prone hamstring curls on left LE without assist to demonstrate improved LE strength Baseline: unable Target date: 09/26/2021 Goal status: INITIAL     LONG TERM GOALS:  Patient will report at least 75% improvement in overall symptoms and/or function to demonstrate improved functional mobility Baseline: 0% Target date: 10/24/2021 Goal status: INITIAL  2.  Patient will report being able to stand for at least 30 minutes at a time to improve standing endurance  Baseline: 15 minutes max Target date: 10/24/2021 Goal status: INITIAL  3.  Patient will be able to demonstrate painfree lumbar ROM to improve QOL. Baseline:  Target date: 10/24/2021 Goal status: INITIAL      PLAN: PT FREQUENCY: 2x/week  PT DURATION: 8 weeks  PLANNED INTERVENTIONS: Therapeutic exercises, Therapeutic activity, Neuromuscular re-education, Balance training, Gait training, Patient/Family education, Self Care, Joint mobilization, Joint manipulation, Stair training, Orthotic/Fit training,  DME instructions, Aquatic Therapy, Dry Needling, Electrical stimulation, Cryotherapy, Moist heat, Ionotophoresis '4mg'$ /ml Dexamethasone, and Manual therapy.  PLAN FOR NEXT SESSION: hamstring strengthening, prone lying, LE strengthening, gait training, balance   2:42 PM, 08/29/21 Jerene Pitch, DPT Physical Therapy with [facility]

## 2021-08-30 NOTE — Progress Notes (Signed)
Spoke with pt's spouse, Inez Catalina, [place] per [facility] and notified of results per Dr. Melvyn Novas. She verbalized understanding and denied any questions. Copy to PCP and [place].

## 2021-08-30 NOTE — Therapy (Unsigned)
OUTPATIENT PHYSICAL THERAPY TREATMENT NOTE   Patient Name: Lance Dillon MRN: [id number] DOB:[birth year], 81 y.o., male Today's Date: 08/31/2021    END OF SESSION:   PT End of Session - 08/31/21 0803     Visit Number 2    Number of Visits 16    Date for PT Re-Evaluation 10/24/21    Authorization Type [employer]    Progress Note Due on Visit 10    PT Start Time 0804    PT Stop Time 0842    PT Time Calculation (min) 38 min    Activity Tolerance Patient tolerated treatment well    Behavior During Therapy [facility] for tasks assessed/performed             Past Medical History:  Diagnosis Date   Anemia    Arthritis    Atherosclerosis of aortic bifurcation and common iliac arteries ([place]) 03/2015   Noted on abdominal/renal Dopplers   Blind right eye    Carotid artery, internal, 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   Bilateral: Noted on MRA of brain in November 2015 with collateral flow from posterior circulation and external collaterals reconstituting anterior circulation   Chronic kidney disease    Colon polyps 2004, 07, 14   COPD (chronic obstructive pulmonary disease) (HCC)    GERD (gastroesophageal reflux disease)    diet controlled, no meds   Glaucoma    left eye    Hx of radiation therapy 03/22/11 to 05/08/11   PSA recurrent carcinoma of prostate   Hypercholesterolemia    Hypertension    Macular degeneration of both eyes    Peripheral vascular disease ([place]) 07/2011   Bilateral SFA stenosis: Status post right femoropopliteal bypass - Dr. 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 mm x 8 mm fusiform aneurysm    Prostate cancer ([place]) 2004   Renal artery stenosis (HCC)    70% left   Stones in the urinary tract    Stroke [facility])    Urinary incontinence    Past Surgical History:  Procedure Laterality Date   ABDOMINAL AORTAGRAM N/A 06/20/2011   Procedure: ABDOMINAL Maxcine Ham;  Surgeon: Serafina Mitchell, MD;  Location: [facility] CATH LAB;  Service: Cardiovascular;   Laterality: N/A;   ABDOMINAL AORTOGRAM W/LOWER EXTREMITY N/A 12/20/2020   Procedure: ABDOMINAL AORTOGRAM W/LOWER EXTREMITY;  Surgeon: Waynetta Sandy, MD;  Location: [place] CV LAB;  Service: Cardiovascular;  Laterality: N/A;   AMPUTATION Right 12/28/2020   Procedure: RIGHT 4TH TOE AMPUTATION;  Surgeon: Waynetta Sandy, MD;  Location: [place];  Service: Vascular;  Laterality: Right;   CARDIOVASCULAR STRESS TEST  07/06/2011   Evidence of mild ischemia in Basal Inferior and Mid Inferior regions, no significant wall motion abnormalities noted.   clamp to penis     for urinary control   COLONOSCOPY     COLONOSCOPY W/ BIOPSIES     multiple    CYSTOSCOPY W/ URETERAL STENT PLACEMENT Left 06/07/2015   Procedure: CYSTOSCOPY, RETROGRADE PYLEGRAM  WITH STENT PLACEMENT;  Surgeon: Bjorn Loser, MD;  Location: [facility] ORS;  Service: Urology;  Laterality: Left;   CYSTOSCOPY WITH RETROGRADE PYELOGRAM, URETEROSCOPY AND STENT PLACEMENT Left 07/28/2015   Procedure: CYSTOSCOPY, URETEROSCOPY, WITH BASKET STONE REMOVAL;  Surgeon: Raynelle Bring, MD;  Location: [facility] ORS;  Service: Urology;  Laterality: Left;   ENDARTERECTOMY FEMORAL Right 12/21/2020   Procedure: RIGHT FEMORAL ENDARTERECTOMY;  Surgeon: Waynetta Sandy, MD;  Location: [place];  Service: Vascular;  Laterality: Right;   EYE SURGERY  Right 08/2014   Cataract; removed bilat   FEMORAL-POPLITEAL BYPASS GRAFT  07/19/2011   Procedure: BYPASS GRAFT FEMORAL-POPLITEAL ARTERY;  Surgeon: Mal Misty, MD;  Location: [place];  Service: Vascular;  Laterality: Right;  Right Femoral - Popliteal  Bypss with saphenous vein   FEMORAL-POPLITEAL BYPASS GRAFT Right 12/21/2020   Procedure: RIGHT FEMORAL-POPLITEAL ARTERY BYPASS GRAFT WITH PTFE;  Surgeon: Waynetta Sandy, MD;  Location: [facility] OR;  Service: Vascular;  Laterality: Right;   HERNIA REPAIR     right side,lft   INTRAOPERATIVE ARTERIOGRAM  07/19/2011   Procedure: INTRA OPERATIVE  ARTERIOGRAM;  Surgeon: Mal Misty, MD;  Location: [place];  Service: Vascular;  Laterality: Right;   NM MYOVIEW LTD  07/2011   Normal EF, very small area of basal-mid inferior ischemia; LOW RISK   PENILE PROSTHESIS  REMOVAL     PENILE PROSTHESIS IMPLANT     PR VEIN BYPASS GRAFT,AORTO-FEM-POP  07/19/2011   Right  Fem/Pop BPG   PROSTATE BIOPSY     prostatectomy retropubic radical  07/15/2002   with nerve sparing, Gleason 3+4=7   Reclast  07/25/2013   RENAL DUPLEX  07/01/2012   Left renal artery 60-99% diameter reduction   TRANSTHORACIC ECHOCARDIOGRAM  01/2017   Normal EF and overall function   Patient Active Problem List   Diagnosis Date Noted   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   Bilateral low back pain without sciatica 01/19/2021   Cardiovascular symptoms 01/19/2021   Chronic kidney disease, stage 3a ([place]) 01/19/2021   Encounter for general adult medical examination with abnormal findings 01/19/2021   Gangrene of toe of right foot ([place]) 01/19/2021   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 ([place]) 01/19/2021   History of pneumonia 01/19/2021   Moderate persistent asthma with (acute) exacerbation 01/19/2021   Nephrosclerosis 01/19/2021   Osteoporosis 01/19/2021   Scoliosis 01/19/2021   Tobacco user 01/19/2021   Urinary incontinence 01/19/2021   Malnutrition of moderate degree 12/29/2020   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 ([place]) 12/28/2020   Ischemic ulcer of foot, right, with fat layer exposed ([place]) 12/19/2020   COPD (chronic obstructive pulmonary disease) ([place]) 12/19/2020   Lumbar radiculopathy 05/28/2020   Posterior vitreous detachment of left eye 08/13/2019   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 08/13/2019   Retinal layer separation 08/13/2019   Primary open angle glaucoma of both eyes, severe stage 08/13/2019   Rhinitis, chronic 03/12/2018   Degeneration of lumbar  intervertebral disc 08/28/2017   Lung field abnormal finding on examination 07/19/2017   COPD GOLD II   07/19/2017   Carotid occlusion, right 08/11/2015   Aneurysm, cerebral 08/11/2015   Hydronephrosis, left 06/06/2015   Kidney disease, chronic, stage II (GFR 60-89 ml/min) 06/06/2015   Atherosclerosis of aortic bifurcation and common iliac arteries ([place]) 03/03/2015   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   Dizziness 12/16/2013   Vertigo    Left renal artery stenosis - 70% by angiography; increased velocity on recent Dopplers (60-99%) 12/08/2012    Class: Diagnosis of   Hyperlipidemia LDL goal <70 12/08/2012   Personal history of colonic adenomas 11/15/2012   Pain in limb 02/13/2012   Peripheral vascular disease ([place]) 08/07/2011   Atherosclerosis of native artery of extremity with intermittent claudication ([place]) 06/13/2011   Prostate cancer ([place]) 02/16/2011   Essential hypertension 12/09/2010   PCP: Deland Pretty, MD   REFERRING PROVIDER: Georgina Snell,  Rebekah Chesterfield, MD   REFERRING DIAG: M54.16 (ICD-10-CM) - Lumbar radiculopathy   Rationale for Evaluation and Treatment Rehabilitation   THERAPY DIAG:  Other low back pain   Muscle weakness (generalized)   ONSET DATE: years,    SUBJECTIVE:                                                                                                                                                                                            SUBJECTIVE STATEMENT: 08/31/2021 States that he feels good and is wearing his lumbar belt and it helps hip stay upright and he wears it as a preventive.  Eval: States he has has back pain and hip pain for years. States he feels pretty good today. States he has benefits from injections but no history of surgery. Reports he can stand for about 10 minutes at a time. States he has been using a cane but he didn't feel like he needed it today as he  was feeling good. States he has a walker but he doesn't use it. Reports he does all kinds of stretches (hamstring stretches, IT stretch, SKC, LTR, clamshells which he learned in PT about a year ago. States he didn't feel the PT actually helped. States he walks but sometimes the pain keeps him from walking. States his left side feels worse and weak.   Pain radiates into B Thighs and feels weak, ESI, has back brace, B injections to hips last MD visit 08/24/21   PERTINENT HISTORY:  Spinal stenosis, scoliosis,, right vein replacement, inoperable brain aneurysm    PAIN:  Are you having pain? Yes: NPRS scale: 3/10 Pain location: B hips  Pain description: dull ache Aggravating factors: walking a certain way, bending over, standing Relieving factors: sitting, injections     PRECAUTIONS: None   WEIGHT BEARING RESTRICTIONS No   FALLS:  Has patient fallen in last 6 months? No   LIVING ENVIRONMENT: Lives with: lives with their family Lives in: House/apartment Stairs: No Has following equipment at home: Single point cane and Environmental consultant - 2 wheeled   OCCUPATION: retired, Research officer, trade union, baseball   PLOF: Independent with basic ADLs   PATIENT GOALS improve ability to walk, stand for 20-30 minutes at time     OBJECTIVE:    DIAGNOSTIC FINDINGS:  08/24/21 low back xray IMPRESSION: 1. Moderate severity levoscoliosis of the lumbar spine with marked severity multilevel degenerative changes.   08/24/21 pelvis xray IMPRESSION: Degenerative changes without evidence of an acute osseous abnormality.     SCREENING FOR RED FLAGS: Bowel or bladder incontinence: No Spinal tumors: No Cauda equina syndrome: No Compression fracture: No Abdominal  aneurysm: No   COGNITION:           Overall cognitive status: Within functional limits for tasks assessed                   POSTURE:  slumped  and leans to the right side, walking - B trendelenburg and wide BOS, unstable with increased frontal plane  motion   PALPATION: Tenderness in palpation in B glutes, piriformis, lumbar paraspinals and QL. Increased resting tone in left lumbar musculature, atrophy in left leg compared to right   LUMBAR ROM:    Active  A/PROM  eval  Flexion 50% limited and increased pain in back and legs  Extension 75% limited and increased pain in back and legs  Right lateral flexion 50% limited and increased pain in back and legs  Left lateral flexion 50% limited and increased pain in back and legs  Right rotation    Left rotation     (Blank rows = not tested)                LE Measurements       Lower Extremity Right 08/29/2021 Left 08/29/2021    A/PROM MMT A/PROM MMT  Hip Flexion   4   4-  Hip Extension          Hip Abduction          Hip Adduction          Hip Internal rotation          Hip External rotation          Knee Flexion   4-   2  Knee Extension   4   4-  Ankle Dorsiflexion   4   3+  Ankle Plantarflexion          Ankle Inversion          Ankle Eversion           (Blank rows = not tested)            * pain   LUMBAR SPECIAL TESTS:  + ely's test B       TODAY'S TREATMENT  08/31/2021  Therapeutic Exercise:    Aerobic: Supine: B hamstring curls on ball x3 1 minute bouts, hamstring iso on ball 1 minute bout 5" holds alternating x3, bridges x3 1 minutes bouts Prone: lying 5 minutes, hamstring curl with PT assist 2x10 B,    Seated: LAQs 4x5 5" holds B    Standing: Neuromuscular Re-education: Manual Therapy: Therapeutic Activity: Self Care: Trigger Point Dry Needling:  Modalities:      PATIENT EDUCATION:  Education details: on using cane with walking   Person educated: Patient Education method: Explanation, Demonstration, and Handouts Education comprehension: verbalized understanding   HOME EXERCISE PROGRAM: [id number]   ASSESSMENT:   CLINICAL IMPRESSION: 08/31/2021 Progressed strengthening exercises which were tolerated well. PT assisted with left leg secondary to  weakness Advised patient to use cane with walking secondary to losing balance to the right with walking. No pain just fatigue noted end of session.  Eval: Patient is a 81 y.o. male who was seen today for physical therapy evaluation and treatment for B hip and back pain. Patient with weakness L>R, ROM deficits and pain that is limiting overall function and QOL. Patient would benefit from skilled PT to improve overall function.     OBJECTIVE IMPAIRMENTS Abnormal gait, decreased activity tolerance, decreased balance, decreased mobility, difficulty walking, decreased ROM, decreased strength, postural dysfunction,  and pain.    ACTIVITY LIMITATIONS lifting, standing, stairs, transfers, bed mobility, and locomotion level   PARTICIPATION LIMITATIONS: meal prep, cleaning, and community activity   PERSONAL FACTORS Age and 1-2 comorbidities: spinal stenosis, atrophy left leg  are also affecting patient's functional outcome.    REHAB POTENTIAL: Good   CLINICAL DECISION MAKING: Evolving/moderate complexity   EVALUATION COMPLEXITY: Moderate   GOALS: Goals reviewed with patient?  yes   SHORT TERM GOALS:   Patient will be independent in self management strategies to improve quality of life and functional outcomes. Baseline: new program Target date: 09/26/2021 Goal status: INITIAL   2.  Patient will report at least 50% improvement in overall symptoms and/or function to demonstrate improved functional mobility Baseline: 0% Target date: 09/26/2021 Goal status: INITIAL   3.  Patient will be able to perform 5 prone hamstring curls on left LE without assist to demonstrate improved LE strength Baseline: unable Target date: 09/26/2021 Goal status: INITIAL         LONG TERM GOALS:   Patient will report at least 75% improvement in overall symptoms and/or function to demonstrate improved functional mobility Baseline: 0% Target date: 10/24/2021 Goal status: INITIAL   2.  Patient will report being  able to stand for at least 30 minutes at a time to improve standing endurance  Baseline: 15 minutes max Target date: 10/24/2021 Goal status: INITIAL   3.  Patient will be able to demonstrate painfree lumbar ROM to improve QOL. Baseline:  Target date: 10/24/2021 Goal status: INITIAL           PLAN: PT FREQUENCY: 2x/week   PT DURATION: 8 weeks   PLANNED INTERVENTIONS: Therapeutic exercises, Therapeutic activity, Neuromuscular re-education, Balance training, Gait training, Patient/Family education, Self Care, Joint mobilization, Joint manipulation, Stair training, Orthotic/Fit training, DME instructions, Aquatic Therapy, Dry Needling, Electrical stimulation, Cryotherapy, Moist heat, Ionotophoresis '4mg'$ /ml Dexamethasone, and Manual therapy.   PLAN FOR NEXT SESSION: hamstring strengthening, prone lying, LE strengthening, gait training, balance    8:42 AM, 08/31/21 Jerene Pitch, DPT Physical Therapy with [facility]

## 2021-08-31 ENCOUNTER — Encounter: Payer: Self-pay | Admitting: Physical Therapy

## 2021-08-31 ENCOUNTER — Ambulatory Visit (INDEPENDENT_AMBULATORY_CARE_PROVIDER_SITE_OTHER): Payer: Medicare HMO | Admitting: Physical Therapy

## 2021-08-31 DIAGNOSIS — M6281 Muscle weakness (generalized): Secondary | ICD-10-CM | POA: Diagnosis not present

## 2021-08-31 DIAGNOSIS — M5459 Other low back pain: Secondary | ICD-10-CM | POA: Diagnosis not present

## 2021-09-01 DIAGNOSIS — E559 Vitamin D deficiency, unspecified: Secondary | ICD-10-CM | POA: Diagnosis not present

## 2021-09-01 DIAGNOSIS — M810 Age-related osteoporosis without current pathological fracture: Secondary | ICD-10-CM | POA: Diagnosis not present

## 2021-09-06 ENCOUNTER — Encounter: Payer: Self-pay | Admitting: Physical Therapy

## 2021-09-06 ENCOUNTER — Ambulatory Visit (INDEPENDENT_AMBULATORY_CARE_PROVIDER_SITE_OTHER): Payer: Medicare HMO | Admitting: Physical Therapy

## 2021-09-06 DIAGNOSIS — M6281 Muscle weakness (generalized): Secondary | ICD-10-CM | POA: Diagnosis not present

## 2021-09-06 DIAGNOSIS — M5459 Other low back pain: Secondary | ICD-10-CM

## 2021-09-06 NOTE — Therapy (Signed)
OUTPATIENT PHYSICAL THERAPY TREATMENT NOTE   Patient Name: CHAN SHEAHAN MRN: [id number] DOB:Mar 12, 1940, 81 y.o., male Today's Date: 09/06/2021    END OF SESSION:   PT End of Session - 09/06/21 0801     Visit Number 3    Number of Visits 16    Date for PT Re-Evaluation 10/24/21    Authorization Type [employer]    Progress Note Due on Visit 10    PT Start Time 0802    PT Stop Time 0841    PT Time Calculation (min) 39 min    Activity Tolerance Patient tolerated treatment well    Behavior During Therapy [facility] for tasks assessed/performed             Past Medical History:  Diagnosis Date   Anemia    Arthritis    Atherosclerosis of aortic bifurcation and common iliac arteries ([place]) 03/2015   Noted on abdominal/renal Dopplers   Blind right eye    Carotid artery, internal, 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   Bilateral: Noted on MRA of brain in November 2015 with collateral flow from posterior circulation and external collaterals reconstituting anterior circulation   Chronic kidney disease    Colon polyps 2004, 07, 14   COPD (chronic obstructive pulmonary disease) (HCC)    GERD (gastroesophageal reflux disease)    diet controlled, no meds   Glaucoma    left eye    Hx of radiation therapy 03/22/11 to 05/08/11   PSA recurrent carcinoma of prostate   Hypercholesterolemia    Hypertension    Macular degeneration of both eyes    Peripheral vascular disease ([place]) 07/2011   Bilateral SFA stenosis: Status post right femoropopliteal bypass - Dr. 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 mm x 8 mm fusiform aneurysm    Prostate cancer ([place]) 2004   Renal artery stenosis (HCC)    70% left   Stones in the urinary tract    Stroke [facility])    Urinary incontinence    Past Surgical History:  Procedure Laterality Date   ABDOMINAL AORTAGRAM N/A 06/20/2011   Procedure: ABDOMINAL Maxcine Ham;  Surgeon: Serafina Mitchell, MD;  Location: [facility] CATH LAB;  Service: Cardiovascular;   Laterality: N/A;   ABDOMINAL AORTOGRAM W/LOWER EXTREMITY N/A 12/20/2020   Procedure: ABDOMINAL AORTOGRAM W/LOWER EXTREMITY;  Surgeon: Waynetta Sandy, MD;  Location: [place] CV LAB;  Service: Cardiovascular;  Laterality: N/A;   AMPUTATION Right 12/28/2020   Procedure: RIGHT 4TH TOE AMPUTATION;  Surgeon: Waynetta Sandy, MD;  Location: [place];  Service: Vascular;  Laterality: Right;   CARDIOVASCULAR STRESS TEST  07/06/2011   Evidence of mild ischemia in Basal Inferior and Mid Inferior regions, no significant wall motion abnormalities noted.   clamp to penis     for urinary control   COLONOSCOPY     COLONOSCOPY W/ BIOPSIES     multiple    CYSTOSCOPY W/ URETERAL STENT PLACEMENT Left 06/07/2015   Procedure: CYSTOSCOPY, RETROGRADE PYLEGRAM  WITH STENT PLACEMENT;  Surgeon: Bjorn Loser, MD;  Location: [facility] ORS;  Service: Urology;  Laterality: Left;   CYSTOSCOPY WITH RETROGRADE PYELOGRAM, URETEROSCOPY AND STENT PLACEMENT Left 07/28/2015   Procedure: CYSTOSCOPY, URETEROSCOPY, WITH BASKET STONE REMOVAL;  Surgeon: Raynelle Bring, MD;  Location: [facility] OR[facility];  Service: Urology;  Laterality: Left;   ENDARTERECTOMY FEMORAL Right 12/21/2020   Procedure: RIGHT FEMORAL ENDARTERECTOMY;  Surgeon: Waynetta Sandy, MD;  Location: [place];  Service: Vascular;  Laterality: Right;   EYE SURGERY  Right 08/2014   Cataract; removed bilat   FEMORAL-POPLITEAL BYPASS GRAFT  07/19/2011   Procedure: BYPASS GRAFT FEMORAL-POPLITEAL ARTERY;  Surgeon: Mal Misty, MD;  Location: [place];  Service: Vascular;  Laterality: Right;  Right Femoral - Popliteal  Bypss with saphenous vein   FEMORAL-POPLITEAL BYPASS GRAFT Right 12/21/2020   Procedure: RIGHT FEMORAL-POPLITEAL ARTERY BYPASS GRAFT WITH PTFE;  Surgeon: Waynetta Sandy, MD;  Location[facility] OR;  Service: Vascular;  Laterality: Right;   HERNIA REPAIR     right side,lft   INTRAOPERATIVE ARTERIOGRAM  07/19/2011   Procedure: INTRA OPERATIVE  ARTERIOGRAM;  Surgeon: Mal Misty, MD;  Location: [place];  Service: Vascular;  Laterality: Right;   NM MYOVIEW LTD  07/2011   Normal EF, very small area of basal-mid inferior ischemia; LOW RISK   PENILE PROSTHESIS  REMOVAL     PENILE PROSTHESIS IMPLANT     PR VEIN BYPASS GRAFT,AORTO-FEM-POP  07/19/2011   Right  Fem/Pop BPG   PROSTATE BIOPSY     prostatectomy retropubic radical  07/15/2002   with nerve sparing, Gleason 3+4=7   Reclast  07/25/2013   RENAL DUPLEX  07/01/2012   Left renal artery 60-99% diameter reduction   TRANSTHORACIC ECHOCARDIOGRAM  01/2017   Normal EF and overall function   Patient Active Problem List   Diagnosis Date Noted   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   Bilateral low back pain without sciatica 01/19/2021   Cardiovascular symptoms 01/19/2021   Chronic kidney disease, stage 3a ([place]) 01/19/2021   Encounter for general adult medical examination with abnormal findings 01/19/2021   Gangrene of toe of right foot ([place]) 01/19/2021   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 ([place]) 01/19/2021   History of pneumonia 01/19/2021   Moderate persistent asthma with (acute) exacerbation 01/19/2021   Nephrosclerosis 01/19/2021   Osteoporosis 01/19/2021   Scoliosis 01/19/2021   Tobacco user 01/19/2021   Urinary incontinence 01/19/2021   Malnutrition of moderate degree 12/29/2020   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 ([place]) 12/28/2020   Ischemic ulcer of foot, right, with fat layer exposed ([place]) 12/19/2020   COPD (chronic obstructive pulmonary disease) ([place]) 12/19/2020   Lumbar radiculopathy 05/28/2020   Posterior vitreous detachment of left eye 08/13/2019   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 08/13/2019   Retinal layer separation 08/13/2019   Primary open angle glaucoma of both eyes, severe stage 08/13/2019   Rhinitis, chronic 03/12/2018   Degeneration of lumbar  intervertebral disc 08/28/2017   Lung field abnormal finding on examination 07/19/2017   COPD GOLD II   07/19/2017   Carotid occlusion, right 08/11/2015   Aneurysm, cerebral 08/11/2015   Hydronephrosis, left 06/06/2015   Kidney disease, chronic, stage II (GFR 60-89 ml/min) 06/06/2015   Atherosclerosis of aortic bifurcation and common iliac arteries ([place]) 03/03/2015   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   Dizziness 12/16/2013   Vertigo    Left renal artery stenosis - 70% by angiography; increased velocity on recent Dopplers (60-99%) 12/08/2012    Class: Diagnosis of   Hyperlipidemia LDL goal <70 12/08/2012   Personal history of colonic adenomas 11/15/2012   Pain in limb 02/13/2012   Peripheral vascular disease ([place]) 08/07/2011   Atherosclerosis of native artery of extremity with intermittent claudication ([place]) 06/13/2011   Prostate cancer ([place]) 02/16/2011   Essential hypertension 12/09/2010   PCP: Deland Pretty, MD   REFERRING PROVIDER: Georgina Snell,  Rebekah Chesterfield, MD   REFERRING DIAG: M54.16 (ICD-10-CM) - Lumbar radiculopathy   Rationale for Evaluation and Treatment Rehabilitation   THERAPY DIAG:  Other low back pain   Muscle weakness (generalized)   ONSET DATE: years,    SUBJECTIVE:                                                                                                                                                                                            SUBJECTIVE STATEMENT: 09/06/2021 States that the shot really helped his back and is using the cane when he is out of the house. States that the stomach exercises are diffficult to do because of strength  Eval: States he has has back pain and hip pain for years. States he feels pretty good today. States he has benefits from injections but no history of surgery. Reports he can stand for about 10 minutes at a time. States he has been using a cane  but he didn't feel like he needed it today as he was feeling good. States he has a walker but he doesn't use it. Reports he does all kinds of stretches (hamstring stretches, IT stretch, SKC, LTR, clamshells which he learned in PT about a year ago. States he didn't feel the PT actually helped. States he walks but sometimes the pain keeps him from walking. States his left side feels worse and weak.   Pain radiates into B Thighs and feels weak, ESI, has back brace, B injections to hips last MD visit 08/24/21   PERTINENT HISTORY:  Spinal stenosis, scoliosis,, right vein replacement, inoperable brain aneurysm    PAIN:  Are you having pain? Yes: NPRS scale: 3/10 Pain location: B hips  Pain description: dull ache Aggravating factors: walking a certain way, bending over, standing Relieving factors: sitting, injections     PRECAUTIONS: None   WEIGHT BEARING RESTRICTIONS No   FALLS:  Has patient fallen in last 6 months? No   LIVING ENVIRONMENT: Lives with: lives with their family Lives in: House/apartment Stairs: No Has following equipment at home: Single point cane and Environmental consultant - 2 wheeled   OCCUPATION: retired, Research officer, trade union, baseball   PLOF: Independent with basic ADLs   PATIENT GOALS improve ability to walk, stand for 20-30 minutes at time     OBJECTIVE:    DIAGNOSTIC FINDINGS:  08/24/21 low back xray IMPRESSION: 1. Moderate severity levoscoliosis of the lumbar spine with marked severity multilevel degenerative changes.   08/24/21 pelvis xray IMPRESSION: Degenerative changes without evidence of an acute osseous abnormality.     SCREENING FOR RED FLAGS: Bowel or bladder incontinence: No Spinal tumors: No  Cauda equina syndrome: No Compression fracture: No Abdominal aneurysm: No   COGNITION:           Overall cognitive status: Within functional limits for tasks assessed                   POSTURE:  slumped  and leans to the right side, walking - B trendelenburg and  wide BOS, unstable with increased frontal plane motion   PALPATION: Tenderness in palpation in B glutes, piriformis, lumbar paraspinals and QL. Increased resting tone in left lumbar musculature, atrophy in left leg compared to right   LUMBAR ROM:    Active  A/PROM  eval  Flexion 50% limited and increased pain in back and legs  Extension 75% limited and increased pain in back and legs  Right lateral flexion 50% limited and increased pain in back and legs  Left lateral flexion 50% limited and increased pain in back and legs  Right rotation    Left rotation     (Blank rows = not tested)                LE Measurements       Lower Extremity Right 08/29/2021 Left 08/29/2021    A/PROM MMT A/PROM MMT  Hip Flexion   4   4-  Hip Extension          Hip Abduction          Hip Adduction          Hip Internal rotation          Hip External rotation          Knee Flexion   4-   2  Knee Extension   4   4-  Ankle Dorsiflexion   4   3+  Ankle Plantarflexion          Ankle Inversion          Ankle Eversion           (Blank rows = not tested)            * pain   LUMBAR SPECIAL TESTS:  + ely's test B       TODAY'S TREATMENT  09/06/2021 Therapeutic Exercise:    s/l: hamstring curl  2x20 B Supine: ,2x12 5" holds, hamstring iso on ball 5" holds 1 minute bouts x3, hip add iso 5" holds ball hook lying 1 minute bouts x3, bridges 2x12 2" holds Prone:    Seated: STS with weight through mid foot 3x10, LAQs x5 10" holds B    Standing: heel raises 2x5 5" holds, calf stretch x2 30" holds Neuromuscular Re-education: Manual Therapy: Therapeutic Activity: Self Care: Trigger Point Dry Needling:  Modalities:      PATIENT EDUCATION:  Education details: on HEP Person educated: Patient Education method: Explanation, Media planner, and Handouts Education comprehension: verbalized understanding   HOME EXERCISE PROGRAM: [id number]   ASSESSMENT:   CLINICAL IMPRESSION: 09/06/2021 Progressed  exercises and all were tolerated well. Fatigue in legs but no pain. Verbal cues to press through midfoot with STS as he tends to keep weight in heels and loses balance. No pain noted during session. Will continue with current POC as tolerated.   Eval: Patient is a 81 y.o. male who was seen today for physical therapy evaluation and treatment for B hip and back pain. Patient with weakness L>R, ROM deficits and pain that is limiting overall function and QOL. Patient would benefit from skilled PT to improve overall  function.     OBJECTIVE IMPAIRMENTS Abnormal gait, decreased activity tolerance, decreased balance, decreased mobility, difficulty walking, decreased ROM, decreased strength, postural dysfunction, and pain.    ACTIVITY LIMITATIONS lifting, standing, stairs, transfers, bed mobility, and locomotion level   PARTICIPATION LIMITATIONS: meal prep, cleaning, and community activity   PERSONAL FACTORS Age and 1-2 comorbidities: spinal stenosis, atrophy left leg  are also affecting patient's functional outcome.    REHAB POTENTIAL: Good   CLINICAL DECISION MAKING: Evolving/moderate complexity   EVALUATION COMPLEXITY: Moderate   GOALS: Goals reviewed with patient?  yes   SHORT TERM GOALS:   Patient will be independent in self management strategies to improve quality of life and functional outcomes. Baseline: new program Target date: 09/26/2021 Goal status: INITIAL   2.  Patient will report at least 50% improvement in overall symptoms and/or function to demonstrate improved functional mobility Baseline: 0% Target date: 09/26/2021 Goal status: INITIAL   3.  Patient will be able to perform 5 prone hamstring curls on left LE without assist to demonstrate improved LE strength Baseline: unable Target date: 09/26/2021 Goal status: INITIAL         LONG TERM GOALS:   Patient will report at least 75% improvement in overall symptoms and/or function to demonstrate improved functional  mobility Baseline: 0% Target date: 10/24/2021 Goal status: INITIAL   2.  Patient will report being able to stand for at least 30 minutes at a time to improve standing endurance  Baseline: 15 minutes max Target date: 10/24/2021 Goal status: INITIAL   3.  Patient will be able to demonstrate painfree lumbar ROM to improve QOL. Baseline:  Target date: 10/24/2021 Goal status: INITIAL           PLAN: PT FREQUENCY: 2x/week   PT DURATION: 8 weeks   PLANNED INTERVENTIONS: Therapeutic exercises, Therapeutic activity, Neuromuscular re-education, Balance training, Gait training, Patient/Family education, Self Care, Joint mobilization, Joint manipulation, Stair training, Orthotic/Fit training, DME instructions, Aquatic Therapy, Dry Needling, Electrical stimulation, Cryotherapy, Moist heat, Ionotophoresis '4mg'$ /ml Dexamethasone, and Manual therapy.   PLAN FOR NEXT SESSION: hamstring strengthening, prone lying, LE strengthening, gait training, balance    8:41 AM, 09/06/21 Jerene Pitch, DPT Physical Therapy with [facility]

## 2021-09-08 ENCOUNTER — Ambulatory Visit: Payer: Medicare HMO | Admitting: Physical Therapy

## 2021-09-08 ENCOUNTER — Encounter: Payer: Self-pay | Admitting: Physical Therapy

## 2021-09-08 DIAGNOSIS — M6281 Muscle weakness (generalized): Secondary | ICD-10-CM

## 2021-09-08 DIAGNOSIS — M5459 Other low back pain: Secondary | ICD-10-CM | POA: Diagnosis not present

## 2021-09-08 NOTE — Therapy (Signed)
OUTPATIENT PHYSICAL THERAPY TREATMENT NOTE   Patient Name: Lance Dillon MRN: [id number] DOB:September 13, 1940, 81 y.o., male Today's Date: 09/08/2021    END OF SESSION:   PT End of Session - 09/08/21 1221     Visit Number 4    Number of Visits 16    Date for PT Re-Evaluation 10/24/21    Authorization Type [employer]    Progress Note Due on Visit 10    PT Start Time 1222    PT Stop Time 1300    PT Time Calculation (min) 38 min    Activity Tolerance Patient tolerated treatment well    Behavior During Therapy WFL for tasks assessed/performed             Past Medical History:  Diagnosis Date   Anemia    Arthritis    Atherosclerosis of aortic bifurcation and common iliac arteries ([place]) 03/2015   Noted on abdominal/renal Dopplers   Blind right eye    Carotid artery, internal, 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   Bilateral: Noted on MRA of brain in November 2015 with collateral flow from posterior circulation and external collaterals reconstituting anterior circulation   Chronic kidney disease    Colon polyps 2004, 07, 14   COPD (chronic obstructive pulmonary disease) (HCC)    GERD (gastroesophageal reflux disease)    diet controlled, no meds   Glaucoma    left eye    Hx of radiation therapy 03/22/11 to 05/08/11   PSA recurrent carcinoma of prostate   Hypercholesterolemia    Hypertension    Macular degeneration of both eyes    Peripheral vascular disease ([place]) 07/2011   Bilateral SFA stenosis: Status post right femoropopliteal bypass - Dr. 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 mm x 8 mm fusiform aneurysm    Prostate cancer ([place]) 2004   Renal artery stenosis (HCC)    70% left   Stones in the urinary tract    Stroke [facility])    Urinary incontinence    Past Surgical History:  Procedure Laterality Date   ABDOMINAL AORTAGRAM N/A 06/20/2011   Procedure: ABDOMINAL Maxcine Ham;  Surgeon: Serafina Mitchell, MD;  [facility] C[facility] LAB;  Service: Cardiovascular;   Laterality: N/A;   ABDOMINAL AORTOGRAM W/LOWER EXTREMITY N/A 12/20/2020   Procedure: ABDOMINAL AORTOGRAM W/LOWER EXTREMITY;  Surgeon: Waynetta Sandy, MD;  Location: [place] CV LAB;  Service: Cardiovascular;  Laterality: N/A;   AMPUTATION Right 12/28/2020   Procedure: RIGHT 4TH TOE AMPUTATION;  Surgeon: Waynetta Sandy, MD;  Location: [place];  Service: Vascular;  Laterality: Right;   CARDIOVASCULAR STRESS TEST  07/06/2011   Evidence of mild ischemia in Basal Inferior and Mid Inferior regions, no significant wall motion abnormalities noted.   clamp to penis     for urinary control   COLONOSCOPY     COLONOSCOPY W/ BIOPSIES     multiple    CYSTOSCOPY W/ URETERAL STENT PLACEMENT Left 06/07/2015   Procedure: CYSTOSCOPY, RETROGRADE PYLEGRAM  WITH STENT PLACEMENT;  Surgeon: Bjorn Loser, MD;  Location: [facility];  Service: Urology;  Laterality: Left;   CYSTOSCOPY WITH RETROGRADE PYELOGRAM, URETEROSCOPY AND STENT PLACEMENT Left 07/28/2015   Procedure: CYSTOSCOPY, URETEROSCOPY, WITH BASKET STONE REMOVAL;  Surgeon: Raynelle Bring, MD;  Location: [facility];  Service: Urology;  Laterality: Left;   ENDARTERECTOMY FEMORAL Right 12/21/2020   Procedure: RIGHT FEMORAL ENDARTERECTOMY;  Surgeon: Waynetta Sandy, MD;  Location: [place];  Service: Vascular;  Laterality: Right;   EYE SURGERY  Right 08/2014   Cataract; removed bilat   FEMORAL-POPLITEAL BYPASS GRAFT  07/19/2011   Procedure: BYPASS GRAFT FEMORAL-POPLITEAL ARTERY;  Surgeon: Mal Misty, MD;  Location: [place];  Service: Vascular;  Laterality: Right;  Right Femoral - Popliteal  Bypss with saphenous vein   FEMORAL-POPLITEAL BYPASS GRAFT Right 12/21/2020   Procedure: RIGHT FEMORAL-POPLITEAL ARTERY BYPASS GRAFT WITH PTFE;  Surgeon: Waynetta Sandy, MD;  Location: [facility] OR;  Service: Vascular;  Laterality: Right;   HERNIA REPAIR     right side,lft   INTRAOPERATIVE ARTERIOGRAM  07/19/2011   Procedure: INTRA OPERATIVE  ARTERIOGRAM;  Surgeon: Mal Misty, MD;  Location: [place];  Service: Vascular;  Laterality: Right;   NM MYOVIEW LTD  07/2011   Normal EF, very small area of basal-mid inferior ischemia; LOW RISK   PENILE PROSTHESIS  REMOVAL     PENILE PROSTHESIS IMPLANT     PR VEIN BYPASS GRAFT,AORTO-FEM-POP  07/19/2011   Right  Fem/Pop BPG   PROSTATE BIOPSY     prostatectomy retropubic radical  07/15/2002   with nerve sparing, Gleason 3+4=7   Reclast  07/25/2013   RENAL DUPLEX  07/01/2012   Left renal artery 60-99% diameter reduction   TRANSTHORACIC ECHOCARDIOGRAM  01/2017   Normal EF and overall function   Patient Active Problem List   Diagnosis Date Noted   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   Bilateral low back pain without sciatica 01/19/2021   Cardiovascular symptoms 01/19/2021   Chronic kidney disease, stage 3a ([place]) 01/19/2021   Encounter for general adult medical examination with abnormal findings 01/19/2021   Gangrene of toe of right foot ([place]) 01/19/2021   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 ([place]) 01/19/2021   History of pneumonia 01/19/2021   Moderate persistent asthma with (acute) exacerbation 01/19/2021   Nephrosclerosis 01/19/2021   Osteoporosis 01/19/2021   Scoliosis 01/19/2021   Tobacco user 01/19/2021   Urinary incontinence 01/19/2021   Malnutrition of moderate degree 12/29/2020   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 ([place]) 12/28/2020   Ischemic ulcer of foot, right, with fat layer exposed ([place]) 12/19/2020   COPD (chronic obstructive pulmonary disease) ([place]) 12/19/2020   Lumbar radiculopathy 05/28/2020   Posterior vitreous detachment of left eye 08/13/2019   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 08/13/2019   Retinal layer separation 08/13/2019   Primary open angle glaucoma of both eyes, severe stage 08/13/2019   Rhinitis, chronic 03/12/2018   Degeneration of lumbar  intervertebral disc 08/28/2017   Lung field abnormal finding on examination 07/19/2017   COPD GOLD II   07/19/2017   Carotid occlusion, right 08/11/2015   Aneurysm, cerebral 08/11/2015   Hydronephrosis, left 06/06/2015   Kidney disease, chronic, stage II (GFR 60-89 ml/min) 06/06/2015   Atherosclerosis of aortic bifurcation and common iliac arteries ([place]) 03/03/2015   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   Dizziness 12/16/2013   Vertigo    Left renal artery stenosis - 70% by angiography; increased velocity on recent Dopplers (60-99%) 12/08/2012    Class: Diagnosis of   Hyperlipidemia LDL goal <70 12/08/2012   Personal history of colonic adenomas 11/15/2012   Pain in limb 02/13/2012   Peripheral vascular disease ([place]) 08/07/2011   Atherosclerosis of native artery of extremity with intermittent claudication ([place]) 06/13/2011   Prostate cancer ([place]) 02/16/2011   Essential hypertension 12/09/2010   PCP: Deland Pretty, MD   REFERRING PROVIDER: Georgina Snell,  Rebekah Chesterfield, MD   REFERRING DIAG: M54.16 (ICD-10-CM) - Lumbar radiculopathy   Rationale for Evaluation and Treatment Rehabilitation   THERAPY DIAG:  Other low back pain   Muscle weakness (generalized)   ONSET DATE: years,    SUBJECTIVE:                                                                                                                                                                                            SUBJECTIVE STATEMENT: 09/08/2021 Reports he left his cane in the car. State she has no pain but his hips feel stiff. No difficulties with exercises at home  Eval: States he has has back pain and hip pain for years. States he feels pretty good today. States he has benefits from injections but no history of surgery. Reports he can stand for about 10 minutes at a time. States he has been using a cane but he didn't feel like he needed it today as  he was feeling good. States he has a walker but he doesn't use it. Reports he does all kinds of stretches (hamstring stretches, IT stretch, SKC, LTR, clamshells which he learned in PT about a year ago. States he didn't feel the PT actually helped. States he walks but sometimes the pain keeps him from walking. States his left side feels worse and weak.   Pain radiates into B Thighs and feels weak, ESI, has back brace, B injections to hips last MD visit 08/24/21   PERTINENT HISTORY:  Spinal stenosis, scoliosis,, right vein replacement, inoperable brain aneurysm    PAIN:  Are you having pain? No: NPRS scale: 0/10 Pain location: B hips  Pain description: dull ache Aggravating factors: walking a certain way, bending over, standing Relieving factors: sitting, injections     PRECAUTIONS: None   WEIGHT BEARING RESTRICTIONS No   FALLS:  Has patient fallen in last 6 months? No   LIVING ENVIRONMENT: Lives with: lives with their family Lives in: House/apartment Stairs: No Has following equipment at home: Single point cane and Environmental consultant - 2 wheeled   OCCUPATION: retired, Research officer, trade union, baseball   PLOF: Independent with basic ADLs   PATIENT GOALS improve ability to walk, stand for 20-30 minutes at time     OBJECTIVE:    DIAGNOSTIC FINDINGS:  08/24/21 low back xray IMPRESSION: 1. Moderate severity levoscoliosis of the lumbar spine with marked severity multilevel degenerative changes.   08/24/21 pelvis xray IMPRESSION: Degenerative changes without evidence of an acute osseous abnormality.     SCREENING FOR RED FLAGS: Bowel or bladder incontinence: No Spinal tumors: No Cauda equina syndrome: No Compression fracture: No Abdominal  aneurysm: No   COGNITION:           Overall cognitive status: Within functional limits for tasks assessed                   POSTURE:  slumped  and leans to the right side, walking - B trendelenburg and wide BOS, unstable with increased frontal plane  motion   PALPATION: Tenderness in palpation in B glutes, piriformis, lumbar paraspinals and QL. Increased resting tone in left lumbar musculature, atrophy in left leg compared to right   LUMBAR ROM:    Active  A/PROM  eval  Flexion 50% limited and increased pain in back and legs  Extension 75% limited and increased pain in back and legs  Right lateral flexion 50% limited and increased pain in back and legs  Left lateral flexion 50% limited and increased pain in back and legs  Right rotation    Left rotation     (Blank rows = not tested)                LE Measurements       Lower Extremity Right 08/29/2021 Left 08/29/2021    A/PROM MMT A/PROM MMT  Hip Flexion   4   4-  Hip Extension          Hip Abduction          Hip Adduction          Hip Internal rotation          Hip External rotation          Knee Flexion   4-   2  Knee Extension   4   4-  Ankle Dorsiflexion   4   3+  Ankle Plantarflexion          Ankle Inversion          Ankle Eversion           (Blank rows = not tested)            * pain   LUMBAR SPECIAL TESTS:  + ely's test B       TODAY'S TREATMENT  09/08/2021 Therapeutic Exercise: Bike 5 minutes L focus L2     Supine: , Prone:    Seated: hamstring isometrics into box 5" holds 3x10 B, LAQs 3x10 5" holds B, lumbar flexion stretch x15 5" holds     Standing: steps 4" 2 hand support 3x12 B  Neuromuscular Re-education: Manual Therapy: Therapeutic Activity: Self Care: Trigger Point Dry Needling:  Modalities:      PATIENT EDUCATION:  Education details: on HEP Person educated: Patient Education method: Explanation, Demonstration, and Handouts Education comprehension: verbalized understanding   HOME EXERCISE PROGRAM: [id number]   ASSESSMENT:   CLINICAL IMPRESSION: 09/08/2021 Continued to progress exercises as tolerated. Fatigue in legs and mild discomfort in low back after steps but this resolved with lumbar flexion stretch. Reviewed HEP and  discussed importance of continued PT and focus on isolated strengthening.   Eval: Patient is a 81 y.o. male who was seen today for physical therapy evaluation and treatment for B hip and back pain. Patient with weakness L>R, ROM deficits and pain that is limiting overall function and QOL. Patient would benefit from skilled PT to improve overall function.     OBJECTIVE IMPAIRMENTS Abnormal gait, decreased activity tolerance, decreased balance, decreased mobility, difficulty walking, decreased ROM, decreased strength, postural dysfunction, and pain.    ACTIVITY LIMITATIONS lifting, standing, stairs, transfers, bed  mobility, and locomotion level   PARTICIPATION LIMITATIONS: meal prep, cleaning, and community activity   PERSONAL FACTORS Age and 1-2 comorbidities: spinal stenosis, atrophy left leg  are also affecting patient's functional outcome.    REHAB POTENTIAL: Good   CLINICAL DECISION MAKING: Evolving/moderate complexity   EVALUATION COMPLEXITY: Moderate   GOALS: Goals reviewed with patient?  yes   SHORT TERM GOALS:   Patient will be independent in self management strategies to improve quality of life and functional outcomes. Baseline: new program Target date: 09/26/2021 Goal status: INITIAL   2.  Patient will report at least 50% improvement in overall symptoms and/or function to demonstrate improved functional mobility Baseline: 0% Target date: 09/26/2021 Goal status: INITIAL   3.  Patient will be able to perform 5 prone hamstring curls on left LE without assist to demonstrate improved LE strength Baseline: unable Target date: 09/26/2021 Goal status: INITIAL         LONG TERM GOALS:   Patient will report at least 75% improvement in overall symptoms and/or function to demonstrate improved functional mobility Baseline: 0% Target date: 10/24/2021 Goal status: INITIAL   2.  Patient will report being able to stand for at least 30 minutes at a time to improve standing  endurance  Baseline: 15 minutes max Target date: 10/24/2021 Goal status: INITIAL   3.  Patient will be able to demonstrate painfree lumbar ROM to improve QOL. Baseline:  Target date: 10/24/2021 Goal status: INITIAL           PLAN: PT FREQUENCY: 2x/week   PT DURATION: 8 weeks   PLANNED INTERVENTIONS: Therapeutic exercises, Therapeutic activity, Neuromuscular re-education, Balance training, Gait training, Patient/Family education, Self Care, Joint mobilization, Joint manipulation, Stair training, Orthotic/Fit training, DME instructions, Aquatic Therapy, Dry Needling, Electrical stimulation, Cryotherapy, Moist heat, Ionotophoresis '4mg'$ /ml Dexamethasone, and Manual therapy.   PLAN FOR NEXT SESSION: hamstring strengthening, prone lying, LE strengthening, gait training, balance    1:10 PM, 09/08/21 Jerene Pitch, DPT Physical Therapy with [facility]

## 2021-09-20 DIAGNOSIS — C61 Malignant neoplasm of prostate: Secondary | ICD-10-CM | POA: Diagnosis not present

## 2021-09-20 NOTE — Progress Notes (Unsigned)
   I, Peterson Lombard, LAT, ATC acting as a scribe for [name], MD.  Lance Dillon is a 81 y.o. male who presents to [facility] [facility] today for f/u chronic LBP and bilat hip pain. Pt was last seen by Dr. Georgina Snell on 08/24/21 and was given bilat GT steroid injections and we obtained medical records for [facility] and pt was referred to PT, completing 4 visits. Today, pt reports  Dx imaging: 08/24/21 L-spine & pelvis XR  Pertinent review of systems: ***  Relevant historical information: ***   Exam:  There were no vitals taken for this visit. General: Well Developed, well nourished, and in no acute distress.   MSK: ***    Lab and Radiology Results No results found for this or any previous visit (from the past 72 hour(s)). No results found.     Assessment and Plan: 81 y.o. male with ***   PDMP not reviewed this encounter. No orders of the defined types were placed in this encounter.  No orders of the defined types were placed in this encounter.    Discussed warning signs or symptoms. Please see discharge instructions. Patient expresses understanding.   ***

## 2021-09-21 ENCOUNTER — Ambulatory Visit: Payer: Medicare HMO | Admitting: Physical Therapy

## 2021-09-21 ENCOUNTER — Ambulatory Visit (INDEPENDENT_AMBULATORY_CARE_PROVIDER_SITE_OTHER): Payer: Medicare HMO | Admitting: Family Medicine

## 2021-09-21 ENCOUNTER — Encounter: Payer: Self-pay | Admitting: Physical Therapy

## 2021-09-21 VITALS — BP 124/76 | HR 64 | Ht 67.0 in | Wt 165.0 lb

## 2021-09-21 DIAGNOSIS — M47816 Spondylosis without myelopathy or radiculopathy, lumbar region: Secondary | ICD-10-CM | POA: Diagnosis not present

## 2021-09-21 DIAGNOSIS — M5459 Other low back pain: Secondary | ICD-10-CM

## 2021-09-21 DIAGNOSIS — M6281 Muscle weakness (generalized): Secondary | ICD-10-CM

## 2021-09-21 DIAGNOSIS — G8929 Other chronic pain: Secondary | ICD-10-CM

## 2021-09-21 DIAGNOSIS — M545 Low back pain, unspecified: Secondary | ICD-10-CM

## 2021-09-21 NOTE — Patient Instructions (Addendum)
Thank you for coming in today.   You should hear from MRI scheduling within 1 week. If you do not hear please let me know.    After the MRI I anticipate that we will be doing injections of the facet joints.

## 2021-09-21 NOTE — Therapy (Addendum)
OUTPATIENT PHYSICAL THERAPY TREATMENT NOTE PHYSICAL THERAPY DISCHARGE SUMMARY  Visits from Start of Care: 5  Current functional level related to goals / functional outcomes: Unable to assess due to unplanned discharge    Remaining deficits: Unable to assess due to unplanned discharge    Education / Equipment: Unable to assess due to unplanned discharge    Patient agrees to discharge. Patient goals were not met. Patient is being discharged due to not returning since the last visit.  3:55 PM, 02/23/22 Jerene Pitch, DPT Physical Therapy with [facility]    Patient Name: WILLIAM SCHAKE MRN: [id number] DOB:[birth year], 81 y.o., male Today's Date: 09/21/2021    END OF SESSION:   PT End of Session - 09/21/21 1349     Visit Number 5    Number of Visits 16    Date for PT Re-Evaluation 10/24/21    Authorization Type [employer]    Progress Note Due on Visit 10    PT Start Time 1349    PT Stop Time 7253    PT Time Calculation (min) 39 min    Activity Tolerance Patient tolerated treatment well    Behavior During Therapy WFL for tasks assessed/performed             Past Medical History:  Diagnosis Date   Anemia    Arthritis    Atherosclerosis of aortic bifurcation and common iliac arteries ([place]) 03/2015   Noted on abdominal/renal Dopplers   Blind right eye    Carotid artery, internal, 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   Bilateral: Noted on MRA of brain in November 2015 with collateral flow from posterior circulation and external collaterals reconstituting anterior circulation   Chronic kidney disease    Colon polyps 2004, 07, 14   COPD (chronic obstructive pulmonary disease) (HCC)    GERD (gastroesophageal reflux disease)    diet controlled, no meds   Glaucoma    left eye    Hx of radiation therapy 03/22/11 to 05/08/11   PSA recurrent carcinoma of prostate   Hypercholesterolemia    Hypertension    Macular degeneration of both eyes    Peripheral vascular disease ([place])  07/2011   Bilateral SFA stenosis: Status post right femoropopliteal bypass - Dr. 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 mm x 8 mm fusiform aneurysm    Prostate cancer ([place]) 2004   Renal artery stenosis (HCC)    70% left   Stones in the urinary tract    Stroke [facility])    Urinary incontinence    Past Surgical History:  Procedure Laterality Date   ABDOMINAL AORTAGRAM N/A 06/20/2011   Procedure: ABDOMINAL Maxcine Ham;  Surgeon: Serafina Mitchell, MD;  Location: [facility] CATH LAB;  Service: Cardiovascular;  Laterality: N/A;   ABDOMINAL AORTOGRAM W/LOWER EXTREMITY N/A 12/20/2020   Procedure: ABDOMINAL AORTOGRAM W/LOWER EXTREMITY;  Surgeon: Waynetta Sandy, MD;  Location: [place] CV LAB;  Service: Cardiovascular;  Laterality: N/A;   AMPUTATION Right 12/28/2020   Procedure: RIGHT 4TH TOE AMPUTATION;  Surgeon: Waynetta Sandy, MD;  Location: [place];  Service: Vascular;  Laterality: Right;   CARDIOVASCULAR STRESS TEST  07/06/2011   Evidence of mild ischemia in Basal Inferior and Mid Inferior regions, no significant wall motion abnormalities noted.   clamp to penis     for urinary control   COLONOSCOPY     COLONOSCOPY W/ BIOPSIES     multiple    CYSTOSCOPY W/ URETERAL STENT PLACEMENT Left 06/07/2015   Procedure: CYSTOSCOPY,  RETROGRADE PYLEGRAM  WITH STENT PLACEMENT;  Surgeon: Bjorn Loser, MD;  Location: [facility];  Service: Urology;  Laterality: Left;   CYSTOSCOPY WITH RETROGRADE PYELOGRAM, URETEROSCOPY AND STENT PLACEMENT Left 07/28/2015   Procedure: CYSTOSCOPY, URETEROSCOPY, WITH BASKET STONE REMOVAL;  Surgeon: Raynelle Bring, MD;  Location: [facility];  Service: Urology;  Laterality: Left;   ENDARTERECTOMY FEMORAL Right 12/21/2020   Procedure: RIGHT FEMORAL ENDARTERECTOMY;  Surgeon: Waynetta Sandy, MD;  Location: [place];  Service: Vascular;  Laterality: Right;   EYE SURGERY Right 08/2014   Cataract; removed bilat   FEMORAL-POPLITEAL BYPASS  GRAFT  07/19/2011   Procedure: BYPASS GRAFT FEMORAL-POPLITEAL ARTERY;  Surgeon: Mal Misty, MD;  Location: [place];  Service: Vascular;  Laterality: Right;  Right Femoral - Popliteal  Bypss with saphenous vein   FEMORAL-POPLITEAL BYPASS GRAFT Right 12/21/2020   Procedure: RIGHT FEMORAL-POPLITEAL ARTERY BYPASS GRAFT WITH PTFE;  Surgeon: Waynetta Sandy, MD;  Location: [place];  Service: Vascular;  Laterality: Right;   HERNIA REPAIR     right side,lft   INTRAOPERATIVE ARTERIOGRAM  07/19/2011   Procedure: INTRA OPERATIVE ARTERIOGRAM;  Surgeon: Mal Misty, MD;  Location: [place];  Service: Vascular;  Laterality: Right;   NM MYOVIEW LTD  07/2011   Normal EF, very small area of basal-mid inferior ischemia; LOW RISK   PENILE PROSTHESIS  REMOVAL     PENILE PROSTHESIS IMPLANT     PR VEIN BYPASS GRAFT,AORTO-FEM-POP  07/19/2011   Right  Fem/Pop BPG   PROSTATE BIOPSY     prostatectomy retropubic radical  07/15/2002   with nerve sparing, Gleason 3+4=7   Reclast  07/25/2013   RENAL DUPLEX  07/01/2012   Left renal artery 60-99% diameter reduction   TRANSTHORACIC ECHOCARDIOGRAM  01/2017   Normal EF and overall function   Patient Active Problem List   Diagnosis Date Noted   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   Bilateral low back pain without sciatica 01/19/2021   Cardiovascular symptoms 01/19/2021   Chronic kidney disease, stage 3a ([place]) 01/19/2021   Encounter for general adult medical examination with abnormal findings 01/19/2021   Gangrene of toe of right foot ([place]) 01/19/2021   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 ([place]) 01/19/2021   History of pneumonia 01/19/2021   Moderate persistent asthma with (acute) exacerbation 01/19/2021   Nephrosclerosis 01/19/2021   Osteoporosis 01/19/2021   Scoliosis 01/19/2021   Tobacco user 01/19/2021   Urinary incontinence 01/19/2021   Malnutrition of moderate degree 12/29/2020   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 ([place]) 12/28/2020   Ischemic ulcer of foot, right, with fat layer exposed ([place]) 12/19/2020   COPD (chronic obstructive pulmonary disease) ([place]) 12/19/2020   Lumbar radiculopathy 05/28/2020   Posterior vitreous detachment of left eye 08/13/2019   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 08/13/2019   Retinal layer separation 08/13/2019   Primary open angle glaucoma of both eyes, severe stage 08/13/2019   Rhinitis, chronic 03/12/2018   Degeneration of lumbar intervertebral disc 08/28/2017   Lung field abnormal finding on examination 07/19/2017   COPD GOLD II   07/19/2017   Carotid occlusion, right 08/11/2015   Aneurysm, cerebral 08/11/2015   Hydronephrosis, left 06/06/2015   Kidney disease, chronic, stage II (GFR 60-89 ml/min) 06/06/2015   Atherosclerosis of aortic bifurcation and common iliac arteries ([place]) 03/03/2015   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   Dizziness 12/16/2013  Vertigo    Left renal artery stenosis - 70% by angiography; increased velocity on recent Dopplers (60-99%) 12/08/2012    Class: Diagnosis of   Hyperlipidemia LDL goal <70 12/08/2012   Personal history of colonic adenomas 11/15/2012   Pain in limb 02/13/2012   Peripheral vascular disease ([place]) 08/07/2011   Atherosclerosis of native artery of extremity with intermittent claudication ([place]) 06/13/2011   Prostate cancer ([place]) 02/16/2011   Essential hypertension 12/09/2010   PCP: Deland Pretty, MD   REFERRING PROVIDER: Gregor Hams, MD   REFERRING DIAG: M54.16 (ICD-10-CM) - Lumbar radiculopathy   Rationale for Evaluation and Treatment Rehabilitation   THERAPY DIAG:  Other low back pain   Muscle weakness (generalized)   ONSET DATE: years,    SUBJECTIVE:                                                                                                                                                                                             SUBJECTIVE STATEMENT: 09/21/2021 States that his right calf is sore and he has had to back off on the foot exercises  Eval: States he has has back pain and hip pain for years. States he feels pretty good today. States he has benefits from injections but no history of surgery. Reports he can stand for about 10 minutes at a time. States he has been using a cane but he didn't feel like he needed it today as he was feeling good. States he has a walker but he doesn't use it. Reports he does all kinds of stretches (hamstring stretches, IT stretch, SKC, LTR, clamshells which he learned in PT about a year ago. States he didn't feel the PT actually helped. States he walks but sometimes the pain keeps him from walking. States his left side feels worse and weak.   Pain radiates into B Thighs and feels weak, ESI, has back brace, B injections to hips last MD visit 08/24/21   PERTINENT HISTORY:  Spinal stenosis, scoliosis,, right vein replacement, inoperable brain aneurysm    PAIN:  Are you having pain? yes: NPRS scale: 4/10 Pain location: B hips  Pain description: dull ache Aggravating factors: walking a certain way, bending over, standing Relieving factors: sitting, injections     PRECAUTIONS: None   WEIGHT BEARING RESTRICTIONS No   FALLS:  Has patient fallen in last 6 months? No   LIVING ENVIRONMENT: Lives with: lives with their family Lives in: House/apartment Stairs: No Has following equipment at home: Single point cane and Environmental consultant - 2 wheeled   OCCUPATION: retired, Research officer, trade union, baseball   PLOF: Independent with basic ADLs  PATIENT GOALS improve ability to walk, stand for 20-30 minutes at time     OBJECTIVE:    DIAGNOSTIC FINDINGS:  08/24/21 low back xray IMPRESSION: 1. Moderate severity levoscoliosis of the lumbar spine with marked severity multilevel degenerative changes.   08/24/21 pelvis  xray IMPRESSION: Degenerative changes without evidence of an acute osseous abnormality.     SCREENING FOR RED FLAGS: Bowel or bladder incontinence: No Spinal tumors: No Cauda equina syndrome: No Compression fracture: No Abdominal aneurysm: No   COGNITION:           Overall cognitive status: Within functional limits for tasks assessed                   POSTURE:  slumped  and leans to the right side, walking - B trendelenburg and wide BOS, unstable with increased frontal plane motion   PALPATION: Tenderness in palpation in B glutes, piriformis, lumbar paraspinals and QL. Increased resting tone in left lumbar musculature, atrophy in left leg compared to right   LUMBAR ROM:    Active  A/PROM  eval  Flexion 50% limited and increased pain in back and legs  Extension 75% limited and increased pain in back and legs  Right lateral flexion 50% limited and increased pain in back and legs  Left lateral flexion 50% limited and increased pain in back and legs  Right rotation    Left rotation     (Blank rows = not tested)                LE Measurements       Lower Extremity Right 08/29/2021 Left 08/29/2021    A/PROM MMT A/PROM MMT  Hip Flexion   4   4-  Hip Extension          Hip Abduction          Hip Adduction          Hip Internal rotation          Hip External rotation          Knee Flexion   4-   2  Knee Extension   4   4-  Ankle Dorsiflexion   4   3+  Ankle Plantarflexion          Ankle Inversion          Ankle Eversion           (Blank rows = not tested)            * pain   LUMBAR SPECIAL TESTS:  + ely's test B       TODAY'S TREATMENT  09/21/2021 Therapeutic Exercise: Bike 5 minutes L focus L1-2     Supine:  Prone:    Seated: hamstring isometrics into box 5" holds 3x10 B, LAQs 3x10 5" holds B, lumbar flexion stretch 5x10 5" holds - performed in between standing exercises.      Standing: marching with hands on wall 4x3 B CGA, posture holds with back up  against the wall 5" holds x20, backwards walking with walker x5 laps B 40 feet, Narrow BOS with head turns and CGA 4x5 5" holds B Neuromuscular Re-education: Manual Therapy: Therapeutic Activity: Self Care: Trigger Point Dry Needling:  Modalities:      PATIENT EDUCATION:  Education details: on HEP, on importance of adherence to HEP Person educated: Patient Education method: Explanation, Demonstration, and Handouts Education comprehension: verbalized understanding   HOME EXERCISE PROGRAM: [id number]   ASSESSMENT:  CLINICAL IMPRESSION: 8/23/2023Session focused on education and progression of exercises. Discussed weakness and balance issues that would not be fixed with injections and importance of continued skilled PT. Patient agreed and will continue with current POC as tolerated. Weakness in back and legs noted end of session   Eval: Patient is a 81 y.o. male who was seen today for physical therapy evaluation and treatment for B hip and back pain. Patient with weakness L>R, ROM deficits and pain that is limiting overall function and QOL. Patient would benefit from skilled PT to improve overall function.     OBJECTIVE IMPAIRMENTS Abnormal gait, decreased activity tolerance, decreased balance, decreased mobility, difficulty walking, decreased ROM, decreased strength, postural dysfunction, and pain.    ACTIVITY LIMITATIONS lifting, standing, stairs, transfers, bed mobility, and locomotion level   PARTICIPATION LIMITATIONS: meal prep, cleaning, and community activity   PERSONAL FACTORS Age and 1-2 comorbidities: spinal stenosis, atrophy left leg  are also affecting patient's functional outcome.    REHAB POTENTIAL: Good   CLINICAL DECISION MAKING: Evolving/moderate complexity   EVALUATION COMPLEXITY: Moderate   GOALS: Goals reviewed with patient?  yes   SHORT TERM GOALS:   Patient will be independent in self management strategies to improve quality of life and functional  outcomes. Baseline: new program Target date: 09/26/2021 Goal status: INITIAL   2.  Patient will report at least 50% improvement in overall symptoms and/or function to demonstrate improved functional mobility Baseline: 0% Target date: 09/26/2021 Goal status: INITIAL   3.  Patient will be able to perform 5 prone hamstring curls on left LE without assist to demonstrate improved LE strength Baseline: unable Target date: 09/26/2021 Goal status: INITIAL         LONG TERM GOALS:   Patient will report at least 75% improvement in overall symptoms and/or function to demonstrate improved functional mobility Baseline: 0% Target date: 10/24/2021 Goal status: INITIAL   2.  Patient will report being able to stand for at least 30 minutes at a time to improve standing endurance  Baseline: 15 minutes max Target date: 10/24/2021 Goal status: INITIAL   3.  Patient will be able to demonstrate painfree lumbar ROM to improve QOL. Baseline:  Target date: 10/24/2021 Goal status: INITIAL           PLAN: PT FREQUENCY: 2x/week   PT DURATION: 8 weeks   PLANNED INTERVENTIONS: Therapeutic exercises, Therapeutic activity, Neuromuscular re-education, Balance training, Gait training, Patient/Family education, Self Care, Joint mobilization, Joint manipulation, Stair training, Orthotic/Fit training, DME instructions, Aquatic Therapy, Dry Needling, Electrical stimulation, Cryotherapy, Moist heat, Ionotophoresis '4mg'$ /ml Dexamethasone, and Manual therapy.   PLAN FOR NEXT SESSION: hamstring strengthening, prone lying, LE strengthening, gait training, balance    2:32 PM, 09/21/21 Jerene Pitch, DPT Physical Therapy with [facility]

## 2021-09-27 ENCOUNTER — Ambulatory Visit
Admission: RE | Admit: 2021-09-27 | Discharge: 2021-09-27 | Disposition: A | Discharge status: Home / Self Care | Payer: Medicare HMO | Source: Ambulatory Visit | Attending: Family Medicine | Admitting: Family Medicine

## 2021-09-27 DIAGNOSIS — M545 Low back pain, unspecified: Secondary | ICD-10-CM | POA: Diagnosis not present

## 2021-09-27 DIAGNOSIS — M48061 Spinal stenosis, lumbar region without neurogenic claudication: Secondary | ICD-10-CM | POA: Diagnosis not present

## 2021-09-27 DIAGNOSIS — M47816 Spondylosis without myelopathy or radiculopathy, lumbar region: Secondary | ICD-10-CM

## 2021-09-29 ENCOUNTER — Telehealth: Payer: Self-pay | Admitting: Family Medicine

## 2021-09-29 DIAGNOSIS — M545 Low back pain, unspecified: Secondary | ICD-10-CM

## 2021-09-29 DIAGNOSIS — M47816 Spondylosis without myelopathy or radiculopathy, lumbar region: Secondary | ICD-10-CM

## 2021-09-29 NOTE — Progress Notes (Signed)
You have multiple abnormalities on your lumbar spine but the source of the back pain that you have is probably the facet arthritis.  The left L4-5 and L5-S1 facet joints are the worst.  You do have right-sided L4-L5 facet arthritis as well that could be causing pain.  Typically we can do 2 injections at a time and I have asked the radiologist to inject the left L4-L5 and L5-S1.  If your pain is not as controlled as we would like we can inject the right side next.  Please contact [facility] at [phone] to schedule the facet joint injections.  Recheck in my office about a week after the injections.  We will go over the MRI in full detail and talk about how you feel.

## 2021-09-29 NOTE — Telephone Encounter (Signed)
Facet injection ordered

## 2021-10-05 ENCOUNTER — Other Ambulatory Visit (HOSPITAL_COMMUNITY): Payer: Self-pay | Admitting: Interventional Radiology

## 2021-10-05 ENCOUNTER — Other Ambulatory Visit: Payer: Self-pay | Admitting: Family Medicine

## 2021-10-05 DIAGNOSIS — G8929 Other chronic pain: Secondary | ICD-10-CM

## 2021-10-05 DIAGNOSIS — I671 Cerebral aneurysm, nonruptured: Secondary | ICD-10-CM

## 2021-10-05 DIAGNOSIS — M47816 Spondylosis without myelopathy or radiculopathy, lumbar region: Secondary | ICD-10-CM

## 2021-10-07 ENCOUNTER — Encounter: Payer: Self-pay | Admitting: Cardiology

## 2021-10-07 ENCOUNTER — Ambulatory Visit: Payer: Medicare HMO | Admitting: Cardiology

## 2021-10-07 VITALS — BP 136/72 | HR 62 | Temp 98.5°F | Resp 16 | Ht 67.0 in | Wt 165.4 lb

## 2021-10-07 DIAGNOSIS — E785 Hyperlipidemia, unspecified: Secondary | ICD-10-CM | POA: Diagnosis not present

## 2021-10-07 DIAGNOSIS — I739 Peripheral vascular disease, unspecified: Secondary | ICD-10-CM | POA: Diagnosis not present

## 2021-10-07 DIAGNOSIS — I70213 Atherosclerosis of native arteries of extremities with intermittent claudication, bilateral legs: Secondary | ICD-10-CM | POA: Diagnosis not present

## 2021-10-07 DIAGNOSIS — I77810 Thoracic aortic ectasia: Secondary | ICD-10-CM

## 2021-10-07 DIAGNOSIS — I10 Essential (primary) hypertension: Secondary | ICD-10-CM

## 2021-10-07 DIAGNOSIS — H52203 Unspecified astigmatism, bilateral: Secondary | ICD-10-CM | POA: Diagnosis not present

## 2021-10-07 DIAGNOSIS — H401121 Primary open-angle glaucoma, left eye, mild stage: Secondary | ICD-10-CM | POA: Diagnosis not present

## 2021-10-07 NOTE — Progress Notes (Signed)
Primary Physician/Referring:  Deland Pretty, MD  Patient ID: Lance Dillon, male    DOB: [birth year], 81 y.o.   MRN: [id number]  Chief Complaint  Patient presents with   Results    CT   HPI:    Lance Dillon  is a 81 y.o. 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   Past medical history significant for hypertension, hyperlipidemia, chronic stage IIIa creatinine disease, chronic anemia and remote tobacco use disorder with a 90-pack-year history quit in 2004.  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  He denies chest pain, dyspnea.  States that he still is working part-time and works on Personal assistant.His symptoms of claudication has improved significantly on the right leg post bypass and also left lower extremity as well.  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   Bilateral: Noted on MRA of brain in November 2015 with collateral flow from posterior circulation and external collaterals reconstituting anterior circulation   Chronic kidney disease    Colon polyps 2004, 07, 14   COPD (chronic obstructive pulmonary disease) (HCC)    GERD (gastroesophageal reflux disease)    diet controlled, no meds   Glaucoma    left eye    Hx  of radiation therapy 03/22/11 to 05/08/11   PSA recurrent carcinoma of prostate   Hypercholesterolemia    Hypertension    Macular degeneration of both eyes    Peripheral vascular disease ([place]) 07/2011   Bilateral SFA stenosis: Status post right femoropopliteal bypass - Dr. 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 Negative for chest pain, dyspnea on exertion and leg swelling.   Objective  Blood pressure 136/72, pulse 62, temperature 98.5 F (36.9 C), temperature source Temporal, resp. rate 16, height 5' 7"  (1.702 m), weight 165 lb 6.4 oz (75 kg), SpO2 94 %. Body mass index is 25.91 kg/m.     10/07/2021   11:14 AM 09/21/2021    8:52 AM 08/24/2021    8:26 AM  Vitals with  BMI  Height 5' 7"  5' 7"  5' 7"   Weight 165 lbs 6 oz 165 lbs 166 lbs 6 oz  BMI 25.9 85.46 27.03  Systolic 500 938 182  Diastolic 72 76 74  Pulse 62 64 72    Physical Exam Neck:     Vascular: No carotid bruit or JVD.  Cardiovascular:     Rate and Rhythm: Normal rate and regular rhythm.     Pulses: Intact distal pulses.          Carotid pulses are 2+ on the right side and 2+ on the left side.      Femoral pulses are 2+ on the right side and 1+ on the left side.      Popliteal pulses are 2+ on the right side and 0 on the left side.       Dorsalis pedis pulses are 2+ on the right side and 0 on the left side.       Posterior tibial pulses are 0 on  the right side and 0 on the left side.     Heart sounds: Normal heart sounds. No murmur heard.    No gallop.  Pulmonary:     Effort: Pulmonary effort is normal.     Breath sounds: Normal breath sounds.  Abdominal:     General: Bowel sounds are normal.     Palpations: Abdomen is soft.  Musculoskeletal:     Right lower leg: No edema.     Left lower leg: No edema.     Medications and allergies   Allergies  Allergen Reactions   Avelox [Moxifloxacin Hcl In Nacl] Diarrhea   Moxifloxacin Diarrhea   Cilostazol Diarrhea   Cyclobenzaprine Other (See Comments)   Quinolones     Other reaction(s): Unknown   Ramipril     Other reaction(s): Unknown     Medication list after today's encounter    Current Outpatient Medications:    acetaminophen (TYLENOL) 500 MG tablet, Take 500 mg by mouth every 6 (six) hours as needed for mild pain or headache., Disp: , Rfl:    Calcium Carbonate-Vitamin D (CALTRATE 600+D PO), Take 1 tablet by mouth daily. , Disp: , Rfl:    clopidogrel (PLAVIX) 75 MG tablet, Take 1 tablet (75 mg total) by mouth daily., Disp: 90 tablet, Rfl: 3   docusate sodium (COLACE) 100 MG capsule, Take 1 capsule (100 mg total) by mouth daily as needed for mild constipation., Disp: , Rfl: 0   fluticasone (FLONASE) 50 MCG/ACT nasal spray, Place 2 sprays into both nostrils daily as needed for rhinitis or allergies (or nasal congestion)., Disp: , Rfl:    latanoprost (XALATAN) 0.005 % ophthalmic solution, Place 1 drop into the left eye at bedtime. , Disp: , Rfl:    losartan-hydrochlorothiazide (HYZAAR) 100-12.5 MG tablet, Take 1 tablet by mouth daily., Disp: , Rfl:    LUTEIN PO, Take 1 capsule by mouth daily., Disp: , Rfl:    metoprolol succinate (TOPROL-XL) 25 MG 24 hr tablet, TAKE 1 TABLET (25 MG TOTAL) BY MOUTH DAILY. ** DO NOT CRUSH ** (BETA BLOCKER), Disp: 90 tablet, Rfl: 3   Multiple Vitamin (MULTIVITAMIN WITH MINERALS) TABS tablet, Take 1 tablet by mouth daily., Disp: 30 tablet, Rfl:  0   rosuvastatin (CRESTOR) 10 MG tablet, TAKE 1 TABLET BY MOUTH EVERY DAY (Patient taking differently: Take 10 mg by mouth at bedtime.), Disp: 90 tablet, Rfl: 3   vitamin B-12 (CYANOCOBALAMIN) 500 MCG tablet, Take 1 tablet (500 mcg total) by  mouth daily., Disp: 30 tablet, Rfl: 0   amLODipine (NORVASC) 2.5 MG tablet, Take 1 tablet (2.5 mg total) by mouth at bedtime., Disp: 90 tablet, Rfl: 2  Laboratory examination:   Recent Labs    12/23/20 0112 12/24/20 0206 12/28/20 0751 12/29/20 0135  NA 134* 133* 136 133*  K 3.9 4.2 4.5 3.8  CL 106 102 102 101  CO2 23 24  --  27  GLUCOSE 119* 109* 114* 113*  BUN 19 17 31* 27*  CREATININE 1.17 1.26* 1.40* 1.36*  CALCIUM 7.8* 7.8*  --  8.1*  GFRNONAA >60 58*  --  53*   CrCl cannot be calculated (Patient's most recent lab result is older than the maximum 21 days allowed.).     Latest Ref Rng & Units 12/29/2020    1:35 AM 12/28/2020    7:51 AM 12/24/2020    2:06 AM  CMP  Glucose 70 - 99 mg/dL 113  114  109   BUN 8 - 23 mg/dL 27  31  17    Creatinine 0.61 - 1.24 mg/dL 1.36  1.40  1.26   Sodium 135 - 145 mmol/L 133  136  133   Potassium 3.5 - 5.1 mmol/L 3.8  4.5  4.2   Chloride 98 - 111 mmol/L 101  102  102   CO2 22 - 32 mmol/L 27   24   Calcium 8.9 - 10.3 mg/dL 8.1   7.8       Latest Ref Rng & Units 12/30/2020    1:12 AM 12/29/2020    1:35 AM 12/28/2020    7:51 AM  CBC  WBC 4.0 - 10.5 K/uL 4.3  4.1    Hemoglobin 13.0 - 17.0 g/dL 8.4  7.7  9.5   Hematocrit 39.0 - 52.0 % 25.9  23.7  28.0   Platelets 150 - 400 K/uL 185  168      Lipid Panel Recent Labs    12/22/20 0126  CHOL 81  TRIG 52  LDLCALC 34  VLDL 10  HDL 37*  CHOLHDL 2.2   Labs 03/23/2021:  Lipoprotein (a) <75.0 nmol/L  16.6  (<75)  Apo A1 + B + Ratio     0.4   (0.00 to 0.7)  TSH No results for input(s): "TSH" in the last 8760 hours.  External labs:   Labs 05/12/2021:  Hb 13.9/HCT 39.8, platelets 161, normal indicis.  Vitamin D28.0.  A1c 5.6%.   Labs  01/06/2021:  Vitamin B12 855.  Sodium 140, potassium 4.5, serum glucose 97 mg, p.o. 26, creatinine 1.21, EGFR 56 mL, LFTs normal.  Iron studies normal.  Hb 10.2/HCT 32.1, platelets 299.  Labs 12/06/2020:  Total cholesterol 129, triglycerides 114, HDL 50, LDL 56.  Vitamin D 31.5, A1c 5.8%.  Hb 13.6/HCT 39.1, platelets 152.  Radiology:  MRI angio head 09/13/2020, comparison 08/26/2019:  1. Unchanged fusiform aneurysm involving the basilar tip and right P1 segment. 2. Unchanged appearance of bilateral ICA occlusion with intracranial reconstitution.  CT Chest 08/28/2021: 1. No imaging findings to suggest interstitial lung disease.  2. Diffuse bronchial wall thickening with moderate centrilobular and paraseptal emphysema; imaging findings suggestive of underlying COPD. 3. Extensive pleuroparenchymal thickening and architectural distortion in the lung apices, most compatible with areas of chronic post infectious or inflammatory scarring. 4. Aortic atherosclerosis, in addition to left main and three-vessel coronary artery disease. Large calcified atherosclerotic plaque at the ostium of the left subclavian artery, incompletely evaluated on today's noncontrast CT examination, but likely associated  with severe stenosis. 5. There are calcifications of the aortic valve and mitral annulus. Echocardiographic correlation for evaluation of potential valvular dysfunction may be warranted if clinically indicated. 6. Ectasia of ascending thoracic aorta (4.3 cm in diameter).  Cardiac Studies:   ABI 01/14/2021: Right: Resting right ankle-brachial index is within normal range. The  right toe-brachial index is abnormal.  Right ABI of 1.03 and TBI of 0.54. Left: Resting left ankle-brachial index indicates moderate left lower  extremity arterial disease. The left toe-brachial index is abnormal.  Left ABI 0.58, TBI absent.  Peripheral arteriogram 12/20/2020: Findings: Left common femoral artery appeared to  be occluded I was able to cannulate this just at the top prior to a collateral and get access.  Performed aortogram which demonstrated heavily calcified aorta and bilateral common and external iliac arteries.  I then crossed over to the common iliac artery on the right where I had collaterals to fill the right lower extremity.  I performed angiography was demonstrated occluded right common femoral with long segment occlusion of his SFA which was heavily calcified reconstitutes at the level of the knee where it is diseased below the knee is much less diseased appears to have runoff via the anterior tibial and peroneal arteries.  Plan will be for right common femoral endarterectomy and femoral to below-knee popliteal artery bypass which will likely need to be performed with graft given the vein has been harvested in the past for bypass.  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  Renal artery duplex  04/20/2021: Right: No evidence of right renal artery stenosis. RRV flow present.  Normal size right kidney.  Left:  No evidence of left renal artery stenosis. LRV flow present.  Low flow velocities detected in the left renal artery as seen on the prior exam in 2021. Kidney length (cm)11.14Kidney length (cm)9.20 (7.5 cm previously)  Carotid artery duplex 04/20/2021: Right Carotid: Evidence consistent with a total occlusion of the right ICA. Left Carotid: Velocities in the left ICA are consistent with a 1-39% stenosis. Vertebrals:  Bilateral vertebral arteries demonstrate antegrade flow. Subclavians: Right subclavian artery flow was disturbed.  Lower extremity arterial duplex 04/20/2021: Right ABI 1.00, left ABI 0.53. Right lower extremity: Patent right femoral-popliteal bypass graft with no evidence of stenosis.  Complete  PCV ECHOCARDIOGRAM COMPLETE 03/18/2021  Normal LV systolic function with EF 58%. Left ventricle cavity is normal in size. Mild concentric  remodeling of the left ventricle. Normal global wall motion. Indeterminate diastolic filling pattern. Calculated EF 58%. Trileaflet aortic valve. No evidence of aortic stenosis. Moderate (Grade III) aortic regurgitation. Moderate calcification of the aortic valve annulus. Mild aortic valve leaflet calcification. Mildly restricted aortic valve leaflets. No evidence of mitral stenosis. Mild (Grade I) mitral regurgitation. Moderate calcification of the mitral valve annulus. Moderate mitral valve leaflet calcification. Moderately restricted mitral valve leaflets. Structurally normal tricuspid valve.  Mild tricuspid regurgitation. No evidence of pulmonary hypertension. The aortic root is mildly dilated at 3.8 cm. Mildly dilated ascending aorta 4.0 cm.Marland Kitchen    PCV MYOCARDIAL PERFUSION WITH LEXISCAN 04/05/2021  Nondiagnostic ECG stress. The heart rate response was consistent with Lexiscan. There is mild motion artifact in the inferior wall. Otherwise myocardial perfusion is normal. Overall LV systolic function is normal without regional wall motion abnormalities. Stress LV EF: 79%. Small volume LV. No previous exam available for comparison. Low risk.   EKG:   EKG 03/16/2021: Sinus rhythm with first-degree block at rate of 74 bpm, left  atrial enlargement, incomplete right bundle branch block.  Otherwise normal EKG.    Assessment     ICD-10-CM   1. Atherosclerosis of native artery of both lower extremities with intermittent claudication (HCC)  I70.213 EKG 12-Lead    2. Aortic root dilatation (HCC)  I77.810     3. PAD (peripheral artery disease) (HCC)  F81.0 Basic metabolic panel    High sensitivity CRP    PCV ANKLE BRACHIAL INDEX (ABI)    4. Hyperlipidemia LDL goal <70  E78.5     5. Primary hypertension  F75 Basic metabolic panel    High sensitivity CRP      Medications Discontinued During This Encounter  Medication Reason   Turmeric (QC TUMERIC COMPLEX PO)     No orders of the defined types  were placed in this encounter.  Orders Placed This Encounter  Procedures   Basic metabolic panel   High sensitivity CRP   EKG 12-Lead   Recommendations:   VERLE WHEELING is a 81 y.o.  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   Past medical history significant for hypertension, hyperlipidemia, chronic stage IIIa creatinine disease, chronic anemia and remote tobacco use disorder with a 90-pack-year history quit in 2004.  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  Patient has had negative nuclear stress test on 04/05/2021.  He has low risk stress test and echocardiogram reveals normal LVEF.  He just needs secondary prevention with aggressive control of hypertension, hyperlipidemia, and continued exercise program.  He is presently on appropriate medical therapy.  His blood pressure is well controlled, he has mild aortic root dilatation which I will perform annual echocardiogram to follow-up and if necessary CT scan, he is presently on losartan HCT which should be continued.    Lipids are very well controlled.  Defer to PAD, continue Plavix indefinitely.  I will obtain ABI in 3 months.  Carotid artery disease is being followed by interventional radiology.  I would like to see him back in 3 months for follow-up.  Postop right leg bypass surgical anemia starting 11/2020 is now resolved, renal function needs follow-up, will obtain BMP, external  labs reviewed.    Adrian Prows, MD, [facility] 10/07/2021, 5:16 PM Office: [phone]

## 2021-10-08 LAB — BASIC METABOLIC PANEL
BUN/Creatinine Ratio: 22 (ref 10–24)
BUN: 28 mg/dL — ABNORMAL HIGH (ref 8–27)
CO2: 25 mmol/L (ref 20–29)
Calcium: 10.2 mg/dL (ref 8.6–10.2)
Chloride: 98 mmol/L (ref 96–106)
Creatinine, Ser: 1.25 mg/dL (ref 0.76–1.27)
Glucose: 85 mg/dL (ref 70–99)
Potassium: 4.3 mmol/L (ref 3.5–5.2)
Sodium: 141 mmol/L (ref 134–144)
eGFR: 58 mL/min/{1.73_m2} — ABNORMAL LOW (ref >59)

## 2021-10-08 LAB — HIGH SENSITIVITY CRP: CRP, High Sensitivity: 5.42 mg/L — ABNORMAL HIGH (ref 0.00–3.00)

## 2021-10-08 NOTE — Progress Notes (Signed)
Please let patient know that his kidney function has further improved to close to normal. His CRP is up and he would be a good candidate for Zeus trial

## 2021-10-11 ENCOUNTER — Encounter: Payer: Medicare HMO | Admitting: Physical Therapy

## 2021-10-11 NOTE — Discharge Instructions (Signed)

## 2021-10-12 ENCOUNTER — Other Ambulatory Visit: Payer: Medicare HMO

## 2021-10-12 ENCOUNTER — Ambulatory Visit
Admission: RE | Admit: 2021-10-12 | Discharge: 2021-10-12 | Disposition: A | Discharge status: Home / Self Care | Payer: Medicare HMO | Source: Ambulatory Visit | Attending: Family Medicine | Admitting: Family Medicine

## 2021-10-12 DIAGNOSIS — M545 Low back pain, unspecified: Secondary | ICD-10-CM

## 2021-10-12 DIAGNOSIS — M47816 Spondylosis without myelopathy or radiculopathy, lumbar region: Secondary | ICD-10-CM

## 2021-10-12 DIAGNOSIS — M4696 Unspecified inflammatory spondylopathy, lumbar region: Secondary | ICD-10-CM | POA: Diagnosis not present

## 2021-10-12 MED ORDER — METHYLPREDNISOLONE ACETATE 40 MG/ML INJ SUSP (RADIOLOG
80.0000 mg | Freq: Once | INTRAMUSCULAR | Status: AC
  Administered 2021-10-12: 80 mg via INTRA_ARTICULAR

## 2021-10-13 ENCOUNTER — Encounter: Payer: Medicare HMO | Admitting: Physical Therapy

## 2021-10-17 ENCOUNTER — Encounter: Payer: Medicare HMO | Admitting: Physical Therapy

## 2021-10-19 ENCOUNTER — Encounter: Payer: Medicare HMO | Admitting: Physical Therapy

## 2021-10-27 ENCOUNTER — Ambulatory Visit: Payer: Medicare HMO | Admitting: Family Medicine

## 2021-10-27 VITALS — BP 126/78 | HR 75 | Ht 67.0 in | Wt 166.0 lb

## 2021-10-27 DIAGNOSIS — Z936 Other artificial openings of urinary tract status: Secondary | ICD-10-CM | POA: Diagnosis not present

## 2021-10-27 DIAGNOSIS — G8929 Other chronic pain: Secondary | ICD-10-CM | POA: Diagnosis not present

## 2021-10-27 DIAGNOSIS — M810 Age-related osteoporosis without current pathological fracture: Secondary | ICD-10-CM | POA: Diagnosis not present

## 2021-10-27 DIAGNOSIS — M5416 Radiculopathy, lumbar region: Secondary | ICD-10-CM | POA: Diagnosis not present

## 2021-10-27 DIAGNOSIS — M47816 Spondylosis without myelopathy or radiculopathy, lumbar region: Secondary | ICD-10-CM

## 2021-10-27 DIAGNOSIS — M545 Low back pain, unspecified: Secondary | ICD-10-CM | POA: Diagnosis not present

## 2021-10-27 NOTE — Progress Notes (Signed)
I, Peterson Lombard, LAT, ATC acting as a scribe for [name], MD.  Lance Dillon is a 81 y.o. male who presents to [place] at [facility] today for f/u chronic low back pain and MRI review. Pt was last seen by Dr. Georgina Snell on 09/21/21 and he was advised to proceed to MRI. Based on MRI finding, facet injections were ordered and later performed on 10/12/21. Today, pt reports he can't tell much difference in his LBP.  When questioned he notes that his left low back pain has improved some but his right low back is still hurting.  He notes he has pain along the low back area on both sides right now worse than left.  Dx imaging: 09/27/21 L-spine MRI 08/24/21 L-spine & pelvis XR  Pertinent review of systems: No fevers or chills  Relevant historical information: Peripheral vascular disease.  Arthrosclerosis.  Lumbar scoliosis.   Exam:  BP 126/78   Pulse 75   Ht '5\' 7"'$  (1.702 m)   Wt 166 lb (75.3 kg)   SpO2 96%   BMI 26.00 kg/m  General: Well Developed, well nourished, and in no acute distress.   MSK: L-spine: Wearing lumbar brace.  Nontender midline.  Decreased lumbar motion.  Lower extremity strength diminished left hip abduction otherwise intact.    Lab and Radiology Results  EXAM: MRI LUMBAR SPINE WITHOUT CONTRAST   TECHNIQUE: Multiplanar, multisequence MR imaging of the lumbar spine was performed. No intravenous contrast was administered.   COMPARISON:  Lumbar radiographs 08/24/2021 and CT abdomen/pelvis 06/06/2015   FINDINGS: Segmentation: Standard; the lowest formed disc space is designated L5-S1.   Alignment: There is marked levocurvature centered at L3 with 3 mm right lateral listhesis of L2 on L3 and 7 mm left lateral listhesis of L4 on L5. There is no significant antero or retrolisthesis.   Vertebrae: Vertebral body heights are preserved. Background marrow signal is normal. There is no suspicious marrow signal abnormality. There is degenerative  endplate marrow signal abnormality without edema at L3-L4.   Conus medullaris and cauda equina: Conus extends to the L1 level. Conus and cauda equina appear normal.   Paraspinal and other soft tissues: There is atrophy of the left kidney. The paraspinal soft tissues are unremarkable.   Disc levels:   There is marked disc desiccation and narrowing eccentric to the right at L3-L4 due to the scoliotic curvature. There is more mild disc degeneration at the other levels. There is facet arthropathy most advanced on the right at L3-L4, bilaterally at L4-L5, and on the left at L5-S1 with associated effusions. There is no osseous irregularity or destruction to suggest septic arthritis.   T12-L1: No significant spinal canal or neural foraminal stenosis   L1-L2: There is a diffuse disc bulge resulting in mild spinal canal stenosis without significant neural foraminal stenosis.   L2-L3: There is a diffuse disc bulge eccentric to the left and mild bilateral facet arthropathy without significant spinal canal or neural foraminal stenosis   L3-L4: There is a diffuse disc bulge with a prominent right foraminal/extraforaminal protrusion and advanced right worse than left facet arthropathy with ligamentum flavum thickening resulting in right subarticular zone narrowing without evidence of nerve root impingement, and severe right and mild left neural foraminal stenosis with impingement of the exiting right L3 nerve root.   L4-L5: There is a diffuse disc bulge with a prominent right foraminal/extraforaminal protrusion, and advanced left worse than right facet arthropathy with effusions resulting in effacement of the  left subarticular zone with possible impingement of the traversing left L5 nerve root, narrowing of the right subarticular zone without evidence of nerve root impingement, and severe right and moderate left neural foraminal stenosis with impingement of the exiting right L4 nerve  root.   L5-S1: There is a disc bulge eccentric to the left with a left foraminal protrusion and advanced left facet arthropathy with an effusion resulting in severe left neural foraminal stenosis with impingement of the exiting L5 nerve root. No significant spinal canal or left neural foraminal stenosis.   IMPRESSION: 1. 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 2. Associated advanced disc degeneration eccentric to the right at L3-L4 with a right foraminal/extraforaminal protrusion and advanced right facet arthropathy resulting in severe right neural foraminal stenosis with impingement of the exiting L3 nerve root. 3. Disc bulge with a right foraminal protrusion and advanced left worse than right facet arthropathy at L4-L5 resulting in left subarticular zone effacement with possible impingement of the traversing left L5 nerve root, moderate left neural foraminal stenosis, and severe right neural foraminal stenosis with impingement of the exiting right L4 nerve root. 4. Advanced left facet arthropathy at L5-S1 with a left foraminal protrusion resulting in severe left neural foraminal stenosis with impingement of the exiting L5 nerve root. 5. Facet joint effusions most pronounced on the right at L4-L5 and on the left at L5-S1.     Electronically Signed   By: Valetta Mole M.D.   On: 09/28/2021 13:07   I, [name], personally (independently) visualized and performed the interpretation of the images attached in this note.  EXAM: Left L4-L5 facet steroid injection using fluoroscopic guidance   Left L5-S1 facet steroid injection using fluoroscopic guidance   PROCEDURE: The procedure, risks, benefits, and alternatives were explained to the patient. Questions regarding the procedure were encouraged and answered. The patient understands and consents to the procedure.   Left L4-L5 FACET INJECTION: A posterior oblique approach was taken to the facet on the left at L4-L5 using a 22 gauge  spinal needle. Intra-articular positioning was confirmed with anatomic landmarks. No vascular opacification is seen. 40 mg of Depo-Medrol mixed with 1 mL 1% lidocaine were instilled into the joint. The procedure was well-tolerated.   Left L5-S1 FACET INJECTION: A posterior oblique approach was taken to the facet on the left at L5-S1 using a 22 gauge spinal needle. Intra-articular positioning was confirmed with anatomic landmarks. No vascular opacification is seen. 40 mg of Depo-Medrol mixed with 1 mL 1% lidocaine were instilled into the joint. The procedure was well-tolerated.   IMPRESSION: Technically successful left L4-L5 and left L5-S1 facet steroid injections using fluoroscopic guidance.     Electronically Signed   By: Albin Felling M.D.   On: 10/12/2021 13:59      Assessment and Plan: 81 y.o. male with chronic low back pain thought to be multifactorial.  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  He had left L4-L5 and L5-S1 facet injections which may have helped a fair amount but is hard to tell truly with the overall back pain presentation that he does have.  We will proceed to right L4-L5 and L5-S1 facet injections next and hopefully he will get some benefit from this overall facet injection plan.  If this does not provide good pain relief we will proceed to epidural steroid injections probably interlaminar approach as neck step after that.  Went over the results of the MRI as well  as the imaging and plan with Lance Dillon and his wife today who expressed understanding agreement.  They will keep me updated with his progress.   PDMP not reviewed this encounter. Orders Placed This Encounter  Procedures   DG FACET JT INJ L /S SINGLE LEVEL RIGHT W/FL/CT    R L4-5 L5-S1 Symptoms Level & technique per radiology FACETS LUMB #2  RT L4-5 AND L5-S1 [employer] MCR  160 LBS  PACS (09/27/21)   *PLAVIX* NO NEEDS PT AWARE NO SHOW FEE     Standing Status:   Future    Standing Expiration Date:   11/26/2021    Order Specific Question:   Reason for Exam (SYMPTOM  OR DIAGNOSIS REQUIRED)    Answer:   low back pain    Order Specific Question:   Preferred Imaging Location?    Answer:   [id number]   DG FACET JT INJ L /S 2ND LEVEL RIGHT W/FL/CT    R L4-5 L5-S1 Symptoms Level & technique per radiology FACETS LUMB #2  RT L4-5 AND L5-S1 [employer] MCR  160 LBS  PACS (09/27/21)   *PLAVIX* NO NEEDS PT AWARE NO SHOW FEE    Standing Status:   Future    Standing Expiration Date:   11/26/2021    Order Specific Question:   Reason for Exam (SYMPTOM  OR DIAGNOSIS REQUIRED)    Answer:   low back pain    Order Specific Question:   Preferred Imaging Location?    Answer:   [id number]   No orders of the defined types were placed in this encounter.    Discussed warning signs or symptoms. Please see discharge instructions. Patient expresses understanding.   The above documentation has been reviewed and is accurate and complete [name], M.D. Total encounter time 30 minutes including face-to-face time with the patient and, reviewing past medical record, and charting on the date of service.

## 2021-10-27 NOTE — Patient Instructions (Addendum)
Thank you for coming in today.   Please call [facility] at [phone] to schedule your spine injection.   Check back as needed

## 2021-10-29 DIAGNOSIS — M810 Age-related osteoporosis without current pathological fracture: Secondary | ICD-10-CM | POA: Diagnosis not present

## 2021-10-29 DIAGNOSIS — E785 Hyperlipidemia, unspecified: Secondary | ICD-10-CM | POA: Diagnosis not present

## 2021-10-29 DIAGNOSIS — I739 Peripheral vascular disease, unspecified: Secondary | ICD-10-CM | POA: Diagnosis not present

## 2021-10-29 DIAGNOSIS — I10 Essential (primary) hypertension: Secondary | ICD-10-CM | POA: Diagnosis not present

## 2021-11-02 ENCOUNTER — Ambulatory Visit: Payer: Medicare HMO

## 2021-11-02 DIAGNOSIS — I739 Peripheral vascular disease, unspecified: Secondary | ICD-10-CM

## 2021-11-03 ENCOUNTER — Ambulatory Visit (HOSPITAL_COMMUNITY)
Admission: RE | Admit: 2021-11-03 | Discharge: 2021-11-03 | Disposition: A | Discharge status: Home / Self Care | Payer: Medicare HMO | Source: Ambulatory Visit | Attending: Interventional Radiology | Admitting: Interventional Radiology

## 2021-11-03 DIAGNOSIS — I671 Cerebral aneurysm, nonruptured: Secondary | ICD-10-CM | POA: Diagnosis not present

## 2021-11-03 DIAGNOSIS — I725 Aneurysm of other precerebral arteries: Secondary | ICD-10-CM | POA: Diagnosis not present

## 2021-11-03 DIAGNOSIS — I6523 Occlusion and stenosis of bilateral carotid arteries: Secondary | ICD-10-CM | POA: Diagnosis not present

## 2021-11-04 ENCOUNTER — Ambulatory Visit: Payer: Medicare HMO | Admitting: Cardiology

## 2021-11-07 ENCOUNTER — Ambulatory Visit
Admission: RE | Admit: 2021-11-07 | Discharge: 2021-11-07 | Disposition: A | Discharge status: Home / Self Care | Payer: Medicare HMO | Source: Ambulatory Visit | Attending: Family Medicine | Admitting: Family Medicine

## 2021-11-07 DIAGNOSIS — M47817 Spondylosis without myelopathy or radiculopathy, lumbosacral region: Secondary | ICD-10-CM | POA: Diagnosis not present

## 2021-11-07 DIAGNOSIS — M5416 Radiculopathy, lumbar region: Secondary | ICD-10-CM

## 2021-11-07 NOTE — Progress Notes (Signed)
ABI 11/02/2021: This exam reveals normal perfusion of the right lower extremity (ABI 1.06).  This exam reveals moderately decreased perfusion of the left lower extremity, noted at the dorsalis pedis and post tibial artery level (ABI 0.5).  Mildly abnormal biphasic waveform at the right ankle and severely abnormal monophasic waveform at the left ankle.  Consider further work up.  Compared to external report, no change from 04/20/2021.  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

## 2021-11-07 NOTE — Discharge Instructions (Signed)

## 2021-11-09 ENCOUNTER — Telehealth (HOSPITAL_COMMUNITY): Payer: Self-pay

## 2021-11-09 NOTE — Telephone Encounter (Signed)
Called pt's wife regarding recent imaging, no answer, left vm. AW

## 2021-11-09 NOTE — Telephone Encounter (Signed)
Pt agreed to f/u in 1 year with an mra. AW  

## 2021-11-10 ENCOUNTER — Telehealth: Payer: Self-pay

## 2021-11-10 NOTE — Telephone Encounter (Signed)
No. H is very stable from Peripheral artery disease standpoint unless he is having symptoms of leg apin. Results are very stable

## 2021-11-11 NOTE — Telephone Encounter (Signed)
Called and spoke to patient he voiced understanding

## 2021-11-17 ENCOUNTER — Ambulatory Visit: Payer: Medicare HMO | Admitting: Cardiology

## 2021-11-17 ENCOUNTER — Encounter: Payer: Self-pay | Admitting: Cardiology

## 2021-11-17 VITALS — BP 98/62 | HR 72 | Temp 98.3°F | Resp 16 | Ht 67.0 in | Wt 164.0 lb

## 2021-11-17 DIAGNOSIS — I70213 Atherosclerosis of native arteries of extremities with intermittent claudication, bilateral legs: Secondary | ICD-10-CM

## 2021-11-17 DIAGNOSIS — I7121 Aneurysm of the ascending aorta, without rupture: Secondary | ICD-10-CM | POA: Diagnosis not present

## 2021-11-17 DIAGNOSIS — I70222 Atherosclerosis of native arteries of extremities with rest pain, left leg: Secondary | ICD-10-CM | POA: Diagnosis not present

## 2021-11-17 DIAGNOSIS — I10 Essential (primary) hypertension: Secondary | ICD-10-CM

## 2021-11-17 DIAGNOSIS — I77810 Thoracic aortic ectasia: Secondary | ICD-10-CM | POA: Diagnosis not present

## 2021-11-17 MED ORDER — ASPIRIN 81 MG PO CHEW: 81.0000 mg | CHEWABLE_TABLET | Freq: Every day | ORAL | Status: DC

## 2021-11-17 MED ORDER — RIVAROXABAN 2.5 MG PO TABS: 2.5000 mg | ORAL_TABLET | Freq: Two times a day (BID) | ORAL | 3 refills | Status: DC

## 2021-11-17 NOTE — Progress Notes (Signed)
Primary Physician/Referring:  Deland Pretty, MD  Patient ID: Lance Dillon, male    DOB: 12/08/1940, 81 y.o.   MRN: [id number]  Chief Complaint  Patient presents with   Hypertension   Hyperlipidemia   PAD   Follow-up    3 month   HPI:    Lance Dillon  is a 81 y.o. 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   Past medical history significant for hypertension, hyperlipidemia, chronic stage IIIa creatinine disease, chronic anemia and remote tobacco use disorder with a 90-pack-year history quit in 2004.  Patient presents here for a 81-monthoffice visit, continues to have left leg claudication.  No further claudication in his right lower extremity.  He denies chest pain, dyspnea.  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   Bilateral: Noted on MRA of brain in November 2015 with collateral flow from posterior circulation and external collaterals reconstituting anterior circulation   Chronic kidney disease    Colon polyps 2004, 07, 14   COPD (chronic obstructive pulmonary disease) (HCC)    GERD (gastroesophageal reflux disease)    diet controlled, no meds   Glaucoma    left eye    Hx of radiation therapy 03/22/11 to 05/08/11   PSA recurrent carcinoma of prostate   Hypercholesterolemia    Hypertension    Macular  degeneration of both eyes    Peripheral vascular disease [place]   Bilateral SFA stenosis: Status post right femoropopliteal bypass - Dr. 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 Negative for chest pain, dyspnea on exertion and leg swelling.   Objective  Blood pressure 98/62, pulse 72, temperature 98.3 F (36.8 C), temperature source Temporal, resp. rate 16, height 5' 7"  (1.702 m), weight 164 lb (74.4 kg), SpO2 96 %. Body mass index is 25.69 kg/m.     11/17/2021    9:34 AM 11/07/2021    8:37 AM 10/27/2021    9:11 AM  Vitals with BMI  Height 5' 7"   5' 7"   Weight 164 lbs  166 lbs  BMI 270.96 228.36 Systolic 98 1629  735  Diastolic 62 74 78  Pulse 72 66 75    Physical Exam Neck:     Vascular: No carotid bruit or JVD.  Cardiovascular:     Rate and Rhythm: Normal rate and regular rhythm.     Pulses: Intact distal pulses.          Carotid pulses are 2+ on the right side and 2+ on the left side.      Femoral pulses are 2+ on the right side and 1+ on the left side.      Popliteal pulses are 2+ on the right side and 0 on the left side.       Dorsalis pedis pulses are 2+ on the right side and 0 on the left side.       Posterior tibial pulses are 0 on the right side and 0 on the left side.     Heart sounds: Normal heart sounds. No murmur heard.    No gallop.  Pulmonary:     Effort: Pulmonary effort is  normal.     Breath sounds: Normal breath sounds.  Abdominal:     General: Bowel sounds are normal.     Palpations: Abdomen is soft.  Musculoskeletal:     Right lower leg: No edema.     Left lower leg: No edema.     Medications and allergies   Allergies  Allergen Reactions   Avelox [Moxifloxacin Hcl In Nacl] Diarrhea   Moxifloxacin Diarrhea   Cilostazol Diarrhea   Cyclobenzaprine Other (See Comments)   Quinolones     Other reaction(s): Unknown   Ramipril     Other reaction(s): Unknown     Medication list after today's encounter    Current Outpatient Medications:    acetaminophen (TYLENOL) 500 MG tablet, Take 500 mg by mouth every 6 (six) hours as needed for mild pain or headache., Disp: , Rfl:    amLODipine (NORVASC) 2.5 MG tablet, Take 1 tablet (2.5 mg total) by mouth at bedtime., Disp: 90 tablet, Rfl: 2   aspirin (ASPIRIN CHILDRENS) 81 MG chewable tablet, Chew 1 tablet (81 mg total) by mouth daily., Disp: , Rfl:    Calcium Carbonate-Vitamin D (CALTRATE 600+D PO), Take 1 tablet by mouth daily. , Disp: , Rfl:    docusate sodium (COLACE) 100 MG capsule, Take 1 capsule (100 mg total) by mouth daily as needed for mild constipation., Disp: , Rfl: 0   fluticasone (FLONASE) 50 MCG/ACT nasal spray, Place 2 sprays into both nostrils daily as needed for rhinitis or allergies (or nasal congestion)., Disp: , Rfl:    latanoprost (XALATAN) 0.005 % ophthalmic solution, Place 1 drop into the left eye at bedtime. , Disp: , Rfl:    loratadine (CLARITIN) 10 MG tablet, 1 tablet Orally Once a day as needed, Disp: , Rfl:    losartan-hydrochlorothiazide (HYZAAR) 100-12.5 MG tablet, Take 1 tablet by mouth daily., Disp: , Rfl:    LUTEIN PO, Take 1 capsule by mouth daily., Disp: , Rfl:    metoprolol succinate (TOPROL-XL) 25 MG 24 hr tablet, TAKE 1 TABLET (25 MG TOTAL) BY MOUTH DAILY. ** DO NOT CRUSH ** (BETA BLOCKER), Disp: 90 tablet, Rfl: 3   Multiple Vitamin (MULTIVITAMIN WITH MINERALS) TABS tablet,  Take 1 tablet by mouth daily., Disp: 30 tablet, Rfl: 0   rivaroxaban (XARELTO) 2.5 MG TABS tablet, Take 1 tablet (2.5 mg total) by mouth 2 (two) times daily., Disp: 60 tablet, Rfl: 3   rosuvastatin (CRESTOR) 10 MG tablet,  TAKE 1 TABLET BY MOUTH EVERY DAY (Patient taking differently: Take 10 mg by mouth at bedtime.), Disp: 90 tablet, Rfl: 3   vitamin B-12 (CYANOCOBALAMIN) 500 MCG tablet, Take 1 tablet (500 mcg total) by mouth daily., Disp: 30 tablet, Rfl: 0   Vitamin D, Ergocalciferol, (DRISDOL) 1.25 MG (50000 UNIT) CAPS capsule, 1 capsule Orally once weekly for 84 days, Disp: , Rfl:   Laboratory examination:   Recent Labs    12/23/20 0112 12/24/20 0206 12/28/20 0751 12/29/20 0135 10/07/21 1310  NA 134* 133* 136 133* 141  K 3.9 4.2 4.5 3.8 4.3  CL 106 102 102 101 98  CO2 23 24  --  27 25  GLUCOSE 119* 109* 114* 113* 85  BUN 19 17 31* 27* 28*  CREATININE 1.17 1.26* 1.40* 1.36* 1.25  CALCIUM 7.8* 7.8*  --  8.1* 10.2  GFRNONAA >60 58*  --  53*  --    Lab Results  Component Value Date   ALT 15 12/19/2020   AST 16 12/19/2020   ALKPHOS 68 12/19/2020   BILITOT 1.3 (H) 12/19/2020       Latest Ref Rng & Units 12/30/2020    1:12 AM 12/29/2020    1:35 AM 12/28/2020    7:51 AM  CBC  WBC 4.0 - 10.5 K/uL 4.3  4.1    Hemoglobin 13.0 - 17.0 g/dL 8.4  7.7  9.5   Hematocrit 39.0 - 52.0 % 25.9  23.7  28.0   Platelets 150 - 400 K/uL 185  168      Lipid Panel Recent Labs    12/22/20 0126  CHOL 81  TRIG 52  LDLCALC 34  VLDL 10  HDL 37*  CHOLHDL 2.2   Labs 03/23/2021:  Lipoprotein (a) <75.0 nmol/L  16.6  (<75)  Apo A1 + B + Ratio     0.4   (0.00 to 0.7)  TSH No results for input(s): "TSH" in the last 8760 hours.  External labs:   Labs 05/12/2021:  Hb 13.9/HCT 39.8, platelets 161, normal indicis.  Vitamin D28.0.  A1c 5.6%.   Labs 01/06/2021:  Vitamin B12 855.  Sodium 140, potassium 4.5, serum glucose 97 mg, p.o. 26, creatinine 1.21, EGFR 56 mL, LFTs normal.  Iron  studies normal.  Hb 10.2/HCT 32.1, platelets 299.  Labs 12/06/2020:  Total cholesterol 129, triglycerides 114, HDL 50, LDL 56.  Vitamin D 31.5, A1c 5.8%.  Hb 13.6/HCT 39.1, platelets 152.  Radiology:  MRI angio head 09/13/2020, comparison 08/26/2019:  1. Unchanged fusiform aneurysm involving the basilar tip and right P1 segment. 2. Unchanged appearance of bilateral ICA occlusion with intracranial reconstitution.  CT Chest 08/28/2021: 1. No imaging findings to suggest interstitial lung disease.  2. Diffuse bronchial wall thickening with moderate centrilobular and paraseptal emphysema; imaging findings suggestive of underlying COPD. 3. Extensive pleuroparenchymal thickening and architectural distortion in the lung apices, most compatible with areas of chronic post infectious or inflammatory scarring. 4. Aortic atherosclerosis, in addition to left main and three-vessel coronary artery disease. Large calcified atherosclerotic plaque at the ostium of the left subclavian artery, incompletely evaluated on today's noncontrast CT examination, but likely associated with severe stenosis. 5. There are calcifications of the aortic valve and mitral annulus. Echocardiographic correlation for evaluation of potential valvular dysfunction may be warranted if clinically indicated. 6. Ectasia of ascending thoracic aorta (4.3 cm in diameter).  Cardiac Studies:   Peripheral arteriogram 12/20/2020: Findings: Left common femoral artery appeared to be occluded I was able to cannulate  this just at the top prior to a collateral and get access.  Performed aortogram which demonstrated heavily calcified aorta and bilateral common and external iliac arteries.  I then crossed over to the common iliac artery on the right where I had collaterals to fill the right lower extremity.  I performed angiography was demonstrated occluded right common femoral with long segment occlusion of his SFA which was heavily calcified  reconstitutes at the level of the knee where it is diseased below the knee is much less diseased appears to have runoff via the anterior tibial and peroneal arteries.  Plan will be for right common femoral endarterectomy and femoral to below-knee popliteal artery bypass which will likely need to be performed with graft given the vein has been harvested in the past for bypass.  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  Renal artery duplex  04/20/2021: Right: No evidence of right renal artery stenosis. RRV flow present.  Normal size right kidney.  Left:  No evidence of left renal artery stenosis. LRV flow present.  Low flow velocities detected in the left renal artery as seen on the prior exam in 2021. Kidney length (cm)11.14Kidney length (cm)9.20 (7.5 cm previously)  Carotid artery duplex 04/20/2021: Right Carotid: Evidence consistent with a total occlusion of the right ICA. Left Carotid: Velocities in the left ICA are consistent with a 1-39% stenosis. Vertebrals:  Bilateral vertebral arteries demonstrate antegrade flow. Subclavians: Right subclavian artery flow was disturbed.  PCV ECHOCARDIOGRAM COMPLETE 03/18/2021  Normal LV systolic function with EF 58%. Left ventricle cavity is normal in size. Mild concentric remodeling of the left ventricle. Normal global wall motion. Indeterminate diastolic filling pattern. Calculated EF 58%. Trileaflet aortic valve. No evidence of aortic stenosis. Moderate (Grade III) aortic regurgitation. Moderate calcification of the aortic valve annulus. Mild aortic valve leaflet calcification. Mildly restricted aortic valve leaflets. No evidence of mitral stenosis. Mild (Grade I) mitral regurgitation. Moderate calcification of the mitral valve annulus. Moderate mitral valve leaflet calcification. Moderately restricted mitral valve leaflets. Structurally normal tricuspid valve.  Mild tricuspid regurgitation. No evidence of pulmonary  hypertension. The aortic root is mildly dilated at 3.8 cm. Mildly dilated ascending aorta 4.0 cm.Marland Kitchen    PCV MYOCARDIAL PERFUSION WITH LEXISCAN 04/05/2021  Nondiagnostic ECG stress. The heart rate response was consistent with Lexiscan. There is mild motion artifact in the inferior wall. Otherwise myocardial perfusion is normal. Overall LV systolic function is normal without regional wall motion abnormalities. Stress LV EF: 79%. Small volume LV. No previous exam available for comparison. Low risk.  ABI 11/02/2021: This exam reveals normal perfusion of the right lower extremity (ABI 1.06).  This exam reveals moderately decreased perfusion of the left lower extremity, noted at the dorsalis pedis and post tibial artery level (ABI 0.5).  Mildly abnormal biphasic waveform at the right ankle and severely abnormal monophasic waveform at the left ankle.  Consider further work up.   Compared to external report, no change from 04/20/2021.  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   EKG:   EKG 03/16/2021: Sinus rhythm with first-degree block at rate of 74 bpm, left atrial enlargement, incomplete right bundle branch block.  Otherwise normal EKG.    Assessment     ICD-10-CM   1. Atherosclerosis of native artery of both lower extremities with intermittent claudication (HCC)  I70.213 rivaroxaban (XARELTO) 2.5 MG TABS tablet    aspirin (ASPIRIN CHILDRENS) 81 MG chewable tablet    2. Critical limb ischemia  of left lower extremity (HCC)  I70.222 rivaroxaban (XARELTO) 2.5 MG TABS tablet    3. Aneurysm of ascending aorta without rupture (HCC)  I71.21 PCV ECHOCARDIOGRAM COMPLETE    4. Aortic root dilatation (HCC)  I77.810 PCV ECHOCARDIOGRAM COMPLETE    5. Primary hypertension  I10       Medications Discontinued During This Encounter  Medication Dillon   clopidogrel (PLAVIX) 75 MG tablet Change in therapy     Meds ordered this encounter  Medications   rivaroxaban (XARELTO) 2.5 MG  TABS tablet    Sig: Take 1 tablet (2.5 mg total) by mouth 2 (two) times daily.    Dispense:  60 tablet    Refill:  3   aspirin (ASPIRIN CHILDRENS) 81 MG chewable tablet    Sig: Chew 1 tablet (81 mg total) by mouth daily.   Orders Placed This Encounter  Procedures   PCV ECHOCARDIOGRAM COMPLETE    Standing Status:   Future    Standing Expiration Date:   11/18/2022   Recommendations:   Lance Dillon is a 81 y.o.  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   Past medical history significant for hypertension, hyperlipidemia, chronic stage IIIa creatinine disease, chronic anemia and remote tobacco use disorder with a 90-pack-year history quit in 2004.  Patient presents here for a 52-monthoffice visit, continues to have left leg claudication.  I reviewed the ABI, has remained stable at severe critical limb ischemia level however nonlimb threatening.  We discussed medical therapy versus surgical intervention again, extremely high risk individual with heavily calcified vessels.  Presently does not have any wounds, no rest pain, capillary refill is normal, I will discontinue Plavix and switch him back to aspirin 81 mg daily and add Xarelto 2.5 mg twice daily.  With regard to aortic root dilatation and ascending aortic aneurysm, we will recheck his echocardiogram in 6 months, I would like to see him back then.  Otherwise blood pressure is well controlled, he is on high intensity statins, no changes in the medications otherwise done today.  He would like to continue to follow-up with me for vascular issues going  forward.     JAdrian Prows MD, FPend [facility]reille Surgery [facility] LLC10/19/2023, 10:24 AM Office: [phone]

## 2021-11-21 NOTE — Progress Notes (Signed)
I, Lance Dillon, LAT, ATC acting as a scribe for [name], MD.  Lance Dillon is a 81 y.o. male who presents to [place] at [facility] today for f/u chronic LBP after facet injections on 11/07/21. Pt was last seen by Dr. Georgina Snell on 10/27/21 and had contralateral right-sided facet injections at L4-5 and L5-S1.  Prior to that he had left-sided facet injections at L4-L5 and L5-S1 today, pt reports some improvement in the radiating pain into his legs, but cont to have pain in the midline and R-side of his low back.  His main concern today is the axial right low back pain.  Dx imaging: 09/27/21 L-spine MRI 08/24/21 L-spine & pelvis XR  Pertinent review of systems: No fevers or chills  Relevant historical information: Peripheral vascular disease.  Heart disease.  On Xarelto.   Exam:  BP 112/64   Pulse 82   Ht '5\' 7"'$  (1.702 m)   Wt 167 lb (75.8 kg)   SpO2 95%   BMI 26.16 kg/m  General: Well Developed, well nourished, and in no acute distress.   MSK: L-spine: Normal appearing Tender palpation right SI joint region.  Decreased lumbar motion.  Mild antalgic gait.    Lab and Radiology Results  Procedure: Real-time Ultrasound Guided Injection of right SI joint Device: Philips Affiniti 50G Images permanently stored and available for review in PACS Verbal informed consent obtained.  Discussed risks and benefits of procedure. Warned about infection, bleeding, hyperglycemia damage to structures among others. Patient expresses understanding and agreement Time-out conducted.   Noted no overlying erythema, induration, or other signs of local infection.   Skin prepped in a sterile fashion.   Local anesthesia: Topical Ethyl chloride.   With sterile technique and under real time ultrasound guidance: 40 mg of Kenalog and 2 mL of Marcaine injected into right SI joint. Fluid seen entering the joint capsule.   Completed without difficulty   Pain moderately resolved suggesting  accurate placement of the medication.   Advised to call if fevers/chills, erythema, induration, drainage, or persistent bleeding.   Images permanently stored and available for review in the ultrasound unit.  Impression: Technically successful ultrasound guided injection.         Assessment and Plan: 81 y.o. male with chronic axial low back pain.  Patient has multifactorial low back pain.  He has significant degenerative changes on his recent lumbar spine MRI including facet DJD, spinal stenosis, and scoliosis. He has now had bilateral facet injections L4-L5 and L5-S1 with mild to moderate benefit.  He had a right SI joint injection which I am hopeful will provide some more significant benefit.  If this does not work well enough he will let me know and I will proceed to epidural steroid injection likely interlaminar at L4-5 or L5-S1. Additionally could proceed with medial branch block and ablation bilateral L4-5 and L5-S1.  He will keep me updated.   PDMP not reviewed this encounter. Orders Placed This Encounter  Procedures   [place] LIMITED JOINT SPACE STRUCTURES LOW RIGHT(NO LINKED CHARGES)    Order Specific Question:   Reason for Exam (SYMPTOM  OR DIAGNOSIS REQUIRED)    Answer:   low back pain    Order Specific Question:   Preferred imaging location?    Answer:   [place]   No orders of the defined types were placed in this encounter.    Discussed warning signs or symptoms. Please see discharge instructions. Patient expresses understanding.  The above documentation has been reviewed and is accurate and complete [name], M.D.  Total encounter time 30 minutes including face-to-face time with the patient and, reviewing past medical record, and charting on the date of service.   Discussion treatment plan and options and reviewing imaging.  Time-based billing excludes time to perform the injection.

## 2021-11-22 ENCOUNTER — Ambulatory Visit: Payer: Self-pay

## 2021-11-22 ENCOUNTER — Ambulatory Visit: Payer: Medicare HMO | Admitting: Family Medicine

## 2021-11-22 VITALS — BP 112/64 | HR 82 | Ht 67.0 in | Wt 167.0 lb

## 2021-11-22 DIAGNOSIS — G8929 Other chronic pain: Secondary | ICD-10-CM

## 2021-11-22 DIAGNOSIS — M545 Low back pain, unspecified: Secondary | ICD-10-CM | POA: Diagnosis not present

## 2021-11-22 NOTE — Patient Instructions (Addendum)
Thank you for coming in today.   You received an injection today. Seek immediate medical attention if the joint becomes red, extremely painful, or is oozing fluid.   Let me know how it feels after a few days. If not better we can proceed to an epidural steroid injection.

## 2021-12-08 ENCOUNTER — Ambulatory Visit: Payer: Medicare HMO | Admitting: Family Medicine

## 2021-12-08 VITALS — BP 104/62 | HR 89 | Ht 67.0 in | Wt 169.0 lb

## 2021-12-08 DIAGNOSIS — G8929 Other chronic pain: Secondary | ICD-10-CM | POA: Diagnosis not present

## 2021-12-08 DIAGNOSIS — E559 Vitamin D deficiency, unspecified: Secondary | ICD-10-CM | POA: Diagnosis not present

## 2021-12-08 DIAGNOSIS — M810 Age-related osteoporosis without current pathological fracture: Secondary | ICD-10-CM | POA: Diagnosis not present

## 2021-12-08 DIAGNOSIS — M545 Low back pain, unspecified: Secondary | ICD-10-CM

## 2021-12-08 NOTE — Patient Instructions (Addendum)
Thank you for coming in today.   We will try a different back injeciton.  Let me know how it goes.   I can move to the next thing based on how your feel with a phone call.   If the SI joint pain returns we can do nerve ablations there.

## 2021-12-08 NOTE — Progress Notes (Signed)
I, Peterson Lombard, LAT, ATC acting as a scribe for [name], MD.  Lance Dillon is a 81 y.o. male who presents to [facility] today for exacerbation of his chronic bilateral low back pain.  Patient was last seen by Dr. Georgina Snell on 11/22/2021 and was given a right SI joint steroid injection.  Today, patient reports some relief from prior SI joint. Pt continues to have bilat low back pain.   Radiating pain: yes- sometime into L buttock LE numbness/tingling: no LE weakness: yes Treatments tried: HEP, Tylenol  Dx imaging: 09/27/21 L-spine MRI 08/24/21 L-spine & pelvis XR  Pertinent review of systems: No fevers or chills  Relevant historical information: Scoliosis   Exam:  BP 104/62   Pulse 89   Ht '5\' 7"'$  (1.702 m)   Wt 169 lb (76.7 kg)   SpO2 96%   BMI 26.47 kg/m  General: Well Developed, well nourished, and in no acute distress.   MSK: L-spine wearing back brace nontender to palpation midline tender palpation lumbar paraspinal musculature decreased lumbar motion.    Lab and Radiology Results Narrative & Impression  CLINICAL DATA:  Low back pain with facet arthritis. Plan for injections. Low back pain radiating down bilateral legs for 3-4 years. History of prostate cancer.   EXAM: MRI LUMBAR SPINE WITHOUT CONTRAST   TECHNIQUE: Multiplanar, multisequence MR imaging of the lumbar spine was performed. No intravenous contrast was administered.   COMPARISON:  Lumbar radiographs 08/24/2021 and CT abdomen/pelvis 06/06/2015   FINDINGS: Segmentation: Standard; the lowest formed disc space is designated L5-S1.   Alignment: There is marked levocurvature centered at L3 with 3 mm right lateral listhesis of L2 on L3 and 7 mm left lateral listhesis of L4 on L5. There is no significant antero or retrolisthesis.   Vertebrae: Vertebral body heights are preserved. Background marrow signal is normal. There is no suspicious marrow signal  abnormality. There is degenerative endplate marrow signal abnormality without edema at L3-L4.   Conus medullaris and cauda equina: Conus extends to the L1 level. Conus and cauda equina appear normal.   Paraspinal and other soft tissues: There is atrophy of the left kidney. The paraspinal soft tissues are unremarkable.   Disc levels:   There is marked disc desiccation and narrowing eccentric to the right at L3-L4 due to the scoliotic curvature. There is more mild disc degeneration at the other levels. There is facet arthropathy most advanced on the right at L3-L4, bilaterally at L4-L5, and on the left at L5-S1 with associated effusions. There is no osseous irregularity or destruction to suggest septic arthritis.   T12-L1: No significant spinal canal or neural foraminal stenosis   L1-L2: There is a diffuse disc bulge resulting in mild spinal canal stenosis without significant neural foraminal stenosis.   L2-L3: There is a diffuse disc bulge eccentric to the left and mild bilateral facet arthropathy without significant spinal canal or neural foraminal stenosis   L3-L4: There is a diffuse disc bulge with a prominent right foraminal/extraforaminal protrusion and advanced right worse than left facet arthropathy with ligamentum flavum thickening resulting in right subarticular zone narrowing without evidence of nerve root impingement, and severe right and mild left neural foraminal stenosis with impingement of the exiting right L3 nerve root.   L4-L5: There is a diffuse disc bulge with a prominent right foraminal/extraforaminal protrusion, and advanced left worse than right facet arthropathy with effusions resulting in effacement of the left subarticular zone with possible impingement of  the traversing left L5 nerve root, narrowing of the right subarticular zone without evidence of nerve root impingement, and severe right and moderate left neural foraminal stenosis with impingement  of the exiting right L4 nerve root.   L5-S1: There is a disc bulge eccentric to the left with a left foraminal protrusion and advanced left facet arthropathy with an effusion resulting in severe left neural foraminal stenosis with impingement of the exiting L5 nerve root. No significant spinal canal or left neural foraminal stenosis.   IMPRESSION: 1. 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 2. Associated advanced disc degeneration eccentric to the right at L3-L4 with a right foraminal/extraforaminal protrusion and advanced right facet arthropathy resulting in severe right neural foraminal stenosis with impingement of the exiting L3 nerve root. 3. Disc bulge with a right foraminal protrusion and advanced left worse than right facet arthropathy at L4-L5 resulting in left subarticular zone effacement with possible impingement of the traversing left L5 nerve root, moderate left neural foraminal stenosis, and severe right neural foraminal stenosis with impingement of the exiting right L4 nerve root. 4. Advanced left facet arthropathy at L5-S1 with a left foraminal protrusion resulting in severe left neural foraminal stenosis with impingement of the exiting L5 nerve root. 5. Facet joint effusions most pronounced on the right at L4-L5 and on the left at L5-S1.     Electronically Signed   By: Valetta Mole M.D.   On: 09/28/2021 13:07      Assessment and Plan: 81 y.o. male with chronic low back pain occurring bilaterally. The source of pain has been challenging to figure out.  He has had bilateral facet injections L4-5 and L5-S1 with little benefit.  He most recently had a right SI joint injection which helped some.  He notes that helped the pain in the right SI joint region but he still has pain around the belt line bilateral low back.  We will try an epidural steroid injection.  I will defer to radiology but I think the L3-4 L4-5 level interlaminar approach would probably be best.  If this  does not work we will start try to target the bilateral L3-4 facet joints.  He will keep me updated.   PDMP not reviewed this encounter. Orders Placed This Encounter  Procedures   DG INJECT DIAG/THERA/INC NEEDLE/CATH/PLC EPI/LUMB/SAC W/IMG    Standing Status:   Future    Standing Expiration Date:   12/09/2022    Order Specific Question:   Reason for Exam (SYMPTOM  OR DIAGNOSIS REQUIRED)    Answer:   ESI or NRB. Level and technique per radiology. Possible L3-4 or L4-5    Order Specific Question:   Preferred Imaging Location?    Answer:   [id number]    Order Specific Question:   Radiology Contrast Protocol - do NOT remove file path    Answer:   [email or web address]   No orders of the defined types were placed in this encounter.    Discussed warning signs or symptoms. Please see discharge instructions. Patient expresses understanding.   .[email or web address]

## 2021-12-12 ENCOUNTER — Telehealth: Payer: Self-pay | Admitting: *Deleted

## 2021-12-12 NOTE — Telephone Encounter (Signed)
Our office received a fax today for pre op clearance. In review of the pt's chart I see that it looks to be that the pt is being followed by Dr. [facility] now and not [place]. I advised Cathy C. With [facility] of this and she stated that she faxed to both our office and Dr. Einar Gip. I informed Tye [place] that pt seems to be following Dr. Einar Gip now as primary card.

## 2021-12-13 ENCOUNTER — Encounter: Payer: Self-pay | Admitting: Cardiology

## 2021-12-19 ENCOUNTER — Ambulatory Visit
Admission: RE | Admit: 2021-12-19 | Discharge: 2021-12-19 | Disposition: A | Discharge status: Home / Self Care | Payer: Medicare HMO | Source: Ambulatory Visit | Attending: Family Medicine | Admitting: Family Medicine

## 2021-12-19 ENCOUNTER — Encounter (INDEPENDENT_AMBULATORY_CARE_PROVIDER_SITE_OTHER): Payer: Medicare HMO | Admitting: Ophthalmology

## 2021-12-19 DIAGNOSIS — M545 Low back pain, unspecified: Secondary | ICD-10-CM

## 2021-12-19 DIAGNOSIS — M47817 Spondylosis without myelopathy or radiculopathy, lumbosacral region: Secondary | ICD-10-CM | POA: Diagnosis not present

## 2021-12-19 MED ORDER — METHYLPREDNISOLONE ACETATE 40 MG/ML INJ SUSP (RADIOLOG
80.0000 mg | Freq: Once | INTRAMUSCULAR | Status: AC
  Administered 2021-12-19: 80 mg via EPIDURAL

## 2021-12-19 MED ORDER — IOPAMIDOL (ISOVUE-M 200) INJECTION 41%
1.0000 mL | Freq: Once | INTRAMUSCULAR | Status: AC
  Administered 2021-12-19: 1 mL via EPIDURAL

## 2021-12-19 NOTE — Discharge Instructions (Signed)
Post Procedure Spinal Discharge Instruction Sheet  You may resume a regular diet and any medications that you routinely take (including pain medications) unless otherwise noted by MD.  No driving day of procedure.  Light activity throughout the rest of the day.  Do not do any strenuous work, exercise, bending or lifting.  The day following the procedure, you can resume normal physical activity but you should refrain from exercising or physical therapy for at least three days thereafter.  You may apply ice to the injection site, 20 minutes on, 20 minutes off, as needed. Do not apply ice directly to skin.    Common Side Effects:  Headaches- take your usual medications as directed by your physician.  Increase your fluid intake.  Caffeinated beverages may be helpful.  Lie flat in bed until your headache resolves.  Restlessness or inability to sleep- you may have trouble sleeping for the next few days.  Ask your referring physician if you need any medication for sleep.  Facial flushing or redness- should subside within a few days.  Increased pain- a temporary increase in pain a day or two following your procedure is not unusual.  Take your pain medication as prescribed by your referring physician.  Leg cramps  Please contact our office at [phone] for the following symptoms: Fever greater than 100 degrees. Headaches unresolved with medication after 2-3 days. Increased swelling, pain, or redness at injection site.   Thank you for visiting [facility] today.   YOU MAY RESUME YOUR ASPIRIN ANYTIME AFTER PROCEDURE TODAY & YOUR XARELTO 24 HOURS AFTER PROCEDURE

## 2021-12-28 DIAGNOSIS — Z0100 Encounter for examination of eyes and vision without abnormal findings: Secondary | ICD-10-CM | POA: Diagnosis not present

## 2021-12-28 DIAGNOSIS — H43812 Vitreous degeneration, left eye: Secondary | ICD-10-CM | POA: Diagnosis not present

## 2021-12-28 DIAGNOSIS — H353134 Nonexudative age-related macular degeneration, bilateral, advanced atrophic with subfoveal involvement: Secondary | ICD-10-CM | POA: Diagnosis not present

## 2021-12-28 DIAGNOSIS — H401133 Primary open-angle glaucoma, bilateral, severe stage: Secondary | ICD-10-CM | POA: Diagnosis not present

## 2021-12-29 DIAGNOSIS — I739 Peripheral vascular disease, unspecified: Secondary | ICD-10-CM | POA: Diagnosis not present

## 2021-12-29 DIAGNOSIS — M810 Age-related osteoporosis without current pathological fracture: Secondary | ICD-10-CM | POA: Diagnosis not present

## 2021-12-29 DIAGNOSIS — E785 Hyperlipidemia, unspecified: Secondary | ICD-10-CM | POA: Diagnosis not present

## 2021-12-29 DIAGNOSIS — I10 Essential (primary) hypertension: Secondary | ICD-10-CM | POA: Diagnosis not present

## 2022-01-02 ENCOUNTER — Ambulatory Visit: Payer: Medicare HMO | Admitting: Cardiology

## 2022-01-04 DIAGNOSIS — C61 Malignant neoplasm of prostate: Secondary | ICD-10-CM | POA: Diagnosis not present

## 2022-01-11 DIAGNOSIS — N35012 Post-traumatic membranous urethral stricture: Secondary | ICD-10-CM | POA: Diagnosis not present

## 2022-01-11 DIAGNOSIS — N393 Stress incontinence (female) (male): Secondary | ICD-10-CM | POA: Diagnosis not present

## 2022-01-11 DIAGNOSIS — C61 Malignant neoplasm of prostate: Secondary | ICD-10-CM | POA: Diagnosis not present

## 2022-01-27 ENCOUNTER — Telehealth: Payer: Self-pay | Admitting: Internal Medicine

## 2022-01-27 MED ORDER — AZITHROMYCIN 250 MG PO TABS: ORAL_TABLET | ORAL | 0 refills | Status: DC

## 2022-01-27 MED ORDER — PREDNISONE 10 MG PO TABS: ORAL_TABLET | ORAL | 0 refills | Status: DC

## 2022-01-27 NOTE — Telephone Encounter (Signed)
Spoke with Inez Catalina (pt's wife per [facility]) and reviewed advise and medication instructions. Inez Catalina stated understanding and verified pharmacy. Orders placed. Nothing further needed at this time.

## 2022-01-27 NOTE — Telephone Encounter (Signed)
Zpak/ Prednisone 10 mg take  4 each am x 2 days,   2 each am x 2 days,  1 each am x 2 days and stop  

## 2022-01-27 NOTE — Telephone Encounter (Signed)
Primary Pulmonologist: Wert Last office visit and with whom: 08/22/2021 Wert What do we see them for (pulmonary problems): Post inflammatory pulmonary fibrosis, COPD Gold Last OV assessment/plan:   Assessment & Plan Note by Tanda Rockers, MD at 08/23/2021 4:23 AM  Author: Tanda Rockers, MD Author Type: Physician Filed: 08/23/2021  4:26 AM  Note Status: [place] List: Cosign Not Required Encounter Date: 08/22/2021  Problem: COPD GOLD II    Editor: Tanda Rockers, MD (Physician)      Prior Versions: 1. Tanda Rockers, MD (Physician) at 08/23/2021  4:25 AM - Written  Quit smokng 2004 - Spirometry 07/19/2017  FEV1 1.68 (61%)  Ratio 65 with classic curvature   - 02/12/2018    changed from [place] 200 to 100 2bid as cough is the main symptom - 03/12/2018  After extensive coaching inhaler device,  effectiveness =    90% > continue on symbicort 80 as insurance prefers but ok to taper if doing great >  pt d/c'd and no worse off   Presently Pt is Group B in terms of symptom/risk and laba/lama therefore appropriate rx at this point >>>  stiolto or bevespi or anoro approp but he is not convinced he improved on laba previously or needs additional ventilatory capacity now so left open adding the lama/laba until he feels really limited in desired activities              Patient Instructions by Tanda Rockers, MD at 08/22/2021 3:45 PM  Author: Tanda Rockers, MD Author Type: Physician Filed: 08/22/2021  4:19 PM  Note Status: Signed Cosign: Cosign Not Required Encounter Date: 08/22/2021  Editor: Tanda Rockers, MD (Physician)             To get the most out of exercise, you need to be continuously aware that you are short of breath, but never out of breath, for at least 30 minutes daily. As you improve, it will actually be easier for you to do the same amount of exercise  in  30 minutes so always push to the level where you are short of breath.        Make sure you check your oxygen saturations at  highest level of activity      My office will be contacting you by phone for referral for CT chest        Please schedule a follow up visit in 6 months but call sooner if needed        Orthostatic Vitals Recorded in This Encounter   08/22/2021 1552     Patient Position: Sitting  BP Location: Left Arm  Cuff Size: Normal   Instructions  To get the most out of exercise, you need to be continuously aware that you are short of breath, but never out of breath, for at least 30 minutes daily. As you improve, it will actually be easier for you to do the same amount of exercise  in  30 minutes so always push to the level where you are short of breath.        Make sure you check your oxygen saturations at highest level of activity      My office will be contacting you by phone for referral for CT chest        Please schedule a follow up visit in 6 months but call sooner if needed       Was appointment offered to patient (explain)?  no  Reason for call: Started with a cough 12/25, sometimes coughs up mucous and it is clear.  He was up most of the night coughing.  He is taking Mucinex since 12/25 in the night, it keeps it loose, but does not suppress the cough.  Denies any colored mucous, fever, chills or body aches.  He does not use any inhalers or nebs.  Denies any congestion in chest.  Dr. Melvyn Novas, please advise.  Thank you.  (examples of things to ask: : When did symptoms start? Fever? Cough? Productive? Color to sputum? More sputum than usual? Wheezing? Have you needed increased oxygen? Are you taking your respiratory medications? What over the counter measures have you tried?)  Allergies  Allergen Reactions   Avelox [Moxifloxacin Hcl In Nacl] Diarrhea   Moxifloxacin Diarrhea   Cilostazol Diarrhea   Cyclobenzaprine Other (See Comments)   Quinolones     Other reaction(s): Unknown   Ramipril     Other reaction(s): Unknown    Immunization History  Administered Date(s)  Administered   DTaP 10/17/2010   Influenza, High Dose Seasonal PF 11/13/2014, 10/19/2015, 10/23/2018   Influenza, Quadrivalent, Recombinant, Inj, Pf 11/07/2016, 10/18/2017, 10/23/2018, 10/24/2019, 11/02/2020   Influenza,inj,quad, With Preservative 10/30/2016   Influenza-Unspecified 10/17/2010, 11/24/2011, 11/08/2012, 12/03/2013, 10/31/2014   PFIZER(Purple Top)SARS-COV-2 Vaccination 02/20/2019, 03/13/2019, 12/01/2019   Pneumococcal Conjugate-13 05/28/2013   Pneumococcal-Unspecified 01/20/2009, 11/05/2013, 06/12/2014   Td 08/16/2020   Tdap 10/17/2010, 08/16/2020   Zoster Recombinat (Shingrix) 11/19/2017, 02/11/2018   Zoster, Live 09/20/2009   Zoster, Unspecified 09/20/2009

## 2022-02-04 ENCOUNTER — Encounter (HOSPITAL_BASED_OUTPATIENT_CLINIC_OR_DEPARTMENT_OTHER): Payer: Self-pay

## 2022-02-04 ENCOUNTER — Other Ambulatory Visit: Payer: Self-pay

## 2022-02-04 ENCOUNTER — Emergency Department (HOSPITAL_BASED_OUTPATIENT_CLINIC_OR_DEPARTMENT_OTHER): Payer: Medicare HMO

## 2022-02-04 ENCOUNTER — Emergency Department (HOSPITAL_BASED_OUTPATIENT_CLINIC_OR_DEPARTMENT_OTHER)
Admission: EM | Admit: 2022-02-04 | Discharge: 2022-02-04 | Disposition: A | Discharge status: Home / Self Care | Payer: Medicare HMO | Attending: Emergency Medicine | Admitting: Emergency Medicine

## 2022-02-04 DIAGNOSIS — U071 COVID-19: Secondary | ICD-10-CM | POA: Insufficient documentation

## 2022-02-04 DIAGNOSIS — Z7982 Long term (current) use of aspirin: Secondary | ICD-10-CM | POA: Insufficient documentation

## 2022-02-04 DIAGNOSIS — J449 Chronic obstructive pulmonary disease, unspecified: Secondary | ICD-10-CM | POA: Insufficient documentation

## 2022-02-04 DIAGNOSIS — Z1152 Encounter for screening for COVID-19: Secondary | ICD-10-CM | POA: Insufficient documentation

## 2022-02-04 DIAGNOSIS — R059 Cough, unspecified: Secondary | ICD-10-CM | POA: Diagnosis present

## 2022-02-04 DIAGNOSIS — Z7951 Long term (current) use of inhaled steroids: Secondary | ICD-10-CM | POA: Diagnosis not present

## 2022-02-04 DIAGNOSIS — R051 Acute cough: Secondary | ICD-10-CM | POA: Diagnosis not present

## 2022-02-04 LAB — CBC
HCT: 40.2 % (ref 39.0–52.0)
Hemoglobin: 14.1 g/dL (ref 13.0–17.0)
MCH: 33.4 pg (ref 26.0–34.0)
MCHC: 35.1 g/dL (ref 30.0–36.0)
MCV: 95.3 fL (ref 80.0–100.0)
Platelets: 160 10*3/uL (ref 150–400)
RBC: 4.22 MIL/uL (ref 4.22–5.81)
RDW: 13.1 % (ref 11.5–15.5)
WBC: 10.3 10*3/uL (ref 4.0–10.5)
nRBC: 0 % (ref 0.0–0.2)

## 2022-02-04 LAB — BASIC METABOLIC PANEL
Anion gap: 13 (ref 5–15)
BUN: 31 mg/dL — ABNORMAL HIGH (ref 8–23)
CO2: 24 mmol/L (ref 22–32)
Calcium: 9.5 mg/dL (ref 8.9–10.3)
Chloride: 98 mmol/L (ref 98–111)
Creatinine, Ser: 1.4 mg/dL — ABNORMAL HIGH (ref 0.61–1.24)
GFR, Estimated: 50 mL/min — ABNORMAL LOW (ref >60)
Glucose, Bld: 163 mg/dL — ABNORMAL HIGH (ref 70–99)
Potassium: 3.7 mmol/L (ref 3.5–5.1)
Sodium: 135 mmol/L (ref 135–145)

## 2022-02-04 LAB — RESP PANEL BY RT-PCR (RSV, FLU A&B, COVID)  RVPGX2
Influenza A by PCR: NEGATIVE
Influenza B by PCR: NEGATIVE
Resp Syncytial Virus by PCR: NEGATIVE
SARS Coronavirus 2 by RT PCR: POSITIVE — AB

## 2022-02-04 LAB — TROPONIN I (HIGH SENSITIVITY)
Troponin I (High Sensitivity): 8 ng/L (ref <18)
Troponin I (High Sensitivity): 9 ng/L (ref <18)

## 2022-02-04 MED ORDER — HYDROCODONE BIT-HOMATROP MBR 5-1.5 MG/5ML PO SOLN: 5.0000 mL | Freq: Four times a day (QID) | ORAL | 0 refills | Status: DC | PRN

## 2022-02-04 MED ORDER — ONDANSETRON HCL 4 MG/2ML IJ SOLN
4.0000 mg | Freq: Once | INTRAMUSCULAR | Status: AC
  Administered 2022-02-04: 4 mg via INTRAVENOUS
  Filled 2022-02-04: qty 2

## 2022-02-04 MED ORDER — MOLNUPIRAVIR EUA 200MG CAPSULE: 4.0000 | ORAL_CAPSULE | Freq: Two times a day (BID) | ORAL | 0 refills | Status: AC

## 2022-02-04 MED ORDER — ALBUTEROL SULFATE HFA 108 (90 BASE) MCG/ACT IN AERS
2.0000 | INHALATION_SPRAY | Freq: Once | RESPIRATORY_TRACT | Status: AC
  Administered 2022-02-04: 2 via RESPIRATORY_TRACT
  Filled 2022-02-04: qty 6.7

## 2022-02-04 MED ORDER — ONDANSETRON 4 MG PO TBDP: 4.0000 mg | ORAL_TABLET | Freq: Four times a day (QID) | ORAL | 0 refills | Status: DC | PRN

## 2022-02-04 MED ORDER — ACETAMINOPHEN 500 MG PO TABS
1000.0000 mg | ORAL_TABLET | Freq: Once | ORAL | Status: AC
  Administered 2022-02-04: 1000 mg via ORAL
  Filled 2022-02-04: qty 2

## 2022-02-04 MED ORDER — AEROCHAMBER PLUS FLO-VU MEDIUM MISC
1.0000 | Freq: Once | Status: AC
  Administered 2022-02-04: 1
  Filled 2022-02-04: qty 1

## 2022-02-04 MED ORDER — LACTATED RINGERS IV BOLUS
500.0000 mL | Freq: Once | INTRAVENOUS | Status: AC
  Administered 2022-02-04: 500 mL via INTRAVENOUS

## 2022-02-04 NOTE — ED Triage Notes (Signed)
Pt states he started feeling bad with cough and nasal congestion a week ago. Pt was given prednisone and z-pack and felt better. Yesterday he started feeling worse. Pt was seen at [facility] today and diagnosed with covid. Pt was given cough medicine. Pt's family states pt started vomiting this evening and brought him in.  Pt states he feels tight in his chest and feels short of breath. Pt states he is not nauseated at this time.

## 2022-02-04 NOTE — ED Notes (Signed)
RT educated pt on proper use of MDI w/spacer. Pt able to perform w/out difficulty. Pt respiratory status stable w/no distress noted at this time.  

## 2022-02-04 NOTE — ED Provider Notes (Signed)
[facility] EMERGENC[facility] DEPT Provider Note   CSN: [id number] Arrival date & time: 02/04/22  1912     History  Chief Complaint  Patient presents with   Cough    Lance Dillon is a 82 y.o. male.  HPI Patient for got sick about a week ago.  He Nasal congestion and cough.  Patient was treated with prednisone and Z-Pak and he felt better.  He does have history of COPD.  However he does not use nebulizers at home.  Yesterday he felt bad again and spiked a fever.  He was diagnosed with COVID.  Patient reports that today he went to the urgent care and was given a cough medication and prednisone.  Once he got home, he was fever spiked up to 101 and he started coughing.  He got nauseated and then vomited just after he had tried the cough medication and the prednisone.  He did not take any Tylenol at home for the fever.  Reportedly they had discussed starting Paxlovid with the on-call pulmonologist who advised the patient to take it but was unable to prescribe it.  He came to the emergency department with fever, feeling worse and unable to start Paxlovid due to not being able to get the prescription from urgent care.    Home Medications Prior to Admission medications   Medication Sig Start Date End Date Taking? Authorizing Provider  HYDROcodone bit-homatropine (HYCODAN) 5-1.5 MG/5ML syrup Take 5 mLs by mouth every 6 (six) hours as needed for cough. 02/04/22  Yes Quiara Killian, Jeannie Done, MD  molnupiravir EUA (LAGEVRIO) 200 mg CAPS capsule Take 4 capsules (800 mg total) by mouth 2 (two) times daily for 5 days. 02/04/22 02/09/22 Yes Jamorian Dimaria, Jeannie Done, MD  ondansetron (ZOFRAN-ODT) 4 MG disintegrating tablet Take 1 tablet (4 mg total) by mouth every 6 (six) hours as needed for nausea or vomiting. 02/04/22  Yes Charlesetta Shanks, MD  acetaminophen (TYLENOL) 500 MG tablet Take 500 mg by mouth every 6 (six) hours as needed for mild pain or headache.    [provider]  amLODipine (NORVASC) 2.5 MG tablet  Take 1 tablet (2.5 mg total) by mouth at bedtime. 05/10/21 11/17/21  Adrian Prows, MD  aspirin (ASPIRIN CHILDRENS) 81 MG chewable tablet Chew 1 tablet (81 mg total) by mouth daily. 11/17/21   Adrian Prows, MD  azithromycin (ZITHROMAX Z-PAK) 250 MG tablet Take 2 tabs today, then 1 tab until gone 01/27/22   Tanda Rockers, MD  Calcium Carbonate-Vitamin D (CALTRATE 600+D PO) Take 1 tablet by mouth daily.     [provider]  docusate sodium (COLACE) 100 MG capsule Take 1 capsule (100 mg total) by mouth daily as needed for mild constipation. 12/25/20   Pokhrel, Corrie Mckusick, MD  fluticasone (FLONASE) 50 MCG/ACT nasal spray Place 2 sprays into both nostrils daily as needed for rhinitis or allergies (or nasal congestion). 09/02/12   [provider]  latanoprost (XALATAN) 0.005 % ophthalmic solution Place 1 drop into the left eye at bedtime.  09/18/13   [provider]  loratadine (CLARITIN) 10 MG tablet 1 tablet Orally Once a day as needed    [provider]  losartan-hydrochlorothiazide (HYZAAR) 100-12.5 MG tablet Take 1 tablet by mouth daily. 01/29/21   [provider]  LUTEIN PO Take 1 capsule by mouth daily.    [provider]  metoprolol succinate (TOPROL-XL) 25 MG 24 hr tablet TAKE 1 TABLET (25 MG TOTAL) BY MOUTH DAILY. ** DO NOT CRUSH ** (BETA BLOCKER)  08/08/21   Lorretta Harp, MD  Multiple Vitamin (MULTIVITAMIN WITH MINERALS) TABS tablet Take 1 tablet by mouth daily. 12/31/20   Regalado, Belkys A, MD  predniSONE (DELTASONE) 10 MG tablet Take 4 tabs for 2 days, then take 2 tabs for 2 days, then 1 tab for 2 days, then stop. 01/27/22   Tanda Rockers, MD  rivaroxaban (XARELTO) 2.5 MG TABS tablet Take 1 tablet (2.5 mg total) by mouth 2 (two) times daily. 11/17/21   Adrian Prows, MD  rosuvastatin (CRESTOR) 10 MG tablet TAKE 1 TABLET BY MOUTH EVERY DAY Patient taking differently: Take 10 mg by mouth at bedtime. 10/12/20   Lorretta Harp, MD  vitamin B-12  (CYANOCOBALAMIN) 500 MCG tablet Take 1 tablet (500 mcg total) by mouth daily. 12/31/20   Regalado, Cassie Freer, MD  Vitamin D, Ergocalciferol, (DRISDOL) 1.25 MG (50000 UNIT) CAPS capsule 1 capsule Orally once weekly for 84 days 09/01/21   [provider]      Allergies    Avelox [moxifloxacin hcl in nacl], Moxifloxacin, Cilostazol, Cyclobenzaprine, Quinolones, and Ramipril    Review of Systems   Review of Systems  Physical Exam Updated Vital Signs BP 119/74 (BP Location: Right Arm)   Pulse 100   Temp 98 F (36.7 C) (Oral)   Resp 18   Ht '5\' 8"'$  (1.727 m)   Wt 73 kg   SpO2 97%   BMI 24.48 kg/m  Physical Exam Constitutional:      Comments: Patient is alert and nontoxic.  No respiratory distress at rest.  No confusion.  Occasional harsh cough episodes.  HENT:     Head: Normocephalic and atraumatic.     Mouth/Throat:     Pharynx: Oropharynx is clear.  Eyes:     Extraocular Movements: Extraocular movements intact.  Cardiovascular:     Comments: Borderline tachycardia. Pulmonary:     Comments: No respiratory distress at rest.  Adequate airflow to the bases.  Occasional wheeze no rhonchi. Abdominal:     General: There is no distension.     Palpations: Abdomen is soft.     Tenderness: There is no abdominal tenderness. There is no guarding.  Musculoskeletal:        General: No swelling or tenderness. Normal range of motion.     Right lower leg: No edema.     Left lower leg: No edema.  Skin:    General: Skin is warm and dry.  Neurological:     General: No focal deficit present.     Mental Status: He is oriented to person, place, and time.     Motor: No weakness.     Coordination: Coordination normal.  Psychiatric:        Mood and Affect: Mood normal.     ED Results / Procedures / Treatments   Labs (all labs ordered are listed, but only abnormal results are displayed) Labs Reviewed  RESP PANEL BY RT-PCR (RSV, FLU A&B, COVID)  RVPGX2 - Abnormal; Notable for the  following components:      Result Value   SARS Coronavirus 2 by RT PCR POSITIVE (*)    All other components within normal limits  BASIC METABOLIC PANEL - Abnormal; Notable for the following components:   Glucose, Bld 163 (*)    BUN 31 (*)    Creatinine, Ser 1.40 (*)    GFR, Estimated 50 (*)    All other components within normal limits  CBC  TROPONIN I (HIGH SENSITIVITY)  TROPONIN I (HIGH  SENSITIVITY)    EKG EKG Interpretation  Date/Time:  Saturday February 04 2022 19:35:41 EST Ventricular Rate:  114 PR Interval:  188 QRS Duration: 72 QT Interval:  314 QTC Calculation: 432 R Axis:   -9 Text Interpretation: Undetermined rhythm ST & T wave abnormality, consider inferior ischemia ST & T wave abnormality, consider anterolateral ischemia Abnormal ECG When compared with ECG of 15-Dec-2013 21:46, PREVIOUS ECG IS PRESENT increased rate and artifact but similar to previous Confirmed by Charlesetta Shanks [phone]) on 02/04/2022 9:22:17 PM  Radiology DG Chest Port 1 View  Result Date: 02/04/2022 CLINICAL DATA:  COVID positive EXAM: PORTABLE CHEST 1 VIEW COMPARISON:  CT chest 08/27/2021 FINDINGS: Stable cardiomediastinal silhouette. Aortic atherosclerotic calcification. Bibasilar atelectasis/scarring. Chronic bronchitic changes. Hyperinflation. No pleural effusion or pneumothorax. IMPRESSION: No focal pneumonia.  COPD. Electronically Signed   By: Placido Sou M.D.   On: 02/04/2022 20:38    Procedures Procedures    Medications Ordered in ED Medications  lactated ringers bolus 500 mL (0 mLs Intravenous Stopped 02/04/22 2205)  acetaminophen (TYLENOL) tablet 1,000 mg (1,000 mg Oral Given 02/04/22 2147)  albuterol (VENTOLIN HFA) 108 (90 Base) MCG/ACT inhaler 2 puff (2 puffs Inhalation Given 02/04/22 2216)  AeroChamber Plus Flo-Vu Medium MISC 1 each (1 each Other Given 02/04/22 2216)  ondansetron (ZOFRAN) injection 4 mg (4 mg Intravenous Given 02/04/22 2147)    ED Course/ Medical Decision Making/ A&P                            Medical Decision Making Amount and/or Complexity of Data Reviewed Labs: ordered. Radiology: ordered.  Risk OTC drugs. Prescription drug management.   Patient presents with a known diagnosis of COVID.  He also has associated COPD.  He presents tonight with development of fever and an episode of vomiting at home.  Clinically the patient is alert with no confusion.  He does not have respiratory distress.  He does feel fatigued and generally not well.  Will proceed with basic lab work and chest x-ray.  Lab suggest possible mild dehydration with GFR at 50, baseline appears to be 58.  Otherwise no leukocytosis.  Chest x-ray is clear.  EKG no acute changes.  Patient rehydrated with 500 mL LR.  Patient is given an albuterol inhaler with spacer teaching.  He is given acetaminophen for body aches and fever and Zofran for nausea.  Upon recheck patient feels much improved.  He is alert and in no distress.  I extensively reviewed plan with patient's family.  Patient will be started on molnupiravir.  I reviewed with pharmacy contraindications with drug drug interactions with Paxlovid.  I have extensively reviewed management of symptoms at home with Tylenol and and just use of Hycodan syrup.  Albuterol MDI dispensed with teaching.  Return precautions reviewed.        Final Clinical Impression(s) / ED Diagnoses Final diagnoses:  COVID  Chronic obstructive pulmonary disease, unspecified COPD type ([place])    Rx / DC Orders ED Discharge Orders          Ordered    molnupiravir EUA (LAGEVRIO) 200 mg CAPS capsule  2 times daily        02/04/22 2301    HYDROcodone bit-homatropine (HYCODAN) 5-1.5 MG/5ML syrup  Every 6 hours PRN        02/04/22 2301    ondansetron (ZOFRAN-ODT) 4 MG disintegrating tablet  Every 6 hours PRN        02/04/22  [place], Christain Mcraney, MD 02/04/22 2309

## 2022-02-04 NOTE — Discharge Instructions (Signed)
1.  You have COVID.  You have suggested to control your fever and symptoms of achiness or headache with Tylenol.  You may take regular strength Tylenol, 650 mg every 4 hours or, extra strength Tylenol 1000 mg every 6 hours. 2.  Start molnupiravir tonight.  Take as directed. 3.  You have been given an albuterol inhaler to help with the wheezing and coughing.  Coughing is often triggered by constriction of the bronchioles and wheezing.  Take 2 puffs of the inhaler every 4-6 hours as needed. 4.  You have a prescription from urgent care for prednisone.  Start that tomorrow. 5.  You have been given a prescription for Hycodan syrup.  This contains a narcotic medication called hydrocodone.  You may take half to 1 teaspoon every 4-6 hours for cough.  This medication may cause sedation, confusion or constipation.  Observe how you respond to it.  If you tolerate it well use as needed, if you find that you are off balance or feel confused or disoriented taking medication, discontinue it. 6.  Call your pulmonologist for a follow-up check within the next 3 to 5 days. 7.  Return to the emergency department if you are getting any increasing difficulty breathing, confusion, dehydration or any other concerning changes.

## 2022-02-08 ENCOUNTER — Telehealth: Payer: Self-pay

## 2022-02-08 NOTE — Telephone Encounter (Signed)
     Patient  visit on 02/04/2022  at [facility] was for [place].  Have you been able to follow up with your primary care physician? Patient has appointment scheduled 02/09/2022.  The patient was or was not able to obtain any needed medicine or equipment. Patient obtained medication. Gave patient's wife information for Medicare Extrahelp.  Are there diet recommendations that you are having difficulty following?No  Patient expresses understanding of discharge instructions and education provided has no other needs at this time.    [place] [facility] Care Guide   [email or web address]  ?? [phone]   Website: [email or web address]  [email or web address]

## 2022-02-09 DIAGNOSIS — U071 COVID-19: Secondary | ICD-10-CM | POA: Diagnosis not present

## 2022-02-10 ENCOUNTER — Encounter: Payer: Self-pay | Admitting: Nurse Practitioner

## 2022-02-10 ENCOUNTER — Ambulatory Visit: Payer: Medicare HMO | Admitting: Nurse Practitioner

## 2022-02-10 VITALS — BP 110/70 | HR 80 | Ht 67.0 in | Wt 168.8 lb

## 2022-02-10 DIAGNOSIS — J449 Chronic obstructive pulmonary disease, unspecified: Secondary | ICD-10-CM

## 2022-02-10 DIAGNOSIS — U071 COVID-19: Secondary | ICD-10-CM | POA: Diagnosis not present

## 2022-02-10 DIAGNOSIS — J8410 Pulmonary fibrosis, unspecified: Secondary | ICD-10-CM

## 2022-02-10 HISTORY — DX: COVID-19: U07.1

## 2022-02-10 MED ORDER — ALBUTEROL SULFATE HFA 108 (90 BASE) MCG/ACT IN AERS: 2.0000 | INHALATION_SPRAY | Freq: Four times a day (QID) | RESPIRATORY_TRACT | 2 refills | Status: DC | PRN

## 2022-02-10 NOTE — Assessment & Plan Note (Signed)
Previously on Symbicort. Weaned himself off. He did restart the Symbicort when he initially tested positive for COVID; has since stopped it again. He's not sure that it makes a difference. Again reviewed role of maintenance therapy. He would like to refrain at this time. COPD/asthma action plan in place. Provided with new rescue inhaler. Up to date on vaccines. Encouraged activity as tolerated.

## 2022-02-10 NOTE — Assessment & Plan Note (Signed)
HRCT without evidence of ILD. Appearance consistent with postinflammatory scarring. No significant dyspnea at baseline. He has a chronic cough, which was overall improved prior to recent COVID illness. Continue monitoring.

## 2022-02-10 NOTE — Patient Instructions (Addendum)
Continue flonase 2 sprays each nostril daily Continue loratadine 1 tab daily Continue Albuterol inhaler 2 puffs very 6 hours as needed for shortness of breath or wheezing.  Ok to stay off Symbicort unless you have increased shortness of breath, wheezing, or cough then restart 2 puffs Twice daily and let us know. Brush tongue and rinse mouth afterwards  Follow up with Dr. Melvyn Novas in 3 months. Reschedule upcoming appt. If symptoms worsen, please contact office for sooner follow up or seek emergency care.

## 2022-02-10 NOTE — Assessment & Plan Note (Signed)
Positive 1/5. Completed antiviral course. Clinically improved. Residual cough - continue cough control measures. He will notify us if cough does continue to improve or he develops new/worsening symptoms.  Patient Instructions  Continue flonase 2 sprays each nostril daily Continue loratadine 1 tab daily Continue Albuterol inhaler 2 puffs very 6 hours as needed for shortness of breath or wheezing.  Ok to stay off Symbicort unless you have increased shortness of breath, wheezing, or cough then restart 2 puffs Twice daily and let us know. Brush tongue and rinse mouth afterwards  Follow up with Dr. Melvyn Novas in 3 months. Reschedule upcoming appt. If symptoms worsen, please contact office for sooner follow up or seek emergency care.

## 2022-02-10 NOTE — Progress Notes (Signed)
$'@Patient'D$  ID: Lance Dillon, male    DOB: [birth year], 82 y.o.   MRN: [id number]  Chief Complaint  Patient presents with   Follow-up    Referring provider: Deland Pretty, MD  HPI: 82 year old male, former smoker followed for COPD/asthma overlap and postinflammatory fibrosis. He is a patient of Dr. Gustavus Bryant and last seen in office 08/22/2021. Past medical history HTN, PVD, CKD stage III, HLD.   TEST/EVENTS:  08/27/2021 HRCT chest: atherosclerosis. Prominent atherosclerosis. No LAD. No ILD. Postinflammatory scarring. Diffuse bronchial wall thickening with moderate emphysema.  07/19/2017 spirometry: FVC 2.58, FEV1 1.68, ratio 65  08/22/2021: OV with Dr. Melvyn Novas. Follow for COPD/asbestosis. [place] more difficult but still able to make it back to the house without stopping. Last exposure to asbestos was 1980's. HRCT ordered for further evaluation. Ok to d/c Symbicort if no worse. Discussed role of LABA/LAMA; not interested at this time. F/u 6 months.   02/10/2022: Today - follow up Patient presents today for hospital follow up. He had called the office around 12/29 with nasal symptoms and cough. He was treated with z pack and prednisone and felt better. He then started feeling bad again on 1/5 with new fever. He tested positive at home for COVID; went to urgent care and was prescribed cough medicine and prednisone. After he got home, his fever went back to 101 and he started coughing so much he had post tussive emesis. He was seen in the ED 02/04/2022. He was treated with IV fluids and antiemetics. He was discharged home with molnupiravir.  Today, he tells me that he is feeling much better. Completed his antiviral course. He feels mostly back to his baseline; no further fevers, nausea, or vomiting. His cough is improving; mostly occurs at night and is dry or only produces small amount of clear phlegm. He will use the cough syrup before bed, which helps; otherwise, he doesn't have to take anything throughout the  day. No shortness of breath, wheezing, chest congestion, or persistent sinus symptoms. He is not on any maintenance inhaler regimen.    Allergies  Allergen Reactions   Avelox [Moxifloxacin Hcl In Nacl] Diarrhea   Moxifloxacin Diarrhea   Cilostazol Diarrhea   Cyclobenzaprine Other (See Comments)   Quinolones     Other reaction(s): Unknown   Ramipril     Other reaction(s): Unknown    Immunization History  Administered Date(s) Administered   DTaP 10/17/2010   Influenza, High Dose Seasonal PF 11/13/2014, 10/19/2015, 10/23/2018   Influenza, Quadrivalent, Recombinant, Inj, Pf 11/07/2016, 10/18/2017, 10/23/2018, 10/24/2019, 11/02/2020   Influenza,inj,quad, With Preservative 10/30/2016   Influenza-Unspecified 10/17/2010, 11/24/2011, 11/08/2012, 12/03/2013, 10/31/2014   PFIZER(Purple Top)SARS-COV-2 Vaccination 02/20/2019, 03/13/2019, 12/01/2019   PNEUMOCOCCAL CONJUGATE-20 05/02/2021   Pneumococcal Conjugate-13 05/28/2013   Pneumococcal Polysaccharide-23 01/20/2009, 06/12/2014   Pneumococcal-Unspecified 01/20/2009, 11/05/2013, 06/12/2014   Td 08/16/2020   Tdap 10/17/2010, 08/16/2020   Zoster Recombinat (Shingrix) 11/19/2017, 02/11/2018   Zoster, Live 09/20/2009   Zoster, Unspecified 09/20/2009    Past Medical History:  Diagnosis Date   Anemia    Arthritis    Atherosclerosis of aortic bifurcation and common iliac arteries ([place]) 03/2015   Noted on abdominal/renal Dopplers   Blind right eye    Carotid artery, internal, 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   Bilateral: Noted on MRA of brain in November 2015 with collateral flow from posterior circulation and external collaterals reconstituting anterior circulation   Chronic kidney disease    Colon polyps 2004, 07, 14   COPD (chronic obstructive pulmonary disease) ([place])  GERD (gastroesophageal reflux disease)    diet controlled, no meds   Glaucoma    left eye    Hx of radiation therapy 03/22/11 to 05/08/11   PSA recurrent carcinoma of prostate    Hypercholesterolemia    Hypertension    Macular degeneration of both eyes    Peripheral vascular disease ([place]) 07/2011   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) 2004   Renal artery stenosis ([facility])    70% left   Stones in the urinary tract    Stroke ([place])    Urinary incontinence     Tobacco History: Social History   Tobacco Use  Smoking Status Former   Packs/day: 3.00   Years: 30.00   Total pack years: 90.00   Types: Cigarettes   Quit date: 07/01/2002   Years since quitting: 19.6  Smokeless Tobacco Never   Counseling given: Not Answered   Outpatient Medications Prior to Visit  Medication Sig Dispense Refill   acetaminophen (TYLENOL) 500 MG tablet Take 500 mg by mouth every 6 (six) hours as needed for mild pain or headache.     aspirin (ASPIRIN CHILDRENS) 81 MG chewable tablet Chew 1 tablet (81 mg total) by mouth daily.     azithromycin (ZITHROMAX Z-PAK) 250 MG tablet Take 2 tabs today, then 1 tab until gone 6 each 0   Calcium Carbonate-Vitamin D (CALTRATE 600+D PO) Take 1 tablet by mouth daily.      docusate sodium (COLACE) 100 MG capsule Take 1 capsule (100 mg total) by mouth daily as needed for mild constipation.  0   fluticasone (FLONASE) 50 MCG/ACT nasal spray Place 2 sprays into both nostrils daily as needed for rhinitis or allergies (or nasal congestion).     HYDROcodone bit-homatropine (HYCODAN) 5-1.5 MG/5ML syrup Take 5 mLs by mouth every 6 (six) hours as needed for cough. 120 mL 0   latanoprost (XALATAN) 0.005 % ophthalmic solution Place 1 drop into the left eye at bedtime.      loratadine (CLARITIN) 10 MG tablet 1 tablet Orally Once a day as needed     losartan-hydrochlorothiazide (HYZAAR) 100-12.5 MG tablet Take 1 tablet by mouth daily.     LUTEIN PO Take 1 capsule by mouth daily.     metoprolol succinate  (TOPROL-XL) 25 MG 24 hr tablet TAKE 1 TABLET (25 MG TOTAL) BY MOUTH DAILY. ** DO NOT CRUSH ** (BETA BLOCKER) 90 tablet 3   Multiple Vitamin (MULTIVITAMIN WITH MINERALS) TABS tablet Take 1 tablet by mouth daily. 30 tablet 0   ondansetron (ZOFRAN-ODT) 4 MG disintegrating tablet Take 1 tablet (4 mg total) by mouth every 6 (six) hours as needed for nausea or vomiting. 20 tablet 0   predniSONE (DELTASONE) 10 MG tablet Take 4 tabs for 2 days, then take 2 tabs for 2 days, then 1 tab for 2 days, then stop. 20 tablet 0   rivaroxaban (XARELTO) 2.5 MG TABS tablet Take 1 tablet (2.5 mg total) by mouth 2 (two) times daily. 60 tablet 3   rosuvastatin (CRESTOR) 10 MG tablet TAKE 1 TABLET BY MOUTH EVERY DAY (Patient taking differently: Take 10 mg by mouth at bedtime.) 90 tablet 3   vitamin B-12 (CYANOCOBALAMIN) 500 MCG tablet Take 1 tablet (500 mcg total) by mouth daily. 30 tablet 0   Vitamin D, Ergocalciferol, (DRISDOL) 1.25 MG (50000 UNIT)  CAPS capsule 1 capsule Orally once weekly for 84 days     amLODipine (NORVASC) 2.5 MG tablet Take 1 tablet (2.5 mg total) by mouth at bedtime. 90 tablet 2   No facility-administered medications prior to visit.     Review of Systems:   Constitutional: No weight loss or gain, night sweats, fevers, chills, fatigue, or lassitude. HEENT: No headaches, difficulty swallowing, tooth/dental problems, or sore throat. No sneezing, itching, ear ache, nasal congestion, or post nasal drip CV:  No chest pain, orthopnea, PND, swelling in lower extremities, anasarca, dizziness, palpitations, syncope Resp: +cough (improving). No shortness of breath with exertion or at rest. No excess mucus or change in color of mucus. No productive or non-productive. No hemoptysis. No wheezing.  No chest wall deformity GI:  No heartburn, indigestion, abdominal pain, nausea, vomiting, loss of appetite GU: No dysuria, change in color of urine, urgency or frequency.   Skin: No rash, lesions, ulcerations MSK:   No joint pain or swelling.   Neuro: No dizziness or lightheadedness.  Psych: No depression or anxiety. Mood stable.     Physical Exam:  BP 110/70 (BP Location: Right Arm)   Pulse 80   Ht '5\' 7"'$  (1.702 m)   Wt 168 lb 12.8 oz (76.6 kg)   SpO2 96%   BMI 26.44 kg/m   GEN: Pleasant, interactive, well-appearing; in no acute distress. HEENT:  Normocephalic and atraumatic. PERRLA. Sclera white. Nasal turbinates pink, moist and patent bilaterally. No rhinorrhea present. Oropharynx pink and moist, without exudate or edema. No lesions, ulcerations, or postnasal drip.  NECK:  Supple w/ fair ROM. No JVD present. Normal carotid impulses w/o bruits. Thyroid symmetrical with no goiter or nodules palpated. No lymphadenopathy.   CV: RRR, no m/r/g, no peripheral edema. Pulses intact, +2 bilaterally. No cyanosis, pallor or clubbing. PULMONARY:  Unlabored, regular breathing. Clear bilaterally A&P w/o wheezes/rales/rhonchi. No accessory muscle use.  GI: BS present and normoactive. Soft, non-tender to palpation. No organomegaly or masses detected. MSK: No erythema, warmth or tenderness. Cap refil <2 sec all extrem. No deformities or joint swelling noted.  Neuro: A/Ox3. No focal deficits noted.   Skin: Warm, no lesions or rashe Psych: Normal affect and behavior. Judgement and thought content appropriate.     Lab Results:  CBC    Component Value Date/Time   WBC 10.3 02/04/2022 1933   RBC 4.22 02/04/2022 1933   HGB 14.1 02/04/2022 1933   HCT 40.2 02/04/2022 1933   PLT 160 02/04/2022 1933   MCV 95.3 02/04/2022 1933   MCH 33.4 02/04/2022 1933   MCHC 35.1 02/04/2022 1933   RDW 13.1 02/04/2022 1933   LYMPHSABS 1.6 12/19/2020 1128   MONOABS 0.8 12/19/2020 1128   EOSABS 0.2 12/19/2020 1128   BASOSABS 0.1 12/19/2020 1128    BMET    Component Value Date/Time   NA 135 02/04/2022 1933   NA 141 10/07/2021 1310   K 3.7 02/04/2022 1933   CL 98 02/04/2022 1933   CO2 24 02/04/2022 1933   GLUCOSE 163  (H) 02/04/2022 1933   BUN 31 (H) 02/04/2022 1933   BUN 28 (H) 10/07/2021 1310   CREATININE 1.40 (H) 02/04/2022 1933   CALCIUM 9.5 02/04/2022 1933   GFRNONAA 50 (L) 02/04/2022 1933   GFRAA 51 (L) 11/25/2015 1210    BNP No results found for: "BNP"   Imaging:  DG Chest Port 1 View  Result Date: 02/04/2022 CLINICAL DATA:  COVID positive EXAM: PORTABLE CHEST 1 VIEW COMPARISON:  CT  chest 08/27/2021 FINDINGS: Stable cardiomediastinal silhouette. Aortic atherosclerotic calcification. Bibasilar atelectasis/scarring. Chronic bronchitic changes. Hyperinflation. No pleural effusion or pneumothorax. IMPRESSION: No focal pneumonia.  COPD. Electronically Signed   By: Placido Sou M.D.   On: 02/04/2022 20:38          No data to display          No results found for: "NITRICOXIDE"      Assessment & Plan:   COVID-19 virus infection Positive 1/5. Completed antiviral course. Clinically improved. Residual cough - continue cough control measures. He will notify us if cough does continue to improve or he develops new/worsening symptoms.  Patient Instructions  Continue flonase 2 sprays each nostril daily Continue loratadine 1 tab daily Continue Albuterol inhaler 2 puffs very 6 hours as needed for shortness of breath or wheezing.  Ok to stay off Symbicort unless you have increased shortness of breath, wheezing, or cough then restart 2 puffs Twice daily and let us know. Brush tongue and rinse mouth afterwards  Follow up with Dr. Melvyn Novas in 3 months. Reschedule upcoming appt. If symptoms worsen, please contact office for sooner follow up or seek emergency care.    COPD GOLD II   Previously on Symbicort. Weaned himself off. He did restart the Symbicort when he initially tested positive for COVID; has since stopped it again. He's not sure that it makes a difference. Again reviewed role of maintenance therapy. He would like to refrain at this time. COPD/asthma action plan in place. Provided with  new rescue inhaler. Up to date on vaccines. Encouraged activity as tolerated.  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 Appearance consistent with postinflammatory scarring. No significant dyspnea at baseline. He has a chronic cough, which was overall improved prior to recent COVID illness. Continue monitoring.    I spent 35 minutes of dedicated to the care of this patient on the date of this encounter to include pre-visit review of records, face-to-face time with the patient discussing conditions above, post visit ordering of testing, clinical documentation with the electronic health record, making appropriate referrals as documented, and communicating necessary findings to members of the patients care team.  Clayton Bibles, NP 02/10/2022  Pt aware and understands NP's role.

## 2022-02-22 ENCOUNTER — Ambulatory Visit: Payer: Medicare HMO | Admitting: Internal Medicine

## 2022-03-01 DIAGNOSIS — I739 Peripheral vascular disease, unspecified: Secondary | ICD-10-CM | POA: Diagnosis not present

## 2022-03-01 DIAGNOSIS — E785 Hyperlipidemia, unspecified: Secondary | ICD-10-CM | POA: Diagnosis not present

## 2022-03-01 DIAGNOSIS — I10 Essential (primary) hypertension: Secondary | ICD-10-CM | POA: Diagnosis not present

## 2022-03-01 DIAGNOSIS — M810 Age-related osteoporosis without current pathological fracture: Secondary | ICD-10-CM | POA: Diagnosis not present

## 2022-03-07 DIAGNOSIS — H938X3 Other specified disorders of ear, bilateral: Secondary | ICD-10-CM | POA: Diagnosis not present

## 2022-03-07 DIAGNOSIS — R0981 Nasal congestion: Secondary | ICD-10-CM | POA: Diagnosis not present

## 2022-03-08 ENCOUNTER — Telehealth (HOSPITAL_COMMUNITY): Payer: Self-pay

## 2022-03-08 NOTE — Telephone Encounter (Signed)
Pt's wife called bc pt has been having frequent headaches. She isn't sure if it is related to his aneurysm or sinuses. He went to go see his pcp and they believe it is sinus related. They put him on some antibiotics to clear it up. If he is still having headaches in a couple weeks and it isn't sinus related we will schedule him a f/u mri sooner per Dr. Estanislado Pandy. Pt's wife agreed. AB

## 2022-04-05 ENCOUNTER — Other Ambulatory Visit: Payer: Self-pay | Admitting: Cardiology

## 2022-04-05 DIAGNOSIS — I70222 Atherosclerosis of native arteries of extremities with rest pain, left leg: Secondary | ICD-10-CM

## 2022-04-05 DIAGNOSIS — I70213 Atherosclerosis of native arteries of extremities with intermittent claudication, bilateral legs: Secondary | ICD-10-CM

## 2022-04-05 DIAGNOSIS — I739 Peripheral vascular disease, unspecified: Secondary | ICD-10-CM

## 2022-04-21 DIAGNOSIS — Z961 Presence of intraocular lens: Secondary | ICD-10-CM | POA: Diagnosis not present

## 2022-04-21 DIAGNOSIS — H401121 Primary open-angle glaucoma, left eye, mild stage: Secondary | ICD-10-CM | POA: Diagnosis not present

## 2022-04-30 DIAGNOSIS — I739 Peripheral vascular disease, unspecified: Secondary | ICD-10-CM | POA: Diagnosis not present

## 2022-04-30 DIAGNOSIS — I10 Essential (primary) hypertension: Secondary | ICD-10-CM | POA: Diagnosis not present

## 2022-04-30 DIAGNOSIS — E785 Hyperlipidemia, unspecified: Secondary | ICD-10-CM | POA: Diagnosis not present

## 2022-04-30 DIAGNOSIS — M810 Age-related osteoporosis without current pathological fracture: Secondary | ICD-10-CM | POA: Diagnosis not present

## 2022-05-08 ENCOUNTER — Other Ambulatory Visit: Payer: Medicare HMO

## 2022-05-11 NOTE — Progress Notes (Signed)
I, Lance Dillon, CMA acting as a scribe for Lance Graham, MD.  Lance Dillon is a 82 y.o. male who presents to [facility] today for cont'd chronic bilat LBP. On 11/22/2021, he was given a R SI joint steroid injection. Pt was last seen by Dr. Denyse Amass on 12/08/21 and a lumbar ESI was ordered and later performed on 12/19/21.  Today, pt reports worsening bilat hip pain over the past 2-3 weeks. ESI were helpful for low back sx but seem to be wearing off. Occasional radiating pain into the left leg down to the ankle at times. Less severe sx in the right leg. Left leg sx can be debilitating at times.   He has had benefit in the past from right SI joint injection.  Additionally he has lateral hip pain at the greater trochanter.  The most recent epidural steroid injection in November was beneficial.  Dx imaging: 09/27/21 L-spine MRI 08/24/21 L-spine & pelvis XR  Pertinent review of systems: No fevers or chills  Relevant historical information: COPD.  Chronic back pain.   Exam:  BP 116/78   Pulse 65   Ht 5\' 7"  (1.702 m)   Wt 171 lb 14.4 oz (78 kg)   SpO2 95%   BMI 26.92 kg/m  General: Well Developed, well nourished, and in no acute distress.   MSK: L-spine: Tender palpation right SI joint.  Decreased lumbar motion.  Left hip: Normal appearing.  Normal motion.  Tender palpation greater trochanter.    Lab and Radiology Results  Procedure: Real-time Ultrasound Guided Injection of right SI joint Device: Philips Affiniti 50G Images permanently stored and available for review in PACS Verbal informed consent obtained.  Discussed risks and benefits of procedure. Warned about infection, bleeding, hyperglycemia damage to structures among others. Patient expresses understanding and agreement Time-out conducted.   Noted no overlying erythema, induration, or other signs of local infection.   Skin prepped in a sterile fashion.   Local anesthesia: Topical Ethyl  chloride.   With sterile technique and under real time ultrasound guidance: 40 mg of Kenalog and 2 mL of Marcaine injected into right SI joint. Fluid seen entering the joint capsule.   Completed without difficulty   Pain immediately resolved suggesting accurate placement of the medication.   Advised to call if fevers/chills, erythema, induration, drainage, or persistent bleeding.   Images permanently stored and available for review in the ultrasound unit.  Impression: Technically successful ultrasound guided injection.    Procedure: Real-time Ultrasound Guided Injection of left hip greater trochanter bursa Device: Philips Affiniti 50G Images permanently stored and available for review in PACS Verbal informed consent obtained.  Discussed risks and benefits of procedure. Warned about infection, bleeding, hyperglycemia damage to structures among others. Patient expresses understanding and agreement Time-out conducted.   Noted no overlying erythema, induration, or other signs of local infection.   Skin prepped in a sterile fashion.   Local anesthesia: Topical Ethyl chloride.   With sterile technique and under real time ultrasound guidance: 40 mg of Kenalog and 2 mL Marcaine injected into greater trochanter bursa. Fluid seen entering the bursa.   Completed without difficulty   Pain immediately resolved suggesting accurate placement of the medication.   Advised to call if fevers/chills, erythema, induration, drainage, or persistent bleeding.   Images permanently stored and available for review in the ultrasound unit.  Impression: Technically successful ultrasound guided injection.        Assessment and Plan: 82 y.o. male  with chronic low back pain and lateral hip pain.  Pain is multifactorial.  Right SI joint: A lot of his pain seems to be SI joint related.  He had good benefit from prior SI joint injection.  Will repeat that procedure today.  He also has pain in the lateral hip  thought to be trochanteric bursitis.  Will treat with greater trochanter injection as well today.  Some of his pain is axial back pain with some radicular symptoms.  He had good benefit in the past from interlaminar epidural steroid injection at L3-4 last November.  That was so far the most effective back injection he has had.  Will go ahead and repeat that as well in the near future.   PDMP not reviewed this encounter. Orders Placed This Encounter  Procedures   [place] LIMITED JOINT SPACE STRUCTURES LOW BILAT(NO LINKED CHARGES)    Order Specific Question:   Reason for Exam (SYMPTOM  OR DIAGNOSIS REQUIRED)    Answer:   bilat hip/SI joint pain    Order Specific Question:   Preferred imaging location?    Answer:   Adult nurse Sports Medicine[facility] DIAG/THERA/INC NEEDLE/CATH/PLC EPI/LUMB/SAC W/IMG    Level and technique per radiology LUMB EPI #2 [employer] PACS (09/27/21) *ASA 81 MG XARELTO* *PREV HIP INJ 05/11/22* 05/12/22 FAXED [facility] LETTER*    Standing Status:   Future    Standing Expiration Date:   06/11/2022    Order Specific Question:   Reason for Exam (SYMPTOM  OR DIAGNOSIS REQUIRED)    Answer:   Low back pain    Order Specific Question:   Preferred Imaging Location?    Answer:   [id number]   No orders of the defined types were placed in this encounter.    Discussed warning signs or symptoms. Please see discharge instructions. Patient expresses understanding.   The above documentation has been reviewed and is accurate and complete Lance Dillon, M.D.

## 2022-05-12 ENCOUNTER — Other Ambulatory Visit: Payer: Self-pay

## 2022-05-12 ENCOUNTER — Ambulatory Visit: Payer: Medicare HMO | Admitting: Family Medicine

## 2022-05-12 VITALS — BP 116/78 | HR 65 | Ht 67.0 in | Wt 171.9 lb

## 2022-05-12 DIAGNOSIS — M25552 Pain in left hip: Secondary | ICD-10-CM

## 2022-05-12 DIAGNOSIS — M545 Low back pain, unspecified: Secondary | ICD-10-CM

## 2022-05-12 DIAGNOSIS — M5416 Radiculopathy, lumbar region: Secondary | ICD-10-CM | POA: Diagnosis not present

## 2022-05-12 DIAGNOSIS — M25551 Pain in right hip: Secondary | ICD-10-CM | POA: Diagnosis not present

## 2022-05-12 DIAGNOSIS — G8929 Other chronic pain: Secondary | ICD-10-CM

## 2022-05-12 DIAGNOSIS — M533 Sacrococcygeal disorders, not elsewhere classified: Secondary | ICD-10-CM

## 2022-05-12 NOTE — Patient Instructions (Signed)
Thank you for coming in today.   You received an injection today. Seek immediate medical attention if the joint becomes red, extremely painful, or is oozing fluid.   Please call [facility] at [phone] to schedule your spine injection.

## 2022-05-15 ENCOUNTER — Ambulatory Visit: Payer: Medicare HMO | Admitting: Internal Medicine

## 2022-05-15 ENCOUNTER — Ambulatory Visit: Payer: Medicare HMO

## 2022-05-15 DIAGNOSIS — I77810 Thoracic aortic ectasia: Secondary | ICD-10-CM

## 2022-05-15 DIAGNOSIS — I7121 Aneurysm of the ascending aorta, without rupture: Secondary | ICD-10-CM

## 2022-05-18 ENCOUNTER — Encounter: Payer: Self-pay | Admitting: Cardiology

## 2022-05-18 ENCOUNTER — Ambulatory Visit: Payer: Medicare HMO | Admitting: Cardiology

## 2022-05-18 VITALS — BP 136/70 | HR 71 | Ht 67.0 in | Wt 168.0 lb

## 2022-05-18 DIAGNOSIS — I6521 Occlusion and stenosis of right carotid artery: Secondary | ICD-10-CM

## 2022-05-18 DIAGNOSIS — I70213 Atherosclerosis of native arteries of extremities with intermittent claudication, bilateral legs: Secondary | ICD-10-CM | POA: Diagnosis not present

## 2022-05-18 DIAGNOSIS — I10 Essential (primary) hypertension: Secondary | ICD-10-CM | POA: Diagnosis not present

## 2022-05-18 DIAGNOSIS — I7121 Aneurysm of the ascending aorta, without rupture: Secondary | ICD-10-CM | POA: Diagnosis not present

## 2022-05-18 NOTE — Progress Notes (Signed)
Primary Physician/Referring:  Merri Brunette, MD  Patient ID: Lance Dillon, male    DOB: 04-Sep-1940, 82 y.o.   MRN: [id number]  Chief Complaint  Patient presents with   Atherosclerosis of native artery of both lower extremities    Follow-up   HPI:    Lance Dillon  is a 82 y.o. 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   Past medical history significant for hypertension, hyperlipidemia, chronic stage IIIa creatinine disease, chronic anemia and remote tobacco use disorder with a 90-pack-year history quit in 2004.  Patient presents here for a 82-month office visit, continues to have very mild left leg claudication.  He has not had any skin breakdown, no rest pain. He denies chest pain, dyspnea.  States that he still is working part-time and works on Network engineer. 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   Bilateral: Noted on MRA of brain in November 2015 with collateral flow from posterior circulation and external collaterals reconstituting anterior circulation   Chronic kidney disease    Colon polyps 2004, 07, 14   COPD (chronic obstructive pulmonary disease)    GERD (gastroesophageal reflux disease)    diet controlled, no meds   Glaucoma    left eye    Hx of radiation therapy 03/22/11 to 05/08/11   PSA recurrent carcinoma of prostate   Hypercholesterolemia    Hypertension    Macular  degeneration of both eyes    Peripheral vascular disease 07/2011   Bilateral SFA stenosis: Status post right femoropopliteal bypass - Dr. 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 Negative for chest pain, dyspnea on exertion and leg swelling.   Objective  Blood pressure 136/70, pulse 71, height 5\' 7"  (1.702 m), weight 168 lb (76.2 kg), SpO2 97 %. Body mass index is 26.31 kg/m.     05/18/2022    9:01 AM 05/12/2022    9:44 AM 02/10/2022    8:38 AM  Vitals with BMI  Height 5\' 7"  5\' 7"  5\' 7"   Weight 168 lbs 171 lbs 14 oz 168 lbs 13 oz  BMI 26.31 26.92 26.43  Systolic 136 116 409  Diastolic 70  78 70  Pulse 71 65 80    Physical Exam Neck:     Vascular: No carotid bruit or JVD.  Cardiovascular:     Rate and Rhythm: Normal rate and regular rhythm.     Pulses: Intact distal pulses.          Carotid pulses are 2+ on the right side and 2+ on the left side.      Femoral pulses are 2+ on the right side and 1+ on the left side.      Popliteal pulses are 2+ on the right side and 0 on the left side.       Dorsalis pedis pulses are 2+ on the right side and 0 on the left side.       Posterior tibial pulses are 0 on the right side and 0 on the left side.     Heart sounds: Normal heart sounds. No murmur heard.    No gallop.  Pulmonary:     Effort: Pulmonary effort is normal.     Breath sounds: Normal breath  sounds.  Abdominal:     General: Bowel sounds are normal.     Palpations: Abdomen is soft.  Musculoskeletal:     Right lower leg: No edema.     Left lower leg: No edema.     Laboratory examination:   Recent Labs    10/07/21 1310 02/04/22 1933  NA 141 135  K 4.3 3.7  CL 98 98  CO2 25 24  GLUCOSE 85 163*  BUN 28* 31*  CREATININE 1.25 1.40*  CALCIUM 10.2 9.5  GFRNONAA  --  50*   Lab Results  Component Value Date   ALT 15 12/19/2020   AST 16 12/19/2020   ALKPHOS 68 12/19/2020   BILITOT 1.3 (H) 12/19/2020       Latest Ref Rng & Units 02/04/2022    7:33 PM 12/30/2020    1:12 AM 12/29/2020    1:35 AM  CBC  WBC 4.0 - 10.5 K/uL 10.3  4.3  4.1   Hemoglobin 13.0 - 17.0 g/dL 16.1  8.4  7.7   Hematocrit 39.0 - 52.0 % 40.2  25.9  23.7   Platelets 150 - 400 K/uL 160  185  168    Labs 03/23/2021:  Lipoprotein (a) <75.0 nmol/L  16.6  (<75)  Apo A1 + B + Ratio     0.4   (0.00 to 0.7)   External labs:   Labs 01/10/2022:  Hb 10.2/HCT 32.1, platelets 299.  Normal indicis.  Sodium 140, potassium 4.5, BUN 26, creatinine 1.21, EGFR 56/65 mL, LFTs normal.  Total cholesterol 02/07/2022, triglycerides 132, HDL 49, LDL 49.  Labs 05/12/2021:  Hb 13.9/HCT 39.8, platelets 161, normal indicis.  Vitamin D28.0.  A1c 5.6%.   Labs 01/06/2021:  Vitamin B12 855.  Sodium 140, potassium 4.5, serum glucose 97 mg, p.o. 26, creatinine 1.21, EGFR 56 mL, LFTs normal.  Iron studies normal.  Hb 10.2/HCT 32.1, platelets 299.  Labs 12/06/2020:  Total cholesterol 129, triglycerides 114, HDL 50, LDL 56.  Vitamin D 31.5, A1c 5.8%.  Hb 13.6/HCT 39.1, platelets 152.  Radiology:  MRI angio head 09/13/2020, comparison 08/26/2019:  1. Unchanged fusiform aneurysm involving the basilar tip and right P1 segment. 2. Unchanged appearance of bilateral ICA occlusion with intracranial reconstitution.  CT Chest 08/28/2021: 1. No imaging findings to suggest interstitial lung disease.  2. Diffuse  bronchial wall thickening with moderate centrilobular and paraseptal emphysema; imaging findings suggestive of underlying  COPD. 3. Extensive pleuroparenchymal thickening and architectural distortion in the lung apices, most compatible with areas of chronic post infectious or inflammatory scarring. 4. Aortic atherosclerosis, in addition to left main and three-vessel coronary artery disease. Large calcified atherosclerotic plaque at the ostium of the left subclavian artery, incompletely evaluated on today's noncontrast CT examination, but likely associated with severe stenosis. 5. There are calcifications of the aortic valve and mitral annulus. Echocardiographic correlation for evaluation of potential valvular dysfunction may be warranted if clinically indicated. 6. Ectasia of ascending thoracic aorta (4.3 cm in diameter).  Cardiac Studies:   Peripheral arteriogram 12/20/2020: Findings: Left common femoral artery appeared to be occluded I was able to cannulate this just at the top prior to a collateral and get access.  Performed aortogram which demonstrated heavily calcified aorta and bilateral common and external iliac arteries.  I then crossed over to the common iliac artery on the right where I had collaterals to fill the right lower extremity.  I performed angiography was demonstrated occluded right common femoral with long segment occlusion of his SFA which was heavily calcified reconstitutes at the level of the knee where it is diseased below the knee is much less diseased appears to have runoff via the anterior tibial and peroneal arteries.  Plan will be for right common femoral endarterectomy and femoral to below-knee popliteal artery bypass which will likely need to be performed with graft given the vein has been harvested in the past for bypass.  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  Renal artery duplex  04/20/2021: Right: No evidence of right renal  artery stenosis. RRV flow present.  Normal size right kidney.  Left:  No evidence of left renal artery stenosis. LRV flow present.  Low flow velocities detected in the left renal artery as seen on the prior exam in 2021. Kidney length (cm)11.14Kidney length (cm)9.20 (7.5 cm previously)     Carotid artery duplex 04/20/2021: Right Carotid: Evidence consistent with a total occlusion of the right ICA. Left Carotid: Velocities in the left ICA are consistent with a 1-39% stenosis. Vertebrals:  Bilateral vertebral arteries demonstrate antegrade flow. Subclavians: Right subclavian artery flow was disturbed.  PCV MYOCARDIAL PERFUSION WITH LEXISCAN 04/05/2021  Nondiagnostic ECG stress. The heart rate response was consistent with Lexiscan. There is mild motion artifact in the inferior wall. Otherwise myocardial perfusion is normal. Overall LV systolic function is normal without regional wall motion abnormalities. Stress LV EF: 79%. Small volume LV. No previous exam available for comparison. Low risk.  ABI 11/02/2021: This exam reveals normal perfusion of the right lower extremity (ABI 1.06).  This exam reveals moderately decreased perfusion of the left lower extremity, noted at the dorsalis pedis and post tibial artery level (ABI 0.5).  Mildly abnormal biphasic waveform at the right ankle and severely abnormal monophasic waveform at the left ankle.  Consider further work up.   Compared to external report, no change from 04/20/2021.  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   Echocardiogram 04/182024:  Normal LV systolic function with EF 54%. Left ventricle cavity is normal in size. Normal global wall motion.  Normal left ventricular wall thickness. Indeterminate diastolic filling pattern due to MV calcification.  Calculated EF 54%. Trileaflet aortic valve. No evidence of aortic stenosis. Moderate (Grade III) aortic regurgitation. Moderate calcification of the aortic valve annulus.  Mild aortic valve leaflet calcification. Mildly restricted aortic valve leaflets. Trace mitral regurgitation. Mild calcification of the mitral valve annulus. Mild  mitral valve leaflet calcification. Mildly restricted mitral valve leaflets. No evidence of mitral valve stenosis. Structurally normal tricuspid valve.  Mild tricuspid regurgitation. No evidence of pulmonary hypertension. The ascending aorta is well visualized. The aortic root is mildly dilated at 3.8 cm. Moderately dilated ascending aorta 4.5 cm.. Compared to 03/18/21, ascending aorta measured 4.0 cm.    EKG:   EKG 05/18/2022: Normal sinus rhythm at rate of 66 bpm, normal axis, incomplete right bundle branch block.  Normal EKG.  Compared to 10/07/2021, no significant change.    Medications and allergies   Allergies  Allergen Reactions   Avelox [Moxifloxacin Hcl In Nacl] Diarrhea   Moxifloxacin Diarrhea and Other (See Comments)   Cilostazol Diarrhea   Cyclobenzaprine Other (See Comments)   Quinolones     Other reaction(s): Unknown   Ramipril Other (See Comments)    Other reaction(s): Unknown     Current Outpatient Medications:    acetaminophen (TYLENOL) 500 MG tablet, Take 500 mg by mouth every 6 (six) hours as needed for mild pain or headache., Disp: , Rfl:    albuterol (VENTOLIN HFA) 108 (90 Base) MCG/ACT inhaler, Inhale 2 puffs into the lungs every 6 (six) hours as needed for wheezing or shortness of breath., Disp: 8 g, Rfl: 2   amLODipine (NORVASC) 2.5 MG tablet, TAKE 1 TABLET BY MOUTH AT BEDTIME., Disp: 90 tablet, Rfl: 2   aspirin (ASPIRIN CHILDRENS) 81 MG chewable tablet, Chew 1 tablet (81 mg total) by mouth daily., Disp: , Rfl:    Calcium Carbonate-Vitamin D (CALTRATE 600+D PO), Take 1 tablet by mouth daily. , Disp: , Rfl:    docusate sodium (COLACE) 100 MG capsule, Take 1 capsule (100 mg total) by mouth daily as needed for mild constipation., Disp: , Rfl: 0   fluticasone (FLONASE) 50 MCG/ACT nasal spray, Place 2 sprays  into both nostrils daily as needed for rhinitis or allergies (or nasal congestion)., Disp: , Rfl:    latanoprost (XALATAN) 0.005 % ophthalmic solution, Place 1 drop into the left eye at bedtime. , Disp: , Rfl:    loratadine (CLARITIN) 10 MG tablet, 1 tablet Orally Once a day as needed, Disp: , Rfl:    losartan-hydrochlorothiazide (HYZAAR) 100-12.5 MG tablet, Take 1 tablet by mouth daily., Disp: , Rfl:    LUTEIN PO, Take 1 capsule by mouth daily., Disp: , Rfl:    Magnesium 200 MG TABS, Take 1 tablet by mouth daily at 2 PM., Disp: , Rfl:    metoprolol succinate (TOPROL-XL) 25 MG 24 hr tablet, TAKE 1 TABLET (25 MG TOTAL) BY MOUTH DAILY. ** DO NOT CRUSH ** (BETA BLOCKER), Disp: 90 tablet, Rfl: 3   Multiple Vitamin (MULTIVITAMIN WITH MINERALS) TABS tablet, Take 1 tablet by mouth daily., Disp: 30 tablet, Rfl: 0   omeprazole (PRILOSEC) 20 MG capsule, Take 20 mg by mouth daily., Disp: , Rfl:    rosuvastatin (CRESTOR) 10 MG tablet, TAKE 1 TABLET BY MOUTH EVERY DAY (Patient taking differently: Take 10 mg by mouth at bedtime.), Disp: 90 tablet, Rfl: 3   vitamin B-12 (CYANOCOBALAMIN) 500 MCG tablet, Take 1 tablet (500 mcg total) by mouth daily., Disp: 30 tablet, Rfl: 0   Vitamin D, Ergocalciferol, (DRISDOL) 1.25 MG (50000 UNIT) CAPS capsule, 1 capsule Orally once weekly for 84 days, Disp: , Rfl:    XARELTO 2.5 MG TABS tablet, TAKE 1 TABLET BY MOUTH TWICE A DAY, Disp: 180 tablet, Rfl: 1   Assessment     ICD-10-CM   1. Atherosclerosis of  native artery of both lower extremities with intermittent claudication  I70.213 EKG 12-Lead    2. Aneurysm of ascending aorta without rupture  I71.21 PCV ECHOCARDIOGRAM COMPLETE    3. Primary hypertension  I10     4. Carotid occlusion, right  I65.21       Medications Discontinued During This Encounter  Medication Reason   azithromycin (ZITHROMAX Z-PAK) 250 MG tablet Completed Course   HYDROcodone bit-homatropine (HYCODAN) 5-1.5 MG/5ML syrup    predniSONE (DELTASONE)  10 MG tablet    ondansetron (ZOFRAN-ODT) 4 MG disintegrating tablet      No orders of the defined types were placed in this encounter.  Orders Placed This Encounter  Procedures   EKG 12-Lead   PCV ECHOCARDIOGRAM COMPLETE    Standing Status:   Future    Standing Expiration Date:   05/18/2023   Recommendations:   Lance Dillon is a 82 y.o.  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   Past medical history significant for hypertension, hyperlipidemia, chronic stage IIIa creatinine disease, chronic anemia and remote tobacco use disorder with a 90-pack-year history quit in 2004.  1. Atherosclerosis of native artery of both lower extremities with intermittent claudication Patient has not had any significant symptoms of claudication except for mild cramping.  There is no skin breakdown, no change in his physical exam, continue observation for now.  Continue aspirin 81 mg daily, Xarelto 2.5 mg p.o. twice daily.  Patient scheduled for spinal injection, he could certainly hold aspirin for 1 week and Xarelto for 3 days prior to the procedure and restart postprocedure. - EKG 12-Lead  2. Aneurysm of ascending aorta without rupture Patient's ascending aortic aneurysm has clearly increased from around 4.1 cm to 4.5 cm, I still do not think he needs a CT scan to confirm this, aorta was very well-visualized by echocardiogram.  I will repeat echocardiogram in 6 months. - PCV ECHOCARDIOGRAM COMPLETE; Future  3. Primary hypertension Blood pressure Controlled.  Renal function has remained stable.  4. Carotid  occlusion, right He has chronic occlusion of right ICA, will consider repeating carotid duplex in a year.  Over the past several years this has remained stable with no significant disease on the left.  He is also on a statin.  External labs reviewed, lipids under excellent control.  Other orders - Magnesium 200 MG TABS; Take 1 tablet by mouth daily at 2 PM. - omeprazole (PRILOSEC) 20 MG capsule; Take 20 mg by mouth daily.   Yates Decamp, MD, [facility] 05/18/2022, 10:08 AM Office: [phone]

## 2022-05-22 ENCOUNTER — Other Ambulatory Visit: Payer: Self-pay | Admitting: Family Medicine

## 2022-05-22 DIAGNOSIS — M5416 Radiculopathy, lumbar region: Secondary | ICD-10-CM

## 2022-06-12 ENCOUNTER — Inpatient Hospital Stay: Admission: RE | Admit: 2022-06-12 | Payer: Medicare HMO | Source: Ambulatory Visit

## 2022-06-12 NOTE — Progress Notes (Unsigned)
Subjective:    Patient ID: Lance Dillon, male   DOB: 08/18/1940,     MRN: [id number]    Brief patient profile:  17 yowm quit smoking 2004/worked in boiler repair  at prostate surgery with good breathing then referred to pulmonary clinic 07/19/2017 by Dr   Renne Crigler for ? 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 Renne Crigler for eval of abnormal lung sounds. Pt denies any respiratory co's.  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 He occ prod some clear sputum.   Dyspnea:  Not limited by breathing from desired activities  Including yardwork Cough: at hs  But not all night and then after stirs, better on tessalon Sleeping: better R side down/ bed flat  SABA use: none on dulera 200 2bid but hfa poor and ? dulera makes cough worse?  rec Dulera 100 Take 2 puffs first thing in am and then another 2 puffs about 12 hours later.  Work on inhaler technique:   Resume flonase one twice daily point toward ear on same side  Only take the tessalon 100 mg if you can't stop coughing     03/12/2018  f/u ov/Coti Burd re: cough x sept 2019  Chief Complaint  Patient presents with   Follow-up    Breathing is "great" and he has not had to use his rescue inhaler.    Dyspnea:  Not limited by breathing from desired activities   Cough: resolved / no longer need tessalon Sleeping: flat on either side  SABA use: none  Rec I emphasized that nasal steroids have no immediate benefit in terms of improving symptoms.      Symbicort 80 up to 2 pffs every 12 hours if needed if not doing great     08/22/2021  f/u ov/Intisar Claudio re Copd /? Asbestosis   on no maint nothing  Chief Complaint  Patient presents with   Follow-up    Pt states he has had a vein replace in right and toe amputation but breathing is good.  Dyspnea:  does stationery bike x 30 min  / limited by hips and back - hills are more difficult that used to be, but still able to get from MB back up to house s stopping  Cough: none  Sleeping: fine flat/ on pillow  SABA use: none  02: none  Covid status:  vax all  Rec To get the most out of exercise, you need to be continuously aware that you are short of breath, but never out of breath, for at least 30 minutes daily.  Make sure you check your oxygen saturations at highest level of activity      06/14/2022  f/u ov/Anaya Bovee re: Copd/ ? asbestosis  maint on no rx   Chief Complaint  Patient presents with   Follow-up    Breathing has been doing well since the last visit. He has not had to  use his albuterol since Jan 2024.    Dyspnea:  stationery bike x 15 min  x 2 / also yardwork / limited by back > doe  Cough: none  Sleeping: flat bed one pillow  SABA use: rarely  02: none     No obvious day to day or daytime variability or assoc excess/ purulent sputum or mucus plugs or hemoptysis or cp or chest tightness, subjective wheeze or overt sinus or hb symptoms.   Sleeping  without nocturnal  or early am exacerbation  of respiratory  c/o's or need for noct saba. Also denies any obvious fluctuation of symptoms with weather or environmental changes or other aggravating or alleviating factors except as outlined above   No unusual exposure hx or h/o childhood pna/ asthma or knowledge of premature birth.  Current Allergies, Complete Past Medical History, Past Surgical History, Family History, and Social History were reviewed in [employer] record.  ROS  The following are not active complaints  unless bolded Hoarseness, sore throat, dysphagia, dental problems, itching, sneezing,  nasal congestion or discharge of excess mucus or purulent secretions, ear ache,   fever, chills, sweats, unintended wt loss or wt gain, classically pleuritic or exertional cp,  orthopnea pnd or arm/hand swelling  or leg swelling, presyncope, palpitations, abdominal pain, anorexia, nausea, vomiting, diarrhea  or change in bowel habits or change in bladder habits, change in stools or change in urine, dysuria, hematuria,  rash, arthralgias, visual complaints, headache, numbness, weakness or ataxia or problems with walking or coordination,  change in mood or  memory.        Current Meds  Medication Sig   acetaminophen (TYLENOL) 500 MG tablet Take 500 mg by mouth every 6 (six) hours as needed for mild pain or headache.   albuterol (VENTOLIN HFA) 108 (90 Base) MCG/ACT inhaler Inhale 2 puffs into the lungs every 6 (six) hours as needed for wheezing or shortness of breath.   amLODipine (NORVASC) 2.5 MG tablet TAKE 1 TABLET BY MOUTH AT BEDTIME.   aspirin (ASPIRIN CHILDRENS) 81 MG chewable tablet Chew 1 tablet (81 mg total) by mouth daily.   docusate sodium (COLACE) 100 MG capsule Take 1 capsule (100 mg total) by mouth daily as needed for mild constipation.   fluticasone (FLONASE) 50 MCG/ACT nasal spray Place 2 sprays into both nostrils daily as needed for rhinitis or allergies (or nasal congestion).   latanoprost (XALATAN) 0.005 % ophthalmic solution Place 1 drop into the left eye at bedtime.    loratadine (CLARITIN) 10 MG tablet 1 tablet Orally Once a day as needed   losartan-hydrochlorothiazide (HYZAAR) 100-12.5 MG tablet Take 1 tablet by mouth daily.   LUTEIN PO Take 1 capsule by mouth daily.   Magnesium 200 MG TABS Take 1 tablet by mouth daily at 2 PM.   metoprolol succinate (TOPROL-XL) 25 MG 24 hr tablet TAKE 1 TABLET (25 MG TOTAL) BY MOUTH DAILY. ** DO NOT CRUSH ** (BETA BLOCKER)   Multiple Vitamin (MULTIVITAMIN  WITH MINERALS) TABS tablet Take 1 tablet by mouth daily.   omeprazole (PRILOSEC) 20 MG capsule Take 20 mg by mouth daily.   rosuvastatin (CRESTOR) 10 MG tablet TAKE 1 TABLET BY MOUTH EVERY DAY (Patient taking differently: Take 10 mg by mouth at bedtime.)   vitamin B-12 (CYANOCOBALAMIN) 500 MCG tablet Take 1 tablet (500 mcg total) by mouth daily.   Vitamin D, Ergocalciferol, (DRISDOL) 1.25 MG (50000 UNIT) CAPS capsule 1 capsule Orally once weekly for 84 days  XARELTO 2.5 MG TABS tablet TAKE 1 TABLET BY MOUTH TWICE A DAY                    Objective:   Physical Exam  Wts  06/14/2022       168   08/22/2021      165  03/12/2018       168  02/12/2018       167  07/19/17 167 lb (75.8 kg)  12/01/16 167 lb 6.4 oz (75.9 kg)  05/17/16 164 lb (74.4 kg)     Vital signs reviewed  06/14/2022  - Note at rest 02 sats  97% on RA   General appearance:    amb elderly wm nad     HEENT : Oropharynx  clear     NECK :  without  apparent JVD/ palpable Nodes/TM    LUNGS: no acc muscle use,  Min barrel  contour chest wall with bilateral  slightly decreased bs s audible wheeze and trace crackles in bases bilaterally  and  without cough on insp or exp maneuvers and min  Hyperresonant  to  percussion bilaterally    CV:  RRR  no s3 or murmur or increase in P2, and no edema   ABD:  soft and nontender with pos end  insp Hoover's  in the supine position.  No bruits or organomegaly appreciated   MS:  Nl gait/ ext warm without deformities Or obvious joint restrictions  calf tenderness, cyanosis or clubbing     SKIN: warm and dry without lesions    NEURO:  alert, approp, nl sensorium with  no motor or cerebellar deficits apparent.           I personally reviewed images and agree with radiology impression as follows:  CXR:   portable 02/04/22 Copd no acute changes    Assessment:

## 2022-06-13 DIAGNOSIS — L820 Inflamed seborrheic keratosis: Secondary | ICD-10-CM | POA: Diagnosis not present

## 2022-06-13 DIAGNOSIS — L298 Other pruritus: Secondary | ICD-10-CM | POA: Diagnosis not present

## 2022-06-13 DIAGNOSIS — L821 Other seborrheic keratosis: Secondary | ICD-10-CM | POA: Diagnosis not present

## 2022-06-13 DIAGNOSIS — L814 Other melanin hyperpigmentation: Secondary | ICD-10-CM | POA: Diagnosis not present

## 2022-06-13 DIAGNOSIS — B079 Viral wart, unspecified: Secondary | ICD-10-CM | POA: Diagnosis not present

## 2022-06-13 DIAGNOSIS — D492 Neoplasm of unspecified behavior of bone, soft tissue, and skin: Secondary | ICD-10-CM | POA: Diagnosis not present

## 2022-06-13 DIAGNOSIS — L570 Actinic keratosis: Secondary | ICD-10-CM | POA: Diagnosis not present

## 2022-06-13 DIAGNOSIS — L538 Other specified erythematous conditions: Secondary | ICD-10-CM | POA: Diagnosis not present

## 2022-06-13 DIAGNOSIS — D225 Melanocytic nevi of trunk: Secondary | ICD-10-CM | POA: Diagnosis not present

## 2022-06-14 ENCOUNTER — Ambulatory Visit: Payer: Medicare HMO | Admitting: Internal Medicine

## 2022-06-14 ENCOUNTER — Telehealth: Payer: Self-pay | Admitting: Family Medicine

## 2022-06-14 ENCOUNTER — Encounter: Payer: Self-pay | Admitting: Internal Medicine

## 2022-06-14 VITALS — BP 118/60 | HR 70 | Temp 98.4°F | Ht 67.0 in | Wt 168.8 lb

## 2022-06-14 DIAGNOSIS — J449 Chronic obstructive pulmonary disease, unspecified: Secondary | ICD-10-CM

## 2022-06-14 DIAGNOSIS — M5416 Radiculopathy, lumbar region: Secondary | ICD-10-CM

## 2022-06-14 NOTE — Telephone Encounter (Signed)
I am going to fax the past PT to see if we can avoid doing PT again but usually it has to be with in the last 3 months

## 2022-06-14 NOTE — Patient Instructions (Signed)
Make sure you check your oxygen saturation on pulse oximeter maybe once a week  at your highest level of activity (NOT after you stop)  to be sure it stays over 90% and keep track of it at least once a week, more often if breathing getting worse, and let me know if losing ground. (Collect the dots to connect the dots approach - goal is keeping about 90%)   If you are satisfied with your treatment plan,  let your doctor know and he/she can either refill your medications or you can return here when your prescription runs out.     If in any way you are not 100% satisfied,  please tell us.  If 100% better, tell your friends!  Pulmonary follow up is as needed

## 2022-06-14 NOTE — Telephone Encounter (Signed)
Hi Briana,  Pt completed PT for low back and hip pain 08/29/21-09/21/21, was that too long ago?

## 2022-06-14 NOTE — Telephone Encounter (Signed)
Cathy from [facility] called stating that the Epidural that was ordered has been denied due to the patient not having physical therapy.  Please advise next steps.

## 2022-06-15 NOTE — Assessment & Plan Note (Addendum)
Quit smokng 2004 - Spirometry 07/19/2017  FEV1 1.68 (61%)  Ratio 65 with classic curvature   - 02/12/2018    changed from dulera 200 to 100 2bid as cough is the main symptom - 03/12/2018  After extensive coaching inhaler device,  effectiveness =    90% > continue on symbicort 80 as insurance prefers but ok to taper if doing great > pt d/c'd and no worse off as of 06/14/2022   Pt is Group A in terms of symptom/risk and sama prn therefore appropriate rx at this point >>>  ventolin prn and pulmonary f/u can be prn   Advised: Make sure you check your oxygen saturation at your highest level of activity(NOT after you stop)  to be sure it stays over 90% and keep track of it at least once a week, more often if breathing getting worse, and let me know if losing ground. (Collect the dots to connect the dots approach)           Each maintenance medication was reviewed in detail including emphasizing most importantly the difference between maintenance and prns and under what circumstances the prns are to be triggered using an action plan format where appropriate.  Total time for H and P, chart review, counseling, reviewing hfa device(s) and generating customized AVS unique to this office visit / same day charting = 20 min

## 2022-06-21 NOTE — Telephone Encounter (Signed)
I received a fax denial after sending 127 pages. Patient will have to do more recent PT

## 2022-06-21 NOTE — Telephone Encounter (Signed)
Pt wife called requesting update on the epidural or what pt should do instead?

## 2022-06-22 NOTE — Telephone Encounter (Signed)
Please inform patient he will have to do PT.  Where would he like to go?

## 2022-06-28 NOTE — Telephone Encounter (Signed)
Pt wife called again about this issue. He has already done Physical Therapy and does not know what to do next.  Are we appealing this decision? Please inform wife of status.

## 2022-06-29 NOTE — Telephone Encounter (Signed)
Unfortunately your physical therapy was last year.  Your insurance wants conservative management trial such as physical therapy in the last 3 months.  I do not think I am going to be able to talk about it.  You certainly could contact your insurance company and try to appeal yourself.

## 2022-07-03 NOTE — Addendum Note (Signed)
Addended by: Dierdre Searles on: 07/03/2022 12:06 PM   Modules accepted: Orders

## 2022-07-03 NOTE — Telephone Encounter (Signed)
New PT referral/order placed to [facility].

## 2022-07-13 ENCOUNTER — Ambulatory Visit: Payer: Medicare HMO | Admitting: Physical Therapy

## 2022-07-13 DIAGNOSIS — R2689 Other abnormalities of gait and mobility: Secondary | ICD-10-CM | POA: Diagnosis not present

## 2022-07-13 DIAGNOSIS — M6281 Muscle weakness (generalized): Secondary | ICD-10-CM

## 2022-07-13 DIAGNOSIS — M5459 Other low back pain: Secondary | ICD-10-CM | POA: Diagnosis not present

## 2022-07-13 NOTE — Therapy (Signed)
OUTPATIENT PHYSICAL THERAPY LOWER EXTREMITY EVALUATION   Patient Name: Lance Dillon MRN: [id number] DOB:08-21-1940, 82 y.o., male Today's Date: 07/13/2022  END OF SESSION:  PT End of Session - 07/14/22 1012     Visit Number 1    Number of Visits 16    Date for PT Re-Evaluation 09/07/22    Authorization Type [employer]    PT Start Time 1600    PT Stop Time 1640    PT Time Calculation (min) 40 min    Activity Tolerance Patient tolerated treatment well    Behavior During Therapy [facility] for tasks assessed/performed             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   Bilateral: Noted on MRA of brain in November 2015 with collateral flow from posterior circulation and external collaterals reconstituting anterior circulation   Chronic kidney disease    Colon polyps 2004, 07, 14   COPD (chronic obstructive pulmonary disease) (HCC)    GERD (gastroesophageal reflux disease)    diet controlled, no meds   Glaucoma    left eye    Hx of radiation therapy 03/22/11 to 05/08/11   PSA recurrent carcinoma of prostate   Hypercholesterolemia    Hypertension    Macular degeneration of both eyes    Peripheral vascular disease (HCC) 07/2011   Bilateral SFA stenosis: Status post right femoropopliteal bypass - Dr. 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   Right-sided 19 mm x 8 mm fusiform aneurysm    Prostate cancer (HCC) 2004   Renal artery stenosis (HCC)    70% left   Stones in the urinary tract    Stroke [facility])    Urinary incontinence    Past Surgical History:  Procedure Laterality Date   ABDOMINAL AORTAGRAM N/A 06/20/2011   Procedure: ABDOMINAL Ronny Flurry;  Surgeon: Nada Libman, MD;  Location: [facility] CATH LAB;  Service: Cardiovascular;  Laterality: N/A;   ABDOMINAL  AORTOGRAM W/LOWER EXTREMITY N/A 12/20/2020   Procedure: ABDOMINAL AORTOGRAM W/LOWER EXTREMITY;  Surgeon: Maeola Harman, MD;  Location: [facility] INVASIVE CV LAB;  Service: Cardiovascular;  Laterality: N/A;   AMPUTATION Right 12/28/2020   Procedure: RIGHT 4TH TOE AMPUTATION;  Surgeon: Maeola Harman, MD;  Location: [facility] OR;  Service: Vascular;  Laterality: Right;   CARDIOVASCULAR STRESS TEST  07/06/2011   Evidence of mild ischemia in Basal Inferior and Mid Inferior regions, no significant wall motion abnormalities noted.   clamp to penis     for urinary control   COLONOSCOPY     COLONOSCOPY W/ BIOPSIES     multiple    CYSTOSCOPY W/ URETERAL STENT PLACEMENT Left 06/07/2015   Procedure: CYSTOSCOPY, RETROGRADE PYLEGRAM  WITH STENT PLACEMENT;  Surgeon: Alfredo Martinez, MD;  Location: [facility] ORS;  Service: Urology;  Laterality: Left;   CYSTOSCOPY WITH RETROGRADE PYELOGRAM, URETEROSCOPY AND STENT PLACEMENT Left 07/28/2015   Procedure: CYSTOSCOPY, URETEROSCOPY, WITH BASKET STONE REMOVAL;  Surgeon: Heloise Purpura, MD;  Location: [facility] ORS;  Service: Urology;  Laterality: Left;   ENDARTERECTOMY FEMORAL Right 12/21/2020   Procedure: RIGHT FEMORAL ENDARTERECTOMY;  Surgeon: Maeola Harman, MD;  Location: [facility] OR;  Service: Vascular;  Laterality: Right;   EYE SURGERY Right 08/2014   Cataract; removed bilat   FEMORAL-POPLITEAL BYPASS  GRAFT  07/19/2011   Procedure: BYPASS GRAFT FEMORAL-POPLITEAL ARTERY;  Surgeon: Pryor Ochoa, MD;  Location: [facility] OR;  Service: Vascular;  Laterality: Right;  Right Femoral - Popliteal  Bypss with saphenous vein   FEMORAL-POPLITEAL BYPASS GRAFT Right 12/21/2020   Procedure: RIGHT FEMORAL-POPLITEAL ARTERY BYPASS GRAFT WITH PTFE;  Surgeon: Maeola Harman, MD;  Location: [facility] OR;  Service: Vascular;  Laterality: Right;   HERNIA REPAIR     right side,lft   INTRAOPERATIVE ARTERIOGRAM  07/19/2011   Procedure: INTRA OPERATIVE ARTERIOGRAM;  Surgeon: Pryor Ochoa, MD;  Location: [facility] OR;  Service: Vascular;  Laterality: Right;   NM MYOVIEW LTD  07/2011   Normal EF, very small area of basal-mid inferior ischemia; LOW RISK   PENILE PROSTHESIS  REMOVAL     PENILE PROSTHESIS IMPLANT     PR VEIN BYPASS GRAFT,AORTO-FEM-POP  07/19/2011   Right  Fem/Pop BPG   PROSTATE BIOPSY     prostatectomy retropubic radical  07/15/2002   with nerve sparing, Gleason 3+4=7   Reclast  07/25/2013   RENAL DUPLEX  07/01/2012   Left renal artery 60-99% diameter reduction   TRANSTHORACIC ECHOCARDIOGRAM  01/2017   Normal EF and overall function   Patient Active Problem List   Diagnosis Date Noted   COVID-19 virus infection 02/10/2022   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   Bilateral low back pain without sciatica 01/19/2021   Cardiovascular symptoms 01/19/2021   Chronic kidney disease, stage 3a (HCC) 01/19/2021   Encounter for general adult medical examination with abnormal findings 01/19/2021   Gangrene of toe of right foot (HCC) 01/19/2021   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) 01/19/2021   Moderate persistent asthma with (acute) exacerbation 01/19/2021   Nephrosclerosis 01/19/2021   Osteoporosis 01/19/2021   Scoliosis 01/19/2021   Tobacco user 01/19/2021   Urinary incontinence 01/19/2021   Malnutrition of moderate degree 12/29/2020   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) 12/28/2020   Ischemic ulcer of foot, right, with fat layer exposed (HCC) 12/19/2020   COPD (chronic obstructive pulmonary disease) (HCC) 12/19/2020   Lumbar radiculopathy 05/28/2020   Posterior vitreous detachment of left eye 08/13/2019   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 08/13/2019   Retinal layer separation 08/13/2019   Primary open angle glaucoma of both eyes, severe stage 08/13/2019   Rhinitis, chronic 03/12/2018   Degeneration of lumbar intervertebral disc 08/28/2017    Lung field abnormal finding on examination 07/19/2017   COPD GOLD II   07/19/2017   Carotid occlusion, right 08/11/2015   Aneurysm, cerebral 08/11/2015   Hydronephrosis, left 06/06/2015   Kidney disease, chronic, stage II (GFR 60-89 ml/min) 06/06/2015   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   Dizziness 12/16/2013   Vertigo    Left renal artery stenosis - 70% by angiography; increased velocity on recent Dopplers (60-99%) 12/08/2012    Class: Diagnosis of   Hyperlipidemia LDL goal <70 12/08/2012   Personal history of colonic adenomas 11/15/2012   Pain in limb 02/13/2012   Peripheral vascular disease (HCC) 08/07/2011   Atherosclerosis of native artery of extremity with intermittent claudication (HCC) 06/13/2011   Prostate cancer (HCC) 02/16/2011   Primary hypertension 12/09/2010     PCP: Merri Brunette  REFERRING PROVIDER: [name]  REFERRING DIAG: Low back pain   THERAPY DIAG:  Other low back pain  Muscle weakness (generalized)  Other abnormalities of gait and mobility  Rationale for Evaluation and Treatment: Rehabilitation  ONSET DATE:     SUBJECTIVE:   SUBJECTIVE STATEMENT:  Pt states ongoing pain in bil low back, into bil hips. He has had 2 or 3 epidurals. Did feel some relief with this, but not long lasting. He states much pain with standing and walking only a few minutes (about 10 )  causing him to need to sit down. Sitting and laying he has minimal pain. He was seen in PT previously, about 1 year ago. He also states weakness in his legs L >R and has fallen a couple times because of his leg giving out.  Did have recent injection in hip and SIJ, he is unable to determine if this helped.  He Uses 4 WW for longer distances, so he can sit down.  Uses cane the rest of the time, using cane today.     PERTINENT HISTORY:  HTN, prostate CA, Osteoporosis, COPD, CKD, brain aneurysm, previous stroke?    PAIN:  Are you having pain? Yes: NPRS scale: 8-10/10 Pain location: Bil low back, into hips  Pain description: sore, dull with sharp shooting pains  Aggravating factors: standing Relieving factors: rest, laying down, sitting.   PRECAUTIONS: None  WEIGHT BEARING RESTRICTIONS: No  FALLS:  Has patient fallen in last 6 months? Yes. Number of falls 2 in the last year , L leg giving out, states weakness or pain?    PLOF: Independent  PATIENT GOALS:  Decreased pain   NEXT MD VISIT:   OBJECTIVE:   DIAGNOSTIC FINDINGS:   PATIENT SURVEYS:    COGNITION: Overall cognitive status: Within functional limits for tasks assessed     SENSATION: WFL  EDEMA:    POSTURE:    standing: noted scoliosis, and trunk shift to R.   PALPATION: Tenderness in L >R mid and low lumbar, Tenderness in bil Musculature, and into Bil SIJ.    LOWER EXTREMITY ROM: Lumbar ROM:   flexion: mod limitation,  Ext: significant limitation,  SB: mod/significant limitation -  Hips: WFL,   Knees: WFL  LOWER EXTREMITY MMT:  MMT Left eval Right  eval  Hip flexion 4- 4-  Hip extension    Hip abduction 4- 4-  Hip adduction    Hip internal rotation    Hip external rotation 4- 4-  Knee flexion 4- 4+  Knee extension 4 5  Ankle dorsiflexion    Ankle plantarflexion 3- unable to do HR (bil)  3- 3- unable to do HR (bil)   Ankle inversion    Ankle eversion     (Blank rows = not tested)  LOWER EXTREMITY SPECIAL TESTS:    GAIT: Distance walked: 100 ft  Assistive device utilized: Single point cane Level of assistance: Modified independence Comments: Decreased foot clearance on L, decreased hip and knee flexion on L, slow speed, poor balance,    TODAY'S TREATMENT:  DATE:    07/13/22: Ther ex:  Review for SLR, s/l Hip abd,  s/l clams,  Sit to stand  (higher surface), LTR, and pelvic tilts   PATIENT EDUCATION:  Education details: PT POC, Exam findings, HEP Person educated: Patient Education method: Explanation, Demonstration, Tactile cues, Verbal cues, and Handouts Education comprehension: verbalized understanding, returned demonstration, verbal cues required, tactile cues required, and needs further education   HOME EXERCISE PROGRAM:  Access Code: [email or web address] URL: [email or web address] Date: 07/14/2022 Prepared by: Sedalia Muta  Exercises - Supine Lower Trunk Rotation  - 1- 2 x daily - 10 reps - 5 hold - Supine Posterior Pelvic Tilt  - 2 x daily - 1 sets - 10 reps - Straight Leg Raise  - 1 x daily - 1 sets - 10 reps - Sidelying Hip Abduction  - 1 x daily - 1 sets - 10 reps - Clam  - 1 x daily - 1 sets - 10 reps - Sit to Stand Without Arm Support  - 1 x daily - 1 sets - 5 reps  CLINICAL IMPRESSION: Patient presents with primary complaint of  pain in bil low back. He has ongoing, significant pain and very low tolerance for standing and walking due to pain. He has much weakness in L LE, and poor gait mechanics, with decreased clearance of L foot/LE. He will benefit from strengthening as able for L leg, gait training, and education on pain management for low back pain.  Pt with decreased ability for full functional activities. Pt will  benefit from skilled PT to improve deficits and pain and to return to PLOF.   OBJECTIVE IMPAIRMENTS: Abnormal gait, decreased activity tolerance, decreased balance, decreased knowledge of use of DME, decreased mobility, difficulty walking, decreased ROM, decreased strength, improper body mechanics, and pain.   ACTIVITY LIMITATIONS: lifting, bending, standing, squatting, stairs, transfers, dressing, hygiene/grooming, and locomotion level  PARTICIPATION LIMITATIONS: meal prep, shopping, and community  activity  PERSONAL FACTORS: 1 comorbidity: possible/previous stroke, L weakness  are also affecting patient's functional outcome.   REHAB POTENTIAL: Fair chronic pain  CLINICAL DECISION MAKING: Stable/uncomplicated  EVALUATION COMPLEXITY: Low   GOALS: Goals reviewed with patient? Yes  SHORT TERM GOALS: Target date: 07/27/2022    Pt to be independent with initial HEP  Goal status: INITIAL    LONG TERM GOALS: Target date: 09/07/2022   Pt to be independent with final HEP  Goal status: INITIAL  2.  Pt to demo improved strength of L LE to at least 4/5 to improve stability and gait.   Goal status: INITIAL  3.  Pt to demo improved ambulation with LRAD, with improved foot and LE clearance on L, for at least 150 ft, and balance WFL,  to improve safety and falls.   Goal status: INITIAL  4. Pt to be independent with self management/pain relief techniques for low back pain.      PLAN:  PT FREQUENCY: 1-2x/week  PT DURATION: 8 weeks  PLANNED INTERVENTIONS: Therapeutic exercises, Therapeutic activity, Neuromuscular re-education, Patient/Family education, Self Care, Joint mobilization, Joint manipulation, Stair training, Orthotic/Fit training, DME instructions, Aquatic Therapy, Dry Needling, Electrical stimulation, Cryotherapy, Moist heat, Taping, Ultrasound, Ionotophoresis 4mg /ml Dexamethasone, Manual therapy,  Vasopneumatic device, Traction, Spinal manipulation, Spinal mobilization,Balance training, Gait training,   PLAN FOR NEXT SESSION: LE strength, gait training, low back decompression and mobility    Sedalia Muta, PT, DPT 10:30 AM  07/14/22

## 2022-07-14 ENCOUNTER — Encounter: Payer: Self-pay | Admitting: Physical Therapy

## 2022-07-17 ENCOUNTER — Ambulatory Visit: Payer: Medicare HMO | Admitting: Physical Therapy

## 2022-07-17 ENCOUNTER — Encounter: Payer: Self-pay | Admitting: Physical Therapy

## 2022-07-17 DIAGNOSIS — M6281 Muscle weakness (generalized): Secondary | ICD-10-CM

## 2022-07-17 DIAGNOSIS — M5459 Other low back pain: Secondary | ICD-10-CM | POA: Diagnosis not present

## 2022-07-17 DIAGNOSIS — R2689 Other abnormalities of gait and mobility: Secondary | ICD-10-CM | POA: Diagnosis not present

## 2022-07-17 NOTE — Therapy (Signed)
OUTPATIENT PHYSICAL THERAPY LOWER EXTREMITY TREATMENT   Patient Name: Lance Dillon MRN: [id number] DOB:[birth year], 82 y.o., male Today's Date: 07/17/2022  END OF SESSION:  PT End of Session - 07/17/22 0930     Visit Number 2    Number of Visits 16    Date for PT Re-Evaluation 09/07/22    Authorization Type [employer]    PT Start Time 0932    PT Stop Time 1012    PT Time Calculation (min) 40 min    Activity Tolerance Patient tolerated treatment well    Behavior During Therapy [facility] for tasks assessed/performed             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   Bilateral: Noted on MRA of brain in November 2015 with collateral flow from posterior circulation and external collaterals reconstituting anterior circulation   Chronic kidney disease    Colon polyps 2004, 07, 14   COPD (chronic obstructive pulmonary disease) (HCC)    GERD (gastroesophageal reflux disease)    diet controlled, no meds   Glaucoma    left eye    Hx of radiation therapy 03/22/11 to 05/08/11   PSA recurrent carcinoma of prostate   Hypercholesterolemia    Hypertension    Macular degeneration of both eyes    Peripheral vascular disease (HCC) 07/2011   Bilateral SFA stenosis: Status post right femoropopliteal bypass - Dr. 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 CATH LAB;  Service: Cardiovascular;  Laterality: N/A;   ABDOMINAL  AORTOGRAM W/LOWER EXTREMITY N/A 12/20/2020   Procedure: ABDOMINAL AORTOGRAM W/LOWER EXTREMITY;  Surgeon: Maeola Harman, MD;  Location: [facility] INVASIVE CV LAB;  Service: Cardiovascular;  Laterality: N/A;   AMPUTATION Right 12/28/2020   Procedure: RIGHT 4TH TOE AMPUTATION;  Surgeon: Maeola Harman, MD;  Location: [facility] OR;  Service: Vascular;  Laterality: Right;   CARDIOVASCULAR STRESS TEST  07/06/2011   Evidence of mild ischemia in Basal Inferior and Mid Inferior regions, no significant wall motion abnormalities noted.   clamp to penis     for urinary control   COLONOSCOPY     COLONOSCOPY W/ BIOPSIES     multiple    CYSTOSCOPY W/ URETERAL STENT PLACEMENT Left 06/07/2015   Procedure: CYSTOSCOPY, RETROGRADE PYLEGRAM  WITH STENT PLACEMENT;  Surgeon: Alfredo Martinez, MD;  Location: [facility] ORS;  Service: Urology;  Laterality: Left;   CYSTOSCOPY WITH RETROGRADE PYELOGRAM, URETEROSCOPY AND STENT PLACEMENT Left 07/28/2015   Procedure: CYSTOSCOPY, URETEROSCOPY, WITH BASKET STONE REMOVAL;  Surgeon: Heloise Purpura, MD;  Location: [facility] ORS;  Service: Urology;  Laterality: Left;   ENDARTERECTOMY FEMORAL Right 12/21/2020   Procedure: RIGHT FEMORAL ENDARTERECTOMY;  Surgeon: Maeola Harman, MD;  Location: [facility] OR;  Service: Vascular;  Laterality: Right;   EYE SURGERY Right 08/2014   Cataract; removed bilat   FEMORAL-POPLITEAL BYPASS  GRAFT  07/19/2011   Procedure: BYPASS GRAFT FEMORAL-POPLITEAL ARTERY;  Surgeon: Pryor Ochoa, MD;  Location: [facility] OR;  Service: Vascular;  Laterality: Right;  Right Femoral - Popliteal  Bypss with saphenous vein   FEMORAL-POPLITEAL BYPASS GRAFT Right 12/21/2020   Procedure: RIGHT FEMORAL-POPLITEAL ARTERY BYPASS GRAFT WITH PTFE;  Surgeon: Maeola Harman, MD;  Location: [facility] OR;  Service: Vascular;  Laterality: Right;   HERNIA REPAIR     right side,lft   INTRAOPERATIVE ARTERIOGRAM  07/19/2011   Procedure: INTRA OPERATIVE ARTERIOGRAM;  Surgeon: Pryor Ochoa, MD;  Location: [facility] OR;  Service: Vascular;  Laterality: Right;   NM MYOVIEW LTD  07/2011   Normal EF, very small area of basal-mid inferior ischemia; LOW RISK   PENILE PROSTHESIS  REMOVAL     PENILE PROSTHESIS IMPLANT     PR VEIN BYPASS GRAFT,AORTO-FEM-POP  07/19/2011   Right  Fem/Pop BPG   PROSTATE BIOPSY     prostatectomy retropubic radical  07/15/2002   with nerve sparing, Gleason 3+4=7   Reclast  07/25/2013   RENAL DUPLEX  07/01/2012   Left renal artery 60-99% diameter reduction   TRANSTHORACIC ECHOCARDIOGRAM  01/2017   Normal EF and overall function   Patient Active Problem List   Diagnosis Date Noted   COVID-19 virus infection 02/10/2022   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   Bilateral low back pain without sciatica 01/19/2021   Cardiovascular symptoms 01/19/2021   Chronic kidney disease, stage 3a (HCC) 01/19/2021   Encounter for general adult medical examination with abnormal findings 01/19/2021   Gangrene of toe of right foot (HCC) 01/19/2021   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) 01/19/2021   Moderate persistent asthma with (acute) exacerbation 01/19/2021   Nephrosclerosis 01/19/2021   Osteoporosis 01/19/2021   Scoliosis 01/19/2021   Tobacco user 01/19/2021   Urinary incontinence 01/19/2021   Malnutrition of moderate degree 12/29/2020   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) 12/28/2020   Ischemic ulcer of foot, right, with fat layer exposed (HCC) 12/19/2020   COPD (chronic obstructive pulmonary disease) (HCC) 12/19/2020   Lumbar radiculopathy 05/28/2020   Posterior vitreous detachment of left eye 08/13/2019   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 08/13/2019   Retinal layer separation 08/13/2019   Primary open angle glaucoma of both eyes, severe stage 08/13/2019   Rhinitis, chronic 03/12/2018   Degeneration of lumbar intervertebral disc 08/28/2017    Lung field abnormal finding on examination 07/19/2017   COPD GOLD II   07/19/2017   Carotid occlusion, right 08/11/2015   Aneurysm, cerebral 08/11/2015   Hydronephrosis, left 06/06/2015   Kidney disease, chronic, stage II (GFR 60-89 ml/min) 06/06/2015   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   Dizziness 12/16/2013   Vertigo    Left renal artery stenosis - 70% by angiography; increased velocity on recent Dopplers (60-99%) 12/08/2012    Class: Diagnosis of   Hyperlipidemia LDL goal <70 12/08/2012   Personal history of colonic adenomas 11/15/2012   Pain in limb 02/13/2012   Peripheral vascular disease (HCC) 08/07/2011   Atherosclerosis of native artery of extremity with intermittent claudication (HCC) 06/13/2011   Prostate cancer (HCC) 02/16/2011   Primary hypertension 12/09/2010     PCP: Merri Brunette  REFERRING PROVIDER: [name]  REFERRING DIAG: Low back pain   THERAPY DIAG:  Other low back pain  Muscle weakness (generalized)  Other abnormalities of gait and mobility  Rationale for Evaluation and Treatment: Rehabilitation  ONSET DATE:     SUBJECTIVE:   SUBJECTIVE STATEMENT:   Eval: Pt states ongoing pain in bil low back, into bil hips. He has had 2 or 3 epidurals. Did feel some relief with this, but not long lasting. He states much pain with standing and walking only a few minutes (about 10 )  causing him to need to sit down. Sitting and laying he has minimal pain. He was seen in PT previously, about 1 year ago. He also states weakness in his legs L >R and has fallen a couple times because of his leg giving out.  Did have recent injection in hip and SIJ, he is unable to determine if this helped.  He Uses 4 WW for longer distances, so he can sit down.  Uses cane the rest of the time, using cane today.     PERTINENT HISTORY:  HTN, prostate CA, Osteoporosis, COPD, CKD, brain aneurysm, previous stroke?    PAIN:  Are you having pain? Yes: NPRS scale: 8-10/10 Pain location: Bil low back, into hips  Pain description: sore, dull with sharp shooting pains  Aggravating factors: standing Relieving factors: rest, laying down, sitting.   PRECAUTIONS: None  WEIGHT BEARING RESTRICTIONS: No  FALLS:  Has patient fallen in last 6 months? Yes. Number of falls 2 in the last year , L leg giving out, states weakness or pain?    PLOF: Independent  PATIENT GOALS:  Decreased pain   NEXT MD VISIT:   OBJECTIVE:   DIAGNOSTIC FINDINGS:   PATIENT SURVEYS:    COGNITION: Overall cognitive status: Within functional limits for tasks assessed     SENSATION: WFL  EDEMA:    POSTURE:    standing: noted scoliosis, and trunk shift to R.   PALPATION: Tenderness in L >R mid and low lumbar, Tenderness in bil Musculature, and into Bil SIJ.    LOWER EXTREMITY ROM: Lumbar ROM:   flexion: mod limitation,  Ext: significant limitation,  SB: mod/significant limitation -  Hips: WFL,   Knees: WFL  LOWER EXTREMITY MMT:  MMT Left eval Right  eval  Hip flexion 4- 4-  Hip extension    Hip abduction 4- 4-  Hip adduction    Hip internal rotation    Hip external rotation 4- 4-  Knee flexion 4- 4+  Knee extension 4 5  Ankle dorsiflexion    Ankle plantarflexion 3- unable to do HR (bil)  3- 3- unable to do HR (bil)   Ankle inversion    Ankle eversion     (Blank rows = not tested)  LOWER EXTREMITY SPECIAL TESTS:    GAIT: Distance walked: 100 ft  Assistive device utilized: Single point cane Level of assistance: Modified independence Comments: Decreased foot clearance on L, decreased hip and knee flexion on L, slow speed, poor balance,    TODAY'S TREATMENT:  DATE:    07/17/2022 Therapeutic Exercise: Aerobic:  Supine:  SLR 2 x 5 bil with TA:  Supine march with TA for control of L leg x 15;  Seated:  LAQ 2 x 10 bil (2.5 lb on R);   Sit to stand (higher mat table ) 2 x 5 ;  Standing: March x 20;  Stretches:  LTR x 10; seated HSS 30 sec x 3 bil;  Seated fwd flexion 15 sec x 4;  Neuromuscular Re-education: Manual Therapy: Therapeutic Activity: Self Care:   07/13/22: Ther ex:  Review for SLR, s/l Hip abd,  s/l clams,  Sit to stand  (higher surface), LTR, and pelvic tilts   PATIENT EDUCATION:  Education details: updated and reviewed HEP Person educated: Patient Education method: Explanation, Demonstration, Tactile cues, Verbal cues, and Handouts Education comprehension: verbalized understanding, returned demonstration, verbal cues required, tactile cues required, and needs further education   HOME EXERCISE PROGRAM:  Access Code: [email or web address]    CLINICAL IMPRESSION: 07/17/2022 Reviewed LE strength today. Pt with good tolerance, but tires easily and is very weak in L leg. He will benefit from continued strength, back pain relief, and gait training.   Eval: Patient presents with primary complaint of  pain in bil low back. He has ongoing, significant pain and very low tolerance for standing and walking due to pain. He has much weakness in L LE, and poor gait mechanics, with decreased clearance of L foot/LE. He will benefit from strengthening as able for L leg, gait training, and education on pain management for low back pain.  Pt with decreased ability for full functional activities. Pt will  benefit from skilled PT to improve deficits and pain and to return to PLOF.   OBJECTIVE IMPAIRMENTS: Abnormal gait, decreased activity tolerance, decreased balance, decreased knowledge of use of DME, decreased mobility, difficulty walking, decreased ROM, decreased strength, improper body mechanics, and pain.   ACTIVITY LIMITATIONS: lifting, bending, standing,  squatting, stairs, transfers, dressing, hygiene/grooming, and locomotion level  PARTICIPATION LIMITATIONS: meal prep, shopping, and community activity  PERSONAL FACTORS: 1 comorbidity: possible/previous stroke, L weakness  are also affecting patient's functional outcome.   REHAB POTENTIAL: Fair chronic pain  CLINICAL DECISION MAKING: Stable/uncomplicated  EVALUATION COMPLEXITY: Low   GOALS: Goals reviewed with patient? Yes  SHORT TERM GOALS: Target date: 07/27/2022    Pt to be independent with initial HEP  Goal status: INITIAL    LONG TERM GOALS: Target date: 09/07/2022   Pt to be independent with final HEP  Goal status: INITIAL  2.  Pt to demo improved strength of L LE to at least 4/5 to improve stability and gait.   Goal status: INITIAL  3.  Pt to demo improved ambulation with LRAD, with improved foot and LE clearance on L, for at least 150 ft, and balance WFL,  to improve safety and falls.   Goal status: INITIAL  4. Pt to be independent with self management/pain relief techniques for low back pain.      PLAN:  PT FREQUENCY: 1-2x/week  PT DURATION: 8 weeks  PLANNED INTERVENTIONS: Therapeutic exercises, Therapeutic activity, Neuromuscular re-education, Patient/Family education, Self Care, Joint mobilization, Joint manipulation, Stair training, Orthotic/Fit training, DME instructions, Aquatic Therapy, Dry Needling, Electrical stimulation, Cryotherapy, Moist heat, Taping, Ultrasound, Ionotophoresis 4mg /ml Dexamethasone, Manual therapy,  Vasopneumatic device, Traction, Spinal manipulation, Spinal mobilization,Balance training, Gait training,   PLAN FOR NEXT SESSION: LE strength, gait training, low back decompression and mobility    Sedalia Muta, PT, DPT 9:30 AM  07/17/22  

## 2022-07-20 ENCOUNTER — Encounter: Payer: Self-pay | Admitting: Physical Therapy

## 2022-07-20 ENCOUNTER — Ambulatory Visit: Payer: Medicare HMO | Admitting: Physical Therapy

## 2022-07-20 DIAGNOSIS — M6281 Muscle weakness (generalized): Secondary | ICD-10-CM | POA: Diagnosis not present

## 2022-07-20 DIAGNOSIS — R2689 Other abnormalities of gait and mobility: Secondary | ICD-10-CM

## 2022-07-20 DIAGNOSIS — M5459 Other low back pain: Secondary | ICD-10-CM | POA: Diagnosis not present

## 2022-07-20 NOTE — Therapy (Signed)
OUTPATIENT PHYSICAL THERAPY LOWER EXTREMITY TREATMENT   Patient Name: Lance Dillon MRN: [id number] DOB:1940-07-06, 82 y.o., male Today's Date: 07/20/2022  END OF SESSION:  PT End of Session - 07/20/22 0804     Visit Number 3    Number of Visits 16    Date for PT Re-Evaluation 09/07/22    Authorization Type [employer]    PT Start Time 0805    PT Stop Time 0845    PT Time Calculation (min) 40 min    Activity Tolerance Patient tolerated treatment well    Behavior During Therapy [facility] for tasks assessed/performed             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   Bilateral: Noted on MRA of brain in November 2015 with collateral flow from posterior circulation and external collaterals reconstituting anterior circulation   Chronic kidney disease    Colon polyps 2004, 07, 14   COPD (chronic obstructive pulmonary disease) (HCC)    GERD (gastroesophageal reflux disease)    diet controlled, no meds   Glaucoma    left eye    Hx of radiation therapy 03/22/11 to 05/08/11   PSA recurrent carcinoma of prostate   Hypercholesterolemia    Hypertension    Macular degeneration of both eyes    Peripheral vascular disease (HCC) 07/2011   Bilateral SFA stenosis: Status post right femoropopliteal bypass - Dr. 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 CATH LAB;  Service: Cardiovascular;  Laterality: N/A;   ABDOMINAL  AORTOGRAM W/LOWER EXTREMITY N/A 12/20/2020   Procedure: ABDOMINAL AORTOGRAM W/LOWER EXTREMITY;  Surgeon: Maeola Harman, MD;  Location: [facility] INVASIVE CV LAB;  Service: Cardiovascular;  Laterality: N/A;   AMPUTATION Right 12/28/2020   Procedure: RIGHT 4TH TOE AMPUTATION;  Surgeon: Maeola Harman, MD;  Location: [facility] OR;  Service: Vascular;  Laterality: Right;   CARDIOVASCULAR STRESS TEST  07/06/2011   Evidence of mild ischemia in Basal Inferior and Mid Inferior regions, no significant wall motion abnormalities noted.   clamp to penis     for urinary control   COLONOSCOPY     COLONOSCOPY W/ BIOPSIES     multiple    CYSTOSCOPY W/ URETERAL STENT PLACEMENT Left 06/07/2015   Procedure: CYSTOSCOPY, RETROGRADE PYLEGRAM  WITH STENT PLACEMENT;  Surgeon: Alfredo Martinez, MD;  Location: [facility] ORS;  Service: Urology;  Laterality: Left;   CYSTOSCOPY WITH RETROGRADE PYELOGRAM, URETEROSCOPY AND STENT PLACEMENT Left 07/28/2015   Procedure: CYSTOSCOPY, URETEROSCOPY, WITH BASKET STONE REMOVAL;  Surgeon: Heloise Purpura, MD;  Location: [facility] ORS;  Service: Urology;  Laterality: Left;   ENDARTERECTOMY FEMORAL Right 12/21/2020   Procedure: RIGHT FEMORAL ENDARTERECTOMY;  Surgeon: Maeola Harman, MD;  Location: [facility] OR;  Service: Vascular;  Laterality: Right;   EYE SURGERY Right 08/2014   Cataract; removed bilat   FEMORAL-POPLITEAL BYPASS  GRAFT  07/19/2011   Procedure: BYPASS GRAFT FEMORAL-POPLITEAL ARTERY;  Surgeon: Pryor Ochoa, MD;  Location: [facility] OR;  Service: Vascular;  Laterality: Right;  Right Femoral - Popliteal  Bypss with saphenous vein   FEMORAL-POPLITEAL BYPASS GRAFT Right 12/21/2020   Procedure: RIGHT FEMORAL-POPLITEAL ARTERY BYPASS GRAFT WITH PTFE;  Surgeon: Maeola Harman, MD;  Location: [facility] OR;  Service: Vascular;  Laterality: Right;   HERNIA REPAIR     right side,lft   INTRAOPERATIVE ARTERIOGRAM  07/19/2011   Procedure: INTRA OPERATIVE ARTERIOGRAM;  Surgeon: Pryor Ochoa, MD;  Location: [facility] OR;  Service: Vascular;  Laterality: Right;   NM MYOVIEW LTD  07/2011   Normal EF, very small area of basal-mid inferior ischemia; LOW RISK   PENILE PROSTHESIS  REMOVAL     PENILE PROSTHESIS IMPLANT     PR VEIN BYPASS GRAFT,AORTO-FEM-POP  07/19/2011   Right  Fem/Pop BPG   PROSTATE BIOPSY     prostatectomy retropubic radical  07/15/2002   with nerve sparing, Gleason 3+4=7   Reclast  07/25/2013   RENAL DUPLEX  07/01/2012   Left renal artery 60-99% diameter reduction   TRANSTHORACIC ECHOCARDIOGRAM  01/2017   Normal EF and overall function   Patient Active Problem List   Diagnosis Date Noted   COVID-19 virus infection 02/10/2022   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   Bilateral low back pain without sciatica 01/19/2021   Cardiovascular symptoms 01/19/2021   Chronic kidney disease, stage 3a (HCC) 01/19/2021   Encounter for general adult medical examination with abnormal findings 01/19/2021   Gangrene of toe of right foot (HCC) 01/19/2021   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) 01/19/2021   Moderate persistent asthma with (acute) exacerbation 01/19/2021   Nephrosclerosis 01/19/2021   Osteoporosis 01/19/2021   Scoliosis 01/19/2021   Tobacco user 01/19/2021   Urinary incontinence 01/19/2021   Malnutrition of moderate degree 12/29/2020   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) 12/28/2020   Ischemic ulcer of foot, right, with fat layer exposed (HCC) 12/19/2020   COPD (chronic obstructive pulmonary disease) (HCC) 12/19/2020   Lumbar radiculopathy 05/28/2020   Posterior vitreous detachment of left eye 08/13/2019   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 08/13/2019   Retinal layer separation 08/13/2019   Primary open angle glaucoma of both eyes, severe stage 08/13/2019   Rhinitis, chronic 03/12/2018   Degeneration of lumbar intervertebral disc 08/28/2017    Lung field abnormal finding on examination 07/19/2017   COPD GOLD II   07/19/2017   Carotid occlusion, right 08/11/2015   Aneurysm, cerebral 08/11/2015   Hydronephrosis, left 06/06/2015   Kidney disease, chronic, stage II (GFR 60-89 ml/min) 06/06/2015   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   Dizziness 12/16/2013   Vertigo    Left renal artery stenosis - 70% by angiography; increased velocity on recent Dopplers (60-99%) 12/08/2012    Class: Diagnosis of   Hyperlipidemia LDL goal <70 12/08/2012   Personal history of colonic adenomas 11/15/2012   Pain in limb 02/13/2012   Peripheral vascular disease (HCC) 08/07/2011   Atherosclerosis of native artery of extremity with intermittent claudication (HCC) 06/13/2011   Prostate cancer (HCC) 02/16/2011   Primary hypertension 12/09/2010     PCP: Merri Brunette  REFERRING PROVIDER: [name]  REFERRING DIAG: Low back pain   THERAPY DIAG:  Other low back pain  Muscle weakness (generalized)  Other abnormalities of gait and mobility  Rationale for Evaluation and Treatment: Rehabilitation  ONSET DATE:     SUBJECTIVE:   SUBJECTIVE STATEMENT: Pt with no new complaints. States more soreness in afternoons at times after doing more walking .  Eval: Pt states ongoing pain in bil low back, into bil hips. He has had 2 or 3 epidurals. Did feel some relief with this, but not long lasting. He states much pain with standing and walking only a few minutes (about 10 )  causing him to need to sit down. Sitting and laying he has minimal pain. He was seen in PT previously, about 1 year ago. He also states weakness in his legs L >R and has fallen a couple times because of his leg giving out.  Did have recent injection in hip and SIJ, he is unable to determine if this helped.  He Uses 4 WW  for longer distances, so he can sit down.  Uses cane the rest of the time, using cane today.    PERTINENT HISTORY:  HTN, prostate CA, Osteoporosis, COPD, CKD, brain aneurysm, previous stroke?    PAIN:  Are you having pain? Yes: NPRS scale: 8-10/10 Pain location: Bil low back, into hips  Pain description: sore, dull with sharp shooting pains  Aggravating factors: standing Relieving factors: rest, laying down, sitting.   PRECAUTIONS: None  WEIGHT BEARING RESTRICTIONS: No  FALLS:  Has patient fallen in last 6 months? Yes. Number of falls 2 in the last year , L leg giving out, states weakness or pain?    PLOF: Independent  PATIENT GOALS:  Decreased pain   NEXT MD VISIT:   OBJECTIVE:   DIAGNOSTIC FINDINGS:   PATIENT SURVEYS:    COGNITION: Overall cognitive status: Within functional limits for tasks assessed     SENSATION: WFL  EDEMA:    POSTURE:    standing: noted scoliosis, and trunk shift to R.   PALPATION: Tenderness in L >R mid and low lumbar, Tenderness in bil Musculature, and into Bil SIJ.    LOWER EXTREMITY ROM: Lumbar ROM:   flexion: mod limitation,  Ext: significant limitation,  SB: mod/significant limitation -  Hips: WFL,   Knees: WFL  LOWER EXTREMITY MMT:  MMT Left eval Right  eval  Hip flexion 4- 4-  Hip extension    Hip abduction 4- 4-  Hip adduction    Hip internal rotation    Hip external rotation 4- 4-  Knee flexion 4- 4+  Knee extension 4 5  Ankle dorsiflexion    Ankle plantarflexion 3- unable to do HR (bil)  3- 3- unable to do HR (bil)   Ankle inversion    Ankle eversion     (Blank rows = not tested)  LOWER EXTREMITY SPECIAL TESTS:    GAIT: Distance walked: 100 ft  Assistive device utilized: Single point cane Level of assistance: Modified independence Comments: Decreased foot clearance on L, decreased hip and knee flexion on L, slow speed, poor balance,    TODAY'S TREATMENT:  DATE:   07/20/2022 Therapeutic Exercise: Aerobic:  Supine:    Supine march with TB and TA for control of L leg x 15;  Seated:  LAQ 2 x 10 bil (2.5 lb on R);   Sit to stand (higher mat table ) 2 x 5 ;  Standing:  March x 20; Step up 4 in x 10 bil;  stairs, up/down 5 steps with education on leading with L on the way down, step to pattern;  Stretches:  LTR x 10; seated HSS 30 sec x 3 bil;  SKTC 30 sec x 3 bil;  Neuromuscular Re-education: Manual Therapy: Therapeutic Activity: Self Care    Therapeutic Exercise: Aerobic:  Supine:  SLR 2 x 5 bil with TA:  Supine march with TA for control of L leg x 15;  Seated:  LAQ 2 x 10 bil (2.5 lb on R);   Sit to stand (higher mat table ) 2 x 5 ;  Standing: March x 20;  Stretches:  LTR x 10; seated HSS 30 sec x 3 bil;  Seated fwd flexion 15 sec x 4;  Neuromuscular Re-education: Manual Therapy: Therapeutic Activity: Self Care:   07/13/22: Ther ex:  Review for SLR, s/l Hip abd,  s/l clams,  Sit to stand  (higher surface), LTR, and pelvic tilts   PATIENT EDUCATION:  Education details:  reviewed HEP Person educated: Patient Education method: Explanation, Demonstration, Tactile cues, Verbal cues, and Handouts Education comprehension: verbalized understanding, returned demonstration, verbal cues required, tactile cues required, and needs further education   HOME EXERCISE PROGRAM:  Access Code: [email or web address]    CLINICAL IMPRESSION: 07/20/2022 Discussed trying some stretching/mobility and heating pad later in the day if/when he sore to decrease pain. Pt to benefit from continued strength and stability for hips, LE and core.    Eval: Patient presents with primary complaint of  pain in bil low back. He has ongoing, significant pain and very low tolerance for standing and walking due to pain. He has much weakness in L LE, and poor gait mechanics, with decreased clearance of L  foot/LE. He will benefit from strengthening as able for L leg, gait training, and education on pain management for low back pain.  Pt with decreased ability for full functional activities. Pt will  benefit from skilled PT to improve deficits and pain and to return to PLOF.   OBJECTIVE IMPAIRMENTS: Abnormal gait, decreased activity tolerance, decreased balance, decreased knowledge of use of DME, decreased mobility, difficulty walking, decreased ROM, decreased strength, improper body mechanics, and pain.   ACTIVITY LIMITATIONS: lifting, bending, standing, squatting, stairs, transfers, dressing, hygiene/grooming, and locomotion level  PARTICIPATION LIMITATIONS: meal prep, shopping, and community activity  PERSONAL FACTORS: 1 comorbidity: possible/previous stroke, L weakness  are also affecting patient's functional outcome.   REHAB POTENTIAL: Fair chronic pain  CLINICAL DECISION MAKING: Stable/uncomplicated  EVALUATION COMPLEXITY: Low   GOALS: Goals reviewed with patient? Yes  SHORT TERM GOALS: Target date: 07/27/2022    Pt to be independent with initial HEP  Goal status: INITIAL    LONG TERM GOALS: Target date: 09/07/2022   Pt to be independent with final HEP  Goal status: INITIAL  2.  Pt to demo improved strength of L LE to at least 4/5 to improve stability and gait.   Goal status: INITIAL  3.  Pt to demo improved ambulation with LRAD, with improved foot and LE clearance on L, for at least 150 ft, and balance WFL,  to improve safety and falls.  Goal status: INITIAL  4. Pt to be independent with self management/pain relief techniques for low back pain.      PLAN:  PT FREQUENCY: 1-2x/week  PT DURATION: 8 weeks  PLANNED INTERVENTIONS: Therapeutic exercises, Therapeutic activity, Neuromuscular re-education, Patient/Family education, Self Care, Joint mobilization, Joint manipulation, Stair training, Orthotic/Fit training, DME instructions, Aquatic Therapy, Dry Needling,  Electrical stimulation, Cryotherapy, Moist heat, Taping, Ultrasound, Ionotophoresis 4mg /ml Dexamethasone, Manual therapy,  Vasopneumatic device, Traction, Spinal manipulation, Spinal mobilization,Balance training, Gait training,   PLAN FOR NEXT SESSION: LE strength, gait training, low back decompression and mobility    Sedalia Muta, PT, DPT 1:57 PM  07/20/22

## 2022-07-24 ENCOUNTER — Encounter: Payer: Self-pay | Admitting: Physical Therapy

## 2022-07-24 ENCOUNTER — Ambulatory Visit: Payer: Medicare HMO | Admitting: Physical Therapy

## 2022-07-24 DIAGNOSIS — M6281 Muscle weakness (generalized): Secondary | ICD-10-CM | POA: Diagnosis not present

## 2022-07-24 DIAGNOSIS — M5459 Other low back pain: Secondary | ICD-10-CM

## 2022-07-24 DIAGNOSIS — R2689 Other abnormalities of gait and mobility: Secondary | ICD-10-CM

## 2022-07-24 NOTE — Therapy (Signed)
OUTPATIENT PHYSICAL THERAPY LOWER EXTREMITY TREATMENT   Patient Name: Lance Dillon MRN: [id number] DOB:11-18-1940, 82 y.o., male Today's Date: 07/24/2022  END OF SESSION:  PT End of Session - 07/24/22 0919     Visit Number 4    Number of Visits 16    Date for PT Re-Evaluation 09/07/22    Authorization Type [employer]    PT Start Time [phone]    PT Stop Time 0928    PT Time Calculation (min) 42 min    Activity Tolerance Patient tolerated treatment well    Behavior During Therapy [facility] for tasks assessed/performed              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   Bilateral: Noted on MRA of brain in November 2015 with collateral flow from posterior circulation and external collaterals reconstituting anterior circulation   Chronic kidney disease    Colon polyps 2004, 07, 14   COPD (chronic obstructive pulmonary disease) (HCC)    GERD (gastroesophageal reflux disease)    diet controlled, no meds   Glaucoma    left eye    Hx of radiation therapy 03/22/11 to 05/08/11   PSA recurrent carcinoma of prostate   Hypercholesterolemia    Hypertension    Macular degeneration of both eyes    Peripheral vascular disease (HCC) 07/2011   Bilateral SFA stenosis: Status post right femoropopliteal bypass - Dr. 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   Right-sided 19 mm x 8 mm fusiform aneurysm    Prostate cancer (HCC) 2004   Renal artery stenosis (HCC)    70% left   Stones in the urinary tract    Stroke [facility])    Urinary incontinence    Past Surgical History:  Procedure Laterality Date   ABDOMINAL AORTAGRAM N/A 06/20/2011   Procedure: ABDOMINAL Ronny Flurry;  Surgeon: Nada Libman, MD;  Location: [facility] CATH LAB;  Service: Cardiovascular;  Laterality: N/A;   ABDOMINAL  AORTOGRAM W/LOWER EXTREMITY N/A 12/20/2020   Procedure: ABDOMINAL AORTOGRAM W/LOWER EXTREMITY;  Surgeon: Maeola Harman, MD;  Location: [facility] INVASIVE CV LAB;  Service: Cardiovascular;  Laterality: N/A;   AMPUTATION Right 12/28/2020   Procedure: RIGHT 4TH TOE AMPUTATION;  Surgeon: Maeola Harman, MD;  Location: [facility] OR;  Service: Vascular;  Laterality: Right;   CARDIOVASCULAR STRESS TEST  07/06/2011   Evidence of mild ischemia in Basal Inferior and Mid Inferior regions, no significant wall motion abnormalities noted.   clamp to penis     for urinary control   COLONOSCOPY     COLONOSCOPY W/ BIOPSIES     multiple    CYSTOSCOPY W/ URETERAL STENT PLACEMENT Left 06/07/2015   Procedure: CYSTOSCOPY, RETROGRADE PYLEGRAM  WITH STENT PLACEMENT;  Surgeon: Alfredo Martinez, MD;  Location: [facility] ORS;  Service: Urology;  Laterality: Left;   CYSTOSCOPY WITH RETROGRADE PYELOGRAM, URETEROSCOPY AND STENT PLACEMENT Left 07/28/2015   Procedure: CYSTOSCOPY, URETEROSCOPY, WITH BASKET STONE REMOVAL;  Surgeon: Heloise Purpura, MD;  Location: [facility] ORS;  Service: Urology;  Laterality: Left;   ENDARTERECTOMY FEMORAL Right 12/21/2020   Procedure: RIGHT FEMORAL ENDARTERECTOMY;  Surgeon: Maeola Harman, MD;  Location: [facility] OR;  Service: Vascular;  Laterality: Right;   EYE SURGERY Right 08/2014   Cataract; removed bilat   FEMORAL-POPLITEAL  BYPASS GRAFT  07/19/2011   Procedure: BYPASS GRAFT FEMORAL-POPLITEAL ARTERY;  Surgeon: Pryor Ochoa, MD;  Location: [facility] OR;  Service: Vascular;  Laterality: Right;  Right Femoral - Popliteal  Bypss with saphenous vein   FEMORAL-POPLITEAL BYPASS GRAFT Right 12/21/2020   Procedure: RIGHT FEMORAL-POPLITEAL ARTERY BYPASS GRAFT WITH PTFE;  Surgeon: Maeola Harman, MD;  Location: [facility] OR;  Service: Vascular;  Laterality: Right;   HERNIA REPAIR     right side,lft   INTRAOPERATIVE ARTERIOGRAM  07/19/2011   Procedure: INTRA OPERATIVE ARTERIOGRAM;  Surgeon: Pryor Ochoa, MD;  Location: [facility] OR;  Service: Vascular;  Laterality: Right;   NM MYOVIEW LTD  07/2011   Normal EF, very small area of basal-mid inferior ischemia; LOW RISK   PENILE PROSTHESIS  REMOVAL     PENILE PROSTHESIS IMPLANT     PR VEIN BYPASS GRAFT,AORTO-FEM-POP  07/19/2011   Right  Fem/Pop BPG   PROSTATE BIOPSY     prostatectomy retropubic radical  07/15/2002   with nerve sparing, Gleason 3+4=7   Reclast  07/25/2013   RENAL DUPLEX  07/01/2012   Left renal artery 60-99% diameter reduction   TRANSTHORACIC ECHOCARDIOGRAM  01/2017   Normal EF and overall function   Patient Active Problem List   Diagnosis Date Noted   COVID-19 virus infection 02/10/2022   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   Bilateral low back pain without sciatica 01/19/2021   Cardiovascular symptoms 01/19/2021   Chronic kidney disease, stage 3a (HCC) 01/19/2021   Encounter for general adult medical examination with abnormal findings 01/19/2021   Gangrene of toe of right foot (HCC) 01/19/2021   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) 01/19/2021   Moderate persistent asthma with (acute) exacerbation 01/19/2021   Nephrosclerosis 01/19/2021   Osteoporosis 01/19/2021   Scoliosis 01/19/2021   Tobacco user 01/19/2021   Urinary incontinence 01/19/2021   Malnutrition of moderate degree 12/29/2020   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) 12/28/2020   Ischemic ulcer of foot, right, with fat layer exposed (HCC) 12/19/2020   COPD (chronic obstructive pulmonary disease) (HCC) 12/19/2020   Lumbar radiculopathy 05/28/2020   Posterior vitreous detachment of left eye 08/13/2019   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 08/13/2019   Retinal layer separation 08/13/2019   Primary open angle glaucoma of both eyes, severe stage 08/13/2019   Rhinitis, chronic 03/12/2018   Degeneration of lumbar intervertebral disc 08/28/2017    Lung field abnormal finding on examination 07/19/2017   COPD GOLD II   07/19/2017   Carotid occlusion, right 08/11/2015   Aneurysm, cerebral 08/11/2015   Hydronephrosis, left 06/06/2015   Kidney disease, chronic, stage II (GFR 60-89 ml/min) 06/06/2015   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   Dizziness 12/16/2013   Vertigo    Left renal artery stenosis - 70% by angiography; increased velocity on recent Dopplers (60-99%) 12/08/2012    Class: Diagnosis of   Hyperlipidemia LDL goal <70 12/08/2012   Personal history of colonic adenomas 11/15/2012   Pain in limb 02/13/2012   Peripheral vascular disease (HCC) 08/07/2011   Atherosclerosis of native artery of extremity with intermittent claudication (HCC) 06/13/2011   Prostate cancer (HCC) 02/16/2011   Primary hypertension 12/09/2010     PCP: Merri Brunette  REFERRING PROVIDER: [name]  REFERRING DIAG: Low back pain   THERAPY  DIAG:  Other low back pain  Muscle weakness (generalized)  Other abnormalities of gait and mobility  Rationale for Evaluation and Treatment: Rehabilitation  ONSET DATE:     SUBJECTIVE:   SUBJECTIVE STATEMENT: Pt States continued pain in back.   Eval: Pt states ongoing pain in bil low back, into bil hips. He has had 2 or 3 epidurals. Did feel some relief with this, but not long lasting. He states much pain with standing and walking only a few minutes (about 10 )  causing him to need to sit down. Sitting and laying he has minimal pain. He was seen in PT previously, about 1 year ago. He also states weakness in his legs L >R and has fallen a couple times because of his leg giving out.  Did have recent injection in hip and SIJ, he is unable to determine if this helped.  He Uses 4 WW for longer distances, so he can sit down.  Uses cane the rest  of the time, using cane today.    PERTINENT HISTORY:  HTN, prostate CA, Osteoporosis, COPD, CKD, brain aneurysm, previous stroke?    PAIN:  Are you having pain? Yes: NPRS scale: 8-10/10 Pain location: Bil low back, into hips  Pain description: sore, dull with sharp shooting pains  Aggravating factors: standing Relieving factors: rest, laying down, sitting.   PRECAUTIONS: None  WEIGHT BEARING RESTRICTIONS: No  FALLS:  Has patient fallen in last 6 months? Yes. Number of falls 2 in the last year , L leg giving out, states weakness or pain?    PLOF: Independent  PATIENT GOALS:  Decreased pain   NEXT MD VISIT:   OBJECTIVE:   DIAGNOSTIC FINDINGS:   PATIENT SURVEYS:    COGNITION: Overall cognitive status: Within functional limits for tasks assessed     SENSATION: WFL  EDEMA:    POSTURE:    standing: noted scoliosis, and trunk shift to R.   PALPATION: Tenderness in L >R mid and low lumbar, Tenderness in bil Musculature, and into Bil SIJ.    LOWER EXTREMITY ROM: Lumbar ROM:   flexion: mod limitation,  Ext: significant limitation,  SB: mod/significant limitation -  Hips: WFL,   Knees: WFL  LOWER EXTREMITY MMT:  MMT Left eval Right  eval  Hip flexion 4- 4-  Hip extension    Hip abduction 4- 4-  Hip adduction    Hip internal rotation    Hip external rotation 4- 4-  Knee flexion 4- 4+  Knee extension 4 5  Ankle dorsiflexion    Ankle plantarflexion 3- unable to do HR (bil)  3- 3- unable to do HR (bil)   Ankle inversion    Ankle eversion     (Blank rows = not tested)  LOWER EXTREMITY SPECIAL TESTS:    GAIT: Distance walked: 100 ft  Assistive device utilized: Single point cane Level of assistance: Modified independence Comments: Decreased foot clearance on L, decreased hip and knee flexion on L, slow speed, poor balance,    TODAY'S TREATMENT:  DATE:   07/24/2022 Therapeutic Exercise: Aerobic:  bike L1 x 5 min  Supine:    Seated:  LAQ 2 x 10 bil (2 lb );   Sit to stand regular chair, no UE support,  2 x 5 ; ankle pumps on L x 15;  Standing:  March x 20;  tandem stance 30 sec  bil;   Stretches:   Neuromuscular Re-education: Manual Therapy: Therapeutic Activity: Self Care            Gait:  ambulation with SPC, with cuing for increasing hip and knee flexion to clear foot on floor;  Practice with ace wrap for DF , to improve foot clearance 45 ft x 8, and discussion on possibly obtaining a foot up orthosis.   Therapeutic Exercise: Aerobic:  Supine:  SLR 2 x 5 bil with TA:  Supine march with TA for control of L leg x 15;  Seated:  LAQ 2 x 10 bil (2.5 lb on R);   Sit to stand (higher mat table ) 2 x 5 ;  Standing: March x 20; step ups  Stretches:  LTR x 10; seated HSS 30 sec x 3 bil;  Seated fwd flexion 15 sec x 4;  Neuromuscular Re-education: Manual Therapy: Therapeutic Activity: Self Care:   07/13/22: Ther ex:  Review for SLR, s/l Hip abd,  s/l clams,  Sit to stand  (higher surface), LTR, and pelvic tilts   PATIENT EDUCATION:  Education details:  reviewed HEP Person educated: Patient Education method: Explanation, Demonstration, Tactile cues, Verbal cues, and Handouts Education comprehension: verbalized understanding, returned demonstration, verbal cues required, tactile cues required, and needs further education   HOME EXERCISE PROGRAM:  Access Code: [email or web address]    CLINICAL IMPRESSION: 07/24/2022 Pt with continued pain in back/unchanged. Focus today for LE strength and gait. Discussed safety with very low foot clearance. Discussed option for foot up orthosis, pt given information. Unlikely that he needs true AFO, but would benefit from having some assist for foot clearance. Pt with low tolerance for standing activity, needing to sit and take rest breaks, due to back pain and leg weakness.    Eval:  Patient presents with primary complaint of  pain in bil low back. He has ongoing, significant pain and very low tolerance for standing and walking due to pain. He has much weakness in L LE, and poor gait mechanics, with decreased clearance of L foot/LE. He will benefit from strengthening as able for L leg, gait training, and education on pain management for low back pain.  Pt with decreased ability for full functional activities. Pt will  benefit from skilled PT to improve deficits and pain and to return to PLOF.   OBJECTIVE IMPAIRMENTS: Abnormal gait, decreased activity tolerance, decreased balance, decreased knowledge of use of DME, decreased mobility, difficulty walking, decreased ROM, decreased strength, improper body mechanics, and pain.   ACTIVITY LIMITATIONS: lifting, bending, standing, squatting, stairs, transfers, dressing, hygiene/grooming, and locomotion level  PARTICIPATION LIMITATIONS: meal prep, shopping, and community activity  PERSONAL FACTORS: 1 comorbidity: possible/previous stroke, L weakness  are also affecting patient's functional outcome.   REHAB POTENTIAL: Fair chronic pain  CLINICAL DECISION MAKING: Stable/uncomplicated  EVALUATION COMPLEXITY: Low   GOALS: Goals reviewed with patient? Yes  SHORT TERM GOALS: Target date: 07/27/2022    Pt to be independent with initial HEP  Goal status: INITIAL    LONG TERM GOALS: Target date: 09/07/2022   Pt to be independent with final HEP  Goal status: INITIAL  2.  Pt to demo improved strength of L LE to at least 4/5 to improve stability and gait.   Goal status: INITIAL  3.  Pt to demo improved ambulation with LRAD, with improved foot and LE clearance on L, for at least 150 ft, and balance WFL,  to improve safety and falls.   Goal status: INITIAL  4. Pt to be independent with self management/pain relief techniques for low back pain.      PLAN:  PT FREQUENCY: 1-2x/week  PT DURATION: 8 weeks  PLANNED  INTERVENTIONS: Therapeutic exercises, Therapeutic activity, Neuromuscular re-education, Patient/Family education, Self Care, Joint mobilization, Joint manipulation, Stair training, Orthotic/Fit training, DME instructions, Aquatic Therapy, Dry Needling, Electrical stimulation, Cryotherapy, Moist heat, Taping, Ultrasound, Ionotophoresis 4mg /ml Dexamethasone, Manual therapy,  Vasopneumatic device, Traction, Spinal manipulation, Spinal mobilization,Balance training, Gait training,   PLAN FOR NEXT SESSION: LE strength, gait training, low back decompression and mobility    Sedalia Muta, PT, DPT 3:30 PM  07/24/22

## 2022-07-27 ENCOUNTER — Ambulatory Visit: Payer: Medicare HMO | Admitting: Physical Therapy

## 2022-07-27 ENCOUNTER — Encounter: Payer: Self-pay | Admitting: Physical Therapy

## 2022-07-27 DIAGNOSIS — M6281 Muscle weakness (generalized): Secondary | ICD-10-CM | POA: Diagnosis not present

## 2022-07-27 DIAGNOSIS — M5459 Other low back pain: Secondary | ICD-10-CM

## 2022-07-27 DIAGNOSIS — R2689 Other abnormalities of gait and mobility: Secondary | ICD-10-CM

## 2022-07-27 NOTE — Therapy (Signed)
OUTPATIENT PHYSICAL THERAPY LOWER EXTREMITY TREATMENT   Patient Name: DENHAM WILDY MRN: [id number] DOB:[birth year], 82 y.o., male Today's Date: 07/27/2022  END OF SESSION:  PT End of Session - 07/27/22 1344     Visit Number 5    Number of Visits 16    Date for PT Re-Evaluation 09/07/22    Authorization Type [employer]    PT Start Time 1345    PT Stop Time 1425    PT Time Calculation (min) 40 min    Activity Tolerance Patient tolerated treatment well    Behavior During Therapy [facility] for tasks assessed/performed              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   Bilateral: Noted on MRA of brain in November 2015 with collateral flow from posterior circulation and external collaterals reconstituting anterior circulation   Chronic kidney disease    Colon polyps 2004, 07, 14   COPD (chronic obstructive pulmonary disease) (HCC)    GERD (gastroesophageal reflux disease)    diet controlled, no meds   Glaucoma    left eye    Hx of radiation therapy 03/22/11 to 05/08/11   PSA recurrent carcinoma of prostate   Hypercholesterolemia    Hypertension    Macular degeneration of both eyes    Peripheral vascular disease (HCC) 07/2011   Bilateral SFA stenosis: Status post right femoropopliteal bypass - Dr. 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   Right-sided 19 mm x 8 mm fusiform aneurysm    Prostate cancer (HCC) 2004   Renal artery stenosis (HCC)    70% left   Stones in the urinary tract    Stroke [facility])    Urinary incontinence    Past Surgical History:  Procedure Laterality Date   ABDOMINAL AORTAGRAM N/A 06/20/2011   Procedure: ABDOMINAL Ronny Flurry;  Surgeon: Nada Libman, MD;  Location: [facility] CATH LAB;  Service: Cardiovascular;  Laterality: N/A;   ABDOMINAL  AORTOGRAM W/LOWER EXTREMITY N/A 12/20/2020   Procedure: ABDOMINAL AORTOGRAM W/LOWER EXTREMITY;  Surgeon: Maeola Harman, MD;  Location: [facility] INVASIVE CV LAB;  Service: Cardiovascular;  Laterality: N/A;   AMPUTATION Right 12/28/2020   Procedure: RIGHT 4TH TOE AMPUTATION;  Surgeon: Maeola Harman, MD;  Location: [facility] OR;  Service: Vascular;  Laterality: Right;   CARDIOVASCULAR STRESS TEST  07/06/2011   Evidence of mild ischemia in Basal Inferior and Mid Inferior regions, no significant wall motion abnormalities noted.   clamp to penis     for urinary control   COLONOSCOPY     COLONOSCOPY W/ BIOPSIES     multiple    CYSTOSCOPY W/ URETERAL STENT PLACEMENT Left 06/07/2015   Procedure: CYSTOSCOPY, RETROGRADE PYLEGRAM  WITH STENT PLACEMENT;  Surgeon: Alfredo Martinez, MD;  Location: [facility] ORS;  Service: Urology;  Laterality: Left;   CYSTOSCOPY WITH RETROGRADE PYELOGRAM, URETEROSCOPY AND STENT PLACEMENT Left 07/28/2015   Procedure: CYSTOSCOPY, URETEROSCOPY, WITH BASKET STONE REMOVAL;  Surgeon: Heloise Purpura, MD;  Location: [facility] ORS;  Service: Urology;  Laterality: Left;   ENDARTERECTOMY FEMORAL Right 12/21/2020   Procedure: RIGHT FEMORAL ENDARTERECTOMY;  Surgeon: Maeola Harman, MD;  Location: [facility] OR;  Service: Vascular;  Laterality: Right;   EYE SURGERY Right 08/2014   Cataract; removed bilat   FEMORAL-POPLITEAL  BYPASS GRAFT  07/19/2011   Procedure: BYPASS GRAFT FEMORAL-POPLITEAL ARTERY;  Surgeon: Pryor Ochoa, MD;  Location: [facility] OR;  Service: Vascular;  Laterality: Right;  Right Femoral - Popliteal  Bypss with saphenous vein   FEMORAL-POPLITEAL BYPASS GRAFT Right 12/21/2020   Procedure: RIGHT FEMORAL-POPLITEAL ARTERY BYPASS GRAFT WITH PTFE;  Surgeon: Maeola Harman, MD;  Location: [facility] OR;  Service: Vascular;  Laterality: Right;   HERNIA REPAIR     right side,lft   INTRAOPERATIVE ARTERIOGRAM  07/19/2011   Procedure: INTRA OPERATIVE ARTERIOGRAM;  Surgeon: Pryor Ochoa, MD;  Location: [facility] OR;  Service: Vascular;  Laterality: Right;   NM MYOVIEW LTD  07/2011   Normal EF, very small area of basal-mid inferior ischemia; LOW RISK   PENILE PROSTHESIS  REMOVAL     PENILE PROSTHESIS IMPLANT     PR VEIN BYPASS GRAFT,AORTO-FEM-POP  07/19/2011   Right  Fem/Pop BPG   PROSTATE BIOPSY     prostatectomy retropubic radical  07/15/2002   with nerve sparing, Gleason 3+4=7   Reclast  07/25/2013   RENAL DUPLEX  07/01/2012   Left renal artery 60-99% diameter reduction   TRANSTHORACIC ECHOCARDIOGRAM  01/2017   Normal EF and overall function   Patient Active Problem List   Diagnosis Date Noted   COVID-19 virus infection 02/10/2022   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   Bilateral low back pain without sciatica 01/19/2021   Cardiovascular symptoms 01/19/2021   Chronic kidney disease, stage 3a (HCC) 01/19/2021   Encounter for general adult medical examination with abnormal findings 01/19/2021   Gangrene of toe of right foot (HCC) 01/19/2021   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) 01/19/2021   Moderate persistent asthma with (acute) exacerbation 01/19/2021   Nephrosclerosis 01/19/2021   Osteoporosis 01/19/2021   Scoliosis 01/19/2021   Tobacco user 01/19/2021   Urinary incontinence 01/19/2021   Malnutrition of moderate degree 12/29/2020   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) 12/28/2020   Ischemic ulcer of foot, right, with fat layer exposed (HCC) 12/19/2020   COPD (chronic obstructive pulmonary disease) (HCC) 12/19/2020   Lumbar radiculopathy 05/28/2020   Posterior vitreous detachment of left eye 08/13/2019   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 08/13/2019   Retinal layer separation 08/13/2019   Primary open angle glaucoma of both eyes, severe stage 08/13/2019   Rhinitis, chronic 03/12/2018   Degeneration of lumbar intervertebral disc 08/28/2017    Lung field abnormal finding on examination 07/19/2017   COPD GOLD II   07/19/2017   Carotid occlusion, right 08/11/2015   Aneurysm, cerebral 08/11/2015   Hydronephrosis, left 06/06/2015   Kidney disease, chronic, stage II (GFR 60-89 ml/min) 06/06/2015   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   Dizziness 12/16/2013   Vertigo    Left renal artery stenosis - 70% by angiography; increased velocity on recent Dopplers (60-99%) 12/08/2012    Class: Diagnosis of   Hyperlipidemia LDL goal <70 12/08/2012   Personal history of colonic adenomas 11/15/2012   Pain in limb 02/13/2012   Peripheral vascular disease (HCC) 08/07/2011   Atherosclerosis of native artery of extremity with intermittent claudication (HCC) 06/13/2011   Prostate cancer (HCC) 02/16/2011   Primary hypertension 12/09/2010     PCP: Merri Brunette  REFERRING PROVIDER: [name]  REFERRING DIAG: Low back pain   THERAPY  DIAG:  Other low back pain  Muscle weakness (generalized)  Other abnormalities of gait and mobility  Rationale for Evaluation and Treatment: Rehabilitation  ONSET DATE:     SUBJECTIVE:   SUBJECTIVE STATEMENT: Pt States continued pain in back. He did obtain foot-up orthosis.   Eval: Pt states ongoing pain in bil low back, into bil hips. He has had 2 or 3 epidurals. Did feel some relief with this, but not long lasting. He states much pain with standing and walking only a few minutes (about 10 )  causing him to need to sit down. Sitting and laying he has minimal pain. He was seen in PT previously, about 1 year ago. He also states weakness in his legs L >R and has fallen a couple times because of his leg giving out.  Did have recent injection in hip and SIJ, he is unable to determine if this helped.  He Uses 4 WW for longer distances, so he  can sit down.  Uses cane the rest of the time, using cane today.    PERTINENT HISTORY:  HTN, prostate CA, Osteoporosis, COPD, CKD, brain aneurysm, previous stroke?    PAIN:  Are you having pain? Yes: NPRS scale: 8-10/10 Pain location: Bil low back, into hips  Pain description: sore, dull with sharp shooting pains  Aggravating factors: standing Relieving factors: rest, laying down, sitting.   PRECAUTIONS: None  WEIGHT BEARING RESTRICTIONS: No  FALLS:  Has patient fallen in last 6 months? Yes. Number of falls 2 in the last year , L leg giving out, states weakness or pain?    PLOF: Independent  PATIENT GOALS:  Decreased pain   NEXT MD VISIT:   OBJECTIVE:   DIAGNOSTIC FINDINGS:   PATIENT SURVEYS:    COGNITION: Overall cognitive status: Within functional limits for tasks assessed     SENSATION: WFL  EDEMA:    POSTURE:    standing: noted scoliosis, and trunk shift to R.   PALPATION: Tenderness in L >R mid and low lumbar, Tenderness in bil Musculature, and into Bil SIJ.    LOWER EXTREMITY ROM: Lumbar ROM:   flexion: mod limitation,  Ext: significant limitation,  SB: mod/significant limitation -  Hips: WFL,   Knees: WFL  LOWER EXTREMITY MMT:  MMT Left eval Right  eval  Hip flexion 4- 4-  Hip extension    Hip abduction 4- 4-  Hip adduction    Hip internal rotation    Hip external rotation 4- 4-  Knee flexion 4- 4+  Knee extension 4 5  Ankle dorsiflexion    Ankle plantarflexion 3- unable to do HR (bil)  3- 3- unable to do HR (bil)   Ankle inversion    Ankle eversion     (Blank rows = not tested)  LOWER EXTREMITY SPECIAL TESTS:    GAIT: Distance walked: 100 ft  Assistive device utilized: Single point cane Level of assistance: Modified independence Comments: Decreased foot clearance on L, decreased hip and knee flexion on L, slow speed, poor balance,    TODAY'S TREATMENT:  DATE:   07/27/2022 Therapeutic Exercise: Aerobic:   Supine:   supine march with Gtb x 20;  clams GTB x 20 with TA ; SLR 2 x 5 bil;  Seated:  LAQ 2 x 10 bil (2 lb );   Sit to stand regular chair, no UE support 2 x 5;  Standing:   Stretches:   Neuromuscular Re-education: Manual Therapy: Therapeutic Activity: Self Care            Gait:  ambulation with SPC, with cuing for increasing hip and knee flexion to clear foot on floor;  Practice with wearing his new foot up orthosis/brace today, with very mild improvement of foot clearance- still much difficulty with limb advancement.    Therapeutic Exercise: Aerobic:  Supine:  SLR 2 x 5 bil with TA:  Supine march with TA for control of L leg x 15;  Seated:  LAQ 2 x 10 bil (2.5 lb on R);   Sit to stand (higher mat table ) 2 x 5 ;  Standing: March x 20; step ups  Stretches:  LTR x 10; seated HSS 30 sec x 3 bil;  Seated fwd flexion 15 sec x 4;  Neuromuscular Re-education: Manual Therapy: Therapeutic Activity: Self Care:   07/13/22: Ther ex:  Review for SLR, s/l Hip abd,  s/l clams,  Sit to stand  (higher surface), LTR, and pelvic tilts   PATIENT EDUCATION:  Education details:  reviewed HEP Person educated: Patient Education method: Explanation, Demonstration, Tactile cues, Verbal cues, and Handouts Education comprehension: verbalized understanding, returned demonstration, verbal cues required, tactile cues required, and needs further education   HOME EXERCISE PROGRAM:  Access Code: [email or web address]    CLINICAL IMPRESSION: 07/27/2022 Pt with continued pain in back/unchanged. Focus today for LE strength and gait. Discussed safety with very low foot clearance. Discussed option for foot up orthosis, pt given information. Unlikely that he needs true AFO, but would benefit from having some assist for foot clearance. Pt with low tolerance for standing activity, needing to sit and take rest breaks, due to  back pain and leg weakness.    Eval: Patient presents with primary complaint of  pain in bil low back. He has ongoing, significant pain and very low tolerance for standing and walking due to pain. He has much weakness in L LE, and poor gait mechanics, with decreased clearance of L foot/LE. He will benefit from strengthening as able for L leg, gait training, and education on pain management for low back pain.  Pt with decreased ability for full functional activities. Pt will  benefit from skilled PT to improve deficits and pain and to return to PLOF.   OBJECTIVE IMPAIRMENTS: Abnormal gait, decreased activity tolerance, decreased balance, decreased knowledge of use of DME, decreased mobility, difficulty walking, decreased ROM, decreased strength, improper body mechanics, and pain.   ACTIVITY LIMITATIONS: lifting, bending, standing, squatting, stairs, transfers, dressing, hygiene/grooming, and locomotion level  PARTICIPATION LIMITATIONS: meal prep, shopping, and community activity  PERSONAL FACTORS: 1 comorbidity: possible/previous stroke, L weakness  are also affecting patient's functional outcome.   REHAB POTENTIAL: Fair chronic pain  CLINICAL DECISION MAKING: Stable/uncomplicated  EVALUATION COMPLEXITY: Low   GOALS: Goals reviewed with patient? Yes  SHORT TERM GOALS: Target date: 07/27/2022    Pt to be independent with initial HEP  Goal status: INITIAL    LONG TERM GOALS: Target date: 09/07/2022   Pt to be independent with final HEP  Goal status: INITIAL  2.  Pt to demo improved strength  of L LE to at least 4/5 to improve stability and gait.   Goal status: INITIAL  3.  Pt to demo improved ambulation with LRAD, with improved foot and LE clearance on L, for at least 150 ft, and balance WFL,  to improve safety and falls.   Goal status: INITIAL  4. Pt to be independent with self management/pain relief techniques for low back pain.      PLAN:  PT FREQUENCY:  1-2x/week  PT DURATION: 8 weeks  PLANNED INTERVENTIONS: Therapeutic exercises, Therapeutic activity, Neuromuscular re-education, Patient/Family education, Self Care, Joint mobilization, Joint manipulation, Stair training, Orthotic/Fit training, DME instructions, Aquatic Therapy, Dry Needling, Electrical stimulation, Cryotherapy, Moist heat, Taping, Ultrasound, Ionotophoresis 4mg /ml Dexamethasone, Manual therapy,  Vasopneumatic device, Traction, Spinal manipulation, Spinal mobilization,Balance training, Gait training,   PLAN FOR NEXT SESSION: LE strength, gait training, low back decompression and mobility    Sedalia Muta, PT, DPT 2:25 PM  07/27/22

## 2022-08-01 ENCOUNTER — Encounter: Payer: Self-pay | Admitting: Physical Therapy

## 2022-08-01 ENCOUNTER — Ambulatory Visit: Payer: Medicare HMO | Admitting: Physical Therapy

## 2022-08-01 DIAGNOSIS — R2689 Other abnormalities of gait and mobility: Secondary | ICD-10-CM

## 2022-08-01 DIAGNOSIS — M5459 Other low back pain: Secondary | ICD-10-CM | POA: Diagnosis not present

## 2022-08-01 DIAGNOSIS — M6281 Muscle weakness (generalized): Secondary | ICD-10-CM | POA: Diagnosis not present

## 2022-08-01 NOTE — Therapy (Signed)
OUTPATIENT PHYSICAL THERAPY LOWER EXTREMITY TREATMENT   Patient Name: Lance Dillon MRN: [id number] DOB:May 01, 1940, 82 y.o., male Today's Date: 07/27/2022  END OF SESSION:  PT End of Session - 08/01/22 0801     Visit Number 6    Number of Visits 16    Date for PT Re-Evaluation 09/07/22    Authorization Type [employer]    Progress Note Due on Visit 10    PT Start Time 0803    PT Stop Time 0842    PT Time Calculation (min) 39 min    Activity Tolerance Patient tolerated treatment well    Behavior During Therapy [facility] for tasks assessed/performed               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   Bilateral: Noted on MRA of brain in November 2015 with collateral flow from posterior circulation and external collaterals reconstituting anterior circulation   Chronic kidney disease    Colon polyps 2004, 07, 14   COPD (chronic obstructive pulmonary disease) (HCC)    GERD (gastroesophageal reflux disease)    diet controlled, no meds   Glaucoma    left eye    Hx of radiation therapy 03/22/11 to 05/08/11   PSA recurrent carcinoma of prostate   Hypercholesterolemia    Hypertension    Macular degeneration of both eyes    Peripheral vascular disease (HCC) 07/2011   Bilateral SFA stenosis: Status post right femoropopliteal bypass - Dr. 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   Right-sided 19 mm x 8 mm fusiform aneurysm    Prostate cancer (HCC) 2004   Renal artery stenosis (HCC)    70% left   Stones in the urinary tract    Stroke [facility])    Urinary incontinence    Past Surgical History:  Procedure Laterality Date   ABDOMINAL AORTAGRAM N/A 06/20/2011   Procedure: ABDOMINAL Ronny Flurry;  Surgeon: Nada Libman, MD;  Location: [facility] CATH LAB;  Service:  Cardiovascular;  Laterality: N/A;   ABDOMINAL AORTOGRAM W/LOWER EXTREMITY N/A 12/20/2020   Procedure: ABDOMINAL AORTOGRAM W/LOWER EXTREMITY;  Surgeon: Maeola Harman, MD;  Location: [facility] INVASIVE CV LAB;  Service: Cardiovascular;  Laterality: N/A;   AMPUTATION Right 12/28/2020   Procedure: RIGHT 4TH TOE AMPUTATION;  Surgeon: Maeola Harman, MD;  Location: [facility] OR;  Service: Vascular;  Laterality: Right;   CARDIOVASCULAR STRESS TEST  07/06/2011   Evidence of mild ischemia in Basal Inferior and Mid Inferior regions, no significant wall motion abnormalities noted.   clamp to penis     for urinary control   COLONOSCOPY     COLONOSCOPY W/ BIOPSIES     multiple    CYSTOSCOPY W/ URETERAL STENT PLACEMENT Left 06/07/2015   Procedure: CYSTOSCOPY, RETROGRADE PYLEGRAM  WITH STENT PLACEMENT;  Surgeon: Alfredo Martinez, MD;  Location: [facility] ORS;  Service: Urology;  Laterality: Left;   CYSTOSCOPY WITH RETROGRADE PYELOGRAM, URETEROSCOPY AND STENT PLACEMENT Left 07/28/2015   Procedure: CYSTOSCOPY, URETEROSCOPY, WITH BASKET STONE REMOVAL;  Surgeon: Heloise Purpura, MD;  Location: [facility] ORS;  Service: Urology;  Laterality: Left;   ENDARTERECTOMY FEMORAL Right 12/21/2020   Procedure: RIGHT FEMORAL ENDARTERECTOMY;  Surgeon: Maeola Harman, MD;  Location: [facility] OR;  Service: Vascular;  Laterality: Right;   EYE SURGERY  Right 08/2014   Cataract; removed bilat   FEMORAL-POPLITEAL BYPASS GRAFT  07/19/2011   Procedure: BYPASS GRAFT FEMORAL-POPLITEAL ARTERY;  Surgeon: Pryor Ochoa, MD;  Location: [facility] OR;  Service: Vascular;  Laterality: Right;  Right Femoral - Popliteal  Bypss with saphenous vein   FEMORAL-POPLITEAL BYPASS GRAFT Right 12/21/2020   Procedure: RIGHT FEMORAL-POPLITEAL ARTERY BYPASS GRAFT WITH PTFE;  Surgeon: Maeola Harman, MD;  Location: [facility] OR;  Service: Vascular;  Laterality: Right;   HERNIA REPAIR     right side,lft   INTRAOPERATIVE ARTERIOGRAM  07/19/2011   Procedure:  INTRA OPERATIVE ARTERIOGRAM;  Surgeon: Pryor Ochoa, MD;  Location: [facility] OR;  Service: Vascular;  Laterality: Right;   NM MYOVIEW LTD  07/2011   Normal EF, very small area of basal-mid inferior ischemia; LOW RISK   PENILE PROSTHESIS  REMOVAL     PENILE PROSTHESIS IMPLANT     PR VEIN BYPASS GRAFT,AORTO-FEM-POP  07/19/2011   Right  Fem/Pop BPG   PROSTATE BIOPSY     prostatectomy retropubic radical  07/15/2002   with nerve sparing, Gleason 3+4=7   Reclast  07/25/2013   RENAL DUPLEX  07/01/2012   Left renal artery 60-99% diameter reduction   TRANSTHORACIC ECHOCARDIOGRAM  01/2017   Normal EF and overall function   Patient Active Problem List   Diagnosis Date Noted   COVID-19 virus infection 02/10/2022   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   Bilateral low back pain without sciatica 01/19/2021   Cardiovascular symptoms 01/19/2021   Chronic kidney disease, stage 3a (HCC) 01/19/2021   Encounter for general adult medical examination with abnormal findings 01/19/2021   Gangrene of toe of right foot (HCC) 01/19/2021   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) 01/19/2021   Moderate persistent asthma with (acute) exacerbation 01/19/2021   Nephrosclerosis 01/19/2021   Osteoporosis 01/19/2021   Scoliosis 01/19/2021   Tobacco user 01/19/2021   Urinary incontinence 01/19/2021   Malnutrition of moderate degree 12/29/2020   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) 12/28/2020   Ischemic ulcer of foot, right, with fat layer exposed (HCC) 12/19/2020   COPD (chronic obstructive pulmonary disease) (HCC) 12/19/2020   Lumbar radiculopathy 05/28/2020   Posterior vitreous detachment of left eye 08/13/2019   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 08/13/2019   Retinal layer separation 08/13/2019   Primary open angle glaucoma of both eyes, severe stage 08/13/2019   Rhinitis, chronic 03/12/2018    Degeneration of lumbar intervertebral disc 08/28/2017   Lung field abnormal finding on examination 07/19/2017   COPD GOLD II   07/19/2017   Carotid occlusion, right 08/11/2015   Aneurysm, cerebral 08/11/2015   Hydronephrosis, left 06/06/2015   Kidney disease, chronic, stage II (GFR 60-89 ml/min) 06/06/2015   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   Dizziness 12/16/2013   Vertigo    Left renal artery stenosis - 70% by angiography; increased velocity on recent Dopplers (60-99%) 12/08/2012    Class: Diagnosis of   Hyperlipidemia LDL goal <70 12/08/2012   Personal history of colonic adenomas 11/15/2012   Pain in limb 02/13/2012   Peripheral vascular disease (HCC) 08/07/2011   Atherosclerosis of native artery of extremity with intermittent claudication (HCC) 06/13/2011   Prostate cancer (HCC) 02/16/2011   Primary hypertension 12/09/2010     PCP: Merri Brunette  REFERRING PROVIDER: Clayburn Pert  Denyse Amass  REFERRING DIAG: Low back pain   THERAPY DIAG:  Other low back pain  Muscle weakness (generalized)  Other abnormalities of gait and mobility  Rationale for Evaluation and Treatment: Rehabilitation  ONSET DATE:     SUBJECTIVE:   SUBJECTIVE STATEMENT:  Took off the foot up brace, it was irritating my skin, I will try longer socks next time. Might have a UTI, not feeling well and getting seen by my MD tomorrow.   Eval: Pt states ongoing pain in bil low back, into bil hips. He has had 2 or 3 epidurals. Did feel some relief with this, but not long lasting. He states much pain with standing and walking only a few minutes (about 10 )  causing him to need to sit down. Sitting and laying he has minimal pain. He was seen in PT previously, about 1 year ago. He also states weakness in his legs L >R and has fallen a couple times because of  his leg giving out.  Did have recent injection in hip and SIJ, he is unable to determine if this helped.  He Uses 4 WW for longer distances, so he can sit down.  Uses cane the rest of the time, using cane today.    PERTINENT HISTORY:  HTN, prostate CA, Osteoporosis, COPD, CKD, brain aneurysm, previous stroke?    PAIN:  Are you having pain? Yes: NPRS scale: 8-9/10 Pain location: Bil low back, into hips and legs (as far as mid calf L LE)  Pain description: sore, dull with sharp shooting pains  Aggravating factors: standing Relieving factors: rest, laying down, sitting.   PRECAUTIONS: None  WEIGHT BEARING RESTRICTIONS: No  FALLS:  Has patient fallen in last 6 months? Yes. Number of falls 2 in the last year , L leg giving out, states weakness or pain?    PLOF: Independent  PATIENT GOALS:  Decreased pain   NEXT MD VISIT:   OBJECTIVE:   DIAGNOSTIC FINDINGS:   PATIENT SURVEYS:    COGNITION: Overall cognitive status: Within functional limits for tasks assessed     SENSATION: WFL  EDEMA:    POSTURE:    standing: noted scoliosis, and trunk shift to R.   PALPATION: Tenderness in L >R mid and low lumbar, Tenderness in bil Musculature, and into Bil SIJ.    LOWER EXTREMITY ROM: Lumbar ROM:   flexion: mod limitation,  Ext: significant limitation,  SB: mod/significant limitation -  Hips: WFL,   Knees: WFL  LOWER EXTREMITY MMT:  MMT Left eval Right  eval Left 08/01/22 Right 08/01/22  Hip flexion 4- 4- 3- 3  Hip extension      Hip abduction 4- 4-    Hip adduction      Hip internal rotation      Hip external rotation 4- 4-    Knee flexion 4- 4+ 3+ 4  Knee extension 4 5 4+ 5  Ankle dorsiflexion      Ankle plantarflexion 3- unable to do HR (bil)  3- 3- unable to do HR (bil)  4- 4-  Ankle inversion      Ankle eversion       (Blank rows = not tested)  LOWER EXTREMITY SPECIAL TESTS:    GAIT: Distance walked: 100 ft  Assistive device utilized: Single point cane Level  of assistance: Modified independence Comments: Decreased foot clearance on L, decreased hip and knee flexion on L, slow speed, poor balance,    TODAY'S TREATMENT:  DATE:   08/01/2022 Therapeutic Exercise: Aerobic:   Supine:   Bridges + ABD into red TB x10,PPT x10 with 3 second holds, sidelying clams red TB x15 B,  Seated:  QL stretch 3x30 seconds forward only, seated lumbar rotation stretch 5x10 seconds B, seated marches + TA set x10 B, LAQs red TB x15 B, STS cues for midline wt shift x10/slow eccentric lower, TA set + lateral crunch x10 B Standing:  hip hikes x15 B  Stretches:   Neuromuscular Re-education: Manual Therapy: Therapeutic Activity: Self Care            Gait:        PATIENT EDUCATION:  Education details:  reviewed HEP Person educated: Patient Education method: Explanation, Demonstration, Tactile cues, Verbal cues, and Handouts Education comprehension: verbalized understanding, returned demonstration, verbal cues required, tactile cues required, and needs further education   HOME EXERCISE PROGRAM:  Access Code: [email or web address]    CLINICAL IMPRESSION: 08/01/2022  Mr. Pavelko arrives doing alright today, still having high levels of pain; tried the footup brace but had some skin irritation, will plan to try again with higher socks as a barrier for his skin. Continued work on general LE and core strengthening as tolerated in context of high pain levels. Still awaiting possible lumbar injection. Looks to be making some progress with general LE strength, proximal groups and core still quite weak however. Will continue efforts with progressions as tolerated.    Eval: Patient presents with primary complaint of  pain in bil low back. He has ongoing, significant pain and very low tolerance for standing and walking due to pain. He has much weakness in L LE, and poor  gait mechanics, with decreased clearance of L foot/LE. He will benefit from strengthening as able for L leg, gait training, and education on pain management for low back pain.  Pt with decreased ability for full functional activities. Pt will  benefit from skilled PT to improve deficits and pain and to return to PLOF.   OBJECTIVE IMPAIRMENTS: Abnormal gait, decreased activity tolerance, decreased balance, decreased knowledge of use of DME, decreased mobility, difficulty walking, decreased ROM, decreased strength, improper body mechanics, and pain.   ACTIVITY LIMITATIONS: lifting, bending, standing, squatting, stairs, transfers, dressing, hygiene/grooming, and locomotion level  PARTICIPATION LIMITATIONS: meal prep, shopping, and community activity  PERSONAL FACTORS: 1 comorbidity: possible/previous stroke, L weakness  are also affecting patient's functional outcome.   REHAB POTENTIAL: Fair chronic pain  CLINICAL DECISION MAKING: Stable/uncomplicated  EVALUATION COMPLEXITY: Low   GOALS: Goals reviewed with patient? Yes  SHORT TERM GOALS: Target date: 07/27/2022    Pt to be independent with initial HEP  Goal status: MET 08/01/22    LONG TERM GOALS: Target date: 09/07/2022   Pt to be independent with final HEP  Goal status: INITIAL  2.  Pt to demo improved strength of L LE to at least 4/5 to improve stability and gait.   Goal status: INITIAL  3.  Pt to demo improved ambulation with LRAD, with improved foot and LE clearance on L, for at least 150 ft, and balance WFL,  to improve safety and falls.   Goal status: INITIAL  4. Pt to be independent with self management/pain relief techniques for low back pain.      PLAN:  PT FREQUENCY: 1-2x/week  PT DURATION: 8 weeks  PLANNED INTERVENTIONS: Therapeutic exercises, Therapeutic activity, Neuromuscular re-education, Patient/Family education, Self Care, Joint mobilization, Joint manipulation, Stair training, Orthotic/Fit training,  DME instructions, Aquatic Therapy, Dry Needling,  Electrical stimulation, Cryotherapy, Moist heat, Taping, Ultrasound, Ionotophoresis 4mg /ml Dexamethasone, Manual therapy,  Vasopneumatic device, Traction, Spinal manipulation, Spinal mobilization,Balance training, Gait training,   PLAN FOR NEXT SESSION: LE strength, gait training, low back decompression and mobility, HEP updates PRN   Nedra Hai, PT, DPT 08/01/22 8:43 AM

## 2022-08-02 DIAGNOSIS — C61 Malignant neoplasm of prostate: Secondary | ICD-10-CM | POA: Diagnosis not present

## 2022-08-04 ENCOUNTER — Encounter: Payer: Self-pay | Admitting: Physical Therapy

## 2022-08-04 ENCOUNTER — Ambulatory Visit: Payer: Medicare HMO | Admitting: Physical Therapy

## 2022-08-04 DIAGNOSIS — R2689 Other abnormalities of gait and mobility: Secondary | ICD-10-CM | POA: Diagnosis not present

## 2022-08-04 DIAGNOSIS — M6281 Muscle weakness (generalized): Secondary | ICD-10-CM | POA: Diagnosis not present

## 2022-08-04 DIAGNOSIS — M5459 Other low back pain: Secondary | ICD-10-CM | POA: Diagnosis not present

## 2022-08-04 NOTE — Therapy (Signed)
OUTPATIENT PHYSICAL THERAPY LOWER EXTREMITY TREATMENT   Patient Name: Lance Dillon MRN: [id number] DOB:1940/12/19, 82 y.o., male Today's Date: 07/27/2022  END OF SESSION:  PT End of Session - 08/04/22 0811     Visit Number 7    Number of Visits 16    Date for PT Re-Evaluation 09/07/22    Authorization Type [employer]    Progress Note Due on Visit 10    PT Start Time 0800    PT Stop Time 0842    PT Time Calculation (min) 42 min    Activity Tolerance Patient tolerated treatment well    Behavior During Therapy [facility] for tasks assessed/performed                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   Bilateral: Noted on MRA of brain in November 2015 with collateral flow from posterior circulation and external collaterals reconstituting anterior circulation   Chronic kidney disease    Colon polyps 2004, 07, 14   COPD (chronic obstructive pulmonary disease) (HCC)    GERD (gastroesophageal reflux disease)    diet controlled, no meds   Glaucoma    left eye    Hx of radiation therapy 03/22/11 to 05/08/11   PSA recurrent carcinoma of prostate   Hypercholesterolemia    Hypertension    Macular degeneration of both eyes    Peripheral vascular disease (HCC) 07/2011   Bilateral SFA stenosis: Status post right femoropopliteal bypass - Dr. 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   Right-sided 19 mm x 8 mm fusiform aneurysm    Prostate cancer (HCC) 2004   Renal artery stenosis (HCC)    70% left   Stones in the urinary tract    Stroke [facility])    Urinary incontinence    Past Surgical History:  Procedure Laterality Date   ABDOMINAL AORTAGRAM N/A 06/20/2011   Procedure: ABDOMINAL Ronny Flurry;  Surgeon: Nada Libman, MD;  Location: [facility] CATH LAB;  Service:  Cardiovascular;  Laterality: N/A;   ABDOMINAL AORTOGRAM W/LOWER EXTREMITY N/A 12/20/2020   Procedure: ABDOMINAL AORTOGRAM W/LOWER EXTREMITY;  Surgeon: Maeola Harman, MD;  Location: [facility] INVASIVE CV LAB;  Service: Cardiovascular;  Laterality: N/A;   AMPUTATION Right 12/28/2020   Procedure: RIGHT 4TH TOE AMPUTATION;  Surgeon: Maeola Harman, MD;  Location: [facility] OR;  Service: Vascular;  Laterality: Right;   CARDIOVASCULAR STRESS TEST  07/06/2011   Evidence of mild ischemia in Basal Inferior and Mid Inferior regions, no significant wall motion abnormalities noted.   clamp to penis     for urinary control   COLONOSCOPY     COLONOSCOPY W/ BIOPSIES     multiple    CYSTOSCOPY W/ URETERAL STENT PLACEMENT Left 06/07/2015   Procedure: CYSTOSCOPY, RETROGRADE PYLEGRAM  WITH STENT PLACEMENT;  Surgeon: Alfredo Martinez, MD;  Location: [facility] ORS;  Service: Urology;  Laterality: Left;   CYSTOSCOPY WITH RETROGRADE PYELOGRAM, URETEROSCOPY AND STENT PLACEMENT Left 07/28/2015   Procedure: CYSTOSCOPY, URETEROSCOPY, WITH BASKET STONE REMOVAL;  Surgeon: Heloise Purpura, MD;  Location: [facility] ORS;  Service: Urology;  Laterality: Left;   ENDARTERECTOMY FEMORAL Right 12/21/2020   Procedure: RIGHT FEMORAL ENDARTERECTOMY;  Surgeon: Maeola Harman, MD;  Location: [facility] OR;  Service: Vascular;  Laterality: Right;   EYE  SURGERY Right 08/2014   Cataract; removed bilat   FEMORAL-POPLITEAL BYPASS GRAFT  07/19/2011   Procedure: BYPASS GRAFT FEMORAL-POPLITEAL ARTERY;  Surgeon: Pryor Ochoa, MD;  Location: [facility] OR;  Service: Vascular;  Laterality: Right;  Right Femoral - Popliteal  Bypss with saphenous vein   FEMORAL-POPLITEAL BYPASS GRAFT Right 12/21/2020   Procedure: RIGHT FEMORAL-POPLITEAL ARTERY BYPASS GRAFT WITH PTFE;  Surgeon: Maeola Harman, MD;  Location[facility] OR;  Service: Vascular;  Laterality: Right;   HERNIA REPAIR     right side,lft   INTRAOPERATIVE ARTERIOGRAM  07/19/2011   Procedure:  INTRA OPERATIVE ARTERIOGRAM;  Surgeon: Pryor Ochoa, MD;  Location: [facility] OR;  Service: Vascular;  Laterality: Right;   NM MYOVIEW LTD  07/2011   Normal EF, very small area of basal-mid inferior ischemia; LOW RISK   PENILE PROSTHESIS  REMOVAL     PENILE PROSTHESIS IMPLANT     PR VEIN BYPASS GRAFT,AORTO-FEM-POP  07/19/2011   Right  Fem/Pop BPG   PROSTATE BIOPSY     prostatectomy retropubic radical  07/15/2002   with nerve sparing, Gleason 3+4=7   Reclast  07/25/2013   RENAL DUPLEX  07/01/2012   Left renal artery 60-99% diameter reduction   TRANSTHORACIC ECHOCARDIOGRAM  01/2017   Normal EF and overall function   Patient Active Problem List   Diagnosis Date Noted   COVID-19 virus infection 02/10/2022   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   Bilateral low back pain without sciatica 01/19/2021   Cardiovascular symptoms 01/19/2021   Chronic kidney disease, stage 3a (HCC) 01/19/2021   Encounter for general adult medical examination with abnormal findings 01/19/2021   Gangrene of toe of right foot (HCC) 01/19/2021   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) 01/19/2021   Moderate persistent asthma with (acute) exacerbation 01/19/2021   Nephrosclerosis 01/19/2021   Osteoporosis 01/19/2021   Scoliosis 01/19/2021   Tobacco user 01/19/2021   Urinary incontinence 01/19/2021   Malnutrition of moderate degree 12/29/2020   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) 12/28/2020   Ischemic ulcer of foot, right, with fat layer exposed (HCC) 12/19/2020   COPD (chronic obstructive pulmonary disease) (HCC) 12/19/2020   Lumbar radiculopathy 05/28/2020   Posterior vitreous detachment of left eye 08/13/2019   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 08/13/2019   Retinal layer separation 08/13/2019   Primary open angle glaucoma of both eyes, severe stage 08/13/2019   Rhinitis, chronic 03/12/2018    Degeneration of lumbar intervertebral disc 08/28/2017   Lung field abnormal finding on examination 07/19/2017   COPD GOLD II   07/19/2017   Carotid occlusion, right 08/11/2015   Aneurysm, cerebral 08/11/2015   Hydronephrosis, left 06/06/2015   Kidney disease, chronic, stage II (GFR 60-89 ml/min) 06/06/2015   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   Dizziness 12/16/2013   Vertigo    Left renal artery stenosis - 70% by angiography; increased velocity on recent Dopplers (60-99%) 12/08/2012    Class: Diagnosis of   Hyperlipidemia LDL goal <70 12/08/2012   Personal history of colonic adenomas 11/15/2012   Pain in limb 02/13/2012   Peripheral vascular disease (HCC) 08/07/2011   Atherosclerosis of native artery of extremity with intermittent claudication (HCC) 06/13/2011   Prostate cancer (HCC) 02/16/2011   Primary hypertension 12/09/2010     PCP: Merri Brunette  REFERRING PROVIDER:  [name]  REFERRING DIAG: Low back pain   THERAPY DIAG:  Other low back pain  Muscle weakness (generalized)  Other abnormalities of gait and mobility  Rationale for Evaluation and Treatment: Rehabilitation  ONSET DATE:     SUBJECTIVE:   SUBJECTIVE STATEMENT:  I'm doing well today, having a little harder time walking today but those days come and go. I felt good after last PT session, I was sore but I put ice on my back and was OK after. Last time was between medium and hard in difficulty but it was good.   Eval: Pt states ongoing pain in bil low back, into bil hips. He has had 2 or 3 epidurals. Did feel some relief with this, but not long lasting. He states much pain with standing and walking only a few minutes (about 10 )  causing him to need to sit down. Sitting and laying he has minimal pain. He was seen in PT previously, about 1  year ago. He also states weakness in his legs L >R and has fallen a couple times because of his leg giving out.  Did have recent injection in hip and SIJ, he is unable to determine if this helped.  He Uses 4 WW for longer distances, so he can sit down.  Uses cane the rest of the time, using cane today.    PERTINENT HISTORY:  HTN, prostate CA, Osteoporosis, COPD, CKD, brain aneurysm, previous stroke?    PAIN:  Are you having pain? Yes: NPRS scale: 7-8/10 Pain location: Bil low back, into hips and legs (as far as mid calf L LE)  Pain description: sore, dull with sharp shooting pains  Aggravating factors: standing Relieving factors: rest, laying down, sitting.   PRECAUTIONS: None  WEIGHT BEARING RESTRICTIONS: No  FALLS:  Has patient fallen in last 6 months? Yes. Number of falls 2 in the last year , L leg giving out, states weakness or pain?    PLOF: Independent  PATIENT GOALS:  Decreased pain   NEXT MD VISIT:   OBJECTIVE:   DIAGNOSTIC FINDINGS:   PATIENT SURVEYS:    COGNITION: Overall cognitive status: Within functional limits for tasks assessed     SENSATION: WFL  EDEMA:    POSTURE:    standing: noted scoliosis, and trunk shift to R.   PALPATION: Tenderness in L >R mid and low lumbar, Tenderness in bil Musculature, and into Bil SIJ.    LOWER EXTREMITY ROM: Lumbar ROM:   flexion: mod limitation,  Ext: significant limitation,  SB: mod/significant limitation -  Hips: WFL,   Knees: WFL  LOWER EXTREMITY MMT:  MMT Left eval Right  eval Left 08/01/22 Right 08/01/22  Hip flexion 4- 4- 3- 3  Hip extension      Hip abduction 4- 4-    Hip adduction      Hip internal rotation      Hip external rotation 4- 4-    Knee flexion 4- 4+ 3+ 4  Knee extension 4 5 4+ 5  Ankle dorsiflexion      Ankle plantarflexion 3- unable to do HR (bil)  3- 3- unable to do HR (bil)  4- 4-  Ankle inversion      Ankle eversion       (Blank rows = not tested)  LOWER EXTREMITY SPECIAL  TESTS:    GAIT: Distance walked: 100 ft  Assistive device utilized: Single point cane Level of assistance: Modified independence Comments: Decreased foot clearance on L, decreased hip  and knee flexion on L, slow speed, poor balance,    TODAY'S TREATMENT:                                                                                                                              DATE:   08/04/2022 Therapeutic Exercise: Aerobic:   Supine:   PPT x15 with 3 second hold, bridge + abd into red TB x12              Seated:  QL stretch 3x30 seconds forward, TA set + march x10 B, lumbar rotation stretch 5x5 seconds B, LAQs x15 B red TB, seated clams red TB x10 + TA set    Standing:  STS with red TB above knees x10 cue for wt midline 2x5   Stretches:  figure 4 stretch 2x30 seconds B,  Neuromuscular Re-education: Manual Therapy: Therapeutic Activity: Self Care            Gait:        PATIENT EDUCATION:  Education details:  reviewed HEP Person educated: Patient Education method: Explanation, Demonstration, Tactile cues, Verbal cues, and Handouts Education comprehension: verbalized understanding, returned demonstration, verbal cues required, tactile cues required, and needs further education   HOME EXERCISE PROGRAM:  Access Code: [email or web address] URL: [email or web address] Date: 08/04/2022 Prepared by: Nedra Hai  Exercises - Supine Lower Trunk Rotation  - 1- 2 x daily - 10 reps - 5 hold - Supine Posterior Pelvic Tilt  - 2 x daily - 1 sets - 10 reps - Straight Leg Raise  - 1 x daily - 1 sets - 10 reps - Sidelying Hip Abduction  - 1 x daily - 1 sets - 10 reps - Clam  - 1 x daily - 1 sets - 10 reps - Sit to Stand Without Arm Support  - 1 x daily - 1 sets - 5 reps - Seated Knee Extension AROM  - 1 x daily - 2 sets - 10 reps - Supine Bridge with Resistance Band  - 1 x daily - 7 x weekly - 1 sets - 10 reps - 1 hold - Supine Figure 4 Piriformis Stretch  - 1 x daily - 7 x  weekly - 1 sets - 3 reps - 3 hold - Seated Quadratus Lumborum Stretch in Chair  - 1 x daily - 7 x weekly - 1 sets - 3 reps - 30 hold - Standing Hip Hiking  - 1 x daily - 7 x weekly - 1 sets - 10 reps - 1 hold    CLINICAL IMPRESSION: 08/04/2022  Mr. Armond arrives today doing well, sounds like last session was a good challenge for him. We worked on the same exercises with progression of reps for improved muscle strength and endurance, also for exercise review as I did add some of these to his HEP. Will continue to progress as able.      Eval: Patient presents with primary complaint of  pain in  bil low back. He has ongoing, significant pain and very low tolerance for standing and walking due to pain. He has much weakness in L LE, and poor gait mechanics, with decreased clearance of L foot/LE. He will benefit from strengthening as able for L leg, gait training, and education on pain management for low back pain.  Pt with decreased ability for full functional activities. Pt will  benefit from skilled PT to improve deficits and pain and to return to PLOF.   OBJECTIVE IMPAIRMENTS: Abnormal gait, decreased activity tolerance, decreased balance, decreased knowledge of use of DME, decreased mobility, difficulty walking, decreased ROM, decreased strength, improper body mechanics, and pain.   ACTIVITY LIMITATIONS: lifting, bending, standing, squatting, stairs, transfers, dressing, hygiene/grooming, and locomotion level  PARTICIPATION LIMITATIONS: meal prep, shopping, and community activity  PERSONAL FACTORS: 1 comorbidity: possible/previous stroke, L weakness  are also affecting patient's functional outcome.   REHAB POTENTIAL: Fair chronic pain  CLINICAL DECISION MAKING: Stable/uncomplicated  EVALUATION COMPLEXITY: Low   GOALS: Goals reviewed with patient? Yes  SHORT TERM GOALS: Target date: 07/27/2022    Pt to be independent with initial HEP  Goal status: MET 08/01/22    LONG TERM GOALS:  Target date: 09/07/2022   Pt to be independent with final HEP  Goal status: INITIAL  2.  Pt to demo improved strength of L LE to at least 4/5 to improve stability and gait.   Goal status: INITIAL  3.  Pt to demo improved ambulation with LRAD, with improved foot and LE clearance on L, for at least 150 ft, and balance WFL,  to improve safety and falls.   Goal status: INITIAL  4. Pt to be independent with self management/pain relief techniques for low back pain.      PLAN:  PT FREQUENCY: 1-2x/week  PT DURATION: 8 weeks  PLANNED INTERVENTIONS: Therapeutic exercises, Therapeutic activity, Neuromuscular re-education, Patient/Family education, Self Care, Joint mobilization, Joint manipulation, Stair training, Orthotic/Fit training, DME instructions, Aquatic Therapy, Dry Needling, Electrical stimulation, Cryotherapy, Moist heat, Taping, Ultrasound, Ionotophoresis 4mg /ml Dexamethasone, Manual therapy,  Vasopneumatic device, Traction, Spinal manipulation, Spinal mobilization,Balance training, Gait training,   PLAN FOR NEXT SESSION: LE strength, gait training, low back decompression and mobility, how do HEP additions feel?   Nedra Hai, PT, DPT 08/04/22 8:42 AM

## 2022-08-07 ENCOUNTER — Encounter: Payer: Self-pay | Admitting: Physical Therapy

## 2022-08-07 ENCOUNTER — Ambulatory Visit: Payer: Medicare HMO | Admitting: Physical Therapy

## 2022-08-07 DIAGNOSIS — M5459 Other low back pain: Secondary | ICD-10-CM | POA: Diagnosis not present

## 2022-08-07 DIAGNOSIS — R2689 Other abnormalities of gait and mobility: Secondary | ICD-10-CM | POA: Diagnosis not present

## 2022-08-07 DIAGNOSIS — M6281 Muscle weakness (generalized): Secondary | ICD-10-CM

## 2022-08-07 NOTE — Therapy (Signed)
OUTPATIENT PHYSICAL THERAPY LOWER EXTREMITY TREATMENT   Patient Name: Lance Dillon MRN: [id number] DOB:07-18-1940, 82 y.o., male Today's Date: 08/07/2022  END OF SESSION:  PT End of Session - 08/07/22 1516     Visit Number 8    Number of Visits 16    Date for PT Re-Evaluation 09/07/22    Authorization Type [employer]    Progress Note Due on Visit 10    PT Start Time 1518    PT Stop Time 1600    PT Time Calculation (min) 42 min    Activity Tolerance Patient tolerated treatment well    Behavior During Therapy [facility] for tasks assessed/performed                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   Bilateral: Noted on MRA of brain in November 2015 with collateral flow from posterior circulation and external collaterals reconstituting anterior circulation   Chronic kidney disease    Colon polyps 2004, 07, 14   COPD (chronic obstructive pulmonary disease) (HCC)    GERD (gastroesophageal reflux disease)    diet controlled, no meds   Glaucoma    left eye    Hx of radiation therapy 03/22/11 to 05/08/11   PSA recurrent carcinoma of prostate   Hypercholesterolemia    Hypertension    Macular degeneration of both eyes    Peripheral vascular disease (HCC) 07/2011   Bilateral SFA stenosis: Status post right femoropopliteal bypass - Dr. 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   Right-sided 19 mm x 8 mm fusiform aneurysm    Prostate cancer (HCC) 2004   Renal artery stenosis (HCC)    70% left   Stones in the urinary tract    Stroke [facility])    Urinary incontinence    Past Surgical History:  Procedure Laterality Date   ABDOMINAL AORTAGRAM N/A 06/20/2011   Procedure: ABDOMINAL Ronny Flurry;  Surgeon: Nada Libman, MD;  Location: [facility] CATH LAB;  Service:  Cardiovascular;  Laterality: N/A;   ABDOMINAL AORTOGRAM W/LOWER EXTREMITY N/A 12/20/2020   Procedure: ABDOMINAL AORTOGRAM W/LOWER EXTREMITY;  Surgeon: Maeola Harman, MD;  Location: [facility] INVASIVE CV LAB;  Service: Cardiovascular;  Laterality: N/A;   AMPUTATION Right 12/28/2020   Procedure: RIGHT 4TH TOE AMPUTATION;  Surgeon: Maeola Harman, MD;  Location: [facility] OR;  Service: Vascular;  Laterality: Right;   CARDIOVASCULAR STRESS TEST  07/06/2011   Evidence of mild ischemia in Basal Inferior and Mid Inferior regions, no significant wall motion abnormalities noted.   clamp to penis     for urinary control   COLONOSCOPY     COLONOSCOPY W/ BIOPSIES     multiple    CYSTOSCOPY W/ URETERAL STENT PLACEMENT Left 06/07/2015   Procedure: CYSTOSCOPY, RETROGRADE PYLEGRAM  WITH STENT PLACEMENT;  Surgeon: Alfredo Martinez, MD;  Location: [facility] ORS;  Service: Urology;  Laterality: Left;   CYSTOSCOPY WITH RETROGRADE PYELOGRAM, URETEROSCOPY AND STENT PLACEMENT Left 07/28/2015   Procedure: CYSTOSCOPY, URETEROSCOPY, WITH BASKET STONE REMOVAL;  Surgeon: Heloise Purpura, MD;  Location: [facility] ORS;  Service: Urology;  Laterality: Left;   ENDARTERECTOMY FEMORAL Right 12/21/2020   Procedure: RIGHT FEMORAL ENDARTERECTOMY;  Surgeon: Maeola Harman, MD;  Location: [facility] OR;  Service: Vascular;  Laterality: Right;   EYE  SURGERY Right 08/2014   Cataract; removed bilat   FEMORAL-POPLITEAL BYPASS GRAFT  07/19/2011   Procedure: BYPASS GRAFT FEMORAL-POPLITEAL ARTERY;  Surgeon: Pryor Ochoa, MD;  Location: [facility] OR;  Service: Vascular;  Laterality: Right;  Right Femoral - Popliteal  Bypss with saphenous vein   FEMORAL-POPLITEAL BYPASS GRAFT Right 12/21/2020   Procedure: RIGHT FEMORAL-POPLITEAL ARTERY BYPASS GRAFT WITH PTFE;  Surgeon: Maeola Harman, MD;  Location: [facility] OR;  Service: Vascular;  Laterality: Right;   HERNIA REPAIR     right side,lft   INTRAOPERATIVE ARTERIOGRAM  07/19/2011   Procedure:  INTRA OPERATIVE ARTERIOGRAM;  Surgeon: Pryor Ochoa, MD;  Location: [facility] OR;  Service: Vascular;  Laterality: Right;   NM MYOVIEW LTD  07/2011   Normal EF, very small area of basal-mid inferior ischemia; LOW RISK   PENILE PROSTHESIS  REMOVAL     PENILE PROSTHESIS IMPLANT     PR VEIN BYPASS GRAFT,AORTO-FEM-POP  07/19/2011   Right  Fem/Pop BPG   PROSTATE BIOPSY     prostatectomy retropubic radical  07/15/2002   with nerve sparing, Gleason 3+4=7   Reclast  07/25/2013   RENAL DUPLEX  07/01/2012   Left renal artery 60-99% diameter reduction   TRANSTHORACIC ECHOCARDIOGRAM  01/2017   Normal EF and overall function   Patient Active Problem List   Diagnosis Date Noted   COVID-19 virus infection 02/10/2022   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   Bilateral low back pain without sciatica 01/19/2021   Cardiovascular symptoms 01/19/2021   Chronic kidney disease, stage 3a (HCC) 01/19/2021   Encounter for general adult medical examination with abnormal findings 01/19/2021   Gangrene of toe of right foot (HCC) 01/19/2021   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) 01/19/2021   Moderate persistent asthma with (acute) exacerbation 01/19/2021   Nephrosclerosis 01/19/2021   Osteoporosis 01/19/2021   Scoliosis 01/19/2021   Tobacco user 01/19/2021   Urinary incontinence 01/19/2021   Malnutrition of moderate degree 12/29/2020   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) 12/28/2020   Ischemic ulcer of foot, right, with fat layer exposed (HCC) 12/19/2020   COPD (chronic obstructive pulmonary disease) (HCC) 12/19/2020   Lumbar radiculopathy 05/28/2020   Posterior vitreous detachment of left eye 08/13/2019   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 08/13/2019   Retinal layer separation 08/13/2019   Primary open angle glaucoma of both eyes, severe stage 08/13/2019   Rhinitis, chronic 03/12/2018    Degeneration of lumbar intervertebral disc 08/28/2017   Lung field abnormal finding on examination 07/19/2017   COPD GOLD II   07/19/2017   Carotid occlusion, right 08/11/2015   Aneurysm, cerebral 08/11/2015   Hydronephrosis, left 06/06/2015   Kidney disease, chronic, stage II (GFR 60-89 ml/min) 06/06/2015   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   Dizziness 12/16/2013   Vertigo    Left renal artery stenosis - 70% by angiography; increased velocity on recent Dopplers (60-99%) 12/08/2012    Class: Diagnosis of   Hyperlipidemia LDL goal <70 12/08/2012   Personal history of colonic adenomas 11/15/2012   Pain in limb 02/13/2012   Peripheral vascular disease (HCC) 08/07/2011   Atherosclerosis of native artery of extremity with intermittent claudication (HCC) 06/13/2011   Prostate cancer (HCC) 02/16/2011   Primary hypertension 12/09/2010     PCP: Merri Brunette  REFERRING PROVIDER:  [name]  REFERRING DIAG: Low back pain   THERAPY DIAG:  Other low back pain  Muscle weakness (generalized)  Other abnormalities of gait and mobility  Rationale for Evaluation and Treatment: Rehabilitation  ONSET DATE:     SUBJECTIVE:   SUBJECTIVE STATEMENT: Pt states continued/significant pain in back anytime his up/walking. Tried foot-up orthosis, but he does not find helpful.    Eval: Pt states ongoing pain in bil low back, into bil hips. He has had 2 or 3 epidurals. Did feel some relief with this, but not long lasting. He states much pain with standing and walking only a few minutes (about 10 )  causing him to need to sit down. Sitting and laying he has minimal pain. He was seen in PT previously, about 1 year ago. He also states weakness in his legs L >R and has fallen a couple times because of his leg giving out.  Did have recent  injection in hip and SIJ, he is unable to determine if this helped.  He Uses 4 WW for longer distances, so he can sit down.  Uses cane the rest of the time, using cane today.    PERTINENT HISTORY:  HTN, prostate CA, Osteoporosis, COPD, CKD, brain aneurysm, previous stroke?    PAIN:  Are you having pain? Yes: NPRS scale: 7-8/10 Pain location: Bil low back, into hips and legs (as far as mid calf L LE)  Pain description: sore, dull with sharp shooting pains  Aggravating factors: standing Relieving factors: rest, laying down, sitting.   PRECAUTIONS: None  WEIGHT BEARING RESTRICTIONS: No  FALLS:  Has patient fallen in last 6 months? Yes. Number of falls 2 in the last year , L leg giving out, states weakness or pain    PLOF: Independent  PATIENT GOALS:  Decreased pain   NEXT MD VISIT:   OBJECTIVE:   DIAGNOSTIC FINDINGS:   PATIENT SURVEYS:    COGNITION: Overall cognitive status: Within functional limits for tasks assessed     SENSATION: WFL  EDEMA:    POSTURE:    standing: noted scoliosis, and trunk shift to R.   PALPATION: Tenderness in L >R mid and low lumbar, Tenderness in bil Musculature, and into Bil SIJ.    LOWER EXTREMITY ROM: Lumbar ROM:   flexion: mod limitation,  Ext: significant limitation,  SB: mod/significant limitation -  Hips: WFL,   Knees: WFL  LOWER EXTREMITY MMT:  MMT Left eval Right  eval Left 08/01/22 Right 08/01/22  Hip flexion 4- 4- 3- 3  Hip extension      Hip abduction 4- 4-    Hip adduction      Hip internal rotation      Hip external rotation 4- 4-    Knee flexion 4- 4+ 3+ 4  Knee extension 4 5 4+ 5  Ankle dorsiflexion      Ankle plantarflexion 3- unable to do HR (bil)  3- 3- unable to do HR (bil)  4- 4-  Ankle inversion      Ankle eversion       (Blank rows = not tested)  LOWER EXTREMITY SPECIAL TESTS:    GAIT: Distance walked: 100 ft  Assistive device utilized: Single point cane Level of assistance: Modified  independence Comments: Decreased foot clearance on L, decreased hip and knee flexion on L, slow speed, poor balance,    TODAY'S TREATMENT:  DATE:   08/07/2022 Therapeutic Exercise: Aerobic:   Supine:  Supine march with GTB x 15;  Clams GTB x 20 with TA;  SLR x 5 on L:   PPT x15 with 3 second hold,  bridge 2 x 10 ;             Seated:  QL stretch 3x30 seconds , LAQs 2lb 2 x10 B ,  HSC GTB x 15 on L;   S/L hip abd 2 x 10 on L;  Standing:  STS x 10; marching x 20 at counter,  gait SPC 45 ft x 2, cuing for increasing hip/knee flexion for clearance. Stretches:   Neuromuscular Re-education: Manual Therapy: long leg distraction on L for lumbar;  Therapeutic Activity: Self Care            Gait:     PATIENT EDUCATION:  Education details:  reviewed HEP Person educated: Patient Education method: Explanation, Demonstration, Tactile cues, Verbal cues, and Handouts Education comprehension: verbalized understanding, returned demonstration, verbal cues required, tactile cues required, and needs further education   HOME EXERCISE PROGRAM:  Access Code: [email or web address]   CLINICAL IMPRESSION: 08/07/2022 Pt able to perform ther ex, but still very challenged due to much weakness in LE. He has been doing HEP at home more consistently. He continues to have significant pain in his back when standing/walking. We discussed use of RW again. I do think it will be helpful for longer distances, so that he has a place to sit down when needed, as well as providing more support for back and LE. He has very poor foot clearance and labored walking with SPC, and is a fall risk.    Eval: Patient presents with primary complaint of  pain in bil low back. He has ongoing, significant pain and very low tolerance for standing and walking due to pain. He has much weakness in L LE, and poor gait mechanics, with  decreased clearance of L foot/LE. He will benefit from strengthening as able for L leg, gait training, and education on pain management for low back pain.  Pt with decreased ability for full functional activities. Pt will  benefit from skilled PT to improve deficits and pain and to return to PLOF.   OBJECTIVE IMPAIRMENTS: Abnormal gait, decreased activity tolerance, decreased balance, decreased knowledge of use of DME, decreased mobility, difficulty walking, decreased ROM, decreased strength, improper body mechanics, and pain.   ACTIVITY LIMITATIONS: lifting, bending, standing, squatting, stairs, transfers, dressing, hygiene/grooming, and locomotion level  PARTICIPATION LIMITATIONS: meal prep, shopping, and community activity  PERSONAL FACTORS: 1 comorbidity: possible/previous stroke, L weakness  are also affecting patient's functional outcome.   REHAB POTENTIAL: Fair chronic pain  CLINICAL DECISION MAKING: Stable/uncomplicated  EVALUATION COMPLEXITY: Low   GOALS: Goals reviewed with patient? Yes  SHORT TERM GOALS: Target date: 07/27/2022    Pt to be independent with initial HEP  Goal status: MET 08/01/22    LONG TERM GOALS: Target date: 09/07/2022   Pt to be independent with final HEP  Goal status: INITIAL  2.  Pt to demo improved strength of L LE to at least 4/5 to improve stability and gait.   Goal status: INITIAL  3.  Pt to demo improved ambulation with LRAD, with improved foot and LE clearance on L, for at least 150 ft, and balance WFL,  to improve safety and falls.   Goal status: INITIAL  4. Pt to be independent with self management/pain relief techniques for low back pain.  PLAN:  PT FREQUENCY: 1-2x/week  PT DURATION: 8 weeks  PLANNED INTERVENTIONS: Therapeutic exercises, Therapeutic activity, Neuromuscular re-education, Patient/Family education, Self Care, Joint mobilization, Joint manipulation, Stair training, Orthotic/Fit training, DME instructions,  Aquatic Therapy, Dry Needling, Electrical stimulation, Cryotherapy, Moist heat, Taping, Ultrasound, Ionotophoresis 4mg /ml Dexamethasone, Manual therapy,  Vasopneumatic device, Traction, Spinal manipulation, Spinal mobilization,Balance training, Gait training,   PLAN FOR NEXT SESSION: LE strength, gait training, low back decompression and mobility, how do HEP additions feel?     Sedalia Muta, PT, DPT 3:17 PM  08/07/22

## 2022-08-09 DIAGNOSIS — C61 Malignant neoplasm of prostate: Secondary | ICD-10-CM | POA: Diagnosis not present

## 2022-08-09 DIAGNOSIS — N393 Stress incontinence (female) (male): Secondary | ICD-10-CM | POA: Diagnosis not present

## 2022-08-09 DIAGNOSIS — R310 Gross hematuria: Secondary | ICD-10-CM | POA: Diagnosis not present

## 2022-08-10 ENCOUNTER — Encounter: Payer: Self-pay | Admitting: Physical Therapy

## 2022-08-10 ENCOUNTER — Ambulatory Visit: Payer: Medicare HMO | Admitting: Physical Therapy

## 2022-08-10 DIAGNOSIS — R2689 Other abnormalities of gait and mobility: Secondary | ICD-10-CM | POA: Diagnosis not present

## 2022-08-10 DIAGNOSIS — M5459 Other low back pain: Secondary | ICD-10-CM | POA: Diagnosis not present

## 2022-08-10 DIAGNOSIS — M6281 Muscle weakness (generalized): Secondary | ICD-10-CM | POA: Diagnosis not present

## 2022-08-10 NOTE — Therapy (Addendum)
OUTPATIENT PHYSICAL THERAPY LOWER EXTREMITY TREATMENT   Patient Name: Lance Dillon MRN: [id number] DOB:April 09, 1940, 82 y.o., male Today's Date: 08/10/2022  END OF SESSION:  PT End of Session - 08/10/22 1614     Visit Number 9    Number of Visits 16    Date for PT Re-Evaluation 09/07/22    Authorization Type [employer]    Progress Note Due on Visit 10    PT Start Time 1558    PT Stop Time 1634    PT Time Calculation (min) 36 min    Activity Tolerance Patient tolerated treatment well    Behavior During Therapy WFL for tasks assessed/performed                 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   Bilateral: Noted on MRA of brain in November 2015 with collateral flow from posterior circulation and external collaterals reconstituting anterior circulation   Chronic kidney disease    Colon polyps 2004, 07, 14   COPD (chronic obstructive pulmonary disease) (HCC)    GERD (gastroesophageal reflux disease)    diet controlled, no meds   Glaucoma    left eye    Hx of radiation therapy 03/22/11 to 05/08/11   PSA recurrent carcinoma of prostate   Hypercholesterolemia    Hypertension    Macular degeneration of both eyes    Peripheral vascular disease (HCC) 07/2011   Bilateral SFA stenosis: Status post right femoropopliteal bypass - Dr. 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   Right-sided 19 mm x 8 mm fusiform aneurysm    Prostate cancer (HCC) 2004   Renal artery stenosis (HCC)    70% left   Stones in the urinary tract    Stroke [facility])    Urinary incontinence    Past Surgical History:  Procedure Laterality Date   ABDOMINAL AORTAGRAM N/A 06/20/2011   Procedure: ABDOMINAL Ronny Flurry;  Surgeon: Nada Libman, MD;  Location: [facility] CATH LAB;  Service:  Cardiovascular;  Laterality: N/A;   ABDOMINAL AORTOGRAM W/LOWER EXTREMITY N/A 12/20/2020   Procedure: ABDOMINAL AORTOGRAM W/LOWER EXTREMITY;  Surgeon: Maeola Harman, MD;  Location: [facility] INVASIVE CV LAB;  Service: Cardiovascular;  Laterality: N/A;   AMPUTATION Right 12/28/2020   Procedure: RIGHT 4TH TOE AMPUTATION;  Surgeon: Maeola Harman, MD;  Location: [facility] OR;  Service: Vascular;  Laterality: Right;   CARDIOVASCULAR STRESS TEST  07/06/2011   Evidence of mild ischemia in Basal Inferior and Mid Inferior regions, no significant wall motion abnormalities noted.   clamp to penis     for urinary control   COLONOSCOPY     COLONOSCOPY W/ BIOPSIES     multiple    CYSTOSCOPY W/ URETERAL STENT PLACEMENT Left 06/07/2015   Procedure: CYSTOSCOPY, RETROGRADE PYLEGRAM  WITH STENT PLACEMENT;  Surgeon: Alfredo Martinez, MD;  Location: [facility] ORS;  Service: Urology;  Laterality: Left;   CYSTOSCOPY WITH RETROGRADE PYELOGRAM, URETEROSCOPY AND STENT PLACEMENT Left 07/28/2015   Procedure: CYSTOSCOPY, URETEROSCOPY, WITH BASKET STONE REMOVAL;  Surgeon: Heloise Purpura, MD;  Location: [facility] ORS;  Service: Urology;  Laterality: Left;   ENDARTERECTOMY FEMORAL Right 12/21/2020   Procedure: RIGHT FEMORAL ENDARTERECTOMY;  Surgeon: Maeola Harman, MD;  Location: [facility] OR;  Service: Vascular;  Laterality: Right;  EYE SURGERY Right 08/2014   Cataract; removed bilat   FEMORAL-POPLITEAL BYPASS GRAFT  07/19/2011   Procedure: BYPASS GRAFT FEMORAL-POPLITEAL ARTERY;  Surgeon: Pryor Ochoa, MD;  Location: [facility] OR;  Service: Vascular;  Laterality: Right;  Right Femoral - Popliteal  Bypss with saphenous vein   FEMORAL-POPLITEAL BYPASS GRAFT Right 12/21/2020   Procedure: RIGHT FEMORAL-POPLITEAL ARTERY BYPASS GRAFT WITH PTFE;  Surgeon: Maeola Harman, MD;  Location: [facility] OR;  Service: Vascular;  Laterality: Right;   HERNIA REPAIR     right side,lft   INTRAOPERATIVE ARTERIOGRAM  07/19/2011   Procedure:  INTRA OPERATIVE ARTERIOGRAM;  Surgeon: Pryor Ochoa, MD;  Location: [facility] OR;  Service: Vascular;  Laterality: Right;   NM MYOVIEW LTD  07/2011   Normal EF, very small area of basal-mid inferior ischemia; LOW RISK   PENILE PROSTHESIS  REMOVAL     PENILE PROSTHESIS IMPLANT     PR VEIN BYPASS GRAFT,AORTO-FEM-POP  07/19/2011   Right  Fem/Pop BPG   PROSTATE BIOPSY     prostatectomy retropubic radical  07/15/2002   with nerve sparing, Gleason 3+4=7   Reclast  07/25/2013   RENAL DUPLEX  07/01/2012   Left renal artery 60-99% diameter reduction   TRANSTHORACIC ECHOCARDIOGRAM  01/2017   Normal EF and overall function   Patient Active Problem List   Diagnosis Date Noted   COVID-19 virus infection 02/10/2022   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   Bilateral low back pain without sciatica 01/19/2021   Cardiovascular symptoms 01/19/2021   Chronic kidney disease, stage 3a (HCC) 01/19/2021   Encounter for general adult medical examination with abnormal findings 01/19/2021   Gangrene of toe of right foot (HCC) 01/19/2021   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) 01/19/2021   Moderate persistent asthma with (acute) exacerbation 01/19/2021   Nephrosclerosis 01/19/2021   Osteoporosis 01/19/2021   Scoliosis 01/19/2021   Tobacco user 01/19/2021   Urinary incontinence 01/19/2021   Malnutrition of moderate degree 12/29/2020   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) 12/28/2020   Ischemic ulcer of foot, right, with fat layer exposed (HCC) 12/19/2020   COPD (chronic obstructive pulmonary disease) (HCC) 12/19/2020   Lumbar radiculopathy 05/28/2020   Posterior vitreous detachment of left eye 08/13/2019   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 08/13/2019   Retinal layer separation 08/13/2019   Primary open angle glaucoma of both eyes, severe stage 08/13/2019   Rhinitis, chronic 03/12/2018    Degeneration of lumbar intervertebral disc 08/28/2017   Lung field abnormal finding on examination 07/19/2017   COPD GOLD II   07/19/2017   Carotid occlusion, right 08/11/2015   Aneurysm, cerebral 08/11/2015   Hydronephrosis, left 06/06/2015   Kidney disease, chronic, stage II (GFR 60-89 ml/min) 06/06/2015   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   Dizziness 12/16/2013   Vertigo    Left renal artery stenosis - 70% by angiography; increased velocity on recent Dopplers (60-99%) 12/08/2012    Class: Diagnosis of   Hyperlipidemia LDL goal <70 12/08/2012   Personal history of colonic adenomas 11/15/2012   Pain in limb 02/13/2012   Peripheral vascular disease (HCC) 08/07/2011   Atherosclerosis of native artery of extremity with intermittent claudication (HCC) 06/13/2011   Prostate cancer (HCC) 02/16/2011   Primary hypertension 12/09/2010     PCP: Merri Brunette  REFERRING  PROVIDER: [name]  REFERRING DIAG: Low back pain   THERAPY DIAG:  Other low back pain  Muscle weakness (generalized)  Other abnormalities of gait and mobility  Rationale for Evaluation and Treatment: Rehabilitation  ONSET DATE:     SUBJECTIVE:   SUBJECTIVE STATEMENT: Pt states continued/significant pain in back anytime his up standing/walking. Feels back is tired today from doing more walking/work around the house.    Eval: Pt states ongoing pain in bil low back, into bil hips. He has had 2 or 3 epidurals. Did feel some relief with this, but not long lasting. He states much pain with standing and walking only a few minutes (about 10 )  causing him to need to sit down. Sitting and laying he has minimal pain. He was seen in PT previously, about 1 year ago. He also states weakness in his legs L >R and has fallen a couple times because of his leg  giving out.  Did have recent injection in hip and SIJ, he is unable to determine if this helped.  He Uses 4 WW for longer distances, so he can sit down.  Uses cane the rest of the time, using cane today.    PERTINENT HISTORY:  HTN, prostate CA, Osteoporosis, COPD, CKD, brain aneurysm, previous stroke?    PAIN:  Are you having pain? Yes: NPRS scale: 7-8/10 Pain location: Bil low back, into hips and legs (as far as mid calf L LE)  Pain description: sore, dull with sharp shooting pains  Aggravating factors: standing Relieving factors: rest, laying down, sitting.   PRECAUTIONS: None  WEIGHT BEARING RESTRICTIONS: No  FALLS:  Has patient fallen in last 6 months? Yes. Number of falls 2 in the last year , L leg giving out, states weakness or pain    PLOF: Independent  PATIENT GOALS:  Decreased pain   NEXT MD VISIT:   OBJECTIVE:   DIAGNOSTIC FINDINGS:   PATIENT SURVEYS:    COGNITION: Overall cognitive status: Within functional limits for tasks assessed     SENSATION: WFL  EDEMA:    POSTURE:    standing: noted scoliosis, and trunk shift to R.     LOWER EXTREMITY ROM: Lumbar ROM:   flexion: mod limitation,  Ext: significant limitation,  SB: mod/significant limitation -  Hips: WFL,   Knees: WFL  LOWER EXTREMITY MMT:  MMT Left eval Right  eval Left 08/01/22 Right 08/01/22  Hip flexion 4- 4- 3- 3  Hip extension      Hip abduction 4- 4-    Hip adduction      Hip internal rotation      Hip external rotation 4- 4-    Knee flexion 4- 4+ 3+ 4  Knee extension 4 5 4+ 5  Ankle dorsiflexion      Ankle plantarflexion 3- unable to do HR (bil)  3- 3- unable to do HR (bil)  4- 4-  Ankle inversion      Ankle eversion       (Blank rows = not tested)  LOWER EXTREMITY SPECIAL TESTS:    GAIT: Distance walked: 100 ft  Assistive device utilized: Single point cane Level of assistance: Modified independence Comments: Decreased foot clearance on L, decreased hip and knee flexion  on L, slow speed, poor balance,    TODAY'S TREATMENT:  DATE:   08/10/2022 Therapeutic Exercise: Aerobic:   Supine:  Supine march with GTB x 15;  Clams GTB x 20 with TA;   SLR x 5 on L:  bridge 2 x 10 ;             Seated:  QL stretch 3x30 seconds ,   S/L hip abd 2 x 10 on L;  Standing:  STS x 10; marching x 20 at counter,  gait SPC 45 ft x 2, cuing for increasing hip/knee flexion for clearance. Stretches:   Neuromuscular Re-education: Manual Therapy: long leg distraction on L for lumbar;  Therapeutic Activity: Self Care            Gait:     PATIENT EDUCATION:  Education details:  reviewed HEP Person educated: Patient Education method: Explanation, Demonstration, Tactile cues, Verbal cues, and Handouts Education comprehension: verbalized understanding, returned demonstration, verbal cues required, tactile cues required, and needs further education   HOME EXERCISE PROGRAM:  Access Code: [email or web address]   CLINICAL IMPRESSION: 08/10/2022 Pt able to perform ther ex, but still very challenged due to much weakness in LE. He has been doing HEP at home more consistently. He continues to have significant pain in his back when standing/walking. He has been seen for 9 visits, and reports no improvement in his pain. He also has not had improvements in strength for LE, and continues to be a fall risk due to poor foot/leg clearance with gait. Pt will be referred back to the Dr. At this time. Pt in agreement with plan.   Eval: Patient presents with primary complaint of  pain in bil low back. He has ongoing, significant pain and very low tolerance for standing and walking due to pain. He has much weakness in L LE, and poor gait mechanics, with decreased clearance of L foot/LE. He will benefit from strengthening as able for L leg, gait training, and education on pain management for  low back pain.  Pt with decreased ability for full functional activities. Pt will  benefit from skilled PT to improve deficits and pain and to return to PLOF.   OBJECTIVE IMPAIRMENTS: Abnormal gait, decreased activity tolerance, decreased balance, decreased knowledge of use of DME, decreased mobility, difficulty walking, decreased ROM, decreased strength, improper body mechanics, and pain.   ACTIVITY LIMITATIONS: lifting, bending, standing, squatting, stairs, transfers, dressing, hygiene/grooming, and locomotion level  PARTICIPATION LIMITATIONS: meal prep, shopping, and community activity  PERSONAL FACTORS: 1 comorbidity: possible/previous stroke, L weakness  are also affecting patient's functional outcome.   REHAB POTENTIAL: Fair chronic pain  CLINICAL DECISION MAKING: Stable/uncomplicated  EVALUATION COMPLEXITY: Low   GOALS: Goals reviewed with patient? Yes  SHORT TERM GOALS: Target date: 07/27/2022    Pt to be independent with initial HEP  Goal status: MET 08/01/22    LONG TERM GOALS: Target date: 09/07/2022   Pt to be independent with final HEP  Goal status: MET  2.  Pt to demo improved strength of L LE to at least 4/5 to improve stability and gait.   Goal status: not met   3.  Pt to demo improved ambulation with LRAD, with improved foot and LE clearance on L, for at least 150 ft, and balance WFL,  to improve safety and falls.   Goal status: not met   4. Pt to be independent with self management/pain relief techniques for low back pain.   Goal status: MET      PLAN:  PT FREQUENCY: 1-2x/week  PT DURATION:  8 weeks  PLANNED INTERVENTIONS: Therapeutic exercises, Therapeutic activity, Neuromuscular re-education, Patient/Family education, Self Care, Joint mobilization, Joint manipulation, Stair training, Orthotic/Fit training, DME instructions, Aquatic Therapy, Dry Needling, Electrical stimulation, Cryotherapy, Moist heat, Taping, Ultrasound, Ionotophoresis 4mg /ml  Dexamethasone, Manual therapy,  Vasopneumatic device, Traction, Spinal manipulation, Spinal mobilization,Balance training, Gait training,   PLAN FOR NEXT SESSION:   Sedalia Muta, PT, DPT 7:01 PM  08/10/22   PHYSICAL THERAPY DISCHARGE SUMMARY  Visits from Start of Care: 9   Plan: Patient agrees to discharge.  Patient goals were partially met. Patient is being discharged due to - pt had f/u with MD, did not return to PT due to minimal relief.      Sedalia Muta, PT, DPT 9:15 AM  10/18/22

## 2022-08-17 NOTE — Progress Notes (Signed)
I, Stevenson Clinch, CMA acting as a scribe for [name], MD.  Lance Dillon is a 82 y.o. male who presents to [facility] today for chronic bilat low back and L hip pain. Pt was last seen by Dr. Denyse Amass on 05/12/22 and he was given a R SI joint and L GT steroid injections and a lumbar ESI was ordered. His insurance denied the [facility] and requested more current PT treatments. Referral was placed to PT, and he's completed 9 visits.   Today, pt reports continued low back pain radiating into the gluteal region and down the legs to the knees. Feels that PT was helpful for strength and mobility but continues to have pain.   Dx imaging: 09/27/21 L-spine MRI 08/24/21 L-spine & pelvis XR  Pertinent review of systems: no fever or chills  Relevant historical information: chronic back pain    Exam:  BP 110/64   Ht 5\' 7"  (1.702 m)   Wt 162 lb (73.5 kg)   BMI 25.37 kg/m  General: Well Developed, well nourished, and in no acute distress.   MSK: Lspine. Normal appearing. TTP BL SI joints.  Decreased lumbar motion.  Lower extremity strength intact BL    Lab and Radiology Results  Procedure: Real-time Ultrasound Guided Injection of Rt SI joint injection  Device: Philips Affiniti 50G/GE Logiq Images permanently stored and available for review in PACS Verbal informed consent obtained.  Discussed risks and benefits of procedure. Warned about infection, bleeding, hyperglycemia damage to structures among others. Patient expresses understanding and agreement Time-out conducted.   Noted no overlying erythema, induration, or other signs of local infection.   Skin prepped in a sterile fashion.   Local anesthesia: Topical Ethyl chloride.   With sterile technique and under real time ultrasound guidance:  40mg  kenalog and 2ml marcaine injected into SI joint. Fluid seen entering the joint.   Completed without difficulty   Pain Moderatly resolved suggesting accurate placement  of the medication.   Advised to call if fevers/chills, erythema, induration, drainage, or persistent bleeding.   Images permanently stored and available for review in the ultrasound unit.  Impression: Technically successful ultrasound guided injection.   Procedure: Real-time Ultrasound Guided Injection of left SI joint  Device: Philips Affiniti 50G/GE Logiq Images permanently stored and available for review in PACS Verbal informed consent obtained.  Discussed risks and benefits of procedure. Warned about infection, bleeding, hyperglycemia damage to structures among others. Patient expresses understanding and agreement Time-out conducted.   Noted no overlying erythema, induration, or other signs of local infection.   Skin prepped in a sterile fashion.   Local anesthesia: Topical Ethyl chloride.   With sterile technique and under real time ultrasound guidance:  40mg  kenalog and 2ml marcain injected into SI joint. Fluid seen entering the joint.   Completed without difficulty   Pain Moderately resolved suggesting accurate placement of the medication.   Advised to call if fevers/chills, erythema, induration, drainage, or persistent bleeding.   Images permanently stored and available for review in the ultrasound unit.  Impression: Technically successful ultrasound guided injection.        Assessment and Plan: 82 y.o. male with Chronic low back pain. Radford has had extensive treatment including facet injections, ESI and PT. His best response so far was moderate results with SI joint injection in April.  Plan to repeat BL SI injection today and refer to pain management to consider other procedures and injections including spinal nerve stimulator.  PDMP not reviewed this encounter. Orders Placed This Encounter  Procedures   [place] LIMITED JOINT SPACE STRUCTURES LOW BILAT(NO LINKED CHARGES)    Order Specific Question:   Reason for Exam (SYMPTOM  OR DIAGNOSIS REQUIRED)    Answer:   bilat  lbp/ SI joint    Order Specific Question:   Preferred imaging location?    Answer:   [facility] referral to Pain Clinic    Referral Priority:   Routine    Referral Type:   Consultation    Referral Reason:   Specialty Services Required    Referred to Provider:   Renaldo Fiddler, MD    Requested Specialty:   Pain Medicine    Number of Visits Requested:   1   No orders of the defined types were placed in this encounter.    Discussed warning signs or symptoms. Please see discharge instructions. Patient expresses understanding.   The above documentation has been reviewed and is accurate and complete [name], M.D.

## 2022-08-18 ENCOUNTER — Other Ambulatory Visit: Payer: Self-pay

## 2022-08-18 ENCOUNTER — Encounter: Payer: Self-pay | Admitting: Family Medicine

## 2022-08-18 ENCOUNTER — Ambulatory Visit: Payer: Medicare HMO | Admitting: Family Medicine

## 2022-08-18 VITALS — BP 110/64 | Ht 67.0 in | Wt 162.0 lb

## 2022-08-18 DIAGNOSIS — M5416 Radiculopathy, lumbar region: Secondary | ICD-10-CM | POA: Diagnosis not present

## 2022-08-18 DIAGNOSIS — M533 Sacrococcygeal disorders, not elsewhere classified: Secondary | ICD-10-CM | POA: Diagnosis not present

## 2022-08-18 DIAGNOSIS — M545 Low back pain, unspecified: Secondary | ICD-10-CM | POA: Diagnosis not present

## 2022-08-18 DIAGNOSIS — M810 Age-related osteoporosis without current pathological fracture: Secondary | ICD-10-CM | POA: Diagnosis not present

## 2022-08-18 DIAGNOSIS — G8929 Other chronic pain: Secondary | ICD-10-CM

## 2022-08-18 DIAGNOSIS — E785 Hyperlipidemia, unspecified: Secondary | ICD-10-CM | POA: Diagnosis not present

## 2022-08-18 NOTE — Patient Instructions (Addendum)
Thank you for coming in today.  You received an injection today. Seek immediate medical attention if the joint becomes red, extremely painful, or is oozing fluid.  I've referred you to Dr. Lorrine Kin for pain management.  Let us know if you don't hear from them in one week.   Check back as needed

## 2022-08-23 DIAGNOSIS — E538 Deficiency of other specified B group vitamins: Secondary | ICD-10-CM | POA: Diagnosis not present

## 2022-08-23 DIAGNOSIS — J449 Chronic obstructive pulmonary disease, unspecified: Secondary | ICD-10-CM | POA: Diagnosis not present

## 2022-08-23 DIAGNOSIS — I739 Peripheral vascular disease, unspecified: Secondary | ICD-10-CM | POA: Diagnosis not present

## 2022-08-23 DIAGNOSIS — Z0000 Encounter for general adult medical examination without abnormal findings: Secondary | ICD-10-CM | POA: Diagnosis not present

## 2022-08-23 DIAGNOSIS — N1831 Chronic kidney disease, stage 3a: Secondary | ICD-10-CM | POA: Diagnosis not present

## 2022-08-23 DIAGNOSIS — I10 Essential (primary) hypertension: Secondary | ICD-10-CM | POA: Diagnosis not present

## 2022-08-23 DIAGNOSIS — R32 Unspecified urinary incontinence: Secondary | ICD-10-CM | POA: Diagnosis not present

## 2022-08-23 DIAGNOSIS — I700 Atherosclerosis of aorta: Secondary | ICD-10-CM | POA: Diagnosis not present

## 2022-08-23 DIAGNOSIS — M545 Low back pain, unspecified: Secondary | ICD-10-CM | POA: Diagnosis not present

## 2022-08-23 DIAGNOSIS — I6523 Occlusion and stenosis of bilateral carotid arteries: Secondary | ICD-10-CM | POA: Diagnosis not present

## 2022-08-23 DIAGNOSIS — M810 Age-related osteoporosis without current pathological fracture: Secondary | ICD-10-CM | POA: Diagnosis not present

## 2022-08-23 DIAGNOSIS — I712 Thoracic aortic aneurysm, without rupture, unspecified: Secondary | ICD-10-CM | POA: Diagnosis not present

## 2022-09-08 ENCOUNTER — Ambulatory Visit: Payer: Medicare HMO | Admitting: Podiatry

## 2022-09-25 ENCOUNTER — Encounter: Payer: Self-pay | Admitting: Podiatry

## 2022-09-25 ENCOUNTER — Ambulatory Visit: Payer: Medicare HMO | Admitting: Podiatry

## 2022-09-25 DIAGNOSIS — B351 Tinea unguium: Secondary | ICD-10-CM

## 2022-09-25 DIAGNOSIS — S98131S Complete traumatic amputation of one right lesser toe, sequela: Secondary | ICD-10-CM

## 2022-09-25 DIAGNOSIS — I739 Peripheral vascular disease, unspecified: Secondary | ICD-10-CM | POA: Diagnosis not present

## 2022-09-25 DIAGNOSIS — M79675 Pain in left toe(s): Secondary | ICD-10-CM | POA: Diagnosis not present

## 2022-09-25 DIAGNOSIS — M79674 Pain in right toe(s): Secondary | ICD-10-CM | POA: Diagnosis not present

## 2022-09-25 NOTE — Progress Notes (Signed)
This patient presents  to my office for at risk foot care.  This patient requires this care by a professional since this patient will be at risk due to having PVD and kidney disease.  He has amputation fourth toe right foot.This patient is unable to cut nails himself since the patient cannot reach his nails.These nails are painful walking and wearing shoes.  This patient presents for at risk foot care today.  General Appearance  Alert, conversant and in no acute stress.  Vascular  Dorsalis pedis and posterior tibial  pulses are  weakly/absent palpable  bilaterally.  Capillary return is within normal limits  bilaterally. Temperature is within normal limits  bilaterally.  Neurologic  Senn-Weinstein monofilament wire test within normal limits  bilaterally. Muscle power within normal limits bilaterally.  Nails Thick disfigured discolored nails with subungual debris  hallux nails bilaterally. No evidence of bacterial infection or drainage bilaterally.  Orthopedic  No limitations of motion  feet .  No crepitus or effusions noted.  No bony pathology or digital deformities noted. Amputation fourth toe right foot.  Skin  normotropic skin with no porokeratosis noted bilaterally.  No signs of infections or ulcers noted.     Onychomycosis  Pain in right toes  Pain in left toes  Consent was obtained for treatment procedures.   Mechanical debridement of nails  bilaterally performed with a nail nipper.  Filed with dremel without incident.    Return office visit     3 months                Told patient to return for periodic foot care and evaluation due to potential at risk complications.   Helane Gunther DPM

## 2022-09-27 DIAGNOSIS — Z6825 Body mass index (BMI) 25.0-25.9, adult: Secondary | ICD-10-CM | POA: Diagnosis not present

## 2022-09-27 DIAGNOSIS — M461 Sacroiliitis, not elsewhere classified: Secondary | ICD-10-CM | POA: Diagnosis not present

## 2022-10-11 DIAGNOSIS — M461 Sacroiliitis, not elsewhere classified: Secondary | ICD-10-CM | POA: Diagnosis not present

## 2022-10-14 ENCOUNTER — Other Ambulatory Visit: Payer: Self-pay | Admitting: Cardiology

## 2022-10-14 DIAGNOSIS — I70213 Atherosclerosis of native arteries of extremities with intermittent claudication, bilateral legs: Secondary | ICD-10-CM

## 2022-10-14 DIAGNOSIS — I70222 Atherosclerosis of native arteries of extremities with rest pain, left leg: Secondary | ICD-10-CM

## 2022-10-18 ENCOUNTER — Other Ambulatory Visit: Payer: Self-pay | Admitting: Cardiovascular Disease

## 2022-10-30 DIAGNOSIS — M810 Age-related osteoporosis without current pathological fracture: Secondary | ICD-10-CM | POA: Diagnosis not present

## 2022-11-02 DIAGNOSIS — H401131 Primary open-angle glaucoma, bilateral, mild stage: Secondary | ICD-10-CM | POA: Diagnosis not present

## 2022-11-07 ENCOUNTER — Ambulatory Visit (HOSPITAL_COMMUNITY): Payer: Medicare HMO | Attending: Cardiology

## 2022-11-07 ENCOUNTER — Other Ambulatory Visit: Payer: Medicare HMO

## 2022-11-07 DIAGNOSIS — I7121 Aneurysm of the ascending aorta, without rupture: Secondary | ICD-10-CM | POA: Diagnosis not present

## 2022-11-07 DIAGNOSIS — Z23 Encounter for immunization: Secondary | ICD-10-CM | POA: Diagnosis not present

## 2022-11-07 LAB — ECHOCARDIOGRAM COMPLETE
Area-P 1/2: 2.62 cm2
P 1/2 time: 546 ms
S' Lateral: 2.7 cm

## 2022-11-14 DIAGNOSIS — M461 Sacroiliitis, not elsewhere classified: Secondary | ICD-10-CM | POA: Diagnosis not present

## 2022-11-16 ENCOUNTER — Ambulatory Visit: Payer: Self-pay | Admitting: Cardiology

## 2022-11-21 ENCOUNTER — Other Ambulatory Visit (HOSPITAL_COMMUNITY): Payer: Self-pay | Admitting: Interventional Radiology

## 2022-11-21 DIAGNOSIS — I671 Cerebral aneurysm, nonruptured: Secondary | ICD-10-CM

## 2022-11-22 DIAGNOSIS — M545 Low back pain, unspecified: Secondary | ICD-10-CM | POA: Diagnosis not present

## 2022-11-22 DIAGNOSIS — I10 Essential (primary) hypertension: Secondary | ICD-10-CM | POA: Diagnosis not present

## 2022-11-22 DIAGNOSIS — E785 Hyperlipidemia, unspecified: Secondary | ICD-10-CM | POA: Diagnosis not present

## 2022-11-29 ENCOUNTER — Ambulatory Visit (HOSPITAL_COMMUNITY)
Admission: RE | Admit: 2022-11-29 | Discharge: 2022-11-29 | Disposition: A | Discharge status: Home / Self Care | Payer: Medicare HMO | Source: Ambulatory Visit | Attending: Interventional Radiology | Admitting: Interventional Radiology

## 2022-11-29 DIAGNOSIS — I671 Cerebral aneurysm, nonruptured: Secondary | ICD-10-CM | POA: Insufficient documentation

## 2022-11-29 DIAGNOSIS — I6523 Occlusion and stenosis of bilateral carotid arteries: Secondary | ICD-10-CM | POA: Diagnosis not present

## 2022-12-25 ENCOUNTER — Encounter: Payer: Self-pay | Admitting: Podiatry

## 2022-12-25 ENCOUNTER — Ambulatory Visit: Payer: Medicare HMO | Admitting: Podiatry

## 2022-12-25 DIAGNOSIS — M79674 Pain in right toe(s): Secondary | ICD-10-CM

## 2022-12-25 DIAGNOSIS — M79675 Pain in left toe(s): Secondary | ICD-10-CM

## 2022-12-25 DIAGNOSIS — B351 Tinea unguium: Secondary | ICD-10-CM

## 2022-12-25 DIAGNOSIS — S98131S Complete traumatic amputation of one right lesser toe, sequela: Secondary | ICD-10-CM | POA: Diagnosis not present

## 2022-12-25 DIAGNOSIS — I739 Peripheral vascular disease, unspecified: Secondary | ICD-10-CM

## 2022-12-25 NOTE — Progress Notes (Signed)
This patient presents  to my office for at risk foot care.  This patient requires this care by a professional since this patient will be at risk due to having PVD and kidney disease.  He has amputation fourth toe right foot.This patient is unable to cut nails himself since the patient cannot reach his nails.These nails are painful walking and wearing shoes.  This patient presents for at risk foot care today.  General Appearance  Alert, conversant and in no acute stress.  Vascular  Dorsalis pedis and posterior tibial  pulses are  weakly/absent palpable  bilaterally.  Capillary return is within normal limits  bilaterally. Temperature is within normal limits  bilaterally.  Neurologic  Senn-Weinstein monofilament wire test within normal limits  bilaterally. Muscle power within normal limits bilaterally.  Nails Thick disfigured discolored nails with subungual debris  hallux nails bilaterally. No evidence of bacterial infection or drainage bilaterally.  Orthopedic  No limitations of motion  feet .  No crepitus or effusions noted.  No bony pathology or digital deformities noted. Amputation fourth toe right foot.  Skin  normotropic skin with no porokeratosis noted bilaterally.  No signs of infections or ulcers noted.     Onychomycosis  Pain in right toes  Pain in left toes  Consent was obtained for treatment procedures.   Mechanical debridement of nails  bilaterally performed with a nail nipper.  Filed with dremel without incident.    Return office visit     3 months                Told patient to return for periodic foot care and evaluation due to potential at risk complications.   Helane Gunther DPM

## 2023-01-01 ENCOUNTER — Telehealth (HOSPITAL_COMMUNITY): Payer: Self-pay

## 2023-01-01 NOTE — Telephone Encounter (Signed)
Pt would like to come in to discuss recent imaging. Pt's wife just broke her foot so I will call back the first week in December to schedule consult. AB

## 2023-01-01 NOTE — Telephone Encounter (Signed)
Called regarding recent imaging, no answer, left vm. AB

## 2023-01-02 ENCOUNTER — Other Ambulatory Visit (HOSPITAL_COMMUNITY): Payer: Self-pay | Admitting: Interventional Radiology

## 2023-01-02 ENCOUNTER — Telehealth (HOSPITAL_COMMUNITY): Payer: Self-pay

## 2023-01-02 DIAGNOSIS — I671 Cerebral aneurysm, nonruptured: Secondary | ICD-10-CM

## 2023-01-02 NOTE — Telephone Encounter (Signed)
-----   Message from Villa Herb sent at 12/27/2022  2:18 PM EST ----- Regarding: MRA follow up Hi!  Dr. Corliss Skains reviewed this patient's imaging and noted an increase in the size of his aneurysm. If he does not have any new or worsening symptoms then he can continue with imaging surveillance with MRA in 1 year. If he has new/worsening symptoms or he would like to discuss possible treatment then he will need to come in and see Dev.  Thanks, Carollee Herter

## 2023-01-03 DIAGNOSIS — M5136 Other intervertebral disc degeneration, lumbar region with discogenic back pain only: Secondary | ICD-10-CM | POA: Diagnosis not present

## 2023-01-03 DIAGNOSIS — M461 Sacroiliitis, not elsewhere classified: Secondary | ICD-10-CM | POA: Diagnosis not present

## 2023-01-03 DIAGNOSIS — I671 Cerebral aneurysm, nonruptured: Secondary | ICD-10-CM | POA: Diagnosis not present

## 2023-01-17 NOTE — Progress Notes (Signed)
He may be a candidate for the [employer] study with retatrutide in patients with ASCVD and CKD.

## 2023-01-21 NOTE — Progress Notes (Signed)
Cardiology Office Note:  .   Date:  01/26/2023  ID:  IVIN KONECNY, DOB Feb 20, 1940, MRN [id number] PCP: Merri Brunette, MD  [facility] Providers Cardiologist:  None   History of Present Illness: .   Lance Dillon is a 82 y.o.  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    Past medical history significant for hypertension, hyperlipidemia, chronic stage IIIa creatinine disease, chronic anemia and remote heavy tobacco use disorder, quit smoking in 2004  This is a 5-month office visit, patient is presently doing well and symptoms of claudication are minimal.  He has chronic dyspnea that is also remained stable.  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  Otherwise he has no specific complaints, he is accompanied by his daughter.  Review of Systems  Cardiovascular:  Positive for claudication. Negative for chest pain, dyspnea on exertion and leg swelling.    Labs   Lab Results  Component Value Date   NA 135 02/04/2022   K 3.7 02/04/2022   CO2 24 02/04/2022   GLUCOSE 163 (H) 02/04/2022   BUN 31 (H) 02/04/2022   CREATININE 1.40 (H) 02/04/2022   CALCIUM 9.5 02/04/2022   EGFR 58 (L) 10/07/2021   GFRNONAA 50 (L) 02/04/2022      Latest Ref Rng & Units 02/04/2022    7:33 PM 10/07/2021    1:10 PM 12/29/2020    1:35 AM  BMP  Glucose 70 - 99 mg/dL 478  85  295   BUN 8 - 23 mg/dL 31  28  27    Creatinine 0.61  - 1.24 mg/dL 6.21  3.08  6.57   BUN/Creat Ratio 10 - 24  22    Sodium 135 - 145 mmol/L 135  141  133   Potassium 3.5 - 5.1 mmol/L 3.7  4.3  3.8   Chloride 98 - 111 mmol/L 98  98  101   CO2 22 - 32 mmol/L 24  25  27    Calcium 8.9 - 10.3 mg/dL 9.5  84.6  8.1       Latest Ref Rng & Units 02/04/2022    7:33 PM 12/30/2020    1:12 AM 12/29/2020    1:35 AM  CBC  WBC 4.0 - 10.5 K/uL 10.3  4.3  4.1   Hemoglobin 13.0 - 17.0 g/dL 96.2  8.4  7.7   Hematocrit 39.0 - 52.0 % 40.2  25.9  23.7   Platelets 150 - 400 K/uL 160  185  168    External Labs:  Labs 08/18/2022:  Serum glucose 1 1 6  mg, BUN 29, creatinine 1.29, EGFR 56 mL, potassium 4.3, LFTs normal.  Total cholesterol 127, triglycerides 171, HDL 48, LDL 51.  Hb 14.1/HCT 42.5, platelets 169, normal indicis.  Physical Exam:   VS:  BP 120/70 (BP Location: Left Arm, Patient Position: Sitting, Cuff Size: Normal)   Pulse 68   Resp 16   Ht 5\' 7"  (1.702 m)   Wt 171 lb 6.4  oz (77.7 kg)   SpO2 95%   BMI 26.85 kg/m    Wt Readings from Last 3 Encounters:  01/26/23 171 lb 6.4 oz (77.7 kg)  08/18/22 162 lb (73.5 kg)  06/14/22 168 lb 12.8 oz (76.6 kg)     Physical Exam Neck:     Vascular: No carotid bruit or JVD.  Cardiovascular:     Rate and Rhythm: Normal rate and regular rhythm.     Pulses: Intact distal pulses.          Carotid pulses are 2+ on the right side and 2+ on the left side.      Femoral pulses are 2+ on the right side and 1+ on the left side.      Popliteal pulses are 2+ on the right side and 0 on the left side.       Dorsalis pedis pulses are 2+ on the right side and 1+ on the left side.       Posterior tibial pulses are 0 on the right side and 0 on the left side.     Heart sounds: Normal heart sounds. No murmur heard.    No gallop.  Pulmonary:     Effort: Pulmonary effort is normal.     Breath sounds: Normal breath sounds.  Abdominal:     General: Bowel sounds are normal.     Palpations: Abdomen is soft.   Musculoskeletal:     Right lower leg: No edema.     Left lower leg: No edema.     Studies Reviewed: .    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   CT Chest 08/28/2021: 1. No imaging findings to suggest interstitial lung disease.  2. Diffuse bronchial wall thickening with moderate centrilobular and paraseptal emphysema; imaging findings suggestive of underlying COPD. 3. Extensive pleuroparenchymal thickening and architectural distortion in the lung apices, most compatible with areas of chronic post infectious or inflammatory scarring. 4. Aortic atherosclerosis, in addition to left main and three-vessel coronary artery disease. Large calcified atherosclerotic plaque at the ostium of the left subclavian artery, incompletely evaluated on today's noncontrast CT examination, but likely associated with severe stenosis. 5. There are calcifications of the aortic valve and mitral annulus. Echocardiographic correlation for evaluation of potential valvular dysfunction may be warranted if clinically indicated. 6. Ectasia of ascending thoracic aorta (4.3 cm in diameter).  ABI 11/02/2021: This exam reveals normal perfusion of the right lower extremity (ABI 1.06).  This exam reveals moderately decreased perfusion of the left lower extremity, noted at the dorsalis pedis and post tibial artery level (ABI 0.5).  Mildly abnormal biphasic waveform at the right ankle and severely abnormal monophasic waveform at the left ankle.  Consider further work up.   Compared to external report, no change from 04/20/2021.  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  Echocardiogram 11/07/2022: 1. Left ventricular ejection fraction, by estimation, is 60 to 65%. The left ventricle has normal function. Left ventricular endocardial border not optimally defined to evaluate regional wall motion. There is moderate left ventricular hypertrophy.  Indeterminate due to MAC. 2.  Right ventricular systolic function is normal. The right ventricular size is normal. Mildly increased right ventricular wall thickness. There is normal pulmonary artery systolic pressure. 3. Left atrial size was mildly dilated. 4. The mitral valve is abnormal. Trivial mitral valve regurgitation. No evidence of mitral stenosis. Moderate to severe mitral annular calcification. 5. The aortic valve is calcified. There is moderate calcification  of the aortic valve. There is moderate thickening of the aortic valve. Aortic valve regurgitation is moderate. Aortic valve sclerosis is present, with no evidence of aortic valve stenosis. 6. Aortic dilatation noted. There is mild dilatation of the ascending aorta, measuring 43 mm. No significant change from 05/18/2022 with regard to patient aortic aneurysm.   EKG:    EKG Interpretation Date/Time:  Friday January 26 2023 08:07:46 EST Ventricular Rate:  68 PR Interval:  198 QRS Duration:  74 QT Interval:  382 QTC Calculation: 406 R Axis:   -12  Text Interpretation: EKG 1 01/26/2023: Normal sinus rhythm at rate of 68 bpm, normal EKG.  Compared to 02/04/2022, sinus tachycardia not present. Confirmed by Delrae Rend [phone]) on 01/26/2023 8:23:05 AM    EKG 05/18/2022: Normal sinus rhythm at rate of 66 bpm, normal axis, incomplete right bundle branch block. Normal EKG.   Medications and allergies    Allergies  Allergen Reactions   Avelox [Moxifloxacin Hcl In Nacl] Diarrhea   Moxifloxacin Diarrhea and Other (See Comments)   Cilostazol Diarrhea   Cyclobenzaprine Other (See Comments)   Quinolones     Other reaction(s): Unknown   Ramipril Other (See Comments)    Other reaction(s): Unknown     Current Outpatient Medications:    acetaminophen (TYLENOL) 500 MG tablet, Take 500 mg by mouth every 6 (six) hours as needed for mild pain or headache., Disp: , Rfl:    amLODipine (NORVASC) 2.5 MG tablet, TAKE 1 TABLET BY MOUTH AT BEDTIME., Disp: 90 tablet, Rfl:  2   aspirin (ASPIRIN CHILDRENS) 81 MG chewable tablet, Chew 1 tablet (81 mg total) by mouth daily., Disp: , Rfl:    docusate sodium (COLACE) 100 MG capsule, Take 1 capsule (100 mg total) by mouth daily as needed for mild constipation., Disp: , Rfl: 0   fluticasone (FLONASE) 50 MCG/ACT nasal spray, Place 2 sprays into both nostrils daily as needed for rhinitis or allergies (or nasal congestion)., Disp: , Rfl:    latanoprost (XALATAN) 0.005 % ophthalmic solution, Place 1 drop into the left eye at bedtime. , Disp: , Rfl:    loratadine (CLARITIN) 10 MG tablet, 1 tablet Orally Once a day as needed, Disp: , Rfl:    losartan-hydrochlorothiazide (HYZAAR) 100-12.5 MG tablet, Take 1 tablet by mouth daily., Disp: , Rfl:    LUTEIN PO, Take 1 capsule by mouth daily., Disp: , Rfl:    Magnesium 200 MG TABS, Take 1 tablet by mouth daily at 2 PM., Disp: , Rfl:    metoprolol succinate (TOPROL-XL) 25 MG 24 hr tablet, TAKE 1 TABLET (25 MG TOTAL) BY MOUTH DAILY. ** DO NOT CRUSH ** (BETA BLOCKER), Disp: 90 tablet, Rfl: 3   Multiple Vitamin (MULTIVITAMIN WITH MINERALS) TABS tablet, Take 1 tablet by mouth daily., Disp: 30 tablet, Rfl: 0   omeprazole (PRILOSEC) 20 MG capsule, Take 20 mg by mouth daily., Disp: , Rfl:    rosuvastatin (CRESTOR) 10 MG tablet, TAKE 1 TABLET BY MOUTH EVERY DAY (Patient taking differently: Take 10 mg by mouth at bedtime.), Disp: 90 tablet, Rfl: 3   traMADol (ULTRAM) 50 MG tablet, Take 50 mg by mouth 2 (two) times daily., Disp: , Rfl:    vitamin B-12 (CYANOCOBALAMIN) 500 MCG tablet, Take 1 tablet (500 mcg total) by mouth daily., Disp: 30 tablet, Rfl: 0   Vitamin D, Ergocalciferol, (DRISDOL) 1.25 MG (50000 UNIT) CAPS capsule, 1 capsule Orally once weekly for 84 days, Disp: , Rfl:    ASSESSMENT AND PLAN: .  ICD-10-CM   1. Atherosclerosis of native artery of both lower extremities with intermittent claudication (HCC)  I70.213 EKG 12-Lead    2. Aneurysm of ascending aorta without rupture (HCC)   I71.21     3. Primary hypertension  I10     4. Hyperlipidemia LDL goal <70  E78.5       1. Atherosclerosis of native artery of both lower extremities with intermittent claudication (HCC) Patient has been stable and patient had critical limb ischemia for which she was started on aspirin along with Xarelto 2.18m bid. in view of recent increase in right posterior communicating artery aneurysm, I will go ahead and discontinue Xarelto and continue aspirin alone.  I will recheck ABI prior to his next office visit in 6 months.  His capillary refill is <3 seconds in bilateral lower extremity.  2. Aneurysm of ascending aorta without rupture (HCC) I reviewed the results of the echocardiogram, he has very mild ascending aortic aneurysm measuring 4.3 cm that has remained stable.  Will consider repeating echocardiogram in a year.  3.  Primary hypertension Blood pressure is very well-controlled on amlodipine 2.5 mg daily and metoprolol succinate 25 mg daily and losartan HCT 100/12.5 mg in the morning.  Although has stage IIIb chronic kidney disease, renal function also has remained stable.  Continue the same.  4.  Hypercholesterolemia Patient is presently on Crestor 10 mg daily with excellent control of lipids, labs from July 2024 revealing LDL of 51.  No changes in the medications were done today.  I will see him back in 6 months for follow-up and if he remains stable on annual basis.  This was a 50-minute office visit encounter in discussions regarding peripheral arterial disease, decision making regarding medication changes, review of external records and labs, I also personally reviewed the impressions of cerebral angiography (MRA) and discussion regarding discharge making about management of cerebral aneurysm.  I have encouraged him to continue to keep all the appointments with interventional radiology Dr. Corliss Skains.  I also discussed with the patient regarding potential for participating in clinical  trial, [facility] Outcomes trial: To study effect of Retatrutide (GLP-1 agonist) weekly subcu injection on MACE and declining renal function in patients with BMI >27 and ASCVD +/- CKD, 3-year study.  His daughter is present and all questions answered.  Signed,  Yates Decamp, MD, [facility] 01/26/2023, 8:40 AM [facility] [address] [place], [place] [address] Phone: [phone]. Fax:  [phone]

## 2023-01-26 ENCOUNTER — Encounter: Payer: Self-pay | Admitting: Cardiology

## 2023-01-26 ENCOUNTER — Ambulatory Visit: Payer: Medicare HMO | Attending: Cardiology | Admitting: Cardiology

## 2023-01-26 VITALS — BP 120/70 | HR 68 | Resp 16 | Ht 67.0 in | Wt 171.4 lb

## 2023-01-26 DIAGNOSIS — I70213 Atherosclerosis of native arteries of extremities with intermittent claudication, bilateral legs: Secondary | ICD-10-CM | POA: Diagnosis not present

## 2023-01-26 DIAGNOSIS — I7121 Aneurysm of the ascending aorta, without rupture: Secondary | ICD-10-CM

## 2023-01-26 DIAGNOSIS — I10 Essential (primary) hypertension: Secondary | ICD-10-CM

## 2023-01-26 DIAGNOSIS — E785 Hyperlipidemia, unspecified: Secondary | ICD-10-CM

## 2023-01-26 NOTE — Patient Instructions (Signed)
Medication Instructions:  STOP Xarelto *If you need a refill on your cardiac medications before your next appointment, please call your pharmacy*  Testing/Procedures: ABI's  (in 6 months) Your physician has requested that you have an ankle brachial index (ABI). During this test an ultrasound and blood pressure cuff are used to evaluate the arteries that supply the arms and legs with blood. Allow thirty minutes for this exam. There are no restrictions or special instructions.  Please note: We ask at that you not bring children with you during ultrasound (echo/ vascular) testing. Due to room size and safety concerns, children are not allowed in the ultrasound rooms during exams. Our front office staff cannot provide observation of children in our lobby area while testing is being conducted. An adult accompanying a patient to their appointment will only be allowed in the ultrasound room at the discretion of the ultrasound technician under special circumstances. We apologize for any inconvenience.  Follow-Up: At [facility], you and your health needs are our priority.  As part of our continuing mission to provide you with exceptional heart care, we have created designated Provider Care Teams.  These Care Teams include your primary Cardiologist (physician) and Advanced Practice Providers (APPs -  Physician Assistants and Nurse Practitioners) who all work together to provide you with the care you need, when you need it.  Your next appointment:   6 month(s)  Provider:   Yates Decamp, MD

## 2023-02-01 ENCOUNTER — Ambulatory Visit (HOSPITAL_COMMUNITY)
Admission: RE | Admit: 2023-02-01 | Discharge: 2023-02-01 | Disposition: A | Discharge status: Home / Self Care | Payer: Medicare HMO | Source: Ambulatory Visit | Attending: Interventional Radiology | Admitting: Interventional Radiology

## 2023-02-01 DIAGNOSIS — I671 Cerebral aneurysm, nonruptured: Secondary | ICD-10-CM

## 2023-02-12 ENCOUNTER — Ambulatory Visit (HOSPITAL_COMMUNITY)
Admission: RE | Admit: 2023-02-12 | Discharge: 2023-02-12 | Disposition: A | Discharge status: Home / Self Care | Payer: Medicare HMO | Source: Ambulatory Visit | Attending: Interventional Radiology | Admitting: Interventional Radiology

## 2023-02-12 DIAGNOSIS — I6523 Occlusion and stenosis of bilateral carotid arteries: Secondary | ICD-10-CM | POA: Diagnosis not present

## 2023-02-12 DIAGNOSIS — I671 Cerebral aneurysm, nonruptured: Secondary | ICD-10-CM | POA: Diagnosis not present

## 2023-02-12 DIAGNOSIS — R0989 Other specified symptoms and signs involving the circulatory and respiratory systems: Secondary | ICD-10-CM | POA: Diagnosis not present

## 2023-02-14 HISTORY — PX: IR RADIOLOGIST EVAL & MGMT: IMG5224

## 2023-02-22 DIAGNOSIS — M1990 Unspecified osteoarthritis, unspecified site: Secondary | ICD-10-CM | POA: Diagnosis not present

## 2023-02-22 DIAGNOSIS — N1831 Chronic kidney disease, stage 3a: Secondary | ICD-10-CM | POA: Diagnosis not present

## 2023-02-22 DIAGNOSIS — M549 Dorsalgia, unspecified: Secondary | ICD-10-CM | POA: Diagnosis not present

## 2023-02-22 DIAGNOSIS — M2550 Pain in unspecified joint: Secondary | ICD-10-CM | POA: Diagnosis not present

## 2023-02-26 ENCOUNTER — Encounter (HOSPITAL_COMMUNITY): Payer: Medicare HMO

## 2023-03-07 ENCOUNTER — Other Ambulatory Visit: Payer: Self-pay

## 2023-03-07 MED ORDER — AMLODIPINE BESYLATE 2.5 MG PO TABS: 2.5000 mg | ORAL_TABLET | Freq: Every day | ORAL | 3 refills | Status: DC

## 2023-03-28 ENCOUNTER — Ambulatory Visit: Payer: Medicare HMO | Admitting: Podiatry

## 2023-05-09 DIAGNOSIS — D492 Neoplasm of unspecified behavior of bone, soft tissue, and skin: Secondary | ICD-10-CM | POA: Diagnosis not present

## 2023-05-09 DIAGNOSIS — L538 Other specified erythematous conditions: Secondary | ICD-10-CM | POA: Diagnosis not present

## 2023-05-09 DIAGNOSIS — L814 Other melanin hyperpigmentation: Secondary | ICD-10-CM | POA: Diagnosis not present

## 2023-05-09 DIAGNOSIS — D225 Melanocytic nevi of trunk: Secondary | ICD-10-CM | POA: Diagnosis not present

## 2023-05-09 DIAGNOSIS — L570 Actinic keratosis: Secondary | ICD-10-CM | POA: Diagnosis not present

## 2023-05-09 DIAGNOSIS — L821 Other seborrheic keratosis: Secondary | ICD-10-CM | POA: Diagnosis not present

## 2023-05-09 DIAGNOSIS — L820 Inflamed seborrheic keratosis: Secondary | ICD-10-CM | POA: Diagnosis not present

## 2023-05-15 ENCOUNTER — Encounter: Payer: Self-pay | Admitting: Podiatry

## 2023-05-15 ENCOUNTER — Ambulatory Visit: Admitting: Podiatry

## 2023-05-15 DIAGNOSIS — B351 Tinea unguium: Secondary | ICD-10-CM | POA: Diagnosis not present

## 2023-05-15 DIAGNOSIS — M79675 Pain in left toe(s): Secondary | ICD-10-CM

## 2023-05-15 DIAGNOSIS — S98131S Complete traumatic amputation of one right lesser toe, sequela: Secondary | ICD-10-CM | POA: Diagnosis not present

## 2023-05-15 DIAGNOSIS — I739 Peripheral vascular disease, unspecified: Secondary | ICD-10-CM | POA: Diagnosis not present

## 2023-05-15 DIAGNOSIS — M79674 Pain in right toe(s): Secondary | ICD-10-CM

## 2023-05-15 NOTE — Progress Notes (Signed)
 This patient presents  to my office for at risk foot care.  This patient requires this care by a professional since this patient will be at risk due to having PVD and kidney disease.  He has amputation fourth toe right foot.This patient is unable to cut nails himself since the patient cannot reach his nails.These nails are painful walking and wearing shoes.  This patient presents for at risk foot care today.  General Appearance  Alert, conversant and in no acute stress.  Vascular  Dorsalis pedis and posterior tibial  pulses are  weakly/absent palpable  bilaterally.  Capillary return is within normal limits  bilaterally. Temperature is within normal limits  bilaterally.  Neurologic  Senn-Weinstein monofilament wire test within normal limits  bilaterally. Muscle power within normal limits bilaterally.  Nails Thick disfigured discolored nails with subungual debris  hallux nails bilaterally. No evidence of bacterial infection or drainage bilaterally.  Orthopedic  No limitations of motion  feet .  No crepitus or effusions noted.  No bony pathology or digital deformities noted. Amputation fourth toe right foot.  Skin  normotropic skin with no porokeratosis noted bilaterally.  No signs of infections or ulcers noted.     Onychomycosis  Pain in right toes  Pain in left toes  Consent was obtained for treatment procedures.   Mechanical debridement of nails  bilaterally performed with a nail nipper.  Filed with dremel without incident.    Return office visit     3 months                Told patient to return for periodic foot care and evaluation due to potential at risk complications.   Helane Gunther DPM

## 2023-05-21 ENCOUNTER — Telehealth: Payer: Self-pay | Admitting: Cardiology

## 2023-05-21 NOTE — Telephone Encounter (Signed)
 Spoke with wife and she states patient has swelling in his legs and feet. No chest pain, or SOB.  Advised to try monitoring salt intake, elevating legs and feet as much possible. Also to try compression stockings.   Will forward to provider

## 2023-05-21 NOTE — Telephone Encounter (Signed)
 Pt is returning call to a nurse. Please call [phone]

## 2023-05-21 NOTE — Telephone Encounter (Signed)
 Pt c/o swelling/edema: STAT if pt has developed SOB within 24 hours  If swelling, where is the swelling located? Both legs and feet; dark spots located on side and bottom of left foot  How much weight have you gained and in what time span? N/A  Have you gained 2 pounds in a day or 5 pounds in a week? N/A  Do you have a log of your daily weights (if so, list)? No   Are you currently taking a fluid pill? No  Are you currently SOB? No   Have you traveled recently in a car or plane for an extended period of time? No

## 2023-05-21 NOTE — Telephone Encounter (Signed)
 Left voicemail to return call to office

## 2023-05-22 NOTE — Telephone Encounter (Signed)
 Spoke with pt's wife (per DPR) and assured her that Dr. Berry Bristol and his nurse would reach out when they can as they are in clinic today. Pt's wife verbalized understanding. All questions if any were answered.

## 2023-05-22 NOTE — Telephone Encounter (Signed)
 Reviewed with Dr Berry Bristol and he would like patient to have office visit. I spoke with patient's wife. She reports patient has had swelling in his legs and feet for the last 3-4 days.  Also has dark spot on his left foot.  Foot is warm to the touch.  Weight runs 160-165 lbs.  Wife is not sure if he has had any recent weight gain. Appointment made for patient to see Dr Berry Bristol on April 24 at 2:20

## 2023-05-22 NOTE — Telephone Encounter (Signed)
Follow Up:     Wife is calling back to see what was decided.

## 2023-05-24 ENCOUNTER — Other Ambulatory Visit: Payer: Self-pay

## 2023-05-24 ENCOUNTER — Ambulatory Visit: Attending: Cardiology | Admitting: Cardiology

## 2023-05-24 ENCOUNTER — Encounter: Payer: Self-pay | Admitting: Cardiology

## 2023-05-24 VITALS — BP 100/62 | HR 90 | Ht 67.0 in | Wt 161.0 lb

## 2023-05-24 DIAGNOSIS — I10 Essential (primary) hypertension: Secondary | ICD-10-CM

## 2023-05-24 DIAGNOSIS — I70213 Atherosclerosis of native arteries of extremities with intermittent claudication, bilateral legs: Secondary | ICD-10-CM | POA: Diagnosis not present

## 2023-05-24 DIAGNOSIS — I493 Ventricular premature depolarization: Secondary | ICD-10-CM | POA: Diagnosis not present

## 2023-05-24 MED ORDER — METOPROLOL SUCCINATE ER 50 MG PO TB24: 50.0000 mg | ORAL_TABLET | Freq: Every day | ORAL | 3 refills | Status: AC

## 2023-05-24 MED ORDER — LOSARTAN POTASSIUM-HCTZ 50-12.5 MG PO TABS: 1.0000 | ORAL_TABLET | Freq: Every day | ORAL | 3 refills | Status: AC

## 2023-05-24 NOTE — H&P (View-Only) (Signed)
 Cardiology Office Note:  .   Date:  05/24/2023  ID:  Lance Dillon, DOB 04-25-1940, MRN [id number] PCP: Imelda Man, MD  [facility] Providers Cardiologist:  Knox Perl, MD   History of Present Illness: .   Lance Dillon is a 83 y.o. 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    Past medical history significant for hypertension, hyperlipidemia, chronic stage IIIa creatinine disease, chronic anemia and remote heavy tobacco use disorder, quit smoking in 2004.  I am seeing him on an urgent basis as patient has noticed swelling in his legs and also dark spots on his left foot.  In view of known chronic critical limb ischemia involving the left leg, brought him in to be further evaluated.  Discussed the use of AI scribe software for clinical note transcription with the patient, who gave verbal consent to proceed.  History of Present Illness The patient, with a history of peripheral arterial disease, presents with leg pain, noticed a red swelling in his left leg and wanted to be seen. The discomfort is located in the calves and feet, with the left side being more severe. The pain is exacerbated by walking and using a stationary bike. The patient reports that the pain and swelling have not improved despite current medication regimen.  The patient denies any chest pain or shortness of breath.  He is accompanied by his wife.  Labs    Lab Results  Component Value Date   NA 135 02/04/2022   K 3.7 02/04/2022   CO2 24 02/04/2022   GLUCOSE 163 (H) 02/04/2022   BUN  31 (H) 02/04/2022   CREATININE 1.40 (H) 02/04/2022   CALCIUM  9.5 02/04/2022   EGFR 58 (L) 10/07/2021   GFRNONAA 50 (L) 02/04/2022      Latest Ref Rng & Units 02/04/2022    7:33 PM 10/07/2021    1:10 PM 12/29/2020    1:35 AM  BMP  Glucose 70 - 99 mg/dL 086  85  578   BUN 8 - 23 mg/dL 31  28  27    Creatinine 0.61 - 1.24 mg/dL 4.69  6.29  5.28   BUN/Creat Ratio 10 - 24  22    Sodium 135 - 145 mmol/L 135  141  133   Potassium 3.5 - 5.1 mmol/L 3.7  4.3  3.8   Chloride 98 - 111 mmol/L 98  98  101   CO2 22 - 32 mmol/L 24  25  27    Calcium  8.9 - 10.3 mg/dL 9.5  41.3  8.1      External Labs:  Care Everywhere PCP labs 08/18/2022:  Hb 14.1/HCT 42.5, 169.  Serum glucose 106 mg, BUN 29, creatinine 1.29, EGFR 56 mL, potassium 4.3.  Total cholesterol 127, triglycerides 171, HDL 48, LDL 51.  Review of Systems  Cardiovascular:  Positive for claudication (Bilateral, left worse than right). Negative for chest pain, dyspnea on exertion and leg swelling.  Skin:  Positive for suspicious lesions (left foot).   Physical Exam:   VS:  BP 100/62 (BP Location: Left Arm, Patient Position: Sitting, Cuff Size: Normal)  Pulse 90   Ht 5\' 7"  (1.702 m)   Wt 161 lb (73 kg)   SpO2 97%   BMI 25.22 kg/m    Wt Readings from Last 3 Encounters:  05/24/23 161 lb (73 kg)  01/26/23 171 lb 6.4 oz (77.7 kg)  08/18/22 162 lb (73.5 kg)       05/24/2023    2:26 PM 01/26/2023    8:04 AM 08/18/2022    9:09 AM  Vitals with BMI  Height 5\' 7"  5\' 7"  5\' 7"   Weight 161 lbs 171 lbs 6 oz 162 lbs  BMI 25.21 26.84 25.37  Systolic 100 120 161  Diastolic 62 70 64  Pulse 90 68    Physical Exam Neck:     Vascular: No carotid bruit or JVD.  Cardiovascular:     Rate and Rhythm: Normal rate and regular rhythm.     Pulses: Intact distal pulses.          Femoral pulses are 2+ on the right side and 1+ on the left side.      Dorsalis pedis pulses are 1+ on the right side and 0 on the left side.       Posterior tibial  pulses are 0 on the right side and 0 on the left side.     Heart sounds: Normal heart sounds. No murmur heard.    No gallop.  Pulmonary:     Effort: Pulmonary effort is normal.     Breath sounds: Normal breath sounds.  Abdominal:     General: Bowel sounds are normal.     Palpations: Abdomen is soft.  Musculoskeletal:     Right lower leg: No edema.     Left lower leg: No edema.  Skin:    Capillary Refill: Capillary refill takes more than 3 seconds. Right leg capillary refill <2 seconds, left leg greater than 3 seconds.    Findings: No lesion.    Studies Reviewed: .    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  Lexiscan  Nuclear stress test 04/05/2021: Nondiagnostic ECG stress. The heart rate response was consistent with Lexiscan . There is mild motion artifact in the inferior wall. Otherwise myocardial perfusion is normal. Overall LV systolic function is normal without regional wall motion abnormalities. Stress LV EF: 79%. Small volume LV. No previous exam available for comparison. Low risk.  ABI 11/02/2021: This exam reveals normal perfusion of the right lower extremity (ABI 1.06).  This exam reveals moderately decreased perfusion of the left lower extremity, noted at the dorsalis pedis and post tibial artery level (ABI 0.5).  Mildly abnormal biphasic waveform at the right ankle and severely abnormal monophasic waveform at the left ankle.  Consider further work up.   Compared to external report, no change from 04/20/2021.  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  EKG:    EKG Interpretation Date/Time:  Thursday May 24 2023 14:23:58 EDT Ventricular Rate:  90 PR Interval:  188 QRS Duration:  76 QT Interval:  356 QTC Calculation: 435 R Axis:   -30  Text Interpretation: EKG 05/24/2023 at 1422 hrs.: Normal sinus rhythm at the rate of 88 bpm, incomplete right bundle branch block.  Single PVC.  Repeat EKG at 1423 hrs. no change,  frequent PVCs (3 and 1 PAC noted. Confirmed by Dervin Vore, Jagadeesh [phone]) on 05/24/2023 2:45:28 PM   EKG 1 01/26/2023: Normal sinus rhythm at rate of 68 bpm, normal EKG. Compared to 02/04/2022, sinus tachycardia not present.  Medications and allergies    Allergies  Allergen Reactions   Avelox [Moxifloxacin Hcl In Nacl] Diarrhea   Moxifloxacin Diarrhea and Other (See Comments)   Cilostazol  Diarrhea   Cyclobenzaprine Other (See Comments)   Quinolones     Other reaction(s): Unknown   Ramipril Other (See Comments)    Other reaction(s): Unknown    Current Outpatient Medications:    acetaminophen  (TYLENOL ) 500 MG tablet, Take 500 mg by mouth every 6 (six) hours as needed for mild pain or headache., Disp: , Rfl:    amLODipine  (NORVASC ) 2.5 MG tablet, Take 1 tablet (2.5 mg total) by mouth at bedtime., Disp: 90 tablet, Rfl: 3   aspirin  (ASPIRIN  CHILDRENS) 81 MG chewable tablet, Chew 1 tablet (81 mg total) by mouth daily., Disp: , Rfl:    docusate sodium  (COLACE) 100 MG capsule, Take 1 capsule (100 mg total) by mouth daily as needed for mild constipation., Disp: , Rfl: 0   fluticasone  (FLONASE ) 50 MCG/ACT nasal spray, Place 2 sprays into both nostrils daily as needed for rhinitis or allergies (or nasal congestion)., Disp: , Rfl:    latanoprost  (XALATAN ) 0.005 % ophthalmic solution, Place 1 drop into the left eye at bedtime. , Disp: , Rfl:    loratadine (CLARITIN) 10 MG tablet, 1 tablet Orally Once a day as needed, Disp: , Rfl:    losartan -hydrochlorothiazide  (HYZAAR ) 50-12.5 MG tablet, Take 1 tablet by mouth daily., Disp: 90 tablet, Rfl: 3   LUTEIN PO, Take 1 capsule by mouth daily., Disp: , Rfl:    Magnesium  200 MG TABS, Take 1 tablet by mouth daily at 2 PM., Disp: , Rfl:    metoprolol  succinate (TOPROL -XL) 50 MG 24 hr tablet, Take 1 tablet (50 mg total) by mouth daily. Take with or immediately following a meal., Disp: 90 tablet, Rfl: 3   Multiple Vitamin (MULTIVITAMIN WITH MINERALS) TABS tablet,  Take 1 tablet by mouth daily., Disp: 30 tablet, Rfl: 0   omeprazole (PRILOSEC) 20 MG capsule, Take 20 mg by mouth daily., Disp: , Rfl:    rosuvastatin  (CRESTOR ) 10 MG tablet, TAKE 1 TABLET BY MOUTH EVERY DAY (Patient taking differently: Take 10 mg by mouth at bedtime.), Disp: 90 tablet, Rfl: 3   traMADol (ULTRAM) 50 MG tablet, Take 50 mg by mouth 2 (two) times daily., Disp: , Rfl:    vitamin B-12 (CYANOCOBALAMIN ) 500 MCG tablet, Take 1 tablet (500 mcg total) by mouth daily., Disp: 30 tablet, Rfl: 0   Vitamin D, Ergocalciferol, (DRISDOL) 1.25 MG (50000 UNIT) CAPS capsule, 1 capsule Orally once weekly for 84 days, Disp: , Rfl:    Meds ordered this encounter  Medications   metoprolol  succinate (TOPROL -XL) 50 MG 24 hr tablet    Sig: Take 1 tablet (50 mg total) by mouth daily. Take with or immediately following a meal.    Dispense:  90 tablet    Refill:  3   losartan -hydrochlorothiazide  (HYZAAR ) 50-12.5 MG tablet    Sig: Take 1 tablet by mouth daily.    Dispense:  90 tablet    Refill:  3     Medications Discontinued During This Encounter  Medication Reason   metoprolol  succinate (TOPROL -XL) 25 MG 24 hr tablet Dose change   losartan -hydrochlorothiazide  (HYZAAR ) 100-12.5 MG tablet Dose change     ASSESSMENT AND PLAN: .      ICD-10-CM   1. 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 Primary hypertension  I10 metoprolol  succinate (TOPROL -XL) 50 MG 24 hr tablet    losartan -hydrochlorothiazide  (HYZAAR ) 50-12.5 MG tablet      Assessment and Plan Assessment & Plan Peripheral artery disease   He experiences worsening claudication symptoms in the left leg, with a history of bypass on one leg and the other untreated, with Doppler  suggesting long CTO. A lower extremity arterial duplex and ABI are ordered to assess circulation.  In view of his severe symptoms, patient preference, peripheral angiogram will be scheduled.   Will schedule for peripheral arteriogram and possible angioplasty given symptoms. Patient understands the risks, benefits, alternatives including medical therapy, CT angiography. Patient understands <1-2% risk of death, embolic complications, bleeding, infection, renal failure, urgent surgical revascularization, but not limited to these and wants to proceed.  Specifically reviewed renal complications.  He has had a low risk Lexiscan  nuclear stress test in 2023.  Patient has femoropopliteal bypass surgery on the right lower extremity, I could consider access slightly higher than the bypass surgical graft on the right or consider left antegrade access via femoral artery.  I will evaluate his duplex and also during procedure time we will do ultrasound-guided access.  Extrasystoles   There is an increased frequency of extrasystoles on EKG. Metoprolol  succinate will be increased to 50 mg daily to manage symptoms effectively.  Hypertension   His blood pressure is 100/70 mmHg. Adjustments to antihypertensive medication are necessary to prevent hypotension. Losartan  hydrochlorothiazide  will be reduced to half the current dose to manage blood pressure levels effectively.  Chronic kidney disease, stage 3   He has mild chronic kidney disease. Kidney function is considered when planning procedures involving contrast dye. Less contrast and x-ray dye will be used during the angiogram to minimize kidney impact.  You are also hold losartan  HCT 2 days prior and I have advised him to consume plenty of beets.  Data shows renal protection repeat. I will see him back after the procedure.  Patient's wife present and all questions answered.  Patient states that his quality of life has been affected by PAD and understands the risk of  procedure.  Signed,  Knox Perl, MD, [facility] 05/24/2023, 3:10 PM [facility] [address] [place] [address] [place], [place] [address] Phone: [phone]. Fax:  [phone]

## 2023-05-24 NOTE — Patient Instructions (Addendum)
 Medication Instructions:  Your physician recommends that you continue on your current medications as directed. Please refer to the Current Medication list given to you today.  *If you need a refill on your cardiac medications before your next appointment, please call your pharmacy*  Lab Work: If you have labs (blood work) drawn today and your tests are completely normal, you will receive your results only by: MyChart Message (if you have MyChart) OR A paper copy in the mail If you have any lab test that is abnormal or we need to change your treatment, we will call you to review the results.  Testing/Procedures: Your physician has requested that you have an ankle brachial index (ABI). During this test an ultrasound and blood pressure cuff are used to evaluate the arteries that supply the arms and legs with blood. Allow thirty minutes for this exam. There are no restrictions or special instructions.  Your physician has requested that you have a lower extremity arterial duplex. This test is an ultrasound of the arteries in the legs. It looks at arterial blood flow in the legs and arms. Allow one hour for Lower Arterial scans. There are no restrictions or special instructions.  Please note: We ask at that you not bring children with you during ultrasound (echo/ vascular) testing. Due to room size and safety concerns, children are not allowed in the ultrasound rooms during exams. Our front office staff cannot provide observation of children in our lobby area while testing is being conducted. An adult accompanying a patient to their appointment will only be allowed in the ultrasound room at the discretion of the ultrasound technician under special circumstances. We apologize for any inconvenience.   Follow-Up: At [facility], you and your health needs are our priority.  As part of our continuing mission to provide you with exceptional heart care, our providers are all part of one team.  This  team includes your primary Cardiologist (physician) and Advanced Practice Providers or APPs (Physician Assistants and Nurse Practitioners) who all work together to provide you with the care you need, when you need it.  Your next appointment:   1 year(s)  Provider:   Knox Perl, MD     We recommend signing up for the patient portal called "MyChart".  Sign up information is provided on this After Visit Summary.  MyChart is used to connect with patients for Virtual Visits (Telemedicine).  Patients are able to view lab/test results, encounter notes, upcoming appointments, etc.  Non-urgent messages can be sent to your provider as well.   To learn more about what you can do with MyChart, go to [email or web address].   Other Instructions Dr. Maida Sciara nurse will call you to schedule Lower Extremity Angiogram.      1st Floor: - Lobby - Registration  - Pharmacy  - Lab - Cafe  2nd Floor: - PV Lab - Diagnostic Testing (echo, CT, nuclear med)  3rd Floor: - Vacant  4th Floor: - TCTS (cardiothoracic surgery) - AFib Clinic - Structural Heart Clinic - Vascular Surgery  - Vascular Ultrasound  5th Floor: - HeartCare Cardiology (general and EP) - Clinical Pharmacy for coumadin, hypertension, lipid, weight-loss medications, and med management appointments    Valet parking services will be available as well.

## 2023-05-24 NOTE — Progress Notes (Unsigned)
 Cardiology Office Note:  .   Date:  05/24/2023  ID:  Lance Dillon, DOB 11-08-1940, MRN [id number] PCP: Lance Man, MD  [facility] Providers Cardiologist:  Lance Perl, MD { Click to update primary MD,subspecialty MD or APP then REFRESH:1}  History of Present Illness: .   Lance Dillon is a 83 y.o. 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    Past medical history significant for hypertension, hyperlipidemia, chronic stage IIIa creatinine disease, chronic anemia and remote heavy tobacco use disorder, quit smoking in 2004.  I am seeing him on an urgent basis as patient has noticed swelling in his legs and also dark spots on his left foot.  In view of known chronic critical limb ischemia involving the left leg, brought him in to be further evaluated.  Discussed the use of AI scribe software for clinical note transcription with the patient, who gave verbal consent to proceed.  History of Present Illness The patient, with a history of peripheral arterial disease, presents with leg pain, noticed a red swelling in his left leg and wanted to be seen. The discomfort is located in the calves and feet, with the left side being more severe. The pain is exacerbated by walking and using a stationary bike. The patient reports that the pain and swelling have not improved despite current medication regimen.  The patient denies any chest pain or shortness of breath.   Labs    Lab Results  Component Value Date   NA 135 02/04/2022   K 3.7 02/04/2022   CO2 24 02/04/2022    GLUCOSE 163 (H) 02/04/2022   BUN 31 (H) 02/04/2022   CREATININE 1.40 (H) 02/04/2022   CALCIUM  9.5 02/04/2022   EGFR 58 (L) 10/07/2021   GFRNONAA 50 (L) 02/04/2022      Latest Ref Rng & Units 02/04/2022    7:33 PM 10/07/2021    1:10 PM 12/29/2020    1:35 AM  BMP  Glucose 70 - 99 mg/dL 782  85  956   BUN 8 - 23 mg/dL 31  28  27    Creatinine 0.61 - 1.24 mg/dL 2.13  0.86  5.78   BUN/Creat Ratio 10 - 24  22    Sodium 135 - 145 mmol/L 135  141  133   Potassium 3.5 - 5.1 mmol/L 3.7  4.3  3.8   Chloride 98 - 111 mmol/L 98  98  101   CO2 22 - 32 mmol/L 24  25  27    Calcium  8.9 - 10.3 mg/dL 9.5  46.9  8.1      External Labs:  Care Everywhere PCP labs 08/18/2022:  Hb 14.1/HCT 42.5, 169.  Serum glucose 106 mg, BUN 29, creatinine 1.29, EGFR 56 mL, potassium 4.3.  Total cholesterol 127, triglycerides 171, HDL 48, LDL 51.  Review of Systems  Cardiovascular:  Positive for claudication (Bilateral, left worse than right). Negative for chest pain, dyspnea on exertion and leg swelling.  Skin:  Positive for suspicious lesions (left foot).   Physical Exam:   VS:  BP 100/62 (BP Location: Left Arm, Patient Position: Sitting, Cuff  Size: Normal)   Pulse 90   Ht 5\' 7"  (1.702 m)   Wt 161 lb (73 kg)   SpO2 97%   BMI 25.22 kg/m    Wt Readings from Last 3 Encounters:  05/24/23 161 lb (73 kg)  01/26/23 171 lb 6.4 oz (77.7 kg)  08/18/22 162 lb (73.5 kg)       05/24/2023    2:26 PM 01/26/2023    8:04 AM 08/18/2022    9:09 AM  Vitals with BMI  Height 5\' 7"  5\' 7"  5\' 7"   Weight 161 lbs 171 lbs 6 oz 162 lbs  BMI 25.21 26.84 25.37  Systolic 100 120 295  Diastolic 62 70 64  Pulse 90 68     Physical Exam Neck:     Vascular: No carotid bruit or JVD.  Cardiovascular:     Rate and Rhythm: Normal rate and regular rhythm.     Pulses: Intact distal pulses.          Femoral pulses are 2+ on the right side and 1+ on the left side.      Dorsalis pedis pulses are 1+ on the right side and 0 on the  left side.       Posterior tibial pulses are 0 on the right side and 0 on the left side.     Heart sounds: Normal heart sounds. No murmur heard.    No gallop.  Pulmonary:     Effort: Pulmonary effort is normal.     Breath sounds: Normal breath sounds.  Abdominal:     General: Bowel sounds are normal.     Palpations: Abdomen is soft.  Musculoskeletal:     Right lower leg: No edema.     Left lower leg: No edema.  Skin:    Capillary Refill: Capillary refill takes more than 3 seconds. Right leg capillary refill <2 seconds, left leg greater than 3 seconds.    Findings: No lesion.    Studies Reviewed: Aaron Aas    ABI 11/02/2021: This exam reveals normal perfusion of the right lower extremity (ABI 1.06).  This exam reveals moderately decreased perfusion of the left lower extremity, noted at the dorsalis pedis and post tibial artery level (ABI 0.5).  Mildly abnormal biphasic waveform at the right ankle and severely abnormal monophasic waveform at the left ankle.  Consider further work up.   Compared to external report, no change from 04/20/2021.  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  EKG:    EKG Interpretation Date/Time:  Thursday May 24 2023 14:23:58 EDT Ventricular Rate:  90 PR Interval:  188 QRS Duration:  76 QT Interval:  356 QTC Calculation: 435 R Axis:   -30  Text Interpretation: EKG 05/24/2023 at 1422 hrs.: Normal sinus rhythm at the rate of 88 bpm, incomplete right bundle branch block.  Single PVC.  Repeat EKG at 1423 hrs. no change, frequent PVCs (3 and 1 PAC noted. Confirmed by Ianna Salmela, Jagadeesh [phone]) on 05/24/2023 2:45:28 PM   EKG 1 01/26/2023: Normal sinus rhythm at rate of 68 bpm, normal EKG. Compared to 02/04/2022, sinus tachycardia not present.   Medications and allergies    Allergies  Allergen Reactions   Avelox [Moxifloxacin Hcl In Nacl] Diarrhea   Moxifloxacin Diarrhea and Other (See Comments)   Cilostazol  Diarrhea   Cyclobenzaprine Other (See  Comments)   Quinolones     Other reaction(s): Unknown   Ramipril Other (See Comments)    Other reaction(s): Unknown    Current Outpatient Medications:  acetaminophen  (TYLENOL ) 500 MG tablet, Take 500 mg by mouth every 6 (six) hours as needed for mild pain or headache., Disp: , Rfl:    amLODipine  (NORVASC ) 2.5 MG tablet, Take 1 tablet (2.5 mg total) by mouth at bedtime., Disp: 90 tablet, Rfl: 3   aspirin  (ASPIRIN  CHILDRENS) 81 MG chewable tablet, Chew 1 tablet (81 mg total) by mouth daily., Disp: , Rfl:    docusate sodium  (COLACE) 100 MG capsule, Take 1 capsule (100 mg total) by mouth daily as needed for mild constipation., Disp: , Rfl: 0   fluticasone  (FLONASE ) 50 MCG/ACT nasal spray, Place 2 sprays into both nostrils daily as needed for rhinitis or allergies (or nasal congestion)., Disp: , Rfl:    latanoprost  (XALATAN ) 0.005 % ophthalmic solution, Place 1 drop into the left eye at bedtime. , Disp: , Rfl:    loratadine (CLARITIN) 10 MG tablet, 1 tablet Orally Once a day as needed, Disp: , Rfl:    losartan -hydrochlorothiazide  (HYZAAR ) 50-12.5 MG tablet, Take 1 tablet by mouth daily., Disp: 90 tablet, Rfl: 3   LUTEIN PO, Take 1 capsule by mouth daily., Disp: , Rfl:    Magnesium  200 MG TABS, Take 1 tablet by mouth daily at 2 PM., Disp: , Rfl:    metoprolol  succinate (TOPROL -XL) 50 MG 24 hr tablet, Take 1 tablet (50 mg total) by mouth daily. Take with or immediately following a meal., Disp: 90 tablet, Rfl: 3   Multiple Vitamin (MULTIVITAMIN WITH MINERALS) TABS tablet, Take 1 tablet by mouth daily., Disp: 30 tablet, Rfl: 0   omeprazole (PRILOSEC) 20 MG capsule, Take 20 mg by mouth daily., Disp: , Rfl:    rosuvastatin  (CRESTOR ) 10 MG tablet, TAKE 1 TABLET BY MOUTH EVERY DAY (Patient taking differently: Take 10 mg by mouth at bedtime.), Disp: 90 tablet, Rfl: 3   traMADol (ULTRAM) 50 MG tablet, Take 50 mg by mouth 2 (two) times daily., Disp: , Rfl:    vitamin B-12 (CYANOCOBALAMIN ) 500 MCG tablet,  Take 1 tablet (500 mcg total) by mouth daily., Disp: 30 tablet, Rfl: 0   Vitamin D, Ergocalciferol, (DRISDOL) 1.25 MG (50000 UNIT) CAPS capsule, 1 capsule Orally once weekly for 84 days, Disp: , Rfl:    Meds ordered this encounter  Medications   metoprolol  succinate (TOPROL -XL) 50 MG 24 hr tablet    Sig: Take 1 tablet (50 mg total) by mouth daily. Take with or immediately following a meal.    Dispense:  90 tablet    Refill:  3   losartan -hydrochlorothiazide  (HYZAAR ) 50-12.5 MG tablet    Sig: Take 1 tablet by mouth daily.    Dispense:  90 tablet    Refill:  3     Medications Discontinued During This Encounter  Medication Reason   metoprolol  succinate (TOPROL -XL) 25 MG 24 hr tablet Dose change   losartan -hydrochlorothiazide  (HYZAAR ) 100-12.5 MG tablet Dose change     ASSESSMENT AND PLAN: .      ICD-10-CM   1. 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 Primary hypertension  I10 metoprolol  succinate (TOPROL -XL) 50 MG 24 hr tablet    losartan -hydrochlorothiazide  (HYZAAR ) 50-12.5 MG tablet      Assessment and Plan Assessment &  Plan Peripheral artery disease   He experiences worsening claudication symptoms in the left leg, with a history of bypass on one leg and the other untreated, suggesting potential arterial blockage. A lower extremity arterial duplex and ABI are ordered to assess circulation. If symptoms persist, a peripheral angiogram will be scheduled. The angiogram carries risks such as bleeding, infection, and blood clot formation, with less than 1% risk of severe complications like death or emergency surgery. He should hold losartan  two days before the angiogram and consume beets two days prior to protect his  kidneys.  Extrasystoles   There is an increased frequency of extrasystoles on EKG. Metoprolol  will be increased to 50 mg daily to manage symptoms effectively.  Hypertension   His blood pressure is 100/70 mmHg. Adjustments to antihypertensive medication are necessary to prevent hypotension. Losartan  hydrochlorothiazide  will be reduced to half the current dose to manage blood pressure levels effectively.  Chronic kidney disease, stage 3   He has mild chronic kidney disease. Kidney function is considered when planning procedures involving contrast dye. Less contrast and x-ray dye will be used during the angiogram to minimize kidney impact.      1. Atherosclerosis of native artery of both lower extremities with intermittent claudication (HCC) ***  2. Primary hypertension ***  3. Stage 3b chronic kidney disease (HCC) ***  4. Hyperlipidemia LDL goal <70 ***   Signed,  Lance Perl, MD, [facility] 05/24/2023, 3:10 PM [facility] [address] [place], [place] [address] Phone: [phone]. Fax:  [phone]

## 2023-05-25 LAB — CBC
Hematocrit: 41.8 % (ref 37.5–51.0)
Hemoglobin: 14.2 g/dL (ref 13.0–17.7)
MCH: 32.9 pg (ref 26.6–33.0)
MCHC: 34 g/dL (ref 31.5–35.7)
MCV: 97 fL (ref 79–97)
Platelets: 207 10*3/uL (ref 150–450)
RBC: 4.31 x10E6/uL (ref 4.14–5.80)
RDW: 12.4 % (ref 11.6–15.4)
WBC: 6 10*3/uL (ref 3.4–10.8)

## 2023-05-25 LAB — BASIC METABOLIC PANEL WITH GFR
BUN/Creatinine Ratio: 26 — ABNORMAL HIGH (ref 10–24)
BUN: 35 mg/dL — ABNORMAL HIGH (ref 8–27)
CO2: 23 mmol/L (ref 20–29)
Calcium: 10.3 mg/dL — ABNORMAL HIGH (ref 8.6–10.2)
Chloride: 100 mmol/L (ref 96–106)
Creatinine, Ser: 1.36 mg/dL — ABNORMAL HIGH (ref 0.76–1.27)
Glucose: 103 mg/dL — ABNORMAL HIGH (ref 70–99)
Potassium: 4.2 mmol/L (ref 3.5–5.2)
Sodium: 140 mmol/L (ref 134–144)
eGFR: 52 mL/min/{1.73_m2} — ABNORMAL LOW (ref >59)

## 2023-06-01 ENCOUNTER — Other Ambulatory Visit: Payer: Self-pay | Admitting: *Deleted

## 2023-06-01 ENCOUNTER — Encounter: Payer: Self-pay | Admitting: *Deleted

## 2023-06-01 ENCOUNTER — Telehealth: Payer: Self-pay | Admitting: *Deleted

## 2023-06-01 DIAGNOSIS — I739 Peripheral vascular disease, unspecified: Secondary | ICD-10-CM

## 2023-06-01 DIAGNOSIS — Z01812 Encounter for preprocedural laboratory examination: Secondary | ICD-10-CM

## 2023-06-01 DIAGNOSIS — I10 Essential (primary) hypertension: Secondary | ICD-10-CM

## 2023-06-01 DIAGNOSIS — I70213 Atherosclerosis of native arteries of extremities with intermittent claudication, bilateral legs: Secondary | ICD-10-CM

## 2023-06-01 NOTE — Telephone Encounter (Signed)
 I spoke with cath lab and PV procedure cannot be done on May 15.  Can be done on May 8 at 1:00.  Will check with Dr Berry Bristol to see if May 8th is OK.  I spoke with patient's wife and gave her update.  Patient is able to be at hospital on May 8 if procedure can be done that day.  He will have ABI as scheduled on May 6

## 2023-06-01 NOTE — Telephone Encounter (Signed)
 Reviewed with Dr Berry Bristol and procedure can be done on May 8.  Procedure scheduled for 1 PM.  Patient's wife made aware.  I verbally went over instructions with her and will send copy through my chart.

## 2023-06-01 NOTE — Telephone Encounter (Signed)
 PV angiogram can be done on May 15 in the afternoon. Will need PV study prior to this.  Doppler study currently scheduled for May 21.  Reviewed with Dr Berry Bristol and he would like study done prior to May 15 if possible.  If unable to do doppler prior can just do ABI.  If not able to do prior to May 15 will look to see what other days are available for PV angiogram

## 2023-06-01 NOTE — Telephone Encounter (Signed)
 ABIs can be done on May 6.  I spoke with patient and his wife and they would like to proceed with ABI on May 6 and PV angiogram on May 15

## 2023-06-05 ENCOUNTER — Ambulatory Visit (HOSPITAL_COMMUNITY)
Admission: RE | Admit: 2023-06-05 | Discharge: 2023-06-05 | Disposition: A | Discharge status: Home / Self Care | Source: Ambulatory Visit | Attending: Cardiology | Admitting: Cardiology

## 2023-06-05 DIAGNOSIS — I70213 Atherosclerosis of native arteries of extremities with intermittent claudication, bilateral legs: Secondary | ICD-10-CM | POA: Insufficient documentation

## 2023-06-05 NOTE — Progress Notes (Signed)
 ABI 06/05/2023: Right ABI 1.17 with triphasic waveforms.  Right ABI within normal limits. Left ABI 0.45 indicates severe lower extremity arterial disease.  There is severely abnormal dampened monophasic waveform.

## 2023-06-06 ENCOUNTER — Telehealth: Payer: Self-pay | Admitting: *Deleted

## 2023-06-06 ENCOUNTER — Encounter: Payer: Self-pay | Admitting: Cardiology

## 2023-06-06 LAB — VAS US ABI WITH/WO TBI
Left ABI: 0.45
Right ABI: 1.17

## 2023-06-06 LAB — BASIC METABOLIC PANEL WITH GFR
BUN/Creatinine Ratio: 19 (ref 10–24)
BUN: 26 mg/dL (ref 8–27)
CO2: 24 mmol/L (ref 20–29)
Calcium: 9.5 mg/dL (ref 8.6–10.2)
Chloride: 100 mmol/L (ref 96–106)
Creatinine, Ser: 1.4 mg/dL — ABNORMAL HIGH (ref 0.76–1.27)
Glucose: 134 mg/dL — ABNORMAL HIGH (ref 70–99)
Potassium: 4.6 mmol/L (ref 3.5–5.2)
Sodium: 140 mmol/L (ref 134–144)
eGFR: 50 mL/min/{1.73_m2} — ABNORMAL LOW (ref >59)

## 2023-06-06 LAB — CBC
Hematocrit: 42.6 % (ref 37.5–51.0)
Hemoglobin: 14.1 g/dL (ref 13.0–17.7)
MCH: 32.3 pg (ref 26.6–33.0)
MCHC: 33.1 g/dL (ref 31.5–35.7)
MCV: 98 fL — ABNORMAL HIGH (ref 79–97)
Platelets: 179 10*3/uL (ref 150–450)
RBC: 4.36 x10E6/uL (ref 4.14–5.80)
RDW: 12.5 % (ref 11.6–15.4)
WBC: 5.4 10*3/uL (ref 3.4–10.8)

## 2023-06-06 NOTE — Telephone Encounter (Signed)
 LEA  scheduled at [facility] for: Thursday Jun 07, 2023 1 PM Arrival time [facility] Main Entrance A at: 11 AM  Nothing to eat after midnight prior to procedure, clear liquids until 5 AM day of procedure.  Medication instructions: -Hold:  Losartan /hydrochlorothiazide -day before and day of procedure per protocol GFR < 60 (50) -Other usual morning medications can be taken with sips of water including aspirin  81 mg.  Plan to go home the same day, you will only stay overnight if medically necessary.  You must have responsible adult to drive you home.  Someone must be with you the first 24 hours after you arrive home.  Reviewed procedure instructions with patient.

## 2023-06-06 NOTE — Progress Notes (Signed)
 Labs are stable for PV angio

## 2023-06-07 ENCOUNTER — Ambulatory Visit (HOSPITAL_COMMUNITY)
Admission: RE | Admit: 2023-06-07 | Discharge: 2023-06-07 | Disposition: A | Discharge status: Home / Self Care | Attending: Cardiology | Admitting: Cardiology

## 2023-06-07 ENCOUNTER — Encounter (HOSPITAL_COMMUNITY)
Admission: RE | Disposition: A | Discharge status: Home / Self Care | Payer: Self-pay | Source: Home / Self Care | Attending: Cardiology

## 2023-06-07 ENCOUNTER — Encounter (HOSPITAL_COMMUNITY): Payer: Self-pay | Admitting: Cardiology

## 2023-06-07 ENCOUNTER — Other Ambulatory Visit: Payer: Self-pay

## 2023-06-07 DIAGNOSIS — I739 Peripheral vascular disease, unspecified: Secondary | ICD-10-CM | POA: Diagnosis not present

## 2023-06-07 DIAGNOSIS — I6523 Occlusion and stenosis of bilateral carotid arteries: Secondary | ICD-10-CM | POA: Insufficient documentation

## 2023-06-07 DIAGNOSIS — Z79899 Other long term (current) drug therapy: Secondary | ICD-10-CM | POA: Diagnosis not present

## 2023-06-07 DIAGNOSIS — I4949 Other premature depolarization: Secondary | ICD-10-CM | POA: Diagnosis not present

## 2023-06-07 DIAGNOSIS — I493 Ventricular premature depolarization: Secondary | ICD-10-CM | POA: Diagnosis not present

## 2023-06-07 DIAGNOSIS — I129 Hypertensive chronic kidney disease with stage 1 through stage 4 chronic kidney disease, or unspecified chronic kidney disease: Secondary | ICD-10-CM | POA: Insufficient documentation

## 2023-06-07 DIAGNOSIS — N183 Chronic kidney disease, stage 3 unspecified: Secondary | ICD-10-CM | POA: Insufficient documentation

## 2023-06-07 DIAGNOSIS — I7092 Chronic total occlusion of artery of the extremities: Secondary | ICD-10-CM | POA: Insufficient documentation

## 2023-06-07 DIAGNOSIS — I70213 Atherosclerosis of native arteries of extremities with intermittent claudication, bilateral legs: Secondary | ICD-10-CM | POA: Insufficient documentation

## 2023-06-07 HISTORY — DX: Personal history of Methicillin resistant Staphylococcus aureus infection: Z86.14

## 2023-06-07 HISTORY — PX: LOWER EXTREMITY ANGIOGRAPHY: CATH118251

## 2023-06-07 SURGERY — LOWER EXTREMITY ANGIOGRAPHY
Anesthesia: LOCAL | Laterality: Bilateral

## 2023-06-07 MED ORDER — FENTANYL CITRATE (PF) 100 MCG/2ML IJ SOLN
INTRAMUSCULAR | Status: DC | PRN
  Administered 2023-06-07: 50 ug via INTRAVENOUS

## 2023-06-07 MED ORDER — ONDANSETRON HCL 4 MG/2ML IJ SOLN: 4.0000 mg | Freq: Four times a day (QID) | INTRAMUSCULAR | Status: DC | PRN

## 2023-06-07 MED ORDER — ACETAMINOPHEN 325 MG PO TABS: 650.0000 mg | ORAL_TABLET | ORAL | Status: DC | PRN

## 2023-06-07 MED ORDER — MIDAZOLAM HCL 2 MG/2ML IJ SOLN
INTRAMUSCULAR | Status: AC
  Filled 2023-06-07: qty 2

## 2023-06-07 MED ORDER — SODIUM CHLORIDE 0.9 % WEIGHT BASED INFUSION
1.0000 mL/kg/h | INTRAVENOUS | Status: DC
Start: 2023-06-07 — End: 2023-06-07

## 2023-06-07 MED ORDER — FENTANYL CITRATE (PF) 100 MCG/2ML IJ SOLN
INTRAMUSCULAR | Status: AC
  Filled 2023-06-07: qty 2

## 2023-06-07 MED ORDER — LIDOCAINE HCL (PF) 1 % IJ SOLN
INTRAMUSCULAR | Status: AC
  Filled 2023-06-07: qty 30

## 2023-06-07 MED ORDER — HEPARIN (PORCINE) IN NACL 2000-0.9 UNIT/L-% IV SOLN
INTRAVENOUS | Status: DC | PRN
  Administered 2023-06-07: 1000 mL

## 2023-06-07 MED ORDER — SODIUM CHLORIDE 0.9% FLUSH: 3.0000 mL | INTRAVENOUS | Status: DC | PRN

## 2023-06-07 MED ORDER — IODIXANOL 320 MG/ML IV SOLN
INTRAVENOUS | Status: DC | PRN
Start: 2023-06-07 — End: 2023-06-07
  Administered 2023-06-07: 38 mL

## 2023-06-07 MED ORDER — LIDOCAINE HCL (PF) 1 % IJ SOLN
INTRAMUSCULAR | Status: DC | PRN
  Administered 2023-06-07: 15 mL

## 2023-06-07 MED ORDER — ASPIRIN 81 MG PO CHEW: 81.0000 mg | CHEWABLE_TABLET | ORAL | Status: DC

## 2023-06-07 MED ORDER — SODIUM CHLORIDE 0.9 % IV SOLN: 250.0000 mL | INTRAVENOUS | Status: DC | PRN

## 2023-06-07 MED ORDER — MIDAZOLAM HCL 2 MG/2ML IJ SOLN
INTRAMUSCULAR | Status: DC | PRN
  Administered 2023-06-07: 2 mg via INTRAVENOUS

## 2023-06-07 MED ORDER — SODIUM CHLORIDE 0.9 % WEIGHT BASED INFUSION: 3.0000 mL/kg/h | INTRAVENOUS | Status: AC

## 2023-06-07 SURGICAL SUPPLY — 13 items
CATH OMNI FLUSH 5F 65CM (CATHETERS) IMPLANT
CLOSURE MYNX CONTROL 5F (Vascular Products) IMPLANT
COVER DOME SNAP 22 D (MISCELLANEOUS) IMPLANT
GUIDEWIRE NITREX 0.018X80X5 (WIRE) IMPLANT
KIT MICROPUNCTURE NIT STIFF (SHEATH) IMPLANT
KIT PV (KITS) ×1 IMPLANT
SET ATX-X65L (MISCELLANEOUS) IMPLANT
SHEATH PINNACLE 5F 10CM (SHEATH) IMPLANT
SHEATH PROBE COVER 6X72 (BAG) IMPLANT
SYR MEDRAD MARK 7 150ML (SYRINGE) ×1 IMPLANT
TRANSDUCER W/STOPCOCK (MISCELLANEOUS) ×1 IMPLANT
TRAY PV CATH (CUSTOM PROCEDURE TRAY) ×1 IMPLANT
WIRE BENTSON .035X145CM (WIRE) IMPLANT

## 2023-06-07 NOTE — Progress Notes (Signed)
 I have reviewed the angiograms with Dr. 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  Since he is a claudicant and there is no open wounds, we plan on proceeding with profundoplasty/patch angioplasty and see how he does.  Patient willing to proceed with surgery, he has had a low risk nuclear stress test in 2023, from cardiac standpoint he can be taken up for the surgery with low risk.  After discussions, patient agrees to directly be scheduled for left profundoplasty and patch angioplasty and will meet Dr. Angela Kell during hospitalization.  All questions have been answered.  He is stable from medical standpoint as well.     Knox Perl, MD, [facility] 06/07/2023, 12:16 PM [facility] [address] [place], [place] [address] Phone: [phone]. Fax:  [phone]

## 2023-06-07 NOTE — Discharge Instructions (Signed)
 Femoral Site Care This sheet gives you information about how to care for yourself after your procedure. Your health care provider may also give you more specific instructions. If you have problems or questions, contact your health care provider. What can I expect after the procedure?  After the procedure, it is common to have: Bruising that usually fades within 1-2 weeks. Tenderness at the site. Follow these instructions at home: Wound care Follow instructions from your health care provider about how to take care of your insertion site. Make sure you: Wash your hands with soap and water before you change your bandage (dressing). If soap and water are not available, use hand sanitizer. Remove your dressing as told by your health care provider. In 24 hours Do not take baths, swim, or use a hot tub until your health care provider approves. For 5 days You may shower 24 hours after the procedure or as told by your health care provider. Gently wash the site with plain soap and water. Pat the area dry with a clean towel. Do not rub the site. This may cause bleeding. Do not apply powder or lotion to the site. Keep the site clean and dry. Check your femoral site every day for signs of infection. Check for: Redness, swelling, or pain. Fluid or blood. Warmth. Pus or a bad smell. Activity For the first 2-3 days after your procedure, or as long as directed: Avoid climbing stairs as much as possible. Do not squat. Do not lift anything that is heavier than 10 lb (4.5 kg), or the limit that you are told, until your health care provider says that it is safe. For 5 days Rest as directed. Avoid sitting for a long time without moving. Get up to take short walks every 1-2 hours. Do not drive for 24 hours if you were given a medicine to help you relax (sedative). General instructions Take over-the-counter and prescription medicines only as told by your health care provider. Keep all follow-up visits as  told by your health care provider. This is important. Contact a health care provider if you have: A fever or chills. You have redness, swelling, or pain around your insertion site. Get help right away if: The catheter insertion area swells very fast. You pass out. You suddenly start to sweat or your skin gets clammy. The catheter insertion area is bleeding, and the bleeding does not stop when you hold steady pressure on the area. The area near or just beyond the catheter insertion site becomes pale, cool, tingly, or numb. These symptoms may represent a serious problem that is an emergency. Do not wait to see if the symptoms will go away. Get medical help right away. Call your local emergency services (911 in the [place] Do not drive yourself to the hospital. Summary After the procedure, it is common to have bruising that usually fades within 1-2 weeks. Check your femoral site every day for signs of infection. Do not lift anything that is heavier than 10 lb (4.5 kg), or the limit that you are told, until your health care provider says that it is safe. This information is not intended to replace advice given to you by your health care provider. Make sure you discuss any questions you have with your health care provider. Document Revised: 01/29/2017 Document Reviewed: 01/29/2017 Elsevier Patient Education  2020 [employer].

## 2023-06-07 NOTE — Interval H&P Note (Signed)
 History and Physical Interval Note:  06/07/2023 11:00 AM  Lance Dillon  has presented today for surgery, with the diagnosis of PAD.  The various methods of treatment have been discussed with the patient and family. After consideration of risks, benefits and other options for treatment, the patient has consented to  Procedure(s): Lower Extremity Angiography (Bilateral) and possible angioplasty as a surgical intervention.  The patient's history has been reviewed, patient examined, no change in status, stable for surgery.  I have reviewed the patient's chart and labs.  Questions were answered to the patient's satisfaction.     Lance Dillon

## 2023-06-08 ENCOUNTER — Other Ambulatory Visit: Payer: Self-pay

## 2023-06-08 ENCOUNTER — Encounter (HOSPITAL_COMMUNITY): Payer: Self-pay | Admitting: Cardiology

## 2023-06-08 DIAGNOSIS — I70213 Atherosclerosis of native arteries of extremities with intermittent claudication, bilateral legs: Secondary | ICD-10-CM

## 2023-06-08 NOTE — Progress Notes (Signed)
 SDW call  Patient was given pre-op instructions over the phone. Patient verbalized understanding of instructions provided.     PCP - Dr. Imelda Man Cardiologist - Dr. Knox Perl Pulmonary:    PPM/ICD - denies Device Orders - na Rep Notified - na   Chest x-ray - 02/04/2022 EKG -  05/24/2023 Stress Test - 04/05/2021 ECHO - 108/2024 Cardiac Cath -   Sleep Study/sleep apnea/CPAP: denies  Non-diabetic  Blood Thinner Instructions: denies Aspirin  Instructions:continue   ERAS Protcol - NPO   Anesthesia review: Yes. HTN, stroke, HLD, PVD, CPD, CKD   Patient denies shortness of breath, fever, cough and chest pain over the phone call  Your procedure is scheduled on Tuesday Jun 12, 2023  Report to [facility] Main Entrance "A" at  0530  A.M., then check in with the Admitting office.  Call this number if you have problems the morning of surgery:  [phone]   If you have any questions prior to your surgery date call [phone]: Open Monday-Friday 8am-4pm If you experience any cold or flu symptoms such as cough, fever, chills, shortness of breath, etc. between now and your scheduled surgery, please notify us  at the above number    Remember:  Do not eat or drink after midnight the night before your surgery  Take these medicines if needed the morning  of surgery with A SIP OF WATER:  Tylenol , flonase , prilosec  As of today, STOP taking any Aleve, Naproxen, Ibuprofen, Motrin, Advil, Goody's, BC's, all herbal medications, fish oil , and all vitamins.

## 2023-06-11 NOTE — Anesthesia Preprocedure Evaluation (Addendum)
 Anesthesia Evaluation  Patient identified by MRN, date of birth, ID band Patient awake    Reviewed: Allergy & Precautions, NPO status , Patient's Chart, lab work & pertinent test results, reviewed documented beta blocker date and time   History of Anesthesia Complications Negative for: history of anesthetic complications  Airway Mallampati: II  TM Distance: >3 FB Neck ROM: Full    Dental  (+) Edentulous Upper, Poor Dentition, Dental Advisory Given   Pulmonary COPD, former smoker   breath sounds clear to auscultation       Cardiovascular hypertension, Pt. on medications and Pt. 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, cerebral aneurysm CVA (weak L leg), Residual Symptoms    GI/Hepatic Neg liver ROS,GERD  Medicated and Controlled,,  Endo/Other  negative endocrine ROS    Renal/GU Renal InsufficiencyRenal diseasestones     Musculoskeletal   Abdominal   Peds  Hematology Hb 14.1, plt 179k   Anesthesia Other Findings Prostate cancer  Reproductive/Obstetrics                              Anesthesia Physical Anesthesia Plan  ASA: 3  Anesthesia Plan: General   Post-op Pain Management: Tylenol  PO (pre-op)*   Induction: Intravenous  PONV Risk Score and Plan: 2  Airway Management Planned: Oral ETT  Additional Equipment: Arterial line  Intra-op Plan:   Post-operative Plan: Extubation in OR  Informed Consent: I have reviewed the patients History and Physical, chart, labs and discussed the procedure including the risks, benefits and alternatives for the proposed anesthesia with the patient or authorized representative who has indicated his/her  understanding and acceptance.     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  Recently had angiogram performed by Dr. Berry Bristol.  Per note 06/07/2023, "I have reviewed the angiograms with Dr. 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 Since he is a claudicant and there is no open wounds, we plan on proceeding with profundoplasty/patch angioplasty and see how he does. Patient willing to proceed with surgery, he has had a low risk nuclear stress test in 2023, from cardiac standpoint he can be taken up for the surgery with low risk. After discussions, patient agrees to directly be scheduled for left profundoplasty and patch angioplasty and will meet Dr. Angela Kell during hospitalization.  All questions have been answered.  He is stable from medical standpoint as well."  BMP and CBC 06/05/2023 reviewed, creatinine mildly elevated 1.40 consistent with history of CKD, otherwise unremarkable.  Patient will need day of surgery evaluation.  EKG 05/24/2023: Sinus rhythm.  Rate 90.  Incomplete right bundle branch block.  Frequent PVCs.  MRA head neck 11/29/2022: IMPRESSION: 1. Slight interval growth of the fusiform aneurysm extending from the basilar tip into the right posterior communicating artery. The aneurysm  now measures 11.2 x 13.8 x 25.9 mm. 2. Bilateral ICA occlusion with reconstitution of the right ICA terminus from the aneurysmal posterior communicating artery. 3. Decreased intensity of flow in the A1 and M1 segments of the right ACA and MCA suggesting decreased perfusion. 4. Moderate attenuation throughout the proximal right MCA branch vessels. 5. Reconstitution of the left ICA from external branches with cavernous transformation in the petrous segment.   TTE 11/07/2022: 1. Left ventricular ejection fraction, by estimation, is 60 to 65%. The  left ventricle has normal function. Left ventricular endocardial border  not optimally defined to evaluate regional wall motion. There is moderate  left ventricular hypertrophy.  Indeterminate due to MAC.   2. Right ventricular systolic function is normal. The right ventricular  size is normal. Mildly increased right ventricular wall thickness. There  is normal pulmonary artery systolic pressure.   3. Left atrial size was mildly dilated.   4. The mitral valve is abnormal. Trivial mitral valve regurgitation. No  evidence of mitral stenosis. Moderate to severe mitral annular  calcification.   5. The aortic valve is calcified. There is moderate calcification of the  aortic valve. There is moderate thickening of the aortic valve. Aortic  valve regurgitation is moderate. Aortic valve sclerosis is present, with  no evidence of aortic valve  stenosis.   6. Aortic dilatation noted. There is mild dilatation of the ascending  aorta, measuring 43 mm.   Comparison(s): No significant change from prior.   Lexiscan  Nuclear stress test 04/05/2021: Nondiagnostic ECG stress. The heart rate response was consistent with Lexiscan .  There is mild motion artifact in the inferior wall. Otherwise myocardial perfusion is normal. Overall LV systolic function is normal without regional wall motion abnormalities. Stress LV EF: 79%. Small volume LV.  No previous exam  available for comparison. Low risk.    )         Anesthesia Quick Evaluation

## 2023-06-11 NOTE — Progress Notes (Signed)
 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  Recently had angiogram performed by Dr. Berry Bristol.  Per note 06/07/2023, "I have reviewed the angiograms with Dr. 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 Since he is a claudicant and there is no open wounds, we plan on proceeding with profundoplasty/patch angioplasty and see how he does. Patient willing to proceed with surgery, he has had a low risk nuclear stress test in 2023, from cardiac standpoint he can be taken up for the surgery with low risk. After discussions, patient agrees to directly be scheduled for left profundoplasty and patch angioplasty and will meet Dr. Angela Kell during hospitalization.  All questions have been answered.  He is stable from medical standpoint as well."  BMP and CBC 06/05/2023 reviewed, creatinine mildly elevated 1.40 consistent with history of CKD, otherwise unremarkable.  Patient will need day of surgery evaluation.  EKG 05/24/2023: Sinus rhythm.  Rate 90.  Incomplete right bundle branch block.  Frequent PVCs.  MRA head neck 11/29/2022: IMPRESSION: 1. Slight interval growth of the fusiform aneurysm extending from the basilar tip into the right posterior communicating artery. The aneurysm now measures 11.2 x 13.8 x 25.9 mm. 2. Bilateral ICA occlusion with reconstitution of the right ICA terminus from the  aneurysmal posterior communicating artery. 3. Decreased intensity of flow in the A1 and M1 segments of the right ACA and MCA suggesting decreased perfusion. 4. Moderate attenuation throughout the proximal right MCA branch vessels. 5. Reconstitution of the left ICA from external branches with cavernous transformation in the petrous segment.   TTE 11/07/2022: 1. Left ventricular ejection fraction, by estimation, is 60 to 65%. The  left ventricle has normal function. Left ventricular endocardial border  not optimally defined to evaluate regional wall motion. There is moderate  left ventricular hypertrophy.  Indeterminate due to MAC.   2. Right ventricular systolic function is normal. The right ventricular  size is normal. Mildly increased right ventricular wall thickness. There  is normal pulmonary artery systolic pressure.   3. Left atrial size was mildly dilated.   4. The mitral valve is abnormal. Trivial mitral valve regurgitation. No  evidence of mitral stenosis. Moderate to severe mitral annular  calcification.   5. The aortic valve is calcified. There is moderate calcification of the  aortic valve. There is moderate thickening of the aortic valve. Aortic  valve regurgitation is moderate. Aortic valve sclerosis is present, with  no evidence of aortic valve  stenosis.   6. Aortic dilatation noted. There is mild dilatation of the ascending  aorta, measuring 43 mm.   Comparison(s): No significant change from prior.   Lexiscan  Nuclear stress test 04/05/2021: Nondiagnostic ECG stress. The heart rate response was consistent with Lexiscan .  There is mild motion artifact in the inferior wall. Otherwise myocardial perfusion is normal. Overall LV systolic function is normal without regional wall motion abnormalities. Stress LV EF: 79%. Small volume LV.  No previous exam available for comparison.  Low risk.      Edilia Gordon [facility] Short Stay Center/Anesthesiology Phone [phone] 06/11/2023 11:50 AM

## 2023-06-12 ENCOUNTER — Inpatient Hospital Stay (HOSPITAL_COMMUNITY): Payer: Self-pay | Admitting: Physician Assistant

## 2023-06-12 ENCOUNTER — Inpatient Hospital Stay (HOSPITAL_COMMUNITY)
Admission: RE | Admit: 2023-06-12 | Discharge: 2023-06-14 | DRG: 254 | Disposition: A | Discharge status: Home / Self Care | Attending: Vascular Surgery | Admitting: Vascular Surgery

## 2023-06-12 ENCOUNTER — Encounter (HOSPITAL_COMMUNITY): Payer: Self-pay | Admitting: Vascular Surgery

## 2023-06-12 ENCOUNTER — Other Ambulatory Visit: Payer: Self-pay

## 2023-06-12 ENCOUNTER — Encounter (HOSPITAL_COMMUNITY)
Admission: RE | Disposition: A | Discharge status: Home / Self Care | Payer: Self-pay | Source: Home / Self Care | Attending: Vascular Surgery

## 2023-06-12 DIAGNOSIS — Z888 Allergy status to other drugs, medicaments and biological substances status: Secondary | ICD-10-CM

## 2023-06-12 DIAGNOSIS — Z860100 Personal history of colon polyps, unspecified: Secondary | ICD-10-CM

## 2023-06-12 DIAGNOSIS — Z8249 Family history of ischemic heart disease and other diseases of the circulatory system: Secondary | ICD-10-CM | POA: Diagnosis not present

## 2023-06-12 DIAGNOSIS — I701 Atherosclerosis of renal artery: Secondary | ICD-10-CM | POA: Diagnosis present

## 2023-06-12 DIAGNOSIS — N1831 Chronic kidney disease, stage 3a: Secondary | ICD-10-CM | POA: Diagnosis present

## 2023-06-12 DIAGNOSIS — K219 Gastro-esophageal reflux disease without esophagitis: Secondary | ICD-10-CM | POA: Diagnosis not present

## 2023-06-12 DIAGNOSIS — H3530 Unspecified macular degeneration: Secondary | ICD-10-CM | POA: Diagnosis not present

## 2023-06-12 DIAGNOSIS — H5461 Unqualified visual loss, right eye, normal vision left eye: Secondary | ICD-10-CM | POA: Diagnosis present

## 2023-06-12 DIAGNOSIS — I6521 Occlusion and stenosis of right carotid artery: Secondary | ICD-10-CM | POA: Diagnosis present

## 2023-06-12 DIAGNOSIS — I70213 Atherosclerosis of native arteries of extremities with intermittent claudication, bilateral legs: Secondary | ICD-10-CM

## 2023-06-12 DIAGNOSIS — Z8614 Personal history of Methicillin resistant Staphylococcus aureus infection: Secondary | ICD-10-CM

## 2023-06-12 DIAGNOSIS — J449 Chronic obstructive pulmonary disease, unspecified: Secondary | ICD-10-CM | POA: Diagnosis present

## 2023-06-12 DIAGNOSIS — Z8042 Family history of malignant neoplasm of prostate: Secondary | ICD-10-CM

## 2023-06-12 DIAGNOSIS — E7800 Pure hypercholesterolemia, unspecified: Secondary | ICD-10-CM | POA: Diagnosis present

## 2023-06-12 DIAGNOSIS — Z87891 Personal history of nicotine dependence: Secondary | ICD-10-CM

## 2023-06-12 DIAGNOSIS — N182 Chronic kidney disease, stage 2 (mild): Secondary | ICD-10-CM | POA: Diagnosis not present

## 2023-06-12 DIAGNOSIS — Z8546 Personal history of malignant neoplasm of prostate: Secondary | ICD-10-CM

## 2023-06-12 DIAGNOSIS — Z923 Personal history of irradiation: Secondary | ICD-10-CM

## 2023-06-12 DIAGNOSIS — I129 Hypertensive chronic kidney disease with stage 1 through stage 4 chronic kidney disease, or unspecified chronic kidney disease: Secondary | ICD-10-CM | POA: Diagnosis present

## 2023-06-12 DIAGNOSIS — I70203 Unspecified atherosclerosis of native arteries of extremities, bilateral legs: Principal | ICD-10-CM | POA: Diagnosis present

## 2023-06-12 DIAGNOSIS — Z8673 Personal history of transient ischemic attack (TIA), and cerebral infarction without residual deficits: Secondary | ICD-10-CM | POA: Diagnosis not present

## 2023-06-12 DIAGNOSIS — I671 Cerebral aneurysm, nonruptured: Secondary | ICD-10-CM | POA: Diagnosis present

## 2023-06-12 DIAGNOSIS — Z79899 Other long term (current) drug therapy: Secondary | ICD-10-CM | POA: Diagnosis not present

## 2023-06-12 DIAGNOSIS — I10 Essential (primary) hypertension: Secondary | ICD-10-CM

## 2023-06-12 DIAGNOSIS — Z7982 Long term (current) use of aspirin: Secondary | ICD-10-CM

## 2023-06-12 DIAGNOSIS — Z881 Allergy status to other antibiotic agents status: Secondary | ICD-10-CM

## 2023-06-12 DIAGNOSIS — I70212 Atherosclerosis of native arteries of extremities with intermittent claudication, left leg: Secondary | ICD-10-CM | POA: Diagnosis not present

## 2023-06-12 DIAGNOSIS — I739 Peripheral vascular disease, unspecified: Secondary | ICD-10-CM | POA: Diagnosis present

## 2023-06-12 HISTORY — PX: ENDARTERECTOMY FEMORAL: SHX5804

## 2023-06-12 HISTORY — PX: PATCH ANGIOPLASTY: SHX6230

## 2023-06-12 HISTORY — PX: VEIN HARVEST: SHX6363

## 2023-06-12 LAB — COMPREHENSIVE METABOLIC PANEL WITH GFR
ALT: 23 U/L (ref 0–44)
AST: 25 U/L (ref 15–41)
Albumin: 3.9 g/dL (ref 3.5–5.0)
Alkaline Phosphatase: 52 U/L (ref 38–126)
Anion gap: 9 (ref 5–15)
BUN: 24 mg/dL — ABNORMAL HIGH (ref 8–23)
CO2: 27 mmol/L (ref 22–32)
Calcium: 10 mg/dL (ref 8.9–10.3)
Chloride: 103 mmol/L (ref 98–111)
Creatinine, Ser: 1.38 mg/dL — ABNORMAL HIGH (ref 0.61–1.24)
GFR, Estimated: 51 mL/min — ABNORMAL LOW (ref >60)
Glucose, Bld: 117 mg/dL — ABNORMAL HIGH (ref 70–99)
Potassium: 4.4 mmol/L (ref 3.5–5.1)
Sodium: 139 mmol/L (ref 135–145)
Total Bilirubin: 1 mg/dL (ref 0.0–1.2)
Total Protein: 6.8 g/dL (ref 6.5–8.1)

## 2023-06-12 LAB — CBC
HCT: 42 % (ref 39.0–52.0)
HCT: 36.2 % — ABNORMAL LOW (ref 39.0–52.0)
Hemoglobin: 14.5 g/dL (ref 13.0–17.0)
Hemoglobin: 12.4 g/dL — ABNORMAL LOW (ref 13.0–17.0)
MCH: 33.2 pg (ref 26.0–34.0)
MCH: 33 pg (ref 26.0–34.0)
MCHC: 34.5 g/dL (ref 30.0–36.0)
MCHC: 34.3 g/dL (ref 30.0–36.0)
MCV: 96.1 fL (ref 80.0–100.0)
MCV: 96.3 fL (ref 80.0–100.0)
Platelets: 144 10*3/uL — ABNORMAL LOW (ref 150–400)
Platelets: 117 10*3/uL — ABNORMAL LOW (ref 150–400)
RBC: 4.37 MIL/uL (ref 4.22–5.81)
RBC: 3.76 MIL/uL — ABNORMAL LOW (ref 4.22–5.81)
RDW: 13.1 % (ref 11.5–15.5)
RDW: 13.2 % (ref 11.5–15.5)
WBC: 6.1 10*3/uL (ref 4.0–10.5)
WBC: 4 10*3/uL (ref 4.0–10.5)
nRBC: 0 % (ref 0.0–0.2)
nRBC: 0 % (ref 0.0–0.2)

## 2023-06-12 LAB — TYPE AND SCREEN
ABO/RH(D): O POS
Antibody Screen: NEGATIVE

## 2023-06-12 LAB — PROTIME-INR
INR: 1.1 (ref 0.8–1.2)
Prothrombin Time: 13.9 s (ref 11.4–15.2)

## 2023-06-12 LAB — CREATININE, SERUM
Creatinine, Ser: 1.32 mg/dL — ABNORMAL HIGH (ref 0.61–1.24)
GFR, Estimated: 54 mL/min — ABNORMAL LOW (ref >60)

## 2023-06-12 LAB — POCT ACTIVATED CLOTTING TIME
Activated Clotting Time: 262 s
Activated Clotting Time: 204 s

## 2023-06-12 LAB — SURGICAL PCR SCREEN
MRSA, PCR: NEGATIVE
Staphylococcus aureus: NEGATIVE

## 2023-06-12 LAB — APTT: aPTT: 27 s (ref 24–36)

## 2023-06-12 SURGERY — ENDARTERECTOMY, FEMORAL
Anesthesia: General | Site: Leg Upper | Laterality: Left

## 2023-06-12 MED ORDER — ACETAMINOPHEN 325 MG RE SUPP: 325.0000 mg | RECTAL | Status: DC | PRN

## 2023-06-12 MED ORDER — SUCCINYLCHOLINE CHLORIDE 200 MG/10ML IV SOSY
PREFILLED_SYRINGE | INTRAVENOUS | Status: AC
  Filled 2023-06-12: qty 10

## 2023-06-12 MED ORDER — ADULT MULTIVITAMIN W/MINERALS CH
1.0000 | ORAL_TABLET | Freq: Every day | ORAL | Status: DC
Start: 2023-06-12 — End: 2023-06-14
  Administered 2023-06-12 – 2023-06-14 (×3): 1 via ORAL
  Filled 2023-06-12 (×3): qty 1

## 2023-06-12 MED ORDER — LIDOCAINE 2% (20 MG/ML) 5 ML SYRINGE
INTRAMUSCULAR | Status: AC
  Filled 2023-06-12: qty 5

## 2023-06-12 MED ORDER — HYDROCHLOROTHIAZIDE 12.5 MG PO TABS
12.5000 mg | ORAL_TABLET | Freq: Every day | ORAL | Status: DC
  Administered 2023-06-13 – 2023-06-14 (×2): 12.5 mg via ORAL
  Filled 2023-06-12 (×2): qty 1

## 2023-06-12 MED ORDER — ASPIRIN 81 MG PO CHEW
81.0000 mg | CHEWABLE_TABLET | Freq: Every day | ORAL | Status: DC
  Administered 2023-06-12 – 2023-06-13 (×2): 81 mg via ORAL
  Filled 2023-06-12 (×2): qty 1

## 2023-06-12 MED ORDER — FENTANYL CITRATE (PF) 250 MCG/5ML IJ SOLN
INTRAMUSCULAR | Status: AC
  Filled 2023-06-12: qty 5

## 2023-06-12 MED ORDER — METOPROLOL TARTRATE 5 MG/5ML IV SOLN: 2.0000 mg | INTRAVENOUS | Status: DC | PRN

## 2023-06-12 MED ORDER — EPHEDRINE 5 MG/ML INJ
INTRAVENOUS | Status: AC
  Filled 2023-06-12: qty 5

## 2023-06-12 MED ORDER — MIDAZOLAM HCL 2 MG/2ML IJ SOLN: 0.5000 mg | Freq: Once | INTRAMUSCULAR | Status: DC | PRN

## 2023-06-12 MED ORDER — 0.9 % SODIUM CHLORIDE (POUR BTL) OPTIME
TOPICAL | Status: DC | PRN
  Administered 2023-06-12: 2000 mL

## 2023-06-12 MED ORDER — MORPHINE SULFATE (PF) 2 MG/ML IV SOLN: 2.0000 mg | INTRAVENOUS | Status: DC | PRN

## 2023-06-12 MED ORDER — PHENYLEPHRINE HCL-NACL 20-0.9 MG/250ML-% IV SOLN
INTRAVENOUS | Status: DC | PRN
  Administered 2023-06-12: 50 ug/min via INTRAVENOUS

## 2023-06-12 MED ORDER — ACETAMINOPHEN 500 MG PO TABS
1000.0000 mg | ORAL_TABLET | Freq: Once | ORAL | Status: AC
  Administered 2023-06-12: 1000 mg via ORAL
  Filled 2023-06-12: qty 2

## 2023-06-12 MED ORDER — PHENYLEPHRINE 80 MCG/ML (10ML) SYRINGE FOR IV PUSH (FOR BLOOD PRESSURE SUPPORT)
PREFILLED_SYRINGE | INTRAVENOUS | Status: DC | PRN
  Administered 2023-06-12: 80 ug via INTRAVENOUS

## 2023-06-12 MED ORDER — HEPARIN SODIUM (PORCINE) 5000 UNIT/ML IJ SOLN
5000.0000 [IU] | Freq: Three times a day (TID) | INTRAMUSCULAR | Status: DC
  Administered 2023-06-13 – 2023-06-14 (×4): 5000 [IU] via SUBCUTANEOUS
  Filled 2023-06-12 (×4): qty 1

## 2023-06-12 MED ORDER — DEXAMETHASONE SODIUM PHOSPHATE 10 MG/ML IJ SOLN
INTRAMUSCULAR | Status: DC | PRN
  Administered 2023-06-12: 10 mg via INTRAVENOUS

## 2023-06-12 MED ORDER — LIDOCAINE 2% (20 MG/ML) 5 ML SYRINGE
INTRAMUSCULAR | Status: DC | PRN
  Administered 2023-06-12: 40 mg via INTRAVENOUS

## 2023-06-12 MED ORDER — LABETALOL HCL 5 MG/ML IV SOLN: 10.0000 mg | INTRAVENOUS | Status: DC | PRN

## 2023-06-12 MED ORDER — PHENOL 1.4 % MT LIQD: 1.0000 | OROMUCOSAL | Status: DC | PRN

## 2023-06-12 MED ORDER — GLYCOPYRROLATE 0.2 MG/ML IJ SOLN
INTRAMUSCULAR | Status: DC | PRN
  Administered 2023-06-12: .1 mg via INTRAVENOUS

## 2023-06-12 MED ORDER — OXYCODONE HCL 5 MG/5ML PO SOLN: 5.0000 mg | Freq: Once | ORAL | Status: DC | PRN

## 2023-06-12 MED ORDER — HEMOSTATIC AGENTS (NO CHARGE) OPTIME
TOPICAL | Status: DC | PRN
  Administered 2023-06-12: 1 via TOPICAL

## 2023-06-12 MED ORDER — ROSUVASTATIN CALCIUM 5 MG PO TABS
10.0000 mg | ORAL_TABLET | Freq: Every day | ORAL | Status: DC
  Administered 2023-06-12: 10 mg via ORAL
  Filled 2023-06-12: qty 2

## 2023-06-12 MED ORDER — SODIUM CHLORIDE 0.9 % IV SOLN
INTRAVENOUS | Status: DC
Start: 2023-06-12 — End: 2023-06-12

## 2023-06-12 MED ORDER — LOSARTAN POTASSIUM 50 MG PO TABS
50.0000 mg | ORAL_TABLET | Freq: Every day | ORAL | Status: DC
  Administered 2023-06-13 – 2023-06-14 (×2): 50 mg via ORAL
  Filled 2023-06-12 (×3): qty 1

## 2023-06-12 MED ORDER — GLYCOPYRROLATE PF 0.2 MG/ML IJ SOSY
PREFILLED_SYRINGE | INTRAMUSCULAR | Status: AC
  Filled 2023-06-12: qty 1

## 2023-06-12 MED ORDER — FENTANYL CITRATE (PF) 250 MCG/5ML IJ SOLN
INTRAMUSCULAR | Status: DC | PRN
  Administered 2023-06-12: 100 ug via INTRAVENOUS

## 2023-06-12 MED ORDER — CEFAZOLIN SODIUM-DEXTROSE 2-4 GM/100ML-% IV SOLN
2.0000 g | Freq: Three times a day (TID) | INTRAVENOUS | Status: AC
  Administered 2023-06-12 (×2): 2 g via INTRAVENOUS
  Filled 2023-06-12 (×2): qty 100

## 2023-06-12 MED ORDER — HEPARIN SODIUM (PORCINE) 1000 UNIT/ML IJ SOLN
INTRAMUSCULAR | Status: DC | PRN
Start: 2023-06-12 — End: 2023-06-12
  Administered 2023-06-12: 8000 [IU] via INTRAVENOUS

## 2023-06-12 MED ORDER — DOCUSATE SODIUM 100 MG PO CAPS: 100.0000 mg | ORAL_CAPSULE | Freq: Every day | ORAL | Status: DC | PRN

## 2023-06-12 MED ORDER — CHLORHEXIDINE GLUCONATE CLOTH 2 % EX PADS: 6.0000 | MEDICATED_PAD | Freq: Once | CUTANEOUS | Status: DC

## 2023-06-12 MED ORDER — POTASSIUM CHLORIDE CRYS ER 20 MEQ PO TBCR: 20.0000 meq | EXTENDED_RELEASE_TABLET | Freq: Every day | ORAL | Status: DC | PRN

## 2023-06-12 MED ORDER — HEPARIN SODIUM (PORCINE) 1000 UNIT/ML IJ SOLN
INTRAMUSCULAR | Status: AC
  Filled 2023-06-12: qty 10

## 2023-06-12 MED ORDER — LOSARTAN POTASSIUM-HCTZ 50-12.5 MG PO TABS: 1.0000 | ORAL_TABLET | Freq: Every day | ORAL | Status: DC

## 2023-06-12 MED ORDER — ORAL CARE MOUTH RINSE: 15.0000 mL | Freq: Once | OROMUCOSAL | Status: AC

## 2023-06-12 MED ORDER — METOPROLOL SUCCINATE ER 50 MG PO TB24
50.0000 mg | ORAL_TABLET | Freq: Every day | ORAL | Status: DC
  Administered 2023-06-12 – 2023-06-13 (×2): 50 mg via ORAL
  Filled 2023-06-12 (×2): qty 1

## 2023-06-12 MED ORDER — ACETAMINOPHEN 325 MG PO TABS: 325.0000 mg | ORAL_TABLET | ORAL | Status: DC | PRN

## 2023-06-12 MED ORDER — ONDANSETRON HCL 4 MG/2ML IJ SOLN
INTRAMUSCULAR | Status: AC
  Filled 2023-06-12: qty 2

## 2023-06-12 MED ORDER — EPHEDRINE SULFATE-NACL 50-0.9 MG/10ML-% IV SOSY
PREFILLED_SYRINGE | INTRAVENOUS | Status: DC | PRN
  Administered 2023-06-12: 5 mg via INTRAVENOUS

## 2023-06-12 MED ORDER — MAGNESIUM SULFATE 2 GM/50ML IV SOLN: 2.0000 g | Freq: Every day | INTRAVENOUS | Status: DC | PRN

## 2023-06-12 MED ORDER — PHENYLEPHRINE 80 MCG/ML (10ML) SYRINGE FOR IV PUSH (FOR BLOOD PRESSURE SUPPORT)
PREFILLED_SYRINGE | INTRAVENOUS | Status: AC
  Filled 2023-06-12: qty 10

## 2023-06-12 MED ORDER — CYANOCOBALAMIN 500 MCG PO TABS
500.0000 ug | ORAL_TABLET | Freq: Every day | ORAL | Status: DC
  Administered 2023-06-12 – 2023-06-14 (×3): 500 ug via ORAL
  Filled 2023-06-12 (×3): qty 1

## 2023-06-12 MED ORDER — ROCURONIUM BROMIDE 10 MG/ML (PF) SYRINGE
PREFILLED_SYRINGE | INTRAVENOUS | Status: AC
Start: 2023-06-12 — End: ?
  Filled 2023-06-12: qty 10

## 2023-06-12 MED ORDER — ALBUMIN HUMAN 5 % IV SOLN
INTRAVENOUS | Status: AC
  Filled 2023-06-12: qty 250

## 2023-06-12 MED ORDER — PROPOFOL 10 MG/ML IV BOLUS
INTRAVENOUS | Status: DC | PRN
  Administered 2023-06-12: 100 mg via INTRAVENOUS

## 2023-06-12 MED ORDER — DEXAMETHASONE SODIUM PHOSPHATE 10 MG/ML IJ SOLN
INTRAMUSCULAR | Status: AC
Start: 2023-06-12 — End: ?
  Filled 2023-06-12: qty 1

## 2023-06-12 MED ORDER — PANTOPRAZOLE SODIUM 40 MG PO TBEC
40.0000 mg | DELAYED_RELEASE_TABLET | Freq: Every day | ORAL | Status: DC
  Administered 2023-06-12 – 2023-06-14 (×3): 40 mg via ORAL
  Filled 2023-06-12 (×3): qty 1

## 2023-06-12 MED ORDER — PROPOFOL 10 MG/ML IV BOLUS
INTRAVENOUS | Status: AC
  Filled 2023-06-12: qty 20

## 2023-06-12 MED ORDER — ALBUMIN HUMAN 5 % IV SOLN
12.5000 g | Freq: Once | INTRAVENOUS | Status: AC
  Administered 2023-06-12: 12.5 g via INTRAVENOUS

## 2023-06-12 MED ORDER — FLUTICASONE PROPIONATE 50 MCG/ACT NA SUSP: 2.0000 | Freq: Every day | NASAL | Status: DC | PRN

## 2023-06-12 MED ORDER — CEFAZOLIN SODIUM-DEXTROSE 2-4 GM/100ML-% IV SOLN
2.0000 g | INTRAVENOUS | Status: AC
  Administered 2023-06-12: 2 g via INTRAVENOUS
  Filled 2023-06-12: qty 100

## 2023-06-12 MED ORDER — OXYCODONE HCL 5 MG PO TABS: 5.0000 mg | ORAL_TABLET | ORAL | Status: DC | PRN

## 2023-06-12 MED ORDER — BISACODYL 10 MG RE SUPP: 10.0000 mg | Freq: Every day | RECTAL | Status: DC | PRN

## 2023-06-12 MED ORDER — PROTAMINE SULFATE 10 MG/ML IV SOLN
INTRAVENOUS | Status: DC | PRN
  Administered 2023-06-12: 25 mg via INTRAVENOUS

## 2023-06-12 MED ORDER — SODIUM CHLORIDE 0.9 % IV SOLN: 500.0000 mL | Freq: Once | INTRAVENOUS | Status: DC | PRN

## 2023-06-12 MED ORDER — ONDANSETRON HCL 4 MG/2ML IJ SOLN: 4.0000 mg | Freq: Four times a day (QID) | INTRAMUSCULAR | Status: DC | PRN

## 2023-06-12 MED ORDER — LATANOPROST 0.005 % OP SOLN
1.0000 [drp] | Freq: Every day | OPHTHALMIC | Status: DC
  Administered 2023-06-12 – 2023-06-13 (×2): 1 [drp] via OPHTHALMIC
  Filled 2023-06-12: qty 2.5

## 2023-06-12 MED ORDER — DOCUSATE SODIUM 100 MG PO CAPS
100.0000 mg | ORAL_CAPSULE | Freq: Every day | ORAL | Status: DC
  Administered 2023-06-13 – 2023-06-14 (×2): 100 mg via ORAL
  Filled 2023-06-12 (×2): qty 1

## 2023-06-12 MED ORDER — OXYCODONE HCL 5 MG PO TABS: 5.0000 mg | ORAL_TABLET | Freq: Once | ORAL | Status: DC | PRN

## 2023-06-12 MED ORDER — PROTAMINE SULFATE 10 MG/ML IV SOLN
INTRAVENOUS | Status: AC
  Filled 2023-06-12: qty 5

## 2023-06-12 MED ORDER — SODIUM CHLORIDE 0.9 % IV SOLN: INTRAVENOUS | Status: AC

## 2023-06-12 MED ORDER — GUAIFENESIN-DM 100-10 MG/5ML PO SYRP: 15.0000 mL | ORAL_SOLUTION | ORAL | Status: DC | PRN

## 2023-06-12 MED ORDER — ROCURONIUM BROMIDE 10 MG/ML (PF) SYRINGE
PREFILLED_SYRINGE | INTRAVENOUS | Status: DC | PRN
  Administered 2023-06-12 (×2): 20 mg via INTRAVENOUS
  Administered 2023-06-12: 60 mg via INTRAVENOUS

## 2023-06-12 MED ORDER — AMLODIPINE BESYLATE 2.5 MG PO TABS
2.5000 mg | ORAL_TABLET | Freq: Every day | ORAL | Status: DC
  Administered 2023-06-12 – 2023-06-13 (×2): 2.5 mg via ORAL
  Filled 2023-06-12 (×3): qty 1

## 2023-06-12 MED ORDER — FENTANYL CITRATE (PF) 100 MCG/2ML IJ SOLN: 25.0000 ug | INTRAMUSCULAR | Status: DC | PRN

## 2023-06-12 MED ORDER — SODIUM CHLORIDE 0.9 % IV SOLN: INTRAVENOUS | Status: DC

## 2023-06-12 MED ORDER — CHLORHEXIDINE GLUCONATE 0.12 % MT SOLN
15.0000 mL | Freq: Once | OROMUCOSAL | Status: AC
  Administered 2023-06-12: 15 mL via OROMUCOSAL
  Filled 2023-06-12: qty 15

## 2023-06-12 MED ORDER — SUGAMMADEX SODIUM 200 MG/2ML IV SOLN
INTRAVENOUS | Status: DC | PRN
  Administered 2023-06-12: 150 mg via INTRAVENOUS

## 2023-06-12 MED ORDER — HEPARIN 6000 UNIT IRRIGATION SOLUTION
Status: DC | PRN
  Administered 2023-06-12: 1

## 2023-06-12 MED ORDER — ALUM & MAG HYDROXIDE-SIMETH 200-200-20 MG/5ML PO SUSP
15.0000 mL | ORAL | Status: DC | PRN
  Administered 2023-06-13: 30 mL via ORAL
  Filled 2023-06-12: qty 30

## 2023-06-12 MED ORDER — VITAMIN C 500 MG PO TABS
500.0000 mg | ORAL_TABLET | Freq: Every day | ORAL | Status: DC
  Administered 2023-06-12 – 2023-06-14 (×3): 500 mg via ORAL
  Filled 2023-06-12 (×3): qty 1

## 2023-06-12 MED ORDER — HYDRALAZINE HCL 20 MG/ML IJ SOLN: 5.0000 mg | INTRAMUSCULAR | Status: DC | PRN

## 2023-06-12 SURGICAL SUPPLY — 27 items
BAG COUNTER SPONGE SURGICOUNT (BAG) ×2 IMPLANT
CANISTER SUCTION 3000ML PPV (SUCTIONS) ×2 IMPLANT
CLIP LIGATING EXTRA MED SLVR (CLIP) ×2 IMPLANT
CLIP LIGATING EXTRA SM BLUE (MISCELLANEOUS) ×2 IMPLANT
CLIP TI LARGE 6 (CLIP) IMPLANT
COVER PROBE W GEL 5X96 (DRAPES) IMPLANT
DERMABOND ADVANCED .7 DNX12 (GAUZE/BANDAGES/DRESSINGS) ×2 IMPLANT
ELECTRODE REM PT RTRN 9FT ADLT (ELECTROSURGICAL) ×2 IMPLANT
EVACUATOR SILICONE 100CC (DRAIN) IMPLANT
GLOVE BIO SURGEON STRL SZ7.5 (GLOVE) ×2 IMPLANT
GOWN STRL REUS W/ TWL LRG LVL3 (GOWN DISPOSABLE) ×4 IMPLANT
GOWN STRL REUS W/ TWL XL LVL3 (GOWN DISPOSABLE) ×2 IMPLANT
KIT BASIN OR (CUSTOM PROCEDURE TRAY) ×2 IMPLANT
KIT TURNOVER KIT B (KITS) ×2 IMPLANT
NS IRRIG 1000ML POUR BTL (IV SOLUTION) ×4 IMPLANT
PACK PERIPHERAL VASCULAR (CUSTOM PROCEDURE TRAY) ×2 IMPLANT
PAD ARMBOARD POSITIONER FOAM (MISCELLANEOUS) ×4 IMPLANT
POWDER SURGICEL 3.0 GRAM (HEMOSTASIS) IMPLANT
SUT MNCRL AB 4-0 PS2 18 (SUTURE) ×2 IMPLANT
SUT PROLENE 5 0 C 1 24 (SUTURE) ×2 IMPLANT
SUT PROLENE 5 0 C 1 36 (SUTURE) IMPLANT
SUT PROLENE 6 0 BV (SUTURE) IMPLANT
SUT VIC AB 2-0 CT1 TAPERPNT 27 (SUTURE) ×2 IMPLANT
SUT VIC AB 3-0 SH 27X BRD (SUTURE) ×2 IMPLANT
TOWEL GREEN STERILE (TOWEL DISPOSABLE) ×4 IMPLANT
TOWEL GREEN STERILE FF (TOWEL DISPOSABLE) ×2 IMPLANT
WATER STERILE IRR 1000ML POUR (IV SOLUTION) ×2 IMPLANT

## 2023-06-12 NOTE — H&P (Signed)
 H=P   History of Present Illness: This is a 83 y.o. male with history of 2 previous right lower extremity bypasses.  Now complains of very short distance claudication on the left which is lifestyle limiting.  He denies any wounds on the left side.  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  He remains on aspirin  and statin.  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   Bilateral: Noted on MRA of brain in November 2015 with collateral flow from posterior circulation and external collaterals reconstituting anterior circulation   Chronic kidney disease    Colon polyps 2004, 07, 14   COPD (chronic obstructive pulmonary disease) (HCC)    GERD (gastroesophageal reflux disease)    diet controlled, no meds   Glaucoma    left eye    Hx of radiation therapy 03/22/11 to 05/08/11   PSA recurrent carcinoma of prostate   Hypercholesterolemia    Hypertension    Macular degeneration of both eyes    MRSA infection greater than 3 months ago    2004   Peripheral vascular disease (HCC) 07/2011   Bilateral SFA stenosis: Status post right femoropopliteal bypass - Dr. 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   Right-sided 19 mm x 8 mm fusiform aneurysm    Prostate cancer (HCC) 2004   Renal artery stenosis (HCC)    70% left   Stones in the urinary tract    Stroke [facility])    Urinary incontinence     Past Surgical History:  Procedure Laterality Date   ABDOMINAL AORTAGRAM N/A 06/20/2011   Procedure: ABDOMINAL Tommi Fraise;  Surgeon: Margherita Shell, MD;  Location: [facility] CATH LAB;  Service: Cardiovascular;  Laterality: N/A;   ABDOMINAL AORTOGRAM W/LOWER EXTREMITY N/A 12/20/2020   Procedure: ABDOMINAL AORTOGRAM W/LOWER EXTREMITY;  Surgeon: Adine Hoof, MD;   Location: [facility] INVASIVE CV LAB;  Service: Cardiovascular;  Laterality: N/A;   AMPUTATION Right 12/28/2020   Procedure: RIGHT 4TH TOE AMPUTATION;  Surgeon: Adine Hoof, MD;  Location: [facility] OR;  Service: Vascular;  Laterality: Right;   CARDIOVASCULAR STRESS TEST  07/06/2011   Evidence of mild ischemia in Basal Inferior and Mid Inferior regions, no significant wall motion abnormalities noted.   clamp to penis     for urinary control   COLONOSCOPY     COLONOSCOPY W/ BIOPSIES     multiple    CYSTOSCOPY W/ URETERAL STENT PLACEMENT Left 06/07/2015   Procedure: CYSTOSCOPY, RETROGRADE PYLEGRAM  WITH STENT PLACEMENT;  Surgeon: Erman Hayward, MD;  Location: [facility] ORS;  Service: Urology;  Laterality: Left;   CYSTOSCOPY WITH RETROGRADE PYELOGRAM, URETEROSCOPY AND STENT PLACEMENT Left 07/28/2015   Procedure: CYSTOSCOPY, URETEROSCOPY, WITH BASKET STONE REMOVAL;  Surgeon: Florencio Hunting, MD;  Location: [facility] ORS;  Service: Urology;  Laterality: Left;   ENDARTERECTOMY FEMORAL Right 12/21/2020   Procedure: RIGHT FEMORAL ENDARTERECTOMY;  Surgeon: Adine Hoof, MD;  Location: [facility] OR;  Service: Vascular;  Laterality: Right;   EYE SURGERY Right 08/2014   Cataract; removed bilat   FEMORAL-POPLITEAL BYPASS GRAFT  07/19/2011   Procedure: BYPASS GRAFT FEMORAL-POPLITEAL ARTERY;  Surgeon: Palma Bob, MD;  Location: [facility] OR;  Service: Vascular;  Laterality: Right;  Right Femoral - Popliteal  Bypss with saphenous vein   FEMORAL-POPLITEAL  BYPASS GRAFT Right 12/21/2020   Procedure: RIGHT FEMORAL-POPLITEAL ARTERY BYPASS GRAFT WITH PTFE;  Surgeon: Adine Hoof, MD;  Location: [facility] OR;  Service: Vascular;  Laterality: Right;   HERNIA REPAIR     right side,lft   INTRAOPERATIVE ARTERIOGRAM  07/19/2011   Procedure: INTRA OPERATIVE ARTERIOGRAM;  Surgeon: Palma Bob, MD;  Location: [facility] OR;  Service: Vascular;  Laterality: Right;   IR RADIOLOGIST EVAL & MGMT  02/14/2023   LOWER EXTREMITY  ANGIOGRAPHY Bilateral 06/07/2023   Procedure: Lower Extremity Angiography;  Surgeon: Knox Perl, MD;  Location: [facility] INVASIVE CV LAB;  Service: Cardiovascular;  Laterality: Bilateral;   NM MYOVIEW  LTD  07/2011   Normal EF, very small area of basal-mid inferior ischemia; LOW RISK   PENILE PROSTHESIS  REMOVAL     PENILE PROSTHESIS IMPLANT     PR VEIN BYPASS GRAFT,AORTO-FEM-POP  07/19/2011   Right  Fem/Pop BPG   PROSTATE BIOPSY     prostatectomy retropubic radical  07/15/2002   with nerve sparing, Gleason 3+4=7   Reclast   07/25/2013   RENAL DUPLEX  07/01/2012   Left renal artery 60-99% diameter reduction   TRANSTHORACIC ECHOCARDIOGRAM  01/2017   Normal EF and overall function    Allergies  Allergen Reactions   Avelox [Moxifloxacin Hcl In Nacl] Diarrhea   Moxifloxacin Diarrhea and Other (See Comments)   Cilostazol  Diarrhea   Cyclobenzaprine Other (See Comments)   Quinolones     Other reaction(s): Unknown   Ramipril Other (See Comments)    Other reaction(s): Unknown    Prior to Admission medications   Medication Sig Start Date End Date Taking? Authorizing Provider  acetaminophen  (TYLENOL ) 500 MG tablet Take 1,000 mg by mouth every 6 (six) hours as needed for mild pain (pain score 1-3) or headache.   Yes [provider]  amLODipine  (NORVASC ) 2.5 MG tablet Take 1 tablet (2.5 mg total) by mouth at bedtime. 03/07/23  Yes Knox Perl, MD  ascorbic acid (VITAMIN C) 500 MG tablet Take 500 mg by mouth daily.   Yes [provider]  aspirin  (ASPIRIN  CHILDRENS) 81 MG chewable tablet Chew 1 tablet (81 mg total) by mouth daily. Patient taking differently: Chew 81 mg by mouth at bedtime. 11/17/21  Yes Knox Perl, MD  calcium -vitamin D (OSCAL WITH D) 500-5 MG-MCG tablet Take 1 tablet by mouth daily.   Yes [provider]  fluticasone  (FLONASE ) 50 MCG/ACT nasal spray Place 2 sprays into both nostrils daily as needed for rhinitis or allergies (or nasal congestion). 09/02/12  Yes  [provider]  latanoprost  (XALATAN ) 0.005 % ophthalmic solution Place 1 drop into the left eye at bedtime.  09/18/13  Yes [provider]  losartan -hydrochlorothiazide  (HYZAAR ) 50-12.5 MG tablet Take 1 tablet by mouth daily. 05/24/23  Yes Knox Perl, MD  metoprolol  succinate (TOPROL -XL) 50 MG 24 hr tablet Take 1 tablet (50 mg total) by mouth daily. Take with or immediately following a meal. Patient taking differently: Take 50 mg by mouth at bedtime. Take with or immediately following a meal. 05/24/23 08/22/23 Yes Knox Perl, MD  Multiple Vitamin (MULTIVITAMIN WITH MINERALS) TABS tablet Take 1 tablet by mouth daily. 12/31/20  Yes Regalado, Belkys A, MD  Multiple Vitamins-Minerals (LUTEIN-ZEAXANTHIN) TABS Take 1 tablet by mouth daily.   Yes [provider]  omeprazole (PRILOSEC) 20 MG capsule Take 20 mg by mouth daily as needed (acid reflux). 05/08/22  Yes [provider]  rosuvastatin  (CRESTOR ) 10 MG tablet TAKE 1 TABLET BY MOUTH EVERY  DAY Patient taking differently: Take 10 mg by mouth at bedtime. 10/12/20  Yes Avanell Leigh, MD  vitamin B-12 (CYANOCOBALAMIN ) 500 MCG tablet Take 1 tablet (500 mcg total) by mouth daily. 12/31/20  Yes Regalado, Belkys A, MD  Vitamin D, Ergocalciferol, (DRISDOL) 1.25 MG (50000 UNIT) CAPS capsule Take 50,000 Units by mouth every 7 (seven) days. 09/01/21  Yes [provider]  docusate sodium  (COLACE) 100 MG capsule Take 1 capsule (100 mg total) by mouth daily as needed for mild constipation. 12/25/20   Pokhrel, Amador Bad, MD    Social History   Socioeconomic History   Marital status: Married    Spouse name: Not on file   Number of children: 2   Years of education: Not on file   Highest education level: Not on file  Occupational History   Occupation: Chief Executive Officer: [employer]  Tobacco Use   Smoking status: Former    Current packs/day: 0.00    Average packs/day: 3.0 packs/day for 30.0 years (90.0 ttl  pk-yrs)    Types: Cigarettes    Start date: 06/30/1972    Quit date: 07/01/2002    Years since quitting: 20.9   Smokeless tobacco: Never  Vaping Use   Vaping status: Never Used  Substance and Sexual Activity   Alcohol use: No   Drug use: No   Sexual activity: Not Currently  Other Topics Concern   Not on file  Social History Narrative   Married, 2 children , Retail buyer - predominantly commercial, 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   Usually walks 2 miles maybe 2-3 days a week. Just not a cold weather.   He is a former smoker, quit "cold [place] "after 3 pack per day for 60 pack years in 2004   Social Drivers of Health   Financial Resource Strain: Not on file  Food Insecurity: Not on file  Transportation Needs: Not on file  Physical Activity: Not on file  Stress: Not on file  Social Connections: Not on file  Intimate Partner Violence: Not on file     Family History  Problem Relation Age of Onset   Prostate cancer Brother 20   Heart disease Brother        Before age 26   Hypertension Brother    Heart attack Brother    Aneurysm Father    Heart disease Father    Hypertension Father    Heart disease Mother        Before age 58   Hypertension Mother    Heart attack Mother    Heart disease Sister    Hypertension Sister    Heart attack Sister    Colon cancer Neg Hx    Esophageal cancer Neg Hx    Stomach cancer Neg Hx    Rectal cancer Neg Hx    Colon polyps Neg Hx     ROS: Claudication left lower extremity  Physical Examination  Vitals:   06/12/23 0557  BP: 130/79  Pulse: 66  Resp: 17  Temp: 97.6 F (36.4 C)  SpO2: 99%   Body mass index is 24.63 kg/m.  Awake alert oriented Nonlabored respirations Right common femoral pulse easily palpable with well-healed wound Well-healed fourth toe amputation on the right Left common femoral pulses not easily palpable  CBC    Component Value Date/Time   WBC 5.4 06/05/2023 0837   WBC 10.3  02/04/2022 1933   RBC 4.36 06/05/2023 0837   RBC  4.22 02/04/2022 1933   HGB 14.1 06/05/2023 0837   HCT 42.6 06/05/2023 0837   PLT 179 06/05/2023 0837   MCV 98 (H) 06/05/2023 0837   MCH 32.3 06/05/2023 0837   MCH 33.4 02/04/2022 1933   MCHC 33.1 06/05/2023 0837   MCHC 35.1 02/04/2022 1933   RDW 12.5 06/05/2023 0837   LYMPHSABS 1.6 12/19/2020 1128   MONOABS 0.8 12/19/2020 1128   EOSABS 0.2 12/19/2020 1128   BASOSABS 0.1 12/19/2020 1128    BMET    Component Value Date/Time   NA 140 06/05/2023 0837   K 4.6 06/05/2023 0837   CL 100 06/05/2023 0837   CO2 24 06/05/2023 0837   GLUCOSE 134 (H) 06/05/2023 0837   GLUCOSE 163 (H) 02/04/2022 1933   BUN 26 06/05/2023 0837   CREATININE 1.40 (H) 06/05/2023 0837   CALCIUM  9.5 06/05/2023 0837   GFRNONAA 50 (L) 02/04/2022 1933   GFRAA 51 (L) 11/25/2015 1210    COAGS: Lab Results  Component Value Date   INR 1.00 04/27/2015   INR 1.02 12/15/2013   INR 1.03 07/13/2011     Non-Invasive Vascular Imaging:   Angiogram images reviewed   ASSESSMENT/PLAN: This is a 83 y.o. male status post right lower extremity revascularization now with short distance claudication on the left lower extremity with severely depressed monophasic ABI.  Plan will be for left common femoral endarterectomy with patch angioplasty today in the OR.  We have discussed the risk benefits alternatives he demonstrates good understanding and consent was signed.  Russell Quinney C. Vikki Graves, MD [facility] Office: [phone] Pager: [phone]

## 2023-06-12 NOTE — Discharge Instructions (Signed)

## 2023-06-12 NOTE — Anesthesia Procedure Notes (Addendum)
 Procedure Name: Intubation Date/Time: 06/12/2023 7:30 AM  Performed by: Pasty Bongo, CRNAPre-anesthesia Checklist: Patient identified, Emergency Drugs available, Suction available and Patient being monitored Patient Re-evaluated:Patient Re-evaluated prior to induction Oxygen Delivery Method: Circle System Utilized Preoxygenation: Pre-oxygenation with 100% oxygen Induction Type: IV induction Ventilation: Mask ventilation without difficulty Laryngoscope Size: Mac and 3 Grade View: Grade I Tube type: Oral Tube size: 7.5 mm Number of attempts: 1 Airway Equipment and Method: Stylet and Oral airway Placement Confirmation: ETT inserted through vocal cords under direct vision, positive ETCO2 and breath sounds checked- equal and bilateral Secured at: 23 (@lip ) cm Tube secured with: Tape Dental Injury: Teeth and Oropharynx as per pre-operative assessment  Comments: Atraumatic

## 2023-06-12 NOTE — Progress Notes (Addendum)
 Patient arrived at the unit,CHG bath given,vitals taken,CCMD notified,left groin incision bruised but soft , bilateral dorsalis pedis pulse dopplered, pt oriented to the unit,call bell in reach

## 2023-06-12 NOTE — Progress Notes (Signed)
  Day of Surgery Note    Subjective:  resting in recovery   Vitals:   06/12/23 1030 06/12/23 1045  BP: 100/60 98/63  Pulse: 67 67  Resp: 19 14  Temp: (!) 96.5 F (35.8 C)   SpO2: 95% 94%    Incisions:   left groin is clean without hematoma Extremities:  brisk left pero/DP/PT Cardiac:  regular Lungs:  non labored    Assessment/Plan:  This is a 83 y.o. male who is s/p  Left CFA endarterectomy including SFA, profonda-femoral artery and large medial circumflex with vein patch angioplasty on 06/12/2023 by Dr. Vikki Graves  -pt doing well in recovery-brisk doppler flow left foot and incision looks fine without hematoma. -asa to resume this evening.  -continue statin -to 4 east later today -anticipate he will need a couple of days in hospital.   Maryanna Smart, PA-C 06/12/2023 10:52 AM [phone]

## 2023-06-12 NOTE — Transfer of Care (Signed)
 Immediate Anesthesia Transfer of Care Note  Patient: CAILAN STUDENT  Procedure(s) Performed: LEFT FEMORAL ENDARTERECTOMY WITH VEIN PATCH ANGIOPLASTY (Left: Groin) ANGIOPLASTY, USING VEIN PATCH (Left: Groin) SURGICAL PROCUREMENT, VEIN (Left: Leg Upper)  Patient Location: PACU  Anesthesia Type:General  Level of Consciousness: awake, alert , and patient cooperative  Airway & Oxygen Therapy: Patient Spontanous Breathing  Post-op Assessment: Report given to RN and Post -op Vital signs reviewed and stable  Post vital signs: Reviewed and stable  Last Vitals:  Vitals Value Taken Time  BP 100/60 06/12/23 1030  Temp    Pulse 65 06/12/23 1032  Resp 15 06/12/23 1032  SpO2 95 % 06/12/23 1032  Vitals shown include unfiled device data.  Last Pain:  Vitals:   06/12/23 0619  TempSrc:   PainSc: 0-No pain         Complications: No notable events documented.

## 2023-06-12 NOTE — Anesthesia Procedure Notes (Signed)
 Arterial Line Insertion Start/End5/13/2025 7:39 AM Performed by: Jonne Netters, MD, anesthesiologist  Patient location: OR. Preanesthetic checklist: patient identified, IV checked, risks and benefits discussed, surgical consent, monitors and equipment checked, pre-op evaluation, timeout performed and anesthesia consent Right, radial was placed Catheter size: 20 G Hand hygiene performed   Attempts: 1 Procedure performed without using ultrasound guided technique. Following insertion, dressing applied. Patient tolerated the procedure well with no immediate complications. Additional procedure comments: Poor wave form, but able to sample arterial blood for intra-op ACT checks  Fay Hoop, MD.

## 2023-06-12 NOTE — Plan of Care (Signed)
  Problem: Activity: Goal: Ability to tolerate increased activity will improve Outcome: Progressing   Problem: Skin Integrity: Goal: Demonstration of wound healing without infection will improve Outcome: Progressing   Problem: Education: Goal: Knowledge of General Education information will improve Description: Including pain rating scale, medication(s)/side effects and non-pharmacologic comfort measures Outcome: Progressing   Problem: Clinical Measurements: Goal: Ability to maintain clinical measurements within normal limits will improve Outcome: Progressing Goal: Will remain free from infection Outcome: Progressing Goal: Diagnostic test results will improve Outcome: Progressing Goal: Respiratory complications will improve Outcome: Progressing Goal: Cardiovascular complication will be avoided Outcome: Progressing   Problem: Activity: Goal: Risk for activity intolerance will decrease Outcome: Progressing   Problem: Nutrition: Goal: Adequate nutrition will be maintained Outcome: Progressing   Problem: Elimination: Goal: Will not experience complications related to bowel motility Outcome: Progressing Goal: Will not experience complications related to urinary retention Outcome: Progressing   Problem: Pain Managment: Goal: General experience of comfort will improve and/or be controlled Outcome: Progressing   Problem: Safety: Goal: Ability to remain free from injury will improve Outcome: Progressing   Problem: Skin Integrity: Goal: Risk for impaired skin integrity will decrease Outcome: Progressing

## 2023-06-12 NOTE — Op Note (Signed)
 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 Vikki Graves, MD Assistant: Maryanna Smart, PA Procedure Performed: 1.  Harvest left greater saphenous vein 2.  Extensive left common femoral endarterectomy including superficial femoral artery, profundofemoral artery and large medial circumflex artery with vein patch angioplasty  Indications: 83 year old male with history of right lower extremity revascularization and well-healed right fourth toe amputation.  He now has short distance claudication on the left has undergone angiography which demonstrated significant calcific common femoral disease with tight stenosis of the profunda and he is indicated for common femoral endarterectomy.  In experience assistant was necessary to facilitate exposure of the left common femoral artery and its inflow and outflow branches as well as performing endarterectomy of a very diseased artery and harvest of the greater saphenous vein and patch angioplasty.  Findings: Great saphenous vein was harvested for approximately 10 cm through the groin incision this was suitable for patch angioplasty.  The artery itself had no pulse was heavily calcified and extensive endarterectomy was performed and there was very strong inflow at completion with outflow via a large medial circumflex branch and lateral profundofemoral artery.   Procedure:  The patient was identified in the holding area and taken to the operating room where he was placed supine operative table and general anesthesia was induced.  He was sterilely prepped and draped in the left groin in the usual fashion, antibiotics were administered a timeout was called.  A vertical incision was created overlying the palpable common femoral artery although there was no identifiable  pulse there.  We dissected down to the artery where there was apparent scar tissue likely from previous arterial cannulations and from inflammation of the artery.  We dissected out of the SFA, very large medial circumflex branch a large lateral profunda.  We also isolated circumflex branches and under the inguinal ligament we were able to identify a normal feeling external iliac artery that had a strong pulse and this was isolated as well.  Through the same incision we isolated the great saphenous vein and harvested this for approximately 10 cm where it was doubly clipped with large clips distally transected and then transected at the saphenofemoral junction and oversewn with 5-0 Prolene suture in a running mattress fashion.  At this time the patient was fully heparinized.  The vein was spatulated.  The outflow followed by the inflow of the common femoral artery was clamped and the highly calcified artery was opened longitudinally and extensive endarterectomy was performed to include the proximal SFA, the medial circumflex branch, the profundofemoral artery.  There was very strong inflow from the external iliac artery.  When endarterectomy was completed we then sewed the vein in a reverse fashion as a patch angioplasty with 5-0 Prolene suture.  Prior completion we allowed flushing in all directions.  Upon completion we did to place several repair sutures given the thinness of the artery but ultimately there was very strong flow in the medial circumflex and profunda branch and even in the SFA by Doppler and 25 mg of protamine  was administered.  We obtained hemostasis and thoroughly irrigated the wound and closed in layers of Vicryl and Monocryl.  Dermabond is placed at the skin level.  The patient was awakened from anesthesia having tolerated the procedure without any complication.  All counts were correct at completion.  EBL: 200 cc  Jahari Billy C. Vikki Graves, MD [facility] Office:  [phone] Pager: [phone]

## 2023-06-12 NOTE — Anesthesia Postprocedure Evaluation (Signed)
 Anesthesia Post Note  Patient: Lance Dillon  Procedure(s) Performed: LEFT FEMORAL ENDARTERECTOMY WITH VEIN PATCH ANGIOPLASTY (Left: Groin) ANGIOPLASTY, USING VEIN PATCH (Left: Groin) SURGICAL PROCUREMENT, VEIN (Left: Leg Upper)     Patient location during evaluation: PACU Anesthesia Type: General Level of consciousness: awake and alert, patient cooperative and oriented Pain management: pain level controlled Vital Signs Assessment: post-procedure vital signs reviewed and stable Respiratory status: spontaneous breathing, nonlabored ventilation and respiratory function stable Cardiovascular status: blood pressure returned to baseline and stable Postop Assessment: no apparent nausea or vomiting Anesthetic complications: no   There were no known notable events for this encounter.  Last Vitals:  Vitals:   06/12/23 1030 06/12/23 1045  BP: 100/60 98/63  Pulse: 67 67  Resp: 19 14  Temp: (!) 35.8 C   SpO2: 95% 94%    Last Pain:  Vitals:   06/12/23 1030  TempSrc:   PainSc: 0-No pain                 Navjot Pilgrim,E. Amantha Sklar

## 2023-06-13 ENCOUNTER — Encounter (HOSPITAL_COMMUNITY): Payer: Self-pay | Admitting: Vascular Surgery

## 2023-06-13 LAB — BASIC METABOLIC PANEL WITH GFR
Anion gap: 11 (ref 5–15)
BUN: 25 mg/dL — ABNORMAL HIGH (ref 8–23)
CO2: 22 mmol/L (ref 22–32)
Calcium: 8.9 mg/dL (ref 8.9–10.3)
Chloride: 105 mmol/L (ref 98–111)
Creatinine, Ser: 1.6 mg/dL — ABNORMAL HIGH (ref 0.61–1.24)
GFR, Estimated: 43 mL/min — ABNORMAL LOW (ref >60)
Glucose, Bld: 144 mg/dL — ABNORMAL HIGH (ref 70–99)
Potassium: 4.2 mmol/L (ref 3.5–5.1)
Sodium: 138 mmol/L (ref 135–145)

## 2023-06-13 LAB — CBC
HCT: 33.1 % — ABNORMAL LOW (ref 39.0–52.0)
Hemoglobin: 11.5 g/dL — ABNORMAL LOW (ref 13.0–17.0)
MCH: 33 pg (ref 26.0–34.0)
MCHC: 34.7 g/dL (ref 30.0–36.0)
MCV: 94.8 fL (ref 80.0–100.0)
Platelets: 124 10*3/uL — ABNORMAL LOW (ref 150–400)
RBC: 3.49 MIL/uL — ABNORMAL LOW (ref 4.22–5.81)
RDW: 12.7 % (ref 11.5–15.5)
WBC: 10.7 10*3/uL — ABNORMAL HIGH (ref 4.0–10.5)
nRBC: 0 % (ref 0.0–0.2)

## 2023-06-13 LAB — LIPID PANEL
Cholesterol: 107 mg/dL (ref 0–200)
HDL: 42 mg/dL (ref >40)
LDL Cholesterol: 48 mg/dL (ref 0–99)
Total CHOL/HDL Ratio: 2.5 ratio
Triglycerides: 83 mg/dL (ref <150)
VLDL: 17 mg/dL (ref 0–40)

## 2023-06-13 MED ORDER — ROSUVASTATIN CALCIUM 20 MG PO TABS
20.0000 mg | ORAL_TABLET | Freq: Every day | ORAL | Status: DC
  Administered 2023-06-13: 20 mg via ORAL
  Filled 2023-06-13: qty 1

## 2023-06-13 NOTE — Evaluation (Signed)
 Occupational Therapy Evaluation Patient Details Name: Lance Dillon MRN: [id number] DOB: 03-18-1940 Today's Date: 06/13/2023   History of Present Illness   83 y/o male who is s/p 83 y.o. male is s/p Harvest left greater saphenous vein. Extensive left common femoral endarterectomy including superficial femoral artery, profundofemoral artery and large medial circumflex artery with vein patch angioplasty on 06/12/23. In 2022, pt underwent femoral to below knee popliteal bypass 11/22. 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 PMH includes HTN, heart murmur, COPD, atherosclerosis, renal artery stenosis, blindness in right eye, macular degeneration, right femoral to popliteal bypass grafts (2013).     Clinical Impressions Patient evaluated by Occupational Therapy with no further acute OT needs identified. All education has been completed and the patient has no further questions.  See below for any follow-up Occupational Therapy or equipment needs. OT is signing off. Thank you for this referral.      If plan is discharge home, recommend the following:   A little help with bathing/dressing/bathroom;Assistance with cooking/housework;Assist for transportation     Functional Status Assessment   Patient has had a recent decline in their functional status and demonstrates the ability to make significant improvements in function in a reasonable and predictable amount of time.     Equipment Recommendations   None recommended by OT     Recommendations for Other Services   PT consult     Precautions/Restrictions   Precautions Precautions: Fall Recall of Precautions/Restrictions: Intact Restrictions Weight Bearing Restrictions Per Provider Order: No     Mobility Bed Mobility Overal bed mobility: Modified Independent                  Transfers                          Balance Overall balance assessment: Mild deficits observed, not formally tested (due to  chronic LT foot drop. Improves with cues for pt to overexagerate hip/knee flexion to help Lt foot clear floor, but pt fatigues with this.)                                         ADL either performed or assessed with clinical judgement   ADL Overall ADL's : At baseline                                       General ADL Comments: Pt able to demonstrate LE dressing, grooming, hallway ambulation with RW, and functional UE use to perform ADLs at or near his baseline. Pt with LT foot drop which pt reports is baseline. Pt tried a tradition afo in the past and did not like it. Pt and spouse educated on newer AFOs that attach to strings of shoe rather than going inside shoe. Pt prefers to wear lace up Vaughn Georges, so this would be compatable. Spouse given handout.     Vision Baseline Vision/History: 6 Macular Degeneration;1 Wears glasses (And RT eye blindness) Ability to See in Adequate Light: 2 Moderately impaired Patient Visual Report: Blurring of vision;No change from baseline;Central vision impairment       Perception         Praxis         Pertinent Vitals/Pain Pain Assessment Pain Assessment: No/denies pain  Extremity/Trunk Assessment Upper Extremity Assessment Upper Extremity Assessment: Right hand dominant;Overall [facility] for tasks assessed;RUE deficits/detail RUE Deficits / Details: Grossly 4/5 compared to 5/5 LT shoulder. Pt does not know why but reports this is normal. Grip WNL   Lower Extremity Assessment Lower Extremity Assessment:  (sensation diminished to feet but not absent.)       Communication Communication Communication: No apparent difficulties   Cognition Arousal: Alert Behavior During Therapy: WFL for tasks assessed/performed Cognition: No apparent impairments                               Following commands: Intact       Cueing  General Comments          Exercises Other Exercises Other Exercises: Per pt  request, pt ambulated in hallway ~100' with RW, ocassional cues to clear LT foot.   Shoulder Instructions      Home Living Family/patient expects to be discharged to:: Private residence Living Arrangements: Spouse/significant other Available Help at Discharge: Family;Available 24 hours/day Type of Home: House Home Access: Stairs to enter [employer] of Steps: 2 Entrance Stairs-Rails: Right;Left Home Layout: One level     Bathroom Shower/Tub: Chief Strategy Officer: Handicapped height Bathroom Accessibility: Yes How Accessible: Accessible via walker Home Equipment: Cane - single point;Rolling Walker (2 wheels);BSC/3in1;Tub bench;Rollator (4 wheels)          Prior Functioning/Environment Prior Level of Function : Independent/Modified Independent             Mobility Comments: Uses cane most of the time, RW in community. Denied any recent falls. ADLs Comments: Independent. "I still work a little" Pt reports job is rather physical. Pt has choice to recuperate as long as needed.    OT Problem List:  (chronic LT foot drop.)   OT Treatment/Interventions:        OT Goals(Current goals can be found in the care plan section)   Acute Rehab OT Goals OT Goal Formulation: All assessment and education complete, DC therapy   OT Frequency:       Co-evaluation              AM-PAC OT "6 Clicks" Daily Activity     Outcome Measure Help from another person eating meals?: None Help from another person taking care of personal grooming?: None Help from another person toileting, which includes using toliet, bedpan, or urinal?: A Little Help from another person bathing (including washing, rinsing, drying)?: A Little Help from another person to put on and taking off regular upper body clothing?: None Help from another person to put on and taking off regular lower body clothing?: None 6 Click Score: 22   End of Session Equipment Utilized During Treatment:  Gait belt;Rolling walker (2 wheels) Nurse Communication: Mobility status  Activity Tolerance: Patient tolerated treatment well Patient left: in bed;with call bell/phone within reach;with family/visitor present                   Time: 1610-9604 OT Time Calculation (min): 33 min Charges:  OT General Charges $OT Visit: 1 Visit OT Evaluation $OT Eval Low Complexity: 1 Low OT Treatments $Therapeutic Activity: 8-22 mins  [facility], OT Acute Rehab Services Office: [phone] 06/13/2023   Asher Blade 06/13/2023, 12:33 PM

## 2023-06-13 NOTE — Plan of Care (Signed)
  Problem: Education: Goal: Knowledge of prescribed regimen will improve Outcome: Progressing   Problem: Activity: Goal: Ability to tolerate increased activity will improve Outcome: Progressing   

## 2023-06-13 NOTE — Progress Notes (Incomplete)
 Mobility Specialist Progress Note:    06/13/23 1528  Mobility  Activity Ambulated with assistance to bathroom  Level of Assistance Standby assist, set-up cues, supervision of patient - no hands on  Assistive Device Front wheel walker  Distance Ambulated (ft) 30 ft  Activity Response Tolerated well  Mobility Referral Yes  Mobility visit 1 Mobility  Mobility Specialist Start Time (ACUTE ONLY) 1525  Mobility Specialist Stop Time (ACUTE ONLY) 1528  Mobility Specialist Time Calculation (min) (ACUTE ONLY) 3 min   Pt received sitting EOB, requesting assistance to go to bathroom. SV required with RW for safety. Tolerated well, void successful. Returned pt to bed with all needs met, wife in chair at bedside. All needs met.   Maurita Havener Mobility Specialist Please contact via Special educational needs teacher or  Rehab office at [phone]

## 2023-06-13 NOTE — Evaluation (Signed)
 Physical Therapy Evaluation Patient Details Name: Lance Dillon MRN: [id number] DOB: Sep 22, 1940 Today's Date: 06/13/2023  History of Present Illness  83 y/o male [facility] for extensive L common femoral endarterectomy with patch angioplasty.  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   Clinical Impression  Pt in bed upon arrival and agreeable to PT eval. PTA, pt was ModI with SP cane in the home and RW in the community. In today's session, pt was ModI for bed mobility and CGA to stand and ambulate 200 ft with RW. Pt had increased difficulty with L foot clearance and knee flexion during swing phase of gait. Pt reported being close to functional mobility baseline as he has had difficulty ambulating and has had multiple OP PT visits. Pt was also able to ascend/descend 3 steps with B handrails and CGA for safety. Pt currently with functional limitations due to the deficits listed below (see PT Problem List). Pt would benefit from acute skilled PT to address functional impairments. Recommending post-acute OP PT to work towards independence with mobility. Pt has 24/7 physical assist available from family upon return home. Acute PT to follow.         If plan is discharge home, recommend the following: Assist for transportation;Help with stairs or ramp for entrance   Can travel by private vehicle   Yes     Equipment Recommendations None recommended by PT     Functional Status Assessment Patient has had a recent decline in their functional status and demonstrates the ability to make significant improvements in function in a reasonable and predictable amount of time.     Precautions / Restrictions Precautions Precautions: Fall Recall of Precautions/Restrictions: Intact Restrictions Weight Bearing Restrictions Per Provider  Order: No      Mobility  Bed Mobility Overal bed mobility: Modified Independent     Transfers Overall transfer level: Needs assistance Equipment used: Rolling walker (2 wheels) Transfers: Sit to/from Stand Sit to Stand: Supervision    General transfer comment: supervision for safety    Ambulation/Gait Ambulation/Gait assistance: Contact guard assist Gait Distance (Feet): 200 Feet Assistive device: Rolling walker (2 wheels) Gait Pattern/deviations: Step-to pattern, Decreased stance time - left, Decreased step length - left, Decreased weight shift to left Gait velocity: decr     General Gait Details: L LE slightly externally rotated with limited ankle DF and knee flexion during swing phase  Stairs Stairs: Yes Stairs assistance: Contact guard assist Stair Management: One rail Right, One rail Left, Alternating pattern, Step to pattern, Forwards Number of Stairs: 3 General stair comments: cues for sequencing, able to use step through pattern to descend steps alternating leading leg.    Balance Overall balance assessment: Mild deficits observed, not formally tested       Pertinent Vitals/Pain Pain Assessment Pain Assessment: No/denies pain    Home Living Family/patient expects to be discharged to:: Private residence Living Arrangements: Spouse/significant other Available Help at Discharge: Family;Available 24 hours/day Type of Home: House Home Access: Stairs to enter Entrance Stairs-Rails: Right;Left;Can reach both Entrance Stairs-Number of Steps: 2   Home Layout: One level Home Equipment: Cane - single Librarian, academic (2 wheels);BSC/3in1;Tub bench;Rollator (4 wheels)      Prior Function Prior Level of Function : Independent/Modified Independent  Mobility Comments: Uses cane most of the time, RW in community. Denied any recent falls. ADLs Comments: Independent. "  I still work a Biomedical engineer Extremity Assessment Upper  Extremity Assessment: Defer to OT evaluation RUE Deficits / Details: Grossly 4/5 compared to 5/5 LT shoulder. Pt does not know why but reports this is normal. Grip WNL    Lower Extremity Assessment Lower Extremity Assessment: LLE deficits/detail;RLE deficits/detail (diminished sensation to B feet, alert to light touch) RLE Deficits / Details: Prior R 4th ray amputation LLE Deficits / Details: WFL ROM, during gait L LE slightly externally rotated with limited ankle DF and knee flexion during swing phase    Cervical / Trunk Assessment Cervical / Trunk Assessment: Normal  Communication   Communication Communication: No apparent difficulties    Cognition Arousal: Alert Behavior During Therapy: WFL for tasks assessed/performed   PT - Cognitive impairments: No apparent impairments    Following commands: Intact       Cueing Cueing Techniques: Verbal cues     General Comments General comments (skin integrity, edema, etc.): Reports he has worked with OP PT multiple different times for gait and LE strength. Only saw limited progress     PT Assessment Patient needs continued PT services  PT Problem List Decreased strength;Decreased activity tolerance;Decreased balance;Decreased mobility       PT Treatment Interventions DME instruction;Gait training;Stair training;Functional mobility training;Therapeutic activities;Therapeutic exercise;Balance training;Neuromuscular re-education;Patient/family education    PT Goals (Current goals can be found in the Care Plan section)  Acute Rehab PT Goals Patient Stated Goal: to be able to go back to work PT Goal Formulation: With patient/family Time For Goal Achievement: 06/27/23 Potential to Achieve Goals: Good    Frequency Min 1X/week        AM-PAC PT "6 Clicks" Mobility  Outcome Measure Help needed turning from your back to your side while in a flat bed without using bedrails?: None Help needed moving from lying on your back to sitting  on the side of a flat bed without using bedrails?: None Help needed moving to and from a bed to a chair (including a wheelchair)?: A Little Help needed standing up from a chair using your arms (e.g., wheelchair or bedside chair)?: A Little Help needed to walk in hospital room?: A Little Help needed climbing 3-5 steps with a railing? : A Little 6 Click Score: 20    End of Session Equipment Utilized During Treatment: Gait belt Activity Tolerance: Patient tolerated treatment well Patient left: in bed;with call bell/phone within reach;with family/visitor present Nurse Communication: Mobility status PT Visit Diagnosis: Unsteadiness on feet (R26.81);Other abnormalities of gait and mobility (R26.89);Muscle weakness (generalized) (M62.81)    Time: 0981-1914 PT Time Calculation (min) (ACUTE ONLY): 20 min   Charges:   PT Evaluation $PT Eval Low Complexity: 1 Low   PT General Charges $$ ACUTE PT VISIT: 1 Visit       Orysia Blas, PT, DPT Secure Chat Preferred  Rehab Office [phone]   Alissa April Adela Ades 06/13/2023, 2:03 PM

## 2023-06-13 NOTE — Progress Notes (Addendum)
  Progress Note    06/13/2023 7:52 AM 1 Day Post-Op  Subjective:  denies any pain. Has not ambulated yet. Tolerating diet   Vitals:   06/13/23 0410 06/13/23 0728  BP: 109/61 116/66  Pulse: 76   Resp: 20   Temp: 98.1 F (36.7 C) 98 F (36.7 C)  SpO2: 96%    Physical Exam: Cardiac:  regular Lungs:  non labored Incisions:  left groin incision intact and well appearing, soft without swelling or hematoma. Mild ecchymosis present Extremities:  BLE well perfused and warm with Doppler DP/ PT signals Abdomen:  soft Neurologic: alert and oriented   CBC    Component Value Date/Time   WBC 10.7 (H) 06/13/2023 0346   RBC 3.49 (L) 06/13/2023 0346   HGB 11.5 (L) 06/13/2023 0346   HGB 14.1 06/05/2023 0837   HCT 33.1 (L) 06/13/2023 0346   HCT 42.6 06/05/2023 0837   PLT 124 (L) 06/13/2023 0346   PLT 179 06/05/2023 0837   MCV 94.8 06/13/2023 0346   MCV 98 (H) 06/05/2023 0837   MCH 33.0 06/13/2023 0346   MCHC 34.7 06/13/2023 0346   RDW 12.7 06/13/2023 0346   RDW 12.5 06/05/2023 0837   LYMPHSABS 1.6 12/19/2020 1128   MONOABS 0.8 12/19/2020 1128   EOSABS 0.2 12/19/2020 1128   BASOSABS 0.1 12/19/2020 1128    BMET    Component Value Date/Time   NA 138 06/13/2023 0346   NA 140 06/05/2023 0837   K 4.2 06/13/2023 0346   CL 105 06/13/2023 0346   CO2 22 06/13/2023 0346   GLUCOSE 144 (H) 06/13/2023 0346   BUN 25 (H) 06/13/2023 0346   BUN 26 06/05/2023 0837   CREATININE 1.60 (H) 06/13/2023 0346   CALCIUM  8.9 06/13/2023 0346   GFRNONAA 43 (L) 06/13/2023 0346   GFRAA 51 (L) 11/25/2015 1210    INR    Component Value Date/Time   INR 1.1 06/12/2023 0620     Intake/Output Summary (Last 24 hours) at 06/13/2023 0752 Last data filed at 06/13/2023 0414 Gross per 24 hour  Intake 1660 ml  Output 1225 ml  Net 435 ml     Assessment/Plan:  83 y.o. male is s/p 1.  Harvest left greater saphenous vein 2.  Extensive left common femoral endarterectomy including superficial femoral  artery, profundofemoral artery and large medial circumflex artery with vein patch angioplasty 1 Day Post-Op   Left groin incision well appearing, no swelling or hematoma BLE well perfused and warm with doppler DP/PT signals PT/OT to eval today Scr up slightly from baseline to 1.6. encourage po intake of fluids Mobilize as tolerated Hemodynamically stable Anticipate d/c home tomorrow if he continues to progress well   Deneen Finical, PA-C [facility] [phone] 06/13/2023 7:52 AM  I have independently interviewed and examined patient and agree with PA assessment and plan above.  Strong peroneal signal on the left and his foot is warm.  Vonetta Foulk C. Vikki Graves, MD [facility] [place] Office: [phone] Pager: [phone]

## 2023-06-13 NOTE — Progress Notes (Signed)
 PHARMACIST LIPID MONITORING   Lance Dillon is a 83 y.o. male admitted on 06/12/2023 with very short distance claudication.  Pharmacy has been consulted to optimize lipid-lowering therapy with the indication of secondary prevention for clinical ASCVD.  Recent Labs:  Lipid Panel (last 6 months):   Lab Results  Component Value Date   CHOL 107 06/13/2023   TRIG 83 06/13/2023   HDL 42 06/13/2023   CHOLHDL 2.5 06/13/2023   VLDL 17 06/13/2023   LDLCALC 48 06/13/2023    Hepatic function panel (last 6 months):   Lab Results  Component Value Date   AST 25 06/12/2023   ALT 23 06/12/2023   ALKPHOS 52 06/12/2023   BILITOT 1.0 06/12/2023    SCr (since admission):   Serum creatinine: 1.6 mg/dL (H) 16/10/96 0454 Estimated creatinine clearance: 34.4 mL/min (A)  Current therapy and lipid therapy tolerance Current lipid-lowering therapy: Crestor  10mg  Previous lipid-lowering therapies (if applicable): n/a Documented or reported allergies or intolerances to lipid-lowering therapies (if applicable): n/a  Assessment:   Patient agrees with changes to lipid-lowering therapy  Plan:    1.Statin intensity (high intensity recommended for all patients regardless of the LDL):  Add or increase statin to high intensity.  2.Add ezetimibe (if any one of the following):   Not indicated at this time.  3.Refer to lipid clinic:   No  4.Follow-up with:  Cardiology provider - Knox Perl, MD  5.Follow-up labs after discharge:  Changes in lipid therapy were made. Check a lipid panel in 8-12 weeks then annually.      Mohammed Andrew, PharmD Clinical Pharmacist 06/13/2023 10:32 AM Please check AMION for all [facility] numbers

## 2023-06-14 LAB — BASIC METABOLIC PANEL WITH GFR
Anion gap: 10 (ref 5–15)
BUN: 30 mg/dL — ABNORMAL HIGH (ref 8–23)
CO2: 26 mmol/L (ref 22–32)
Calcium: 8.7 mg/dL — ABNORMAL LOW (ref 8.9–10.3)
Chloride: 101 mmol/L (ref 98–111)
Creatinine, Ser: 1.53 mg/dL — ABNORMAL HIGH (ref 0.61–1.24)
GFR, Estimated: 45 mL/min — ABNORMAL LOW (ref >60)
Glucose, Bld: 105 mg/dL — ABNORMAL HIGH (ref 70–99)
Potassium: 4 mmol/L (ref 3.5–5.1)
Sodium: 137 mmol/L (ref 135–145)

## 2023-06-14 MED ORDER — ROSUVASTATIN CALCIUM 20 MG PO TABS: 20.0000 mg | ORAL_TABLET | Freq: Every day | ORAL | 11 refills | Status: AC

## 2023-06-14 MED ORDER — OXYCODONE HCL 5 MG PO TABS: 5.0000 mg | ORAL_TABLET | Freq: Four times a day (QID) | ORAL | 0 refills | Status: DC | PRN

## 2023-06-14 NOTE — Progress Notes (Addendum)
  Progress Note    06/14/2023 8:29 AM 2 Days Post-Op  Subjective:  no complaints, denies any pain   Vitals:   06/14/23 0326 06/14/23 0827  BP: 110/72 108/64  Pulse: 70   Resp: 20 18  Temp: 98 F (36.7 C) 98.5 F (36.9 C)  SpO2: 94%    Physical Exam: Cardiac:  regular Lungs:  non labored Incisions: left groin incision intact and well appearing, soft without swelling or hematoma. Mild ecchymosis present  Extremities:  BLE well perfused and warm with Doppler DP/ PT signals  Neurologic: alert and oriented  CBC    Component Value Date/Time   WBC 10.7 (H) 06/13/2023 0346   RBC 3.49 (L) 06/13/2023 0346   HGB 11.5 (L) 06/13/2023 0346   HGB 14.1 06/05/2023 0837   HCT 33.1 (L) 06/13/2023 0346   HCT 42.6 06/05/2023 0837   PLT 124 (L) 06/13/2023 0346   PLT 179 06/05/2023 0837   MCV 94.8 06/13/2023 0346   MCV 98 (H) 06/05/2023 0837   MCH 33.0 06/13/2023 0346   MCHC 34.7 06/13/2023 0346   RDW 12.7 06/13/2023 0346   RDW 12.5 06/05/2023 0837   LYMPHSABS 1.6 12/19/2020 1128   MONOABS 0.8 12/19/2020 1128   EOSABS 0.2 12/19/2020 1128   BASOSABS 0.1 12/19/2020 1128    BMET    Component Value Date/Time   NA 138 06/13/2023 0346   NA 140 06/05/2023 0837   K 4.2 06/13/2023 0346   CL 105 06/13/2023 0346   CO2 22 06/13/2023 0346   GLUCOSE 144 (H) 06/13/2023 0346   BUN 25 (H) 06/13/2023 0346   BUN 26 06/05/2023 0837   CREATININE 1.60 (H) 06/13/2023 0346   CALCIUM  8.9 06/13/2023 0346   GFRNONAA 43 (L) 06/13/2023 0346   GFRAA 51 (L) 11/25/2015 1210    INR    Component Value Date/Time   INR 1.1 06/12/2023 0620    No intake or output data in the 24 hours ending 06/14/23 0829   Assessment/Plan:  83 y.o. male is s/p 1.  Harvest left greater saphenous vein 2.  Extensive left common femoral endarterectomy including superficial femoral artery, profundofemoral artery and large medial circumflex artery with vein patch angioplasty 2 Days Post-Op   Left groin incision intact  and well appearing, ecchymosis present. Soft without swelling or hematoma BLE well perfused and warm with Doppler DP/PT signals No pain PT recommending outpatient PT Continue to mobilize as tolerated BMP ordered and pending Okay for discharge home following lab review Continue Aspirin , statin He will follow up in our office in 2-3 weeks for incision check   Remona Carmel [facility] [phone] 06/14/2023 8:29 AM   I have independently interviewed and examined patient and agree with PA assessment and plan above.  His strong signal is peroneal but he does feel his posterior tibial and dorsalis pedis as well.  His foot is very warm well-perfused and incision is healing well although with mild ecchymosis.  Will recheck BMP and plan for discharge later today if creatinine is stable or improved.  Toyia Jelinek C. Vikki Graves, MD [facility] [place] Office: [phone] Pager: [phone]

## 2023-06-14 NOTE — Plan of Care (Signed)
  Problem: Education: Goal: Knowledge of prescribed regimen will improve Outcome: Adequate for Discharge   Problem: Activity: Goal: Ability to tolerate increased activity will improve Outcome: Adequate for Discharge   Problem: Bowel/Gastric: Goal: Gastrointestinal status for postoperative course will improve Outcome: Adequate for Discharge   Problem: Clinical Measurements: Goal: Postoperative complications will be avoided or minimized Outcome: Adequate for Discharge Goal: Signs and symptoms of graft occlusion will improve Outcome: Adequate for Discharge

## 2023-06-14 NOTE — Progress Notes (Signed)
 AVS completed; printed and given to RN to review with patient at discharge. Discharge delayed due to pending lab results.

## 2023-06-15 NOTE — Transitions of Care (Post Inpatient/ED Visit) (Signed)
 06/15/2023  Patient ID: Lance Dillon, male   DOB: [birth year], 83 y.o.   MRN: [id number]   Medication Review for transitions of care.  See [employer] for documentation.  Shayanne Gomm J. Vishnu Moeller RN, MSN [facility], [facility] RN Care Manager Direct Dial: [phone]  Fax: [phone] Website: [email or web address]

## 2023-06-18 NOTE — Discharge Summary (Signed)
 Bypass Discharge Summary Patient ID: Lance Dillon [id number] 82 y.o. 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 He now has short distance claudication on the left has undergone angiography which demonstrated significant calcific common femoral disease with tight stenosis of the profunda and he is indicated for common femoral endarterectomy.   Hospital Course: [name] was admitted on 06/12/23 and underwent harvesting of his left greater saphenous vein and extensive left common femoral endarterectomy including superficial femoral artery, profundofemoral artery and large medial circumflex artery with vein patch angioplasty by Dr. Vikki Graves. He tolerated the procedure well and was taken to recovery room in stable condition.  By POD#1, he did well over night. No pain in left groin incision or left leg. Left groin incision intact without swelling or hematoma. Left leg well perfused and warm with doppler PT/DP signals. Tolerating diet. Voiding without difficulty. Ambulating in room and hallway without difficulty. BMP showed slight elevation in serum creatinine from his baseline.  Encouraged po fluids. PT/OT evaluated patient and recommended outpatient PT. Order placed for outpatient PT. He otherwise remained hemodynamically stable and progressed well post operatively.   By POD#2, patient remained stable for discharge home. No pain in Left lower extremity. Left groin incision remained intact and well appearing. Left leg well perfused and warm with doppler DP/PT signals. On repeat BMP, creatinine improved. He will continue on Aspirin  and Statin. He will resume all home medications as prescribed. PDMP was reviewed and post operative pain medication was sent to his pharmacy. He has follow up arranged in 2-3 weeks for incision check.    Consults: None  Treatments: antibiotics: Ancef , analgesia: acetaminophen  and Aspirin , anticoagulation: ASA, subcutaneous heparin ; therapies: PT, OT, RN, and SW, and surgery: 1.  Harvest left greater saphenous vein2.  Extensive left common femoral endarterectomy including superficial femoral artery, profundofemoral artery and large medial circumflex artery with vein patch angioplasty   Disposition: Discharge disposition: 01-Home or Self Care       - For VQI Registry use ---  Post-op:  Wound infection: No  Graft infection: No  Transfusion: No  If yes, 0 units given New Arrhythmia: No Patency judged by: Galerius.Gant ] Dopper only, [ ]  Palpable graft pulse, [ ]  Palpable distal pulse, [ ]  ABI inc. > 0.15, [ ]  Duplex D/C Ambulatory Status: Ambulatory  Complications: MI: Galerius.Gant ] No, [ ]  Troponin only, [ ]  EKG or Clinical CHF: No Resp failure: [ X] none, [ ]  Pneumonia, [ ]  Ventilator Chg in renal function: [ X] none, [ ]  Inc. Cr > 0.5, [ ]  Temp. Dialysis, [ ]  Permanent dialysis Stroke: [ X] None, [ ]  Minor, [ ]  Major Return to OR: No  Reason for return to OR: [ ]  Bleeding, [ ]  Infection, [ ]  Thrombosis, [ ]  Revision  Discharge medications: Statin use:  Yes ASA use:  Yes Plavix   use:  No  for medical reason not indicated Beta blocker use:  Yes Coumadin use: No  for medical reason not indicated    Patient Instructions:  Allergies as of 06/14/2023       Reactions   Avelox [moxifloxacin Hcl In Nacl] Diarrhea   Moxifloxacin Diarrhea, Other (See Comments)   Cilostazol  Diarrhea   Cyclobenzaprine Other (See Comments)   Quinolones    Other reaction(s): Unknown   Ramipril Other (See Comments)   Other reaction(s): Unknown        Medication List     TAKE these medications    acetaminophen  500 MG tablet Commonly known as: TYLENOL  Take 1,000 mg by mouth every 6 (six) hours as needed for mild pain (pain score 1-3) or headache.   amLODipine  2.5 MG tablet Commonly known as: NORVASC  Take 1 tablet (2.5 mg total) by mouth at bedtime.   ascorbic acid  500 MG tablet Commonly known as: VITAMIN C  Take 500 mg by mouth daily.   aspirin  81 MG chewable tablet Commonly known as: Aspirin  Childrens Chew 1 tablet (81 mg total) by mouth daily. What changed: when to take this   calcium -vitamin D 500-5 MG-MCG tablet Commonly known as: OSCAL WITH D Take 1 tablet by mouth daily.   cyanocobalamin  500 MCG tablet Commonly known as: VITAMIN B12 Take 1 tablet (500 mcg total) by mouth daily.   docusate sodium  100 MG capsule Commonly known as: COLACE Take 1 capsule (100 mg total) by mouth daily as needed for mild constipation.   fluticasone  50 MCG/ACT nasal spray Commonly known as: FLONASE  Place 2 sprays into both nostrils daily as needed for rhinitis or allergies (or nasal congestion).   latanoprost  0.005 % ophthalmic solution Commonly known as: XALATAN  Place 1 drop into the left eye at bedtime.   losartan -hydrochlorothiazide  50-12.5 MG tablet Commonly known as: Hyzaar  Take 1 tablet by mouth daily.   Lutein-Zeaxanthin Tabs Take 1 tablet by mouth daily.   metoprolol  succinate 50 MG 24 hr tablet Commonly known as: TOPROL -XL Take 1 tablet (50 mg total) by mouth daily. Take with or immediately following a meal. What changed:  when to take this   multivitamin with minerals Tabs tablet Take 1 tablet by mouth daily.   omeprazole 20 MG capsule Commonly known as: PRILOSEC Take 20 mg by mouth daily as needed (acid reflux).   oxyCODONE  5 MG immediate release tablet Commonly known as: Oxy IR/ROXICODONE  Take 1 tablet (5 mg total) by mouth every 6 (six) hours as needed for severe pain (pain score 7-10).   rosuvastatin  20 MG tablet Commonly known as: CRESTOR  Take 1 tablet (20 mg total) by mouth at bedtime. What changed:  medication strength how much to take when to take this   Vitamin D (Ergocalciferol) 1.25 MG (50000 UNIT) Caps capsule Commonly known as: DRISDOL Take 50,000 Units by mouth every 7 (seven) days.       Activity: activity as tolerated, no driving while on analgesics, and no heavy lifting for 6 weeks Diet: regular diet and low fat, low cholesterol diet Wound Care: okay to shower and wash incision with mild soap and water, pat dry. Do not soak in bathtub  Follow-up with [facility] in 2-3 weeks.  Signed: Deneen Finical 06/18/2023 1:05 PM

## 2023-06-20 ENCOUNTER — Encounter (HOSPITAL_COMMUNITY)

## 2023-06-21 ENCOUNTER — Telehealth: Payer: Self-pay

## 2023-06-21 NOTE — Telephone Encounter (Signed)
 Pt's wife called to let us  know the end of his incision has some light red blood that they started noticing today. He denies any pus, redness, swelling, fever. Advised her to monitor the area and keep it clean and dry. She is aware to call us  back if the drainage does not resolve or worsens or any s/s of infection arise so that we can get him seen.

## 2023-06-22 ENCOUNTER — Encounter: Payer: Self-pay | Admitting: Vascular Surgery

## 2023-06-22 ENCOUNTER — Telehealth: Payer: Self-pay | Admitting: *Deleted

## 2023-06-22 ENCOUNTER — Ambulatory Visit: Attending: Vascular Surgery | Admitting: Vascular Surgery

## 2023-06-22 VITALS — BP 110/68 | HR 71 | Temp 98.0°F | Resp 18 | Ht 68.0 in | Wt 165.3 lb

## 2023-06-22 DIAGNOSIS — I739 Peripheral vascular disease, unspecified: Secondary | ICD-10-CM

## 2023-06-22 NOTE — Progress Notes (Signed)
 Patient ID: COYLE STORDAHL, male   DOB: 03-08-1940, 83 y.o.   MRN: [id number]  Reason for Consult: Triage   Referred by Imelda Man, MD  Subjective:  HPI EXCELL NEYLAND is a 83 y.o. male with a history of PAD who underwent a left femoral, SFA and profunda endarterectomy with vein patch angioplasty on 06/12/2023.  He presents today for triage appointment with concerns of drainage and a small area of dehiscence at the groin incision.  He denies any fevers or chills.  There has been some pink-tinged/clear drainage.  He reports his leg pain is much improved and is able to walk without significant discomfort.  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   Bilateral: Noted on MRA of brain in November 2015 with collateral flow from posterior circulation and external collaterals reconstituting anterior circulation   Chronic kidney disease    Colon polyps 2004, 07, 14   COPD (chronic obstructive pulmonary disease) (HCC)    GERD (gastroesophageal reflux disease)    diet controlled, no meds   Glaucoma    left eye    Hx of radiation therapy 03/22/11 to 05/08/11   PSA recurrent carcinoma of prostate   Hypercholesterolemia    Hypertension    Macular degeneration of both eyes    MRSA infection greater than 3 months ago    2004   Peripheral vascular disease (HCC) 07/2011   Bilateral SFA stenosis: Status post right femoropopliteal bypass - Dr. 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   Right-sided 19 mm x 8 mm fusiform aneurysm    Prostate cancer (HCC) 2004   Renal artery stenosis (HCC)    70% left   Stones in the urinary tract    Stroke [facility])    Urinary incontinence    Family History  Problem Relation Age of Onset   Prostate cancer Brother 69   Heart disease Brother        Before  age 74   Hypertension Brother    Heart attack Brother    Aneurysm Father    Heart disease Father    Hypertension Father    Heart disease Mother        Before age 37   Hypertension Mother    Heart attack Mother    Heart disease Sister    Hypertension Sister    Heart attack Sister    Colon cancer Neg Hx    Esophageal cancer Neg Hx    Stomach cancer Neg Hx    Rectal cancer Neg Hx    Colon polyps Neg Hx    Past Surgical History:  Procedure Laterality Date   ABDOMINAL AORTAGRAM N/A 06/20/2011   Procedure: ABDOMINAL Tommi Fraise;  Surgeon: Margherita Shell, MD;  Location: [facility] CATH LAB;  Service: Cardiovascular;  Laterality: N/A;   ABDOMINAL AORTOGRAM W/LOWER EXTREMITY N/A 12/20/2020   Procedure: ABDOMINAL AORTOGRAM W/LOWER EXTREMITY;  Surgeon: Adine Hoof, MD;  Location: [facility] INVASIVE CV LAB;  Service: Cardiovascular;  Laterality: N/A;   AMPUTATION Right 12/28/2020   Procedure: RIGHT 4TH TOE AMPUTATION;  Surgeon: Adine Hoof, MD;  Location: [facility] OR;  Service: Vascular;  Laterality: Right;   CARDIOVASCULAR STRESS TEST  07/06/2011   Evidence of mild ischemia in Basal Inferior and Mid Inferior regions, no  significant wall motion abnormalities noted.   clamp to penis     for urinary control   COLONOSCOPY     COLONOSCOPY W/ BIOPSIES     multiple    CYSTOSCOPY W/ URETERAL STENT PLACEMENT Left 06/07/2015   Procedure: CYSTOSCOPY, RETROGRADE PYLEGRAM  WITH STENT PLACEMENT;  Surgeon: Erman Hayward, MD;  Location: [facility] ORS;  Service: Urology;  Laterality: Left;   CYSTOSCOPY WITH RETROGRADE PYELOGRAM, URETEROSCOPY AND STENT PLACEMENT Left 07/28/2015   Procedure: CYSTOSCOPY, URETEROSCOPY, WITH BASKET STONE REMOVAL;  Surgeon: Florencio Hunting, MD;  Location: [facility] ORS;  Service: Urology;  Laterality: Left;   ENDARTERECTOMY FEMORAL Right 12/21/2020   Procedure: RIGHT FEMORAL ENDARTERECTOMY;  Surgeon: Adine Hoof, MD;  Location: [facility] OR;  Service: Vascular;  Laterality:  Right;   ENDARTERECTOMY FEMORAL Left 06/12/2023   Procedure: LEFT FEMORAL ENDARTERECTOMY WITH VEIN PATCH ANGIOPLASTY;  Surgeon: Adine Hoof, MD;  Location: [facility] OR;  Service: Vascular;  Laterality: Left;   EYE SURGERY Right 08/2014   Cataract; removed bilat   FEMORAL-POPLITEAL BYPASS GRAFT  07/19/2011   Procedure: BYPASS GRAFT FEMORAL-POPLITEAL ARTERY;  Surgeon: Palma Bob, MD;  Location: [facility] OR;  Service: Vascular;  Laterality: Right;  Right Femoral - Popliteal  Bypss with saphenous vein   FEMORAL-POPLITEAL BYPASS GRAFT Right 12/21/2020   Procedure: RIGHT FEMORAL-POPLITEAL ARTERY BYPASS GRAFT WITH PTFE;  Surgeon: Adine Hoof, MD;  Location: [facility] OR;  Service: Vascular;  Laterality: Right;   HERNIA REPAIR     right side,lft   INTRAOPERATIVE ARTERIOGRAM  07/19/2011   Procedure: INTRA OPERATIVE ARTERIOGRAM;  Surgeon: Palma Bob, MD;  Location: [facility] OR;  Service: Vascular;  Laterality: Right;   IR RADIOLOGIST EVAL & MGMT  02/14/2023   LOWER EXTREMITY ANGIOGRAPHY Bilateral 06/07/2023   Procedure: Lower Extremity Angiography;  Surgeon: Knox Perl, MD;  Location: [facility] INVASIVE CV LAB;  Service: Cardiovascular;  Laterality: Bilateral;   NM MYOVIEW  LTD  07/2011   Normal EF, very small area of basal-mid inferior ischemia; LOW RISK   PATCH ANGIOPLASTY Left 06/12/2023   Procedure: ANGIOPLASTY, USING VEIN PATCH;  Surgeon: Adine Hoof, MD;  Location: [facility] OR;  Service: Vascular;  Laterality: Left;   PENILE PROSTHESIS  REMOVAL     PENILE PROSTHESIS IMPLANT     PR VEIN BYPASS GRAFT,AORTO-FEM-POP  07/19/2011   Right  Fem/Pop BPG   PROSTATE BIOPSY     prostatectomy retropubic radical  07/15/2002   with nerve sparing, Gleason 3+4=7   Reclast   07/25/2013   RENAL DUPLEX  07/01/2012   Left renal artery 60-99% diameter reduction   TRANSTHORACIC ECHOCARDIOGRAM  01/2017   Normal EF and overall function   VEIN HARVEST Left 06/12/2023   Procedure: SURGICAL PROCUREMENT, VEIN;   Surgeon: Adine Hoof, MD;  Location: [facility] OR;  Service: Vascular;  Laterality: Left;    Short Social History:  Social History   Tobacco Use   Smoking status: Former    Current packs/day: 0.00    Average packs/day: 3.0 packs/day for 30.0 years (90.0 ttl pk-yrs)    Types: Cigarettes    Start date: 06/30/1972    Quit date: 07/01/2002    Years since quitting: 20.9   Smokeless tobacco: Never  Substance Use Topics   Alcohol use: No    Allergies  Allergen Reactions   Avelox [Moxifloxacin Hcl In Nacl] Diarrhea   Moxifloxacin Diarrhea and Other (See Comments)   Cilostazol  Diarrhea   Cyclobenzaprine Other (See Comments)   Quinolones     Other  reaction(s): Unknown   Ramipril Other (See Comments)    Other reaction(s): Unknown    Current Outpatient Medications  Medication Sig Dispense Refill   acetaminophen  (TYLENOL ) 500 MG tablet Take 1,000 mg by mouth every 6 (six) hours as needed for mild pain (pain score 1-3) or headache.     amLODipine  (NORVASC ) 2.5 MG tablet Take 1 tablet (2.5 mg total) by mouth at bedtime. 90 tablet 3   ascorbic acid  (VITAMIN C ) 500 MG tablet Take 500 mg by mouth daily.     aspirin  (ASPIRIN  CHILDRENS) 81 MG chewable tablet Chew 1 tablet (81 mg total) by mouth daily. (Patient taking differently: Chew 81 mg by mouth at bedtime.)     calcium -vitamin D (OSCAL WITH D) 500-5 MG-MCG tablet Take 1 tablet by mouth daily.     docusate sodium  (COLACE) 100 MG capsule Take 1 capsule (100 mg total) by mouth daily as needed for mild constipation.  0   fluticasone  (FLONASE ) 50 MCG/ACT nasal spray Place 2 sprays into both nostrils daily as needed for rhinitis or allergies (or nasal congestion).     latanoprost  (XALATAN ) 0.005 % ophthalmic solution Place 1 drop into the left eye at bedtime.      losartan -hydrochlorothiazide  (HYZAAR ) 50-12.5 MG tablet Take 1 tablet by mouth daily. 90 tablet 3   metoprolol  succinate (TOPROL -XL) 50 MG 24 hr tablet Take 1 tablet (50 mg total)  by mouth daily. Take with or immediately following a meal. (Patient taking differently: Take 50 mg by mouth at bedtime. Take with or immediately following a meal.) 90 tablet 3   Multiple Vitamin (MULTIVITAMIN WITH MINERALS) TABS tablet Take 1 tablet by mouth daily. 30 tablet 0   Multiple Vitamins-Minerals (LUTEIN-ZEAXANTHIN) TABS Take 1 tablet by mouth daily.     omeprazole (PRILOSEC) 20 MG capsule Take 20 mg by mouth daily as needed (acid reflux).     oxyCODONE  (OXY IR/ROXICODONE ) 5 MG immediate release tablet Take 1 tablet (5 mg total) by mouth every 6 (six) hours as needed for severe pain (pain score 7-10). 15 tablet 0   rosuvastatin  (CRESTOR ) 20 MG tablet Take 1 tablet (20 mg total) by mouth at bedtime. 30 tablet 11   vitamin B-12 (CYANOCOBALAMIN ) 500 MCG tablet Take 1 tablet (500 mcg total) by mouth daily. 30 tablet 0   Vitamin D, Ergocalciferol, (DRISDOL) 1.25 MG (50000 UNIT) CAPS capsule Take 50,000 Units by mouth every 7 (seven) days.     No current facility-administered medications for this visit.    REVIEW OF SYSTEMS  All other systems reviewed and are negative  Objective:   Objective   Vitals:   06/22/23 1047  BP: 110/68  Pulse: 71  Resp: 18  Temp: 98 F (36.7 C)  TempSrc: Temporal  SpO2: 99%  Weight: 165 lb 4.8 oz (75 kg)  Height: 5\' 8"  (1.727 m)   Body mass index is 25.13 kg/m.  Physical Exam General: no acute distress Cardiac: hemodynamically stable Extremities: Left groin wound with small area of opening at the inferior portion, minimal serosanguineous drainage, no erythema, no foul-smelling drainage.      Assessment/Plan:   BENEDICTO CAPOZZI is a 83 y.o. male with who underwent left common femoral, SFA and profunda endarterectomy with vein patch angioplasty on 06/12/2023.  His left groin incision appears to be healing well there is a very small area of opening at the inferior portion with some slight serosanguineous drainage.  I explained that this is not appear  infected and also discussed that  he has no prosthetic material that would be concern for deeper infection as a vein patch was used.  Encouraged to continue to place dry gauze to protect the area. Continue with urgent scheduled follow-up with noninvasive testing on 6 12/2023   Philipp Brawn MD [facility]

## 2023-06-22 NOTE — Telephone Encounter (Signed)
 Triage called regarding increase drainage from groin the patient had surgery with Dr.Cain on 06/12/23. Pt called yesterday and was told to monitor . Wife states drainage has gotten worse  and is worried it may have opened. Pt has new appointment today at 11:20 am for incision check .

## 2023-07-11 ENCOUNTER — Other Ambulatory Visit: Payer: Self-pay | Admitting: Physician Assistant

## 2023-07-11 ENCOUNTER — Ambulatory Visit: Attending: Vascular Surgery | Admitting: Physician Assistant

## 2023-07-11 VITALS — BP 126/78 | HR 70 | Temp 98.1°F | Resp 93 | Ht 68.0 in | Wt 167.2 lb

## 2023-07-11 DIAGNOSIS — I739 Peripheral vascular disease, unspecified: Secondary | ICD-10-CM

## 2023-07-11 DIAGNOSIS — I70213 Atherosclerosis of native arteries of extremities with intermittent claudication, bilateral legs: Secondary | ICD-10-CM

## 2023-07-11 NOTE — Progress Notes (Signed)
 POST OPERATIVE OFFICE NOTE    CC:  F/u for surgery  HPI:  This is a 83 y.o. male who is s/p left iliofemoral endarterectomy with vein patch angioplasty by Dr. Vikki Graves on 06/12/2023.  This was performed due to lifestyle limiting claudication.  He has a known left SFA occlusion.  He reports ambulating much better and without significant claudication symptoms.  His left groin incision has completely healed.  He is on aspirin  and statin daily.  Surgical history also significant for right leg bypass as well as redo bypass.  Allergies  Allergen Reactions   Avelox [Moxifloxacin Hcl In Nacl] Diarrhea   Moxifloxacin Diarrhea and Other (See Comments)   Cilostazol  Diarrhea   Cyclobenzaprine Other (See Comments)   Quinolones     Other reaction(s): Unknown   Ramipril Other (See Comments)    Other reaction(s): Unknown    Current Outpatient Medications  Medication Sig Dispense Refill   acetaminophen  (TYLENOL ) 500 MG tablet Take 1,000 mg by mouth every 6 (six) hours as needed for mild pain (pain score 1-3) or headache.     amLODipine  (NORVASC ) 2.5 MG tablet Take 1 tablet (2.5 mg total) by mouth at bedtime. 90 tablet 3   ascorbic acid  (VITAMIN C ) 500 MG tablet Take 500 mg by mouth daily.     aspirin  (ASPIRIN  CHILDRENS) 81 MG chewable tablet Chew 1 tablet (81 mg total) by mouth daily. (Patient taking differently: Chew 81 mg by mouth at bedtime.)     calcium -vitamin D (OSCAL WITH D) 500-5 MG-MCG tablet Take 1 tablet by mouth daily.     docusate sodium  (COLACE) 100 MG capsule Take 1 capsule (100 mg total) by mouth daily as needed for mild constipation.  0   fluticasone  (FLONASE ) 50 MCG/ACT nasal spray Place 2 sprays into both nostrils daily as needed for rhinitis or allergies (or nasal congestion).     latanoprost  (XALATAN ) 0.005 % ophthalmic solution Place 1 drop into the left eye at bedtime.      losartan -hydrochlorothiazide  (HYZAAR ) 50-12.5 MG tablet Take 1 tablet by mouth daily. 90 tablet 3   metoprolol   succinate (TOPROL -XL) 50 MG 24 hr tablet Take 1 tablet (50 mg total) by mouth daily. Take with or immediately following a meal. (Patient taking differently: Take 50 mg by mouth at bedtime. Take with or immediately following a meal.) 90 tablet 3   Multiple Vitamin (MULTIVITAMIN WITH MINERALS) TABS tablet Take 1 tablet by mouth daily. 30 tablet 0   Multiple Vitamins-Minerals (LUTEIN-ZEAXANTHIN) TABS Take 1 tablet by mouth daily.     omeprazole (PRILOSEC) 20 MG capsule Take 20 mg by mouth daily as needed (acid reflux).     oxyCODONE  (OXY IR/ROXICODONE ) 5 MG immediate release tablet Take 1 tablet (5 mg total) by mouth every 6 (six) hours as needed for severe pain (pain score 7-10). 15 tablet 0   rosuvastatin  (CRESTOR ) 20 MG tablet Take 1 tablet (20 mg total) by mouth at bedtime. 30 tablet 11   vitamin B-12 (CYANOCOBALAMIN ) 500 MCG tablet Take 1 tablet (500 mcg total) by mouth daily. 30 tablet 0   Vitamin D, Ergocalciferol, (DRISDOL) 1.25 MG (50000 UNIT) CAPS capsule Take 50,000 Units by mouth every 7 (seven) days.     No current facility-administered medications for this visit.     ROS:  See HPI  Physical Exam:  Vitals:   07/11/23 1246  BP: 126/78  Pulse: 70  Resp: (!) 93  Temp: 98.1 F (36.7 C)  TempSrc: Temporal  Weight: 167 lb  3.2 oz (75.8 kg)  Height: 5' 8 (1.727 m)    Incision:  L groin c/d/i Extremities:  palpable R DP pulse; L DP and PT by doppler Neuro: A&O  Assessment/Plan:  This is a 83 y.o. male who is s/p: Extensive left iliofemoral endarterectomy with vein patch angioplasty due to lifestyle limiting claudication  Left leg well-perfused by Doppler exam.  Left groin incision has completely healed.  He has a long segment SFA occlusion however no indication for bypass without rest pain or tissue loss.  He will continue his aspirin  and statin daily.  He will return in 6 months with right leg bypass duplex and ABI.   Cordie Deters, PA-C [facility] [phone]  Clinic MD:  Vikki Graves

## 2023-07-12 ENCOUNTER — Other Ambulatory Visit: Payer: Self-pay | Admitting: *Deleted

## 2023-07-12 DIAGNOSIS — I70213 Atherosclerosis of native arteries of extremities with intermittent claudication, bilateral legs: Secondary | ICD-10-CM

## 2023-07-12 DIAGNOSIS — I739 Peripheral vascular disease, unspecified: Secondary | ICD-10-CM

## 2023-07-17 ENCOUNTER — Encounter (HOSPITAL_COMMUNITY): Payer: Medicare HMO

## 2023-07-18 ENCOUNTER — Other Ambulatory Visit (HOSPITAL_COMMUNITY): Payer: Self-pay

## 2023-07-18 ENCOUNTER — Ambulatory Visit: Attending: Cardiology | Admitting: Cardiology

## 2023-07-18 ENCOUNTER — Encounter: Payer: Self-pay | Admitting: Cardiology

## 2023-07-18 VITALS — BP 122/62 | HR 62 | Resp 16 | Ht 68.0 in | Wt 166.2 lb

## 2023-07-18 DIAGNOSIS — E559 Vitamin D deficiency, unspecified: Secondary | ICD-10-CM

## 2023-07-18 DIAGNOSIS — I10 Essential (primary) hypertension: Secondary | ICD-10-CM | POA: Diagnosis not present

## 2023-07-18 DIAGNOSIS — E785 Hyperlipidemia, unspecified: Secondary | ICD-10-CM

## 2023-07-18 DIAGNOSIS — I70213 Atherosclerosis of native arteries of extremities with intermittent claudication, bilateral legs: Secondary | ICD-10-CM | POA: Diagnosis not present

## 2023-07-18 DIAGNOSIS — H401133 Primary open-angle glaucoma, bilateral, severe stage: Secondary | ICD-10-CM

## 2023-07-18 DIAGNOSIS — I6521 Occlusion and stenosis of right carotid artery: Secondary | ICD-10-CM

## 2023-07-18 DIAGNOSIS — I701 Atherosclerosis of renal artery: Secondary | ICD-10-CM

## 2023-07-18 MED ORDER — CLOPIDOGREL BISULFATE 75 MG PO TABS
75.0000 mg | ORAL_TABLET | Freq: Every day | ORAL | 3 refills | Status: AC
  Filled 2023-07-18: qty 20, 20d supply, fill #0
  Filled 2023-07-18: qty 70, 70d supply, fill #0

## 2023-07-18 NOTE — Progress Notes (Unsigned)
 Cardiology Office Note:  .   Date:  07/19/2023  ID:  Lance Dillon, DOB April 29, 1940, MRN [id number] PCP: Imelda Man, MD  [facility] Providers Cardiologist:  Knox Perl, MD   History of Present Illness: .   Lance Dillon is a 83 y.o. 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    Past medical history significant for hypertension, hyperlipidemia, chronic stage IIIa creatinine disease, chronic anemia and remote heavy tobacco use disorder, quit smoking in 2004, chronic right carotid occlusion, asymptomatic renal artery stenosis.  Due to severe symptomatic claudication, underwent peripheral arteriogram on 06/07/2023 and found to have a high-grade ostial profunda 99% stenosis in the long segment left SFA occlusion with one-vessel runoff hence no percutaneous option and was referred for femoral endarterectomy to improve collateral circulation.  He underwent successful but complex patch angioplasty and femoral endarterectomy 06/12/2023 and discharged 2 days later.  He now presents for follow-up. Discussed the use of AI scribe software for clinical note transcription with the patient, who gave verbal consent to proceed.  History of Present Illness IVAR Dillon is an 83 year old male with peripheral arterial disease who presents for follow-up after femoral endarterectomy and patch angioplasty.  He underwent a peripheral angiogram on Jun 07, 2023, revealing a long and complex left superficial femoral artery occlusion. A femoral endarterectomy and patch angioplasty using the  vein from the right leg were performed on Jun 12, 2023. He was hospitalized for two days post-surgery without complications.  Post-surgery, he reports significant improvement in ambulation, feeling 'a hundred percent better all over.' He uses a wheel walker and can walk up and down his cement driveway without issues. He resumed using his stationary bike, riding for 15-20 minutes. Soreness at the surgical site persists.  He takes aspirin  daily for peripheral arterial disease. Blood pressure is well-controlled with amlodipine  2.5 mg once daily and metoprolol  50 mg once daily. Cholesterol management with rosuvastatin  20 mg once daily shows optimal levels.  Labs   Lab Results  Component Value Date   CHOL 107 06/13/2023   HDL 42 06/13/2023   LDLCALC 48 06/13/2023   TRIG 83 06/13/2023   CHOLHDL 2.5 06/13/2023   Lab Results  Component Value Date   NA 137 06/14/2023   K 4.0 06/14/2023   CO2 26 06/14/2023   GLUCOSE 105 (H) 06/14/2023   BUN 30 (H) 06/14/2023   CREATININE 1.53 (H) 06/14/2023   CALCIUM  8.7 (L) 06/14/2023   EGFR 50 (L) 06/05/2023   GFRNONAA 45 (L) 06/14/2023      Latest Ref Rng & Units 06/14/2023    9:32 AM 06/13/2023    3:46 AM 06/12/2023   11:00 AM  BMP  Glucose 70 - 99 mg/dL 782  956    BUN 8 - 23 mg/dL 30  25    Creatinine 2.13 - 1.24 mg/dL 0.86  5.78  4.69   Sodium 135 - 145 mmol/L 137  138    Potassium 3.5 - 5.1 mmol/L 4.0  4.2    Chloride 98 - 111 mmol/L 101  105    CO2 22 - 32 mmol/L  26  22    Calcium  8.9 - 10.3 mg/dL 8.7  8.9        Latest Ref Rng & Units 06/13/2023    3:46 AM 06/12/2023   11:00 AM 06/12/2023    6:20 AM  CBC  WBC 4.0 - 10.5 K/uL 10.7  4.0  6.1   Hemoglobin 13.0 - 17.0 g/dL 52.8  41.3  24.4   Hematocrit 39.0 - 52.0 % 33.1  36.2  42.0   Platelets 150 - 400 K/uL 124  117  144     ROS  Review of Systems  Cardiovascular:  Negative for chest pain, dyspnea on exertion and leg swelling.   Physical Exam:   VS:  BP 122/62 (BP Location: Left  Arm, Patient Position: Sitting, Cuff Size: Normal)   Pulse 62   Resp 16   Ht 5' 8 (1.727 m)   Wt 166 lb 3.2 oz (75.4 kg)   SpO2 95%   BMI 25.27 kg/m    Wt Readings from Last 3 Encounters:  07/18/23 166 lb 3.2 oz (75.4 kg)  07/11/23 167 lb 3.2 oz (75.8 kg)  06/22/23 165 lb 4.8 oz (75 kg)    Physical Exam Neck:     Vascular: No carotid bruit or JVD.   Cardiovascular:     Rate and Rhythm: Normal rate and regular rhythm.     Pulses:          Carotid pulses are 2+ on the right side and 2+ on the left side.      Popliteal pulses are 0 on the right side and 0 on the left side.       Dorsalis pedis pulses are 0 on the right side and 0 on the left side.     Heart sounds: Normal heart sounds. No murmur heard.    No gallop.  Pulmonary:     Effort: Pulmonary effort is normal.     Breath sounds: Normal breath sounds.  Abdominal:     General: Bowel sounds are normal.     Palpations: Abdomen is soft.   Musculoskeletal:     Right lower leg: No edema.     Left lower leg: No edema.    Studies Reviewed: Aaron Aas    Peripheral arteriogram 06/07/2023:     Recommendation: Will discuss with vascular surgery whether profundoplasty and reassessing his symptoms versus femoral to below-knee peroneal bypass would be an option.  Patient has severe lifestyle-limiting articulation.  Further recommendations to follow  06/12/23 Vein patch angioplasty and atherectomy of CFA and DFA  EKG:         Medications ordered    Meds ordered this encounter  Medications   clopidogrel  (PLAVIX ) 75 MG tablet    Sig: Take 1 tablet (75 mg total) by mouth daily.    Dispense:  90 tablet    Refill:  3     ASSESSMENT AND PLAN: .      ICD-10-CM   1. Atherosclerosis of native artery of both lower extremities with intermittent claudication (HCC)  I70.213 clopidogrel  (PLAVIX ) 75 MG tablet    2. Primary hypertension  I10     3. Hyperlipidemia LDL goal <70  E78.5     4. Hypovitaminosis D  E55.9     5. Left renal  artery stenosis - 70% by angiography; increased velocity on recent Dopplers (60-99%)  I70.1     6. Carotid occlusion, right  I65.21     7. Primary open angle glaucoma of both eyes, severe  stage  F62.1308      Assessment & Plan Peripheral artery disease Peripheral artery disease with left SFA occlusion. Recent femoral endarterectomy and patch angioplasty on 06/12/2023. Post-operative recovery is progressing well with no complications. Surgical site is healing well with good femoral pulse and capillary refill. Transitioning from aspirin  to clopidogrel  to reduce bleeding risk and maintain vessel patency. - Stop aspirin  - Start clopidogrel  (Plavix ) - Schedule ABI as a baseline  Hypertension Hypertension is well-controlled with amlodipine  and metoprolol .  Hyperlipidemia Hyperlipidemia is well-managed with rosuvastatin . Recent cholesterol panel shows LDL at 48, which is optimal.  Gastroesophageal reflux disease (GERD) GERD managed with omeprazole as needed. No frequent use reported.  Hence no need to change to Protonix  in view of Plavix  being loaded.  Vitamin D supplementation Vitamin D supplementation with 50,000 units weekly. Discussion about potential hypervitaminosis D and the need to check vitamin D levels. Advised to discuss management with Dr. Lucina Sabal. - Check vitamin D levels at next blood work - Discuss vitamin D management with Dr. Lucina Sabal  Office visit in 6 months and if he remains stable on annual basis.   Signed,  Knox Perl, MD, [facility] 07/19/2023, 2:44 AM [facility] [address] [place], [place] [address] Phone: [phone]. Fax:  [phone]

## 2023-07-18 NOTE — Patient Instructions (Addendum)
 Medication Instructions:  Stop aspirin  Start Clopidogrel  75 mg by mouth daily   *If you need a refill on your cardiac medications before your next appointment, please call your pharmacy*  Lab Work: none If you have labs (blood work) drawn today and your tests are completely normal, you will receive your results only by: MyChart Message (if you have MyChart) OR A paper copy in the mail If you have any lab test that is abnormal or we need to change your treatment, we will call you to review the results.  Testing/Procedures: none  Follow-Up: At [facility], you and your health needs are our priority.  As part of our continuing mission to provide you with exceptional heart care, our providers are all part of one team.  This team includes your primary Cardiologist (physician) and Advanced Practice Providers or APPs (Physician Assistants and Nurse Practitioners) who all work together to provide you with the care you need, when you need it.  Your next appointment:   6 month(s)  Provider:   Knox Perl, MD    We recommend signing up for the patient portal called MyChart.  Sign up information is provided on this After Visit Summary.  MyChart is used to connect with patients for Virtual Visits (Telemedicine).  Patients are able to view lab/test results, encounter notes, upcoming appointments, etc.  Non-urgent messages can be sent to your provider as well.   To learn more about what you can do with MyChart, go to [email or web address].   Other Instructions

## 2023-07-19 ENCOUNTER — Encounter: Payer: Self-pay | Admitting: Cardiology

## 2023-07-27 ENCOUNTER — Ambulatory Visit: Payer: Medicare HMO | Admitting: Cardiology

## 2023-07-27 ENCOUNTER — Ambulatory Visit: Admitting: Cardiology

## 2023-07-28 ENCOUNTER — Encounter (HOSPITAL_COMMUNITY): Payer: Self-pay | Admitting: Interventional Radiology

## 2023-08-14 ENCOUNTER — Encounter: Payer: Self-pay | Admitting: Podiatry

## 2023-08-14 ENCOUNTER — Ambulatory Visit: Admitting: Podiatry

## 2023-08-14 DIAGNOSIS — I739 Peripheral vascular disease, unspecified: Secondary | ICD-10-CM | POA: Diagnosis not present

## 2023-08-14 DIAGNOSIS — M79675 Pain in left toe(s): Secondary | ICD-10-CM

## 2023-08-14 DIAGNOSIS — B351 Tinea unguium: Secondary | ICD-10-CM | POA: Diagnosis not present

## 2023-08-14 DIAGNOSIS — S98131S Complete traumatic amputation of one right lesser toe, sequela: Secondary | ICD-10-CM

## 2023-08-14 DIAGNOSIS — M79674 Pain in right toe(s): Secondary | ICD-10-CM

## 2023-08-14 NOTE — Progress Notes (Signed)
 This patient presents  to my office for at risk foot care.  This patient requires this care by a professional since this patient will be at risk due to having PVD and kidney disease.  He has amputation fourth toe right foot.This patient is unable to cut nails himself since the patient cannot reach his nails.These nails are painful walking and wearing shoes.  This patient presents for at risk foot care today.  General Appearance  Alert, conversant and in no acute stress.  Vascular  Dorsalis pedis and posterior tibial  pulses are  weakly/absent palpable  bilaterally.  Capillary return is within normal limits  bilaterally. Temperature is within normal limits  bilaterally.  Neurologic  Senn-Weinstein monofilament wire test within normal limits  bilaterally. Muscle power within normal limits bilaterally.  Nails Thick disfigured discolored nails with subungual debris  hallux nails bilaterally. No evidence of bacterial infection or drainage bilaterally.  Orthopedic  No limitations of motion  feet .  No crepitus or effusions noted.  No bony pathology or digital deformities noted. Amputation fourth toe right foot.  Skin  normotropic skin with no porokeratosis noted bilaterally.  No signs of infections or ulcers noted.     Onychomycosis  Pain in right toes  Pain in left toes  Consent was obtained for treatment procedures.   Mechanical debridement of nails  bilaterally performed with a nail nipper.  Filed with dremel without incident.    Return office visit     3 months                Told patient to return for periodic foot care and evaluation due to potential at risk complications.   Helane Gunther DPM

## 2023-08-24 DIAGNOSIS — M810 Age-related osteoporosis without current pathological fracture: Secondary | ICD-10-CM | POA: Diagnosis not present

## 2023-08-24 DIAGNOSIS — E785 Hyperlipidemia, unspecified: Secondary | ICD-10-CM | POA: Diagnosis not present

## 2023-08-24 DIAGNOSIS — I10 Essential (primary) hypertension: Secondary | ICD-10-CM | POA: Diagnosis not present

## 2023-08-24 LAB — LAB REPORT - SCANNED: EGFR: 47

## 2023-08-29 DIAGNOSIS — N261 Atrophy of kidney (terminal): Secondary | ICD-10-CM | POA: Diagnosis not present

## 2023-08-29 DIAGNOSIS — I10 Essential (primary) hypertension: Secondary | ICD-10-CM | POA: Diagnosis not present

## 2023-08-29 DIAGNOSIS — J309 Allergic rhinitis, unspecified: Secondary | ICD-10-CM | POA: Diagnosis not present

## 2023-08-29 DIAGNOSIS — C61 Malignant neoplasm of prostate: Secondary | ICD-10-CM | POA: Diagnosis not present

## 2023-08-29 DIAGNOSIS — Z0000 Encounter for general adult medical examination without abnormal findings: Secondary | ICD-10-CM | POA: Diagnosis not present

## 2023-08-29 DIAGNOSIS — N1831 Chronic kidney disease, stage 3a: Secondary | ICD-10-CM | POA: Diagnosis not present

## 2023-08-29 DIAGNOSIS — I6523 Occlusion and stenosis of bilateral carotid arteries: Secondary | ICD-10-CM | POA: Diagnosis not present

## 2023-08-29 DIAGNOSIS — N200 Calculus of kidney: Secondary | ICD-10-CM | POA: Diagnosis not present

## 2023-08-29 DIAGNOSIS — C775 Secondary and unspecified malignant neoplasm of intrapelvic lymph nodes: Secondary | ICD-10-CM | POA: Diagnosis not present

## 2023-08-29 DIAGNOSIS — J439 Emphysema, unspecified: Secondary | ICD-10-CM | POA: Diagnosis not present

## 2023-08-29 DIAGNOSIS — I739 Peripheral vascular disease, unspecified: Secondary | ICD-10-CM | POA: Diagnosis not present

## 2023-08-29 DIAGNOSIS — M810 Age-related osteoporosis without current pathological fracture: Secondary | ICD-10-CM | POA: Diagnosis not present

## 2023-10-10 DIAGNOSIS — C61 Malignant neoplasm of prostate: Secondary | ICD-10-CM | POA: Diagnosis not present

## 2023-10-11 ENCOUNTER — Other Ambulatory Visit (HOSPITAL_COMMUNITY): Payer: Self-pay

## 2023-10-12 DIAGNOSIS — Z23 Encounter for immunization: Secondary | ICD-10-CM | POA: Diagnosis not present

## 2023-10-17 DIAGNOSIS — C61 Malignant neoplasm of prostate: Secondary | ICD-10-CM | POA: Diagnosis not present

## 2023-10-18 DIAGNOSIS — M810 Age-related osteoporosis without current pathological fracture: Secondary | ICD-10-CM | POA: Diagnosis not present

## 2023-10-18 DIAGNOSIS — I739 Peripheral vascular disease, unspecified: Secondary | ICD-10-CM | POA: Diagnosis not present

## 2023-10-18 DIAGNOSIS — I10 Essential (primary) hypertension: Secondary | ICD-10-CM | POA: Diagnosis not present

## 2023-10-18 DIAGNOSIS — E785 Hyperlipidemia, unspecified: Secondary | ICD-10-CM | POA: Diagnosis not present

## 2023-10-18 DIAGNOSIS — E559 Vitamin D deficiency, unspecified: Secondary | ICD-10-CM | POA: Diagnosis not present

## 2023-10-22 DIAGNOSIS — Z961 Presence of intraocular lens: Secondary | ICD-10-CM | POA: Diagnosis not present

## 2023-10-22 DIAGNOSIS — H5211 Myopia, right eye: Secondary | ICD-10-CM | POA: Diagnosis not present

## 2023-10-22 DIAGNOSIS — H353133 Nonexudative age-related macular degeneration, bilateral, advanced atrophic without subfoveal involvement: Secondary | ICD-10-CM | POA: Diagnosis not present

## 2023-10-22 DIAGNOSIS — H52202 Unspecified astigmatism, left eye: Secondary | ICD-10-CM | POA: Diagnosis not present

## 2023-10-22 DIAGNOSIS — H524 Presbyopia: Secondary | ICD-10-CM | POA: Diagnosis not present

## 2023-10-22 DIAGNOSIS — H401121 Primary open-angle glaucoma, left eye, mild stage: Secondary | ICD-10-CM | POA: Diagnosis not present

## 2023-10-25 ENCOUNTER — Telehealth: Payer: Self-pay | Admitting: Family Medicine

## 2023-10-25 NOTE — Telephone Encounter (Signed)
 Forwarding to Dr. Denyse Amass to review and advise.

## 2023-10-25 NOTE — Telephone Encounter (Signed)
 Patient is having bilateral hip pain and thinking a cortisone injection would be helpful. He is scheduled to see Dr Joane on Wednesday, 10/1 but is scheduled for a Reclast  infusion on 9/30. Would these interfere with each other?  Please advise.

## 2023-10-26 NOTE — Telephone Encounter (Signed)
 I do not think Reclast  infusion will interfere with steroid injection or vice versa.

## 2023-10-31 ENCOUNTER — Ambulatory Visit: Admitting: Family Medicine

## 2023-10-31 ENCOUNTER — Ambulatory Visit

## 2023-10-31 ENCOUNTER — Other Ambulatory Visit: Payer: Self-pay

## 2023-10-31 VITALS — BP 126/84 | HR 48 | Ht 68.0 in

## 2023-10-31 DIAGNOSIS — M47816 Spondylosis without myelopathy or radiculopathy, lumbar region: Secondary | ICD-10-CM | POA: Diagnosis not present

## 2023-10-31 DIAGNOSIS — M25531 Pain in right wrist: Secondary | ICD-10-CM

## 2023-10-31 DIAGNOSIS — M19041 Primary osteoarthritis, right hand: Secondary | ICD-10-CM | POA: Diagnosis not present

## 2023-10-31 DIAGNOSIS — G8929 Other chronic pain: Secondary | ICD-10-CM | POA: Diagnosis not present

## 2023-10-31 DIAGNOSIS — M5126 Other intervertebral disc displacement, lumbar region: Secondary | ICD-10-CM | POA: Diagnosis not present

## 2023-10-31 DIAGNOSIS — M79641 Pain in right hand: Secondary | ICD-10-CM | POA: Diagnosis not present

## 2023-10-31 DIAGNOSIS — M545 Low back pain, unspecified: Secondary | ICD-10-CM

## 2023-10-31 DIAGNOSIS — M25559 Pain in unspecified hip: Secondary | ICD-10-CM | POA: Diagnosis not present

## 2023-10-31 DIAGNOSIS — M5127 Other intervertebral disc displacement, lumbosacral region: Secondary | ICD-10-CM | POA: Diagnosis not present

## 2023-10-31 DIAGNOSIS — M1811 Unilateral primary osteoarthritis of first carpometacarpal joint, right hand: Secondary | ICD-10-CM | POA: Diagnosis not present

## 2023-10-31 DIAGNOSIS — M5136 Other intervertebral disc degeneration, lumbar region with discogenic back pain only: Secondary | ICD-10-CM | POA: Diagnosis not present

## 2023-10-31 NOTE — Progress Notes (Signed)
 LILLETTE Ileana Collet, PhD, LAT, ATC acting as a scribe for Artist Lloyd, MD.  Lance Dillon is a 83 y.o. male who presents to [facility] at [facility] today for bilat hip pain. Pt was previously seen by Dr. Lloyd on 08/18/23 for LBP.  Today, pt c/o bilat hip pain ongoing since Wed morning last week. Pt locates pain to bilat buttock w/ radiating pain into the posterior aspect of both thighs.   Radiates: yes Aggravates: standing, walking Treatments tried: HEP, Tylenol   He also c/o R wrist pain x 2 wks. Pain started after trying to open a jar for his wife. Pt locates pain to the thenar eminence.  Pertinent review of systems: No fevers or chills  Relevant historical information: Bivigam back pain   Exam:  BP 126/84   Pulse (!) 48   Ht 5' 8 (1.727 m)   SpO2 98%   BMI 25.27 kg/m  General: Well Developed, well nourished, and in no acute distress.   MSK: L-spine decreased lumbar motion.  Hips bilaterally normal.  Normal motion.  Tender palpation ischial tuberosity bilateral hips.  Right hand bossing present at first [facility].  Tender palpation in this region.    Lab and Radiology Results  Procedure: Real-time Ultrasound Guided Injection of left ischial tuberosity Device: Philips Affiniti 50G/GE Logiq Images permanently stored and available for review in PACS Verbal informed consent obtained.  Discussed risks and benefits of procedure. Warned about infection, bleeding, hyperglycemia damage to structures among others. Patient expresses understanding and agreement Time-out conducted.   Noted no overlying erythema, induration, or other signs of local infection.   Skin prepped in a sterile fashion.   Local anesthesia: Topical Ethyl chloride.   With sterile technique and under real time ultrasound guidance: 40 mg of Kenalog  and 1 mL of lidocaine  injected into ischial tuberosity bursa. Fluid seen entering the ischial tuberosity bursa.   Completed without difficulty    Pain immediately resolved suggesting accurate placement of the medication.   Advised to call if fevers/chills, erythema, induration, drainage, or persistent bleeding.   Images permanently stored and available for review in the ultrasound unit.  Impression: Technically successful ultrasound guided injection.    Procedure: Real-time Ultrasound Guided Injection of right ischial tuberosity bursa Device: Philips Affiniti 50G/GE Logiq Images permanently stored and available for review in PACS Verbal informed consent obtained.  Discussed risks and benefits of procedure. Warned about infection, bleeding, hyperglycemia damage to structures among others. Patient expresses understanding and agreement Time-out conducted.   Noted no overlying erythema, induration, or other signs of local infection.   Skin prepped in a sterile fashion.   Local anesthesia: Topical Ethyl chloride.   With sterile technique and under real time ultrasound guidance: 40 mg of Kenalog  and 1 mL of lidocaine  injected into right ischial tuberosity bursa. Fluid seen entering the bursa.   Completed without difficulty   Pain immediately resolved suggesting accurate placement of the medication.   Advised to call if fevers/chills, erythema, induration, drainage, or persistent bleeding.   Images permanently stored and available for review in the ultrasound unit.  Impression: Technically successful ultrasound guided injection.    X-ray images lumbar spine and right hand obtained today personally and independently interpreted.  Lumbar spine: Multilevel degenerative changes.  No acute fracture is visible.  Scoliosis is present.  Right hand: No acute fractures.  Degenerative changes present first [facility].  Await formal radiology review    Assessment and Plan: 83 y.o. male with chronic posterior hip  pain extending from the low back.  This could be lumbar radiculopathy.  Patient certainly does have multiple areas of spinal nerve root  impingement on MRI lumbar spine from August 2023.   Pain today is located in the ischial tuberosities and potentially originating from the back.  Plan for ischial tuberosity bursa injection bilaterally and x-ray lumbar spine.  Consider MRI lumbar spine if needed.  Additionally he notes some right thumb pain.  X-ray does show degenerative changes.  I will check back in about 2 weeks and we can consider doing an injection there if needed.  In the meantime we will work on Voltaren gel and home exercise program.   PDMP not reviewed this encounter. Orders Placed This Encounter  Procedures   US  LIMITED JOINT SPACE STRUCTURES UP RIGHT(NO LINKED CHARGES)    Reason for Exam (SYMPTOM  OR DIAGNOSIS REQUIRED):   right wrist pain    Preferred imaging location?:   [facility] Hand Complete Right    Standing Status:   Future    Number of Occurrences:   1    Expiration Date:   12/01/2023    Reason for Exam (SYMPTOM  OR DIAGNOSIS REQUIRED):   right hand pain    Preferred imaging location?:   [facility]   DG Lumbar Spine 2-3 Views    Standing Status:   Future    Number of Occurrences:   1    Expiration Date:   12/01/2023    Reason for Exam (SYMPTOM  OR DIAGNOSIS REQUIRED):   low back pain    Preferred imaging location?:   [facility]   No orders of the defined types were placed in this encounter.    Discussed warning signs or symptoms. Please see discharge instructions. Patient expresses understanding.  The above documentation has been reviewed and is accurate and complete Artist Lloyd, M.D.

## 2023-10-31 NOTE — Patient Instructions (Addendum)
 Thank you for coming in today.   You received an injection today. Seek immediate medical attention if the joint becomes red, extremely painful, or is oozing fluid.   Please get an Xray today before you leave   Check back in 2 weeks

## 2023-11-06 ENCOUNTER — Ambulatory Visit: Payer: Self-pay | Admitting: Family Medicine

## 2023-11-06 NOTE — Progress Notes (Signed)
 Low back x-ray shows severe arthritis areas.

## 2023-11-06 NOTE — Progress Notes (Signed)
 Right hand x-ray shows arthritis in the hand.Lance Dillon

## 2023-11-12 ENCOUNTER — Encounter: Payer: Self-pay | Admitting: Podiatry

## 2023-11-12 ENCOUNTER — Ambulatory Visit: Admitting: Podiatry

## 2023-11-12 DIAGNOSIS — B351 Tinea unguium: Secondary | ICD-10-CM

## 2023-11-12 DIAGNOSIS — M79675 Pain in left toe(s): Secondary | ICD-10-CM | POA: Diagnosis not present

## 2023-11-12 DIAGNOSIS — I739 Peripheral vascular disease, unspecified: Secondary | ICD-10-CM

## 2023-11-12 DIAGNOSIS — M79674 Pain in right toe(s): Secondary | ICD-10-CM

## 2023-11-12 DIAGNOSIS — S98131S Complete traumatic amputation of one right lesser toe, sequela: Secondary | ICD-10-CM

## 2023-11-12 NOTE — Progress Notes (Signed)
 This patient presents  to my office for at risk foot care.  This patient requires this care by a professional since this patient will be at risk due to having PVD and kidney disease.  He has amputation fourth toe right foot.This patient is unable to cut nails himself since the patient cannot reach his nails.These nails are painful walking and wearing shoes.  This patient presents for at risk foot care today.  General Appearance  Alert, conversant and in no acute stress.  Vascular  Dorsalis pedis and posterior tibial  pulses are  weakly/absent palpable  bilaterally.  Capillary return is within normal limits  bilaterally. Temperature is within normal limits  bilaterally.  Neurologic  Senn-Weinstein monofilament wire test within normal limits  bilaterally. Muscle power within normal limits bilaterally.  Nails Thick disfigured discolored nails with subungual debris  hallux nails bilaterally. No evidence of bacterial infection or drainage bilaterally.  Orthopedic  No limitations of motion  feet .  No crepitus or effusions noted.  No bony pathology or digital deformities noted. Amputation fourth toe right foot.  Skin  normotropic skin with no porokeratosis noted bilaterally.  No signs of infections or ulcers noted.     Onychomycosis  Pain in right toes  Pain in left toes  Consent was obtained for treatment procedures.   Mechanical debridement of nails  bilaterally performed with a nail nipper.  Filed with dremel without incident.    Return office visit     3 months                Told patient to return for periodic foot care and evaluation due to potential at risk complications.   Helane Gunther DPM

## 2023-11-14 ENCOUNTER — Ambulatory Visit: Admitting: Family Medicine

## 2023-11-14 ENCOUNTER — Encounter: Payer: Self-pay | Admitting: Family Medicine

## 2023-11-14 ENCOUNTER — Other Ambulatory Visit: Payer: Self-pay

## 2023-11-14 VITALS — BP 118/72 | HR 67 | Ht 68.0 in | Wt 164.0 lb

## 2023-11-14 DIAGNOSIS — M25551 Pain in right hip: Secondary | ICD-10-CM

## 2023-11-14 DIAGNOSIS — M25552 Pain in left hip: Secondary | ICD-10-CM | POA: Diagnosis not present

## 2023-11-14 NOTE — Progress Notes (Signed)
 I, Lance Dillon, CMA acting as a scribe for Artist Lloyd, MD.  Lance Dillon is a 83 y.o. male who presents to [facility] at [facility] today for 2-wk f/u R wrist, bilat posterior hip, and low back pain. Pt was last seen by Dr. Lloyd on 10/31/23 and was given bilat ischial tuberosity steroid injection. Also advised to use Voltaren gel and HEP.  Today, pt reports improvement of lower back and hip sx with last injection. Has been compliant with HEP. Some continued pain in the upper gluteal region. Ambulating with a cane today. Notes continued right wrist pain, some improvement sine last visit. Feels that he has been walking better.   His ischial tuberosity pain is improved he notes some pain in the SI joint regions bilaterally as well and is interested in injections in that area.  Dx imaging: 10/31/23 L-spine & R hand XR  Pertinent review of systems: No fevers or chills  Relevant historical information: Hypertension   Exam:  BP 118/72   Pulse 67   Ht 5' 8 (1.727 m)   Wt 164 lb (74.4 kg)   SpO2 92%   BMI 24.94 kg/m  General: Well Developed, well nourished, and in no acute distress.   MSK: L-spine nontender to palpation midline tender palpation bilateral SI joint region.    Lab and Radiology Results  Procedure: Real-time Ultrasound Guided Injection of right SI joint Device: Philips Affiniti 50G/GE Logiq Images permanently stored and available for review in PACS Verbal informed consent obtained.  Discussed risks and benefits of procedure. Warned about infection, bleeding, hyperglycemia damage to structures among others. Patient expresses understanding and agreement Time-out conducted.   Noted no overlying erythema, induration, or other signs of local infection.   Skin prepped in a sterile fashion.   Local anesthesia: Topical Ethyl chloride.   With sterile technique and under real time ultrasound guidance: 40 mg of Kenalog  and 2 mL eaters of Marcaine   injected into right SI joint. Fluid seen entering the joint capsule.   Completed without difficulty   Pain immediately resolved suggesting accurate placement of the medication.   Advised to call if fevers/chills, erythema, induration, drainage, or persistent bleeding.   Images permanently stored and available for review in the ultrasound unit.  Impression: Technically successful ultrasound guided injection.  Procedure: Real-time Ultrasound Guided Injection of left SI joint Device: Philips Affiniti 50G/GE Logiq Images permanently stored and available for review in PACS Verbal informed consent obtained.  Discussed risks and benefits of procedure. Warned about infection, bleeding, hyperglycemia damage to structures among others. Patient expresses understanding and agreement Time-out conducted.   Noted no overlying erythema, induration, or other signs of local infection.   Skin prepped in a sterile fashion.   Local anesthesia: Topical Ethyl chloride.   With sterile technique and under real time ultrasound guidance: 48 mg of Kenalog  and 2 mL of Marcaine  injected into SI joint. Fluid seen entering the joint capsule.   Completed without difficulty   Pain immediately resolved suggesting accurate placement of the medication.   Advised to call if fevers/chills, erythema, induration, drainage, or persistent bleeding.   Images permanently stored and available for review in the ultrasound unit.  Impression: Technically successful ultrasound guided injection.          Assessment and Plan: 83 y.o. male with bilateral low back pain with pain around the SI joint region.  Plan for targeted injection of that area today.  Continue home exercise program and check back as  needed.   PDMP not reviewed this encounter. Orders Placed This Encounter  Procedures   US  LIMITED JOINT SPACE STRUCTURES LOW BILAT(NO LINKED CHARGES)    Reason for Exam (SYMPTOM  OR DIAGNOSIS REQUIRED):   bilat hip pain     Preferred imaging location?:   [facility]   No orders of the defined types were placed in this encounter.    Discussed warning signs or symptoms. Please see discharge instructions. Patient expresses understanding.   The above documentation has been reviewed and is accurate and complete Artist Lloyd, M.D.

## 2023-11-14 NOTE — Patient Instructions (Addendum)
 Thank you for coming in today.   You received an injection today. Seek immediate medical attention if the joint becomes red, extremely painful, or is oozing fluid.   See you back as needed.

## 2023-12-04 DIAGNOSIS — Z03818 Encounter for observation for suspected exposure to other biological agents ruled out: Secondary | ICD-10-CM | POA: Diagnosis not present

## 2023-12-04 DIAGNOSIS — R0981 Nasal congestion: Secondary | ICD-10-CM | POA: Diagnosis not present

## 2023-12-04 DIAGNOSIS — R051 Acute cough: Secondary | ICD-10-CM | POA: Diagnosis not present

## 2024-01-09 ENCOUNTER — Ambulatory Visit (HOSPITAL_COMMUNITY): Admission: RE | Admit: 2024-01-09 | Discharge: 2024-01-09 | Attending: Surgery | Admitting: Surgery

## 2024-01-09 ENCOUNTER — Ambulatory Visit (HOSPITAL_COMMUNITY)
Admission: RE | Admit: 2024-01-09 | Discharge: 2024-01-09 | Disposition: A | Discharge status: Home / Self Care | Source: Ambulatory Visit | Attending: Surgery | Admitting: Surgery

## 2024-01-09 ENCOUNTER — Ambulatory Visit (INDEPENDENT_AMBULATORY_CARE_PROVIDER_SITE_OTHER): Admitting: Physician Assistant

## 2024-01-09 VITALS — BP 121/76 | HR 76 | Temp 97.7°F | Wt 163.8 lb

## 2024-01-09 DIAGNOSIS — I70213 Atherosclerosis of native arteries of extremities with intermittent claudication, bilateral legs: Secondary | ICD-10-CM

## 2024-01-09 DIAGNOSIS — I739 Peripheral vascular disease, unspecified: Secondary | ICD-10-CM

## 2024-01-09 LAB — VAS US ABI WITH/WO TBI
Left ABI: 0.75
Right ABI: 0.97

## 2024-01-09 NOTE — Progress Notes (Signed)
 Office Note     CC:  follow up Requesting Provider:  Clarice Nottingham, MD  HPI: Lance Dillon is a 83 y.o. (1940/07/20) male who presents for surveillance of PAD.  Surgical history significant for right leg bypass as well as redo bypass.  He has also had a left iliofemoral endarterectomy with vein patch angioplasty by Dr. Sheree on 06/12/2023 due to lifestyle-limiting claudication.  He has a known left SFA occlusion.  He continues to be without any significant claudication.  He is also without rest pain or tissue loss.  He admittedly does not walk much for exercise.  He is ambulatory with a cane however he is limited by back and hip pain related to osteoarthritis.  He is on Plavix  and statin daily.  He is a former smoker.   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   Bilateral: Noted on MRA of brain in November 2015 with collateral flow from posterior circulation and external collaterals reconstituting anterior circulation   Chronic kidney disease    Colon polyps 2004, 07, 14   COPD (chronic obstructive pulmonary disease) (HCC)    COVID-19 virus infection 02/10/2022   GERD (gastroesophageal reflux disease)    diet controlled, no meds   Glaucoma    left eye    Hx of radiation therapy 03/22/11 to 05/08/11   PSA recurrent carcinoma of prostate   Hypercholesterolemia    Hypertension    Macular degeneration of both eyes    MRSA infection greater than 3 months ago    2004   Peripheral vascular disease 07/2011   Bilateral SFA stenosis: Status post right femoropopliteal bypass - Dr. 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 mm fusiform aneurysm    Prostate cancer (HCC) 2004   Renal artery stenosis    70% left   Stones in the urinary tract    Stroke [facility])    Urinary  incontinence     Past Surgical History:  Procedure Laterality Date   ABDOMINAL AORTAGRAM N/A 06/20/2011   Procedure: ABDOMINAL EZELLA;  Surgeon: Gaile LELON New, MD;  Location: [facility] CATH LAB;  Service: Cardiovascular;  Laterality: N/A;   ABDOMINAL AORTOGRAM W/LOWER EXTREMITY N/A 12/20/2020   Procedure: ABDOMINAL AORTOGRAM W/LOWER EXTREMITY;  Surgeon: Sheree Penne Bruckner, MD;  Location: [facility] INVASIVE CV LAB;  Service: Cardiovascular;  Laterality: N/A;   AMPUTATION Right 12/28/2020   Procedure: RIGHT 4TH TOE AMPUTATION;  Surgeon: Sheree Penne Bruckner, MD;  Location: [facility] OR;  Service: Vascular;  Laterality: Right;   CARDIOVASCULAR STRESS TEST  07/06/2011   Evidence of mild ischemia in Basal Inferior and Mid Inferior regions, no significant wall motion abnormalities noted.   clamp to penis     for urinary control   COLONOSCOPY     COLONOSCOPY W/ BIOPSIES     multiple    CYSTOSCOPY W/ URETERAL STENT PLACEMENT Left 06/07/2015   Procedure: CYSTOSCOPY, RETROGRADE PYLEGRAM  WITH STENT PLACEMENT;  Surgeon: Glendia Elizabeth, MD;  Location: [facility] ORS;  Service: Urology;  Laterality: Left;   CYSTOSCOPY WITH RETROGRADE PYELOGRAM, URETEROSCOPY AND STENT PLACEMENT Left 07/28/2015   Procedure: CYSTOSCOPY, URETEROSCOPY, WITH BASKET STONE REMOVAL;  Surgeon: Gretel Ferrara, MD;  Location: [facility] ORS;  Service: Urology;  Laterality: Left;   ENDARTERECTOMY FEMORAL  Right 12/21/2020   Procedure: RIGHT FEMORAL ENDARTERECTOMY;  Surgeon: Sheree Penne Bruckner, MD;  Location: [facility] OR;  Service: Vascular;  Laterality: Right;   ENDARTERECTOMY FEMORAL Left 06/12/2023   Procedure: LEFT FEMORAL ENDARTERECTOMY WITH VEIN PATCH ANGIOPLASTY;  Surgeon: Sheree Penne Bruckner, MD;  Location: [facility] OR;  Service: Vascular;  Laterality: Left;   EYE SURGERY Right 08/2014   Cataract; removed bilat   FEMORAL-POPLITEAL BYPASS GRAFT  07/19/2011   Procedure: BYPASS GRAFT FEMORAL-POPLITEAL ARTERY;  Surgeon: Lynwood JONETTA Collum, MD;   Location: [facility] OR;  Service: Vascular;  Laterality: Right;  Right Femoral - Popliteal  Bypss with saphenous vein   FEMORAL-POPLITEAL BYPASS GRAFT Right 12/21/2020   Procedure: RIGHT FEMORAL-POPLITEAL ARTERY BYPASS GRAFT WITH PTFE;  Surgeon: Sheree Penne Bruckner, MD;  Location: [facility] OR;  Service: Vascular;  Laterality: Right;   HERNIA REPAIR     right side,lft   INTRAOPERATIVE ARTERIOGRAM  07/19/2011   Procedure: INTRA OPERATIVE ARTERIOGRAM;  Surgeon: Lynwood JONETTA Collum, MD;  Location: [facility] OR;  Service: Vascular;  Laterality: Right;   IR RADIOLOGIST EVAL & MGMT  02/14/2023   LOWER EXTREMITY ANGIOGRAPHY Bilateral 06/07/2023   Procedure: Lower Extremity Angiography;  Surgeon: Ladona Heinz, MD;  Location: [facility] INVASIVE CV LAB;  Service: Cardiovascular;  Laterality: Bilateral;   NM MYOVIEW  LTD  07/2011   Normal EF, very small area of basal-mid inferior ischemia; LOW RISK   PATCH ANGIOPLASTY Left 06/12/2023   Procedure: ANGIOPLASTY, USING VEIN PATCH;  Surgeon: Sheree Penne Bruckner, MD;  Location: [facility] OR;  Service: Vascular;  Laterality: Left;   PENILE PROSTHESIS  REMOVAL     PENILE PROSTHESIS IMPLANT     PR VEIN BYPASS GRAFT,AORTO-FEM-POP  07/19/2011   Right  Fem/Pop BPG   PROSTATE BIOPSY     prostatectomy retropubic radical  07/15/2002   with nerve sparing, Gleason 3+4=7   Reclast   07/25/2013   RENAL DUPLEX  07/01/2012   Left renal artery 60-99% diameter reduction   TRANSTHORACIC ECHOCARDIOGRAM  01/2017   Normal EF and overall function   VEIN HARVEST Left 06/12/2023   Procedure: SURGICAL PROCUREMENT, VEIN;  Surgeon: Sheree Penne Bruckner, MD;  Location: [facility] OR;  Service: Vascular;  Laterality: Left;    Social History   Socioeconomic History   Marital status: Married    Spouse name: Not on file   Number of children: 2   Years of education: Not on file   Highest education level: Not on file  Occupational History   Occupation: Chief Executive Officer: [employer]  Tobacco Use    Smoking status: Former    Current packs/day: 0.00    Average packs/day: 3.0 packs/day for 30.0 years (90.0 ttl pk-yrs)    Types: Cigarettes    Start date: 06/30/1972    Quit date: 07/01/2002    Years since quitting: 21.5   Smokeless tobacco: Never  Vaping Use   Vaping status: Never Used  Substance and Sexual Activity   Alcohol use: No   Drug use: No   Sexual activity: Not Currently  Other Topics Concern   Not on file  Social History Narrative   Married, 2 children , retail buyer - predominantly commercial, 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   Usually walks 2 miles maybe 2-3 days a week. Just not a cold weather.   He is a former smoker, quit cold turkey after 3 pack per day for 60 pack years in 2004   Social Drivers of Health  Financial Resource Strain: Not on file  Food Insecurity: Patient Declined (06/14/2023)   Hunger Vital Sign    Worried About Running Out of Food in the Last Year: Patient declined    Ran Out of Food in the Last Year: Patient declined  Transportation Needs: Patient Declined (06/14/2023)   PRAPARE - Administrator, Civil Service (Medical): Patient declined    Lack of Transportation (Non-Medical): Patient declined  Physical Activity: Not on file  Stress: Not on file  Social Connections: Patient Declined (06/14/2023)   Social Connection and Isolation Panel    Frequency of Communication with Friends and Family: Patient declined    Frequency of Social Gatherings with Friends and Family: Patient declined    Attends Religious Services: Patient declined    Database Administrator or Organizations: Patient declined    Attends Banker Meetings: Patient declined    Marital Status: Patient declined  Intimate Partner Violence: Patient Declined (06/14/2023)   Humiliation, Afraid, Rape, and Kick questionnaire    Fear of Current or Ex-Partner: Patient declined    Emotionally Abused: Patient declined    Physically Abused:  Patient declined    Sexually Abused: Patient declined    Family History  Problem Relation Age of Onset   Prostate cancer Brother 6   Heart disease Brother        Before age 82   Hypertension Brother    Heart attack Brother    Aneurysm Father    Heart disease Father    Hypertension Father    Heart disease Mother        Before age 46   Hypertension Mother    Heart attack Mother    Heart disease Sister    Hypertension Sister    Heart attack Sister    Colon cancer Neg Hx    Esophageal cancer Neg Hx    Stomach cancer Neg Hx    Rectal cancer Neg Hx    Colon polyps Neg Hx     Current Outpatient Medications  Medication Sig Dispense Refill   acetaminophen  (TYLENOL ) 500 MG tablet Take 1,000 mg by mouth every 6 (six) hours as needed for mild pain (pain score 1-3) or headache.     amLODipine  (NORVASC ) 2.5 MG tablet Take 1 tablet (2.5 mg total) by mouth at bedtime. 90 tablet 3   ascorbic acid  (VITAMIN C ) 500 MG tablet Take 500 mg by mouth daily.     calcium -vitamin D (OSCAL WITH D) 500-5 MG-MCG tablet Take 1 tablet by mouth daily.     clopidogrel  (PLAVIX ) 75 MG tablet Take 1 tablet (75 mg total) by mouth daily. 90 tablet 3   docusate sodium  (COLACE) 100 MG capsule Take 1 capsule (100 mg total) by mouth daily as needed for mild constipation.  0   fluticasone  (FLONASE ) 50 MCG/ACT nasal spray Place 2 sprays into both nostrils daily as needed for rhinitis or allergies (or nasal congestion).     latanoprost  (XALATAN ) 0.005 % ophthalmic solution Place 1 drop into the left eye at bedtime.      losartan -hydrochlorothiazide  (HYZAAR ) 50-12.5 MG tablet Take 1 tablet by mouth daily. 90 tablet 3   metoprolol  succinate (TOPROL -XL) 50 MG 24 hr tablet Take 1 tablet (50 mg total) by mouth daily. Take with or immediately following a meal. 90 tablet 3   Multiple Vitamin (MULTIVITAMIN WITH MINERALS) TABS tablet Take 1 tablet by mouth daily. 30 tablet 0   Multiple Vitamins-Minerals (LUTEIN-ZEAXANTHIN) TABS  Take 1 tablet by  mouth daily.     omeprazole (PRILOSEC) 20 MG capsule Take 20 mg by mouth daily as needed (acid reflux).     rosuvastatin  (CRESTOR ) 20 MG tablet Take 1 tablet (20 mg total) by mouth at bedtime. 30 tablet 11   vitamin B-12 (CYANOCOBALAMIN ) 500 MCG tablet Take 1 tablet (500 mcg total) by mouth daily. 30 tablet 0   Vitamin D, Ergocalciferol, (DRISDOL) 1.25 MG (50000 UNIT) CAPS capsule Take 50,000 Units by mouth every 7 (seven) days.     No current facility-administered medications for this visit.    Allergies  Allergen Reactions   Avelox [Moxifloxacin Hcl In Nacl] Diarrhea   Moxifloxacin Diarrhea and Other (See Comments)   Cilostazol  Diarrhea   Cyclobenzaprine Other (See Comments)   Quinolones     Other reaction(s): Unknown   Ramipril Other (See Comments)    Other reaction(s): Unknown     REVIEW OF SYSTEMS:  Negative unless noted in HPI [X]  denotes positive finding, [ ]  denotes negative finding Cardiac  Comments:  Chest pain or chest pressure:    Shortness of breath upon exertion:    Short of breath when lying flat:    Irregular heart rhythm:        Vascular    Pain in calf, thigh, or hip brought on by ambulation:    Pain in feet at night that wakes you up from your sleep:     Blood clot in your veins:    Leg swelling:         Pulmonary    Oxygen at home:    Productive cough:     Wheezing:         Neurologic    Sudden weakness in arms or legs:     Sudden numbness in arms or legs:     Sudden onset of difficulty speaking or slurred speech:    Temporary loss of vision in one eye:     Problems with dizziness:         Gastrointestinal    Blood in stool:     Vomited blood:         Genitourinary    Burning when urinating:     Blood in urine:        Psychiatric    Major depression:         Hematologic    Bleeding problems:    Problems with blood clotting too easily:        Skin    Rashes or ulcers:        Constitutional    Fever or chills:       PHYSICAL EXAMINATION:  Vitals:   01/09/24 1229  BP: 121/76  Pulse: 76  Temp: 97.7 F (36.5 C)  TempSrc: Temporal  Weight: 163 lb 12.8 oz (74.3 kg)    General:  WDWN in NAD; vital signs documented above Gait: Not observed HENT: WNL, normocephalic Pulmonary: normal non-labored breathing Cardiac: regular HR Abdomen: soft, NT, no masses Skin: without rashes Vascular: Absent pedal pulses bilateral lower extremities Extremities: without ischemic changes, without Gangrene , without cellulitis; without open wounds;  Musculoskeletal: no muscle wasting or atrophy  Neurologic: A&O X 3 Psychiatric:  The pt has Normal affect.   Non-Invasive Vascular Imaging:   Widely patent bypass right lower extremity  ABI/TBIToday's ABIToday's TBIPrevious ABIPrevious TBI  +-------+-----------+-----------+------------+------------+  Right 0.97       0.52       1.17        0.58          +-------+-----------+-----------+------------+------------+  Left  0.75       0          0.45        0             +-------+-----------+-----------+------------+------------+    ASSESSMENT/PLAN:: 83 y.o. male here for follow up for surveillance of PAD  Subjectively, Mr. Robenson is doing well.  Admittedly his mobility has worsened due to back and hip pain.  Fortunately he is without rest pain or tissue loss of bilateral lower extremities.  Right leg bypass duplex demonstrates a widely patent bypass.  He has a known left SFA occlusion.  Would not consider intervention unless he were to develop rest pain or tissue loss of the left lower extremity.  He would like to repeat bypass duplex and ABIs in 6 months which I think is very reasonable.  He will continue his Plavix  and statin daily.  I asked him to walk for exercise.  He will notify the office with any questions or concerns.   Donnice Sender, PA-C [facility] [phone]  Clinic MD:   Sheree

## 2024-01-10 ENCOUNTER — Other Ambulatory Visit: Payer: Self-pay | Admitting: *Deleted

## 2024-01-10 DIAGNOSIS — I739 Peripheral vascular disease, unspecified: Secondary | ICD-10-CM

## 2024-01-10 DIAGNOSIS — I70213 Atherosclerosis of native arteries of extremities with intermittent claudication, bilateral legs: Secondary | ICD-10-CM

## 2024-02-11 ENCOUNTER — Ambulatory Visit: Admitting: Podiatry

## 2024-02-18 ENCOUNTER — Ambulatory Visit: Attending: Cardiology | Admitting: Cardiology

## 2024-02-18 ENCOUNTER — Encounter: Payer: Self-pay | Admitting: Cardiology

## 2024-02-18 VITALS — BP 112/68 | HR 73 | Ht 68.0 in | Wt 168.0 lb

## 2024-02-18 DIAGNOSIS — I6523 Occlusion and stenosis of bilateral carotid arteries: Secondary | ICD-10-CM | POA: Diagnosis not present

## 2024-02-18 DIAGNOSIS — N1831 Chronic kidney disease, stage 3a: Secondary | ICD-10-CM

## 2024-02-18 DIAGNOSIS — I70213 Atherosclerosis of native arteries of extremities with intermittent claudication, bilateral legs: Secondary | ICD-10-CM

## 2024-02-18 DIAGNOSIS — E785 Hyperlipidemia, unspecified: Secondary | ICD-10-CM

## 2024-02-18 DIAGNOSIS — I10 Essential (primary) hypertension: Secondary | ICD-10-CM

## 2024-02-18 NOTE — Progress Notes (Signed)
 " Cardiology Office Note:  .   Date:  02/18/2024  ID:  Lance Dillon, DOB 03/18/1940, MRN [id number] PCP: Clarice Nottingham, MD  [facility] Providers Cardiologist:  Gordy Bergamo, MD   History of Present Illness: .   Lance Dillon is a 84 y.o.  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 and event left femoral endarterectomy on 06/12/2023 and has a very long persistent left SFA occlusion with collateralization below the knee and one-vessel runoff in the form of peroneal artery.   Past medical history significant for hypertension, hyperlipidemia, chronic stage IIIa creatinine disease, chronic anemia and remote heavy tobacco use disorder, quit smoking in 2004.  Except for mild symptoms of claudication and mostly being bothered by back pain he remains asymptomatic without chest pain or dyspnea.  He still continues to exercise regularly and is accompanied by his wife.    Discussed the use of AI scribe software for clinical note transcription with the patient, who gave verbal consent to proceed.  History of Present Illness Lance Dillon is an 84 year old male with peripheral arterial disease and atrial fibrillation who presents for cardiovascular follow-up. He is accompanied by his wife.  His legs remain bothersome, mainly in the calves, but he feels they are overall improved. He is able to walk and use his bike and stays active with exercise.  He has stable shortness of breath with walking. He attributes this to his back and denies chest pain.  His current medications are  amlodipine  2.5 mg daily, losartan  HCT 50/12.5 mg daily, and metoprolol  succinate 50 mg daily for hypertension. He takes Crestor  20 mg daily for cholesterol and Plavix  75 mg daily for peripheral arterial disease.  He notes cold hands, which he attributes to metoprolol . He has stage 3a chronic kidney disease that has been stable.   Cardiac Studies relevent.    ABI 01/09/2024 comparison 06/05/2023: Right ABI 0.97, no change from previous Left ABI 0.75 improved from 0.45 previously.  Peripheral arteriogram 06/07/2023:                             06/12/23 Vein patch angioplasty and atherectomy of CFA and DFA   Myocardial perfusion scan 04/05/2021: Motion artifact in the inferior wall otherwise normal perfusion, EF 79%.  Low risk.  Echocardiogram 11/07/2022: Normal LV systolic function, moderate LVH. Moderate to severe mitral and calcification.  EKG:      Labs   Lab Results  Component Value Date   CHOL 107 06/13/2023   HDL 42 06/13/2023   LDLCALC 48 06/13/2023   TRIG 83 06/13/2023   CHOLHDL 2.5 06/13/2023   Lipoprotein (a)  Date/Time Value Ref Range Status  03/22/2021 10:48 AM 16.6 <75.0 nmol/L Final    Comment:    Note:  Values greater than or equal to 75.0 nmol/L may        indicate an independent risk factor for CHD,        but must be evaluated with caution when applied        to non-Caucasian populations due to  the        influence of genetic factors on Lp(a) across        ethnicities.     Recent Labs    06/12/23 0620 06/12/23 1100 06/13/23 0346 06/14/23 0932  NA 139  --  138 137  K 4.4  --  4.2 4.0  CL 103  --  105 101  CO2 27  --  22 26  GLUCOSE 117*  --  144* 105*  BUN 24*  --  25* 30*  CREATININE 1.38* 1.32* 1.60* 1.53*  CALCIUM  10.0  --  8.9 8.7*  GFRNONAA 51* 54* 43* 45*    Lab Results  Component Value Date   ALT 23 06/12/2023   AST 25 06/12/2023   ALKPHOS 52 06/12/2023   BILITOT 1.0 06/12/2023      Latest Ref Rng & Units 06/13/2023    3:46 AM  06/12/2023   11:00 AM 06/12/2023    6:20 AM  CBC  WBC 4.0 - 10.5 K/uL 10.7  4.0  6.1   Hemoglobin 13.0 - 17.0 g/dL 88.4  87.5  85.4   Hematocrit 39.0 - 52.0 % 33.1  36.2  42.0   Platelets 150 - 400 K/uL 124  117  144     ROS  Review of Systems  Cardiovascular:  Positive for claudication (stable) and dyspnea on exertion (stable). Negative for chest pain and leg swelling.   Physical Exam:   VS:  BP 112/68 (BP Location: Left Arm, Patient Position: Sitting, Cuff Size: Normal)   Pulse 73   Ht 5' 8 (1.727 m)   Wt 168 lb (76.2 kg)   SpO2 96%   BMI 25.54 kg/m    Wt Readings from Last 3 Encounters:  02/18/24 168 lb (76.2 kg)  01/09/24 163 lb 12.8 oz (74.3 kg)  11/14/23 164 lb (74.4 kg)    BP Readings from Last 3 Encounters:  02/18/24 112/68  01/09/24 121/76  11/14/23 118/72   Physical Exam Neck:     Vascular: No carotid bruit or JVD.  Cardiovascular:     Rate and Rhythm: Normal rate and regular rhythm.     Pulses: Intact distal pulses.          Dorsalis pedis pulses are 0 on the right side and 0 on the left side.       Posterior tibial pulses are 0 on the right side and 0 on the left side.     Heart sounds: Normal heart sounds. No murmur heard.    No gallop.     Comments: CRT <3 Sec Pulmonary:     Effort: Pulmonary effort is normal.     Breath sounds: Normal breath sounds.  Abdominal:     General: Bowel sounds are normal.     Palpations: Abdomen is soft.  Musculoskeletal:     Right lower leg: No edema.     Left lower leg: No edema.     ASSESSMENT AND PLAN: .      ICD-10-CM   1. Atherosclerosis of native artery of both lower extremities with intermittent claudication  I70.213     2. Stage 3a chronic kidney disease (HCC)  N18.31     3. Primary hypertension  I10     4. Hyperlipidemia LDL goal <70  E78.5     5. Carotid occlusion, bilateral  I65.23      Assessment & Plan Peripheral arterial disease with intermittent claudication, bilateral legs L>R but stable  and improved symptoms Peripheral arterial disease  with intermittent claudication primarily affecting the calves. Circulation in the left leg has improved with an ABI of 0.75, previously 0.45. Right leg circulation is normal. No major blockages in the heart. Right femoral popliteal bypass appears functioning well. - Continue current medications including Plavix  75 mg once daily. - Scheduled next ABI in 6 months to a year. Follows Vascular surgery - He has history of bilateral carotid artery occlusion with reconstitution distally and remains asymptomatic.  Stage 3a chronic kidney disease Well-managed kidney function. Blood pressure is well controlled, reducing concern for progression. - Continue current blood pressure management. - Presently tolerating losartan  HCT 50/12.5 mg daily  Primary hypertension Well controlled with current medication regimen. - Continue current antihypertensive medications: amlodipine  2.5 mg once daily, losartan  HCT 50/12.5 mg once daily, and metoprolol  succinate 50 mg once daily.  Hyperlipidemia Well managed with current medication regimen. - Continue current medication: Crestor  20 mg once daily. - Labs reviewed LDL is at goal, 48.  Follow up: 1 Year for PAD and Hypertension. Hyperchol  Signed,  Gordy Bergamo, MD, [facility] 02/18/2024, 5:47 PM [facility] [address] [place], [place] [address] Phone: [phone]. Fax:  [phone]  "

## 2024-02-18 NOTE — Patient Instructions (Addendum)
 Medication Instructions:  Your physician recommends that you continue on your current medications as directed. Please refer to the Current Medication list given to you today.  *If you need a refill on your cardiac medications before your next appointment, please call your pharmacy*    Follow-Up: At [facility], you and your health needs are our priority.  As part of our continuing mission to provide you with exceptional heart care, our providers are all part of one team.  This team includes your primary Cardiologist (physician) and Advanced Practice Providers or APPs (Physician Assistants and Nurse Practitioners) who all work together to provide you with the care you need, when you need it.  Your next appointment:   1 year(s)  Provider:   Gordy Bergamo, MD    We recommend signing up for the patient portal called MyChart.  Sign up information is provided on this After Visit Summary.  MyChart is used to connect with patients for Virtual Visits (Telemedicine).  Patients are able to view lab/test results, encounter notes, upcoming appointments, etc.  Non-urgent messages can be sent to your provider as well.   To learn more about what you can do with MyChart, go to [email or web address].         We recommend signing up for the patient portal called MyChart.  Patients are able to view lab/test results, encounter notes, upcoming appointments, etc.  Non-urgent messages can be sent to your provider as well, go to [email or web address].

## 2024-02-19 ENCOUNTER — Telehealth: Payer: Self-pay | Admitting: Cardiology

## 2024-02-19 NOTE — Telephone Encounter (Signed)
 Wife Lysbeth) stated the office visit notes from patient's visit with Dr. Ladona yesterday needs to be corrected as the below remarks were regarding her not the patient.  He has infrequent atrial fibrillation episodes that sometimes last a few hours.  He has stage 3a chronic kidney disease that has been stable.  He also has a past breast cancer diagnosis with ongoing annual follow-up.  Wife wants a copy of the corrected version sent to her in MyChart.

## 2024-02-28 ENCOUNTER — Other Ambulatory Visit: Payer: Self-pay | Admitting: Cardiology

## 2024-03-05 ENCOUNTER — Ambulatory Visit: Admitting: Podiatry

## 2024-03-19 ENCOUNTER — Ambulatory Visit: Admitting: Podiatry

## 2024-07-09 ENCOUNTER — Ambulatory Visit (HOSPITAL_COMMUNITY)

## 2024-07-09 ENCOUNTER — Ambulatory Visit
